# Patient Record
Sex: Female | Born: 1955 | Race: White | Hispanic: No | Smoking: Former smoker
Health system: Southern US, Community
[De-identification: ages and names within clinical notes are randomized; demographics above are authoritative.]

## PROBLEM LIST (undated history)

## (undated) DIAGNOSIS — I313 Pericardial effusion (noninflammatory): Secondary | ICD-10-CM

## (undated) DIAGNOSIS — I3139 Other pericardial effusion (noninflammatory): Secondary | ICD-10-CM

## (undated) DIAGNOSIS — E785 Hyperlipidemia, unspecified: Secondary | ICD-10-CM

## (undated) DIAGNOSIS — I219 Acute myocardial infarction, unspecified: Secondary | ICD-10-CM

## (undated) DIAGNOSIS — I10 Essential (primary) hypertension: Secondary | ICD-10-CM

## (undated) DIAGNOSIS — E039 Hypothyroidism, unspecified: Secondary | ICD-10-CM

## (undated) DIAGNOSIS — I4891 Unspecified atrial fibrillation: Principal | ICD-10-CM

## (undated) DIAGNOSIS — J449 Chronic obstructive pulmonary disease, unspecified: Secondary | ICD-10-CM

## (undated) DIAGNOSIS — S22000A Wedge compression fracture of unspecified thoracic vertebra, initial encounter for closed fracture: Secondary | ICD-10-CM

## (undated) DIAGNOSIS — E119 Type 2 diabetes mellitus without complications: Secondary | ICD-10-CM

## (undated) DIAGNOSIS — I5032 Chronic diastolic (congestive) heart failure: Secondary | ICD-10-CM

## (undated) DIAGNOSIS — K439 Ventral hernia without obstruction or gangrene: Secondary | ICD-10-CM

## (undated) HISTORY — DX: Chronic diastolic (congestive) heart failure: I50.32

## (undated) HISTORY — PX: TRACHEAL SURGERY: SHX1096

## (undated) HISTORY — PX: HERNIA REPAIR: SHX51

## (undated) HISTORY — PX: ABDOMINAL HYSTERECTOMY: SHX81

## (undated) HISTORY — PX: ABDOMINAL SURGERY: SHX537

## (undated) HISTORY — PX: STOMACH SURGERY: SHX791

---

## 2002-01-21 ENCOUNTER — Encounter: Payer: Self-pay | Admitting: Internal Medicine

## 2002-01-22 ENCOUNTER — Inpatient Hospital Stay (HOSPITAL_COMMUNITY): Admission: EM | Admit: 2002-01-22 | Discharge: 2002-01-24 | Payer: Self-pay | Admitting: Internal Medicine

## 2002-01-22 ENCOUNTER — Encounter: Payer: Self-pay | Admitting: Internal Medicine

## 2003-05-03 ENCOUNTER — Encounter: Payer: Self-pay | Admitting: Obstetrics & Gynecology

## 2003-05-03 ENCOUNTER — Ambulatory Visit (HOSPITAL_COMMUNITY): Admission: RE | Admit: 2003-05-03 | Discharge: 2003-05-03 | Payer: Self-pay | Admitting: Obstetrics & Gynecology

## 2003-10-30 ENCOUNTER — Inpatient Hospital Stay (HOSPITAL_COMMUNITY): Admission: RE | Admit: 2003-10-30 | Discharge: 2003-11-01 | Payer: Self-pay | Admitting: Obstetrics & Gynecology

## 2003-11-14 ENCOUNTER — Inpatient Hospital Stay (HOSPITAL_COMMUNITY): Admission: RE | Admit: 2003-11-14 | Discharge: 2003-11-15 | Payer: Self-pay | Admitting: Obstetrics & Gynecology

## 2007-04-06 ENCOUNTER — Observation Stay (HOSPITAL_COMMUNITY): Admission: AD | Admit: 2007-04-06 | Discharge: 2007-04-07 | Payer: Self-pay | Admitting: General Surgery

## 2007-10-03 ENCOUNTER — Ambulatory Visit (HOSPITAL_COMMUNITY): Admission: RE | Admit: 2007-10-03 | Discharge: 2007-10-03 | Payer: Self-pay | Admitting: Internal Medicine

## 2008-08-20 ENCOUNTER — Emergency Department (HOSPITAL_COMMUNITY): Admission: EM | Admit: 2008-08-20 | Discharge: 2008-08-20 | Payer: Self-pay | Admitting: Emergency Medicine

## 2008-08-27 ENCOUNTER — Ambulatory Visit (HOSPITAL_COMMUNITY): Admission: RE | Admit: 2008-08-27 | Discharge: 2008-08-27 | Payer: Self-pay | Admitting: General Surgery

## 2008-10-04 ENCOUNTER — Ambulatory Visit (HOSPITAL_COMMUNITY): Admission: RE | Admit: 2008-10-04 | Discharge: 2008-10-04 | Payer: Self-pay | Admitting: Internal Medicine

## 2009-10-07 ENCOUNTER — Ambulatory Visit (HOSPITAL_COMMUNITY): Admission: RE | Admit: 2009-10-07 | Discharge: 2009-10-07 | Payer: Self-pay | Admitting: Internal Medicine

## 2010-09-02 ENCOUNTER — Ambulatory Visit (HOSPITAL_COMMUNITY): Admission: RE | Admit: 2010-09-02 | Discharge: 2010-09-02 | Payer: Self-pay | Admitting: Internal Medicine

## 2010-09-23 ENCOUNTER — Ambulatory Visit: Payer: Self-pay | Admitting: Internal Medicine

## 2010-09-23 DIAGNOSIS — J449 Chronic obstructive pulmonary disease, unspecified: Secondary | ICD-10-CM | POA: Insufficient documentation

## 2010-09-23 DIAGNOSIS — L03319 Cellulitis of trunk, unspecified: Secondary | ICD-10-CM

## 2010-09-23 DIAGNOSIS — F329 Major depressive disorder, single episode, unspecified: Secondary | ICD-10-CM | POA: Insufficient documentation

## 2010-09-23 DIAGNOSIS — J45909 Unspecified asthma, uncomplicated: Secondary | ICD-10-CM | POA: Insufficient documentation

## 2010-09-23 DIAGNOSIS — E039 Hypothyroidism, unspecified: Secondary | ICD-10-CM | POA: Insufficient documentation

## 2010-09-23 DIAGNOSIS — I10 Essential (primary) hypertension: Secondary | ICD-10-CM | POA: Insufficient documentation

## 2010-09-23 DIAGNOSIS — E119 Type 2 diabetes mellitus without complications: Secondary | ICD-10-CM | POA: Insufficient documentation

## 2010-09-23 DIAGNOSIS — R109 Unspecified abdominal pain: Secondary | ICD-10-CM | POA: Insufficient documentation

## 2010-09-23 DIAGNOSIS — J4489 Other specified chronic obstructive pulmonary disease: Secondary | ICD-10-CM | POA: Insufficient documentation

## 2010-09-23 DIAGNOSIS — M549 Dorsalgia, unspecified: Secondary | ICD-10-CM | POA: Insufficient documentation

## 2010-09-23 DIAGNOSIS — L02219 Cutaneous abscess of trunk, unspecified: Secondary | ICD-10-CM

## 2010-09-24 ENCOUNTER — Encounter: Payer: Self-pay | Admitting: Internal Medicine

## 2010-09-26 ENCOUNTER — Encounter: Payer: Self-pay | Admitting: Gastroenterology

## 2010-10-09 ENCOUNTER — Ambulatory Visit (HOSPITAL_COMMUNITY): Admission: RE | Admit: 2010-10-09 | Discharge: 2010-10-09 | Payer: Self-pay | Admitting: Internal Medicine

## 2010-12-09 ENCOUNTER — Emergency Department (HOSPITAL_COMMUNITY)
Admission: EM | Admit: 2010-12-09 | Discharge: 2010-12-09 | Payer: Self-pay | Source: Home / Self Care | Admitting: Emergency Medicine

## 2010-12-10 LAB — URINALYSIS, ROUTINE W REFLEX MICROSCOPIC
Bilirubin Urine: NEGATIVE
Hgb urine dipstick: NEGATIVE
Ketones, ur: NEGATIVE mg/dL
Nitrite: NEGATIVE
Protein, ur: NEGATIVE mg/dL
Specific Gravity, Urine: 1.01 (ref 1.005–1.030)
Urine Glucose, Fasting: NEGATIVE mg/dL
Urobilinogen, UA: 0.2 mg/dL (ref 0.0–1.0)
pH: 6 (ref 5.0–8.0)

## 2010-12-10 LAB — CBC
HCT: 40.2 % (ref 36.0–46.0)
Hemoglobin: 13.7 g/dL (ref 12.0–15.0)
MCH: 25.8 pg — ABNORMAL LOW (ref 26.0–34.0)
MCHC: 34.1 g/dL (ref 30.0–36.0)
MCV: 75.8 fL — ABNORMAL LOW (ref 78.0–100.0)
Platelets: 210 10*3/uL (ref 150–400)
RBC: 5.3 MIL/uL — ABNORMAL HIGH (ref 3.87–5.11)
RDW: 16.6 % — ABNORMAL HIGH (ref 11.5–15.5)
WBC: 10.1 10*3/uL (ref 4.0–10.5)

## 2010-12-10 LAB — DIFFERENTIAL
Basophils Absolute: 0.1 10*3/uL (ref 0.0–0.1)
Basophils Relative: 1 % (ref 0–1)
Eosinophils Absolute: 0.2 10*3/uL (ref 0.0–0.7)
Eosinophils Relative: 2 % (ref 0–5)
Lymphocytes Relative: 15 % (ref 12–46)
Lymphs Abs: 1.6 10*3/uL (ref 0.7–4.0)
Monocytes Absolute: 0.7 10*3/uL (ref 0.1–1.0)
Monocytes Relative: 6 % (ref 3–12)
Neutro Abs: 7.6 10*3/uL (ref 1.7–7.7)
Neutrophils Relative %: 76 % (ref 43–77)

## 2010-12-10 LAB — BASIC METABOLIC PANEL
BUN: 17 mg/dL (ref 6–23)
CO2: 25 mEq/L (ref 19–32)
Calcium: 9.2 mg/dL (ref 8.4–10.5)
Chloride: 100 mEq/L (ref 96–112)
Creatinine, Ser: 0.79 mg/dL (ref 0.4–1.2)
GFR calc Af Amer: >60 mL/min (ref >60)
GFR calc non Af Amer: >60 mL/min (ref >60)
Glucose, Bld: 124 mg/dL — ABNORMAL HIGH (ref 70–99)
Potassium: 4.7 mEq/L (ref 3.5–5.1)
Sodium: 135 mEq/L (ref 135–145)

## 2010-12-10 LAB — POCT CARDIAC MARKERS
CKMB, poc: 1.1 ng/mL (ref 1.0–8.0)
CKMB, poc: 1 ng/mL (ref 1.0–8.0)
Myoglobin, poc: 50.7 ng/mL (ref 12–200)
Myoglobin, poc: 63.5 ng/mL (ref 12–200)
Troponin i, poc: <0.05 ng/mL (ref 0.00–0.09)
Troponin i, poc: <0.05 ng/mL (ref 0.00–0.09)

## 2010-12-10 LAB — D-DIMER, QUANTITATIVE: D-Dimer, Quant: 2.07 ug/mL-FEU — ABNORMAL HIGH (ref 0.00–0.48)

## 2010-12-23 NOTE — Assessment & Plan Note (Signed)
Summary: DRAINAGE FROM INCISION(HERNIA REPAIR)   Visit Type:  Initial Consult Referring Provider:  Dr Sherwood Gambler Primary Care Provider:  Dr Sherwood Gambler  Chief Complaint:  drainage from incision from 2008 surgery.  History of Present Illness: 55 y/o caucasian female w/ 2 month hx of abd pain at site of previous ventral hernia repair.  c/o increasin abd girth, stretch  marks & serosanguinous & yellowish purulent drainage.  Was started on bactrim two times a day x 10 days in 9/11.  Hx recurrent incisional hernia repair 04/06/2007 Dr Lovell Sheehan, followed by re-do in 08/27/2008.  Never had colonscopy.  c/o intermittant "sticking" sharp & dull pain.  9/10, lasts 20-30 min.  c/o nausea without emesis & needs to urinate w/ episodes.  Episodes q couple hrs throughout every day.   BM 1-2 per day or every other day.  Denies rectal bleeding or melena.  Denies fever or chills.  Occ heartburn, takes prevacid as needed 3/month.    Worse w/ spicy foods.  Denies dysphagia or odynophagia. Blood sugars well-controlled.   See CT below.... CT abd/pelvis w/ oral/IV contrast-> Scattered atherosclerotic calcifications aorta and branch vessels, scattered normal-sized periportal and right cardiophrenic angle lymph nodes, Ventral hernia seen on previous exam appears slightly larger on   current study and contains nonobstructed small bowel loops as well as margin of transverse colon.  Small fluid collection identified external to the hernia sac in the anterior abdominal wall in the midline, appears to extend to skin surface infraumbilical (3.4 x 2.1 cm axial dimensions),   appears contiguous with a thin collection 11 mm thick and 8.6 cm diameter as it courses superiorly. Demonstrates an enhancing margin and is slightly larger versus prior study.    Current Problems (verified): 1)  COPD  (ICD-496) 2)  Back Pain, Chronic  (ICD-724.5) 3)  Depression  (ICD-311) 4)  Dm  (ICD-250.00) 5)  Hypertension  (ICD-401.9) 6)  Hypothyroidism   (ICD-244.9) 7)  Asthma  (ICD-493.90) 8)  Abdominal Pain  (ICD-789.00)  Current Medications (verified): 1)  Altace 10 Mg Caps (Ramipril) .... Once Daily 2)  Synthroid 112 Mcg Tabs (Levothyroxine Sodium) .... Once Daily 3)  Zocor 10 Mg Tabs (Simvastatin) .... Once Daily 4)  Albuterol Sulfate 4 Mg Tabs (Albuterol Sulfate) .... Once Daily 5)  Xanax 1 Mg Tabs (Alprazolam) .... Q 3- 4 Hours As Needed 6)  Theophylline Cr 300 Mg Xr12h-Tab (Theophylline) .... Two Times A Day 7)  Proventil Hfa 108 (90 Base) Mcg/act Aers (Albuterol Sulfate) .... Q 4 Hours As Needed 8)  Allegra-D Allergy & Congestion 60-120 Mg Xr12h-Tab (Fexofenadine-Pseudoephedrine) .... Once Daily 9)  Hydrocodone-Acetaminophen 10-500 Mg Tabs (Hydrocodone-Acetaminophen) .... Q 4 Hours As Needed  Allergies (verified): 1)  ! Pcn  Past History:  Past Medical History: BACK PAIN, CHRONIC (ICD-724.5) DEPRESSION (ICD-311) DM (ICD-250.00) HYPERTENSION (ICD-401.9) HYPOTHYROIDISM (ICD-244.9) ASTHMA/COPD  Past Surgical History: complete hysterectomy 2004 ventral hernia x 2 (see HPI) s/p wound dehiss 2004  Family History: Father: (deceased ) ? unsure Mother: (deceased 11) DM CAD Siblings:  1 brother w/ metastatic ca ?e tiology (deceased 34) 1 brother SLE 1 sister DM. CVA, asthma  Social History: unemployed tobacco 60pkyr hx (1 1/2 pk/day) Alcohol Use - yes, 2-3- 12oz bud light Illicit Drug Use - no Patient gets regular exercise, walksDoes Patient Exercise:  yes Drug Use:  no  Review of Systems General:  Complains of sleep disorder; denies fever, chills, sweats, anorexia, fatigue, weakness, malaise, and weight loss. CV:  Denies chest pains, angina, palpitations, syncope,  dyspnea on exertion, orthopnea, PND, peripheral edema, and claudication. Resp:  Complains of cough, sputum, and wheezing; hx asthma/COPD uses inhalers. GI:  Denies difficulty swallowing, pain on swallowing, nausea, vomiting blood, jaundice, change in  bowel habits, bloody BM's, black BMs, and fecal incontinence. GU:  Denies urinary burning, blood in urine, nocturnal urination, urinary frequency, and urinary incontinence. MS:  Denies joint pain / LOM, joint swelling, joint stiffness, joint deformity, low back pain, muscle weakness, muscle cramps, muscle atrophy, leg pain at night, leg pain with exertion, and shoulder pain / LOM hand / wrist pain (CTS). Derm:  Denies rash, itching, dry skin, hives, moles, warts, and unhealing ulcers. Psych:  Complains of depression; denies anxiety, memory loss, suicidal ideation, hallucinations, paranoia, phobia, and confusion; major depression since daughter died, talks w/ pastor, not on meds. Heme:  Denies bruising, bleeding, and enlarged lymph nodes.  Vital Signs:  Patient profile:   55 year old female Height:      63 inches Weight:      197 pounds BMI:     35.02 Temp:     97.9 degrees F oral Pulse rate:   84 / minute BP sitting:   112 / 74  (left arm) Cuff size:   regular  Vitals Entered By: Hendricks Limes LPN (September 23, 2010 11:31 AM)  Physical Exam  General:  obese.  well nourished, no acute distress. Head:  Normocephalic and atraumatic. Eyes:  sclera clear, no icterus. Ears:  Normal auditory acuity. Nose:  No deformity, discharge,  or lesions. Mouth:  No deformity or lesions, dentition normal. Neck:  Supple; no masses or thyromegaly. Lungs:  insp/exp wheezes bilateral.  NAD Heart:  Regular rate and rhythm; no murmurs, rubs,  or bruits. Abdomen:  Just inferior to umbilicus and right of midline scar is scabbing around erythematous raised base.  No active d/c.  To the left of this lesion is a warm, firm, non-erythematous density that is moderately tender.normal bowel sounds, obese, without guarding, without rebound, and no hepatomegally or splenomegaly.   Msk:  Symmetrical with no gross deformities. Normal posture. Pulses:  Normal pulses noted. Extremities:  No clubbing, cyanosis, edema or  deformities noted. Neurologic:  Alert and  oriented x4;  grossly normal neurologically. Skin:  Intact without significant lesions or rashes. Cervical Nodes:  No significant cervical adenopathy. Psych:  Alert and cooperative. Normal mood and affect.  Impression & Recommendations:  Problem # 1:  ABDOMINAL PAIN (ICD-789.00) 55 y/o caucasian female w/ abdominal pain at the site of previous hernia repairs.  CT shows abscess/fluid collections.  Will have Dr Jena Gauss review CT films.  I suspect Ms. Skoda will need surgical referral.  She has never had a colonoscopy & will need this as well.  Problem # 2:  ABDOMINAL ABSCESS (ICD-682.2) See #1  Other Orders: Consultation Level IV (16109)  Patient Instructions: 1)  Referral to Mental Health regarding depression  *Spent 1 hr with patient during office visit reviewing CT, discussing plan, advising referral regarding depression  Appended Document: DRAINAGE FROM INCISION(HERNIA REPAIR) RMR reviewed CT w/ Dr Onalee Hua Call (radiologist).  Recommends surgical referral Dr Derrell Lolling [facility]) re: recurrent ventral hernia w/ abscess.  Please arrange.  Thanks  Appended Document: DRAINAGE FROM INCISION(HERNIA REPAIR) I spoke with pt about the ability to pay some money up front for surgical visit.She said she is unable to pay any money up front.Marland KitchenMarland KitchenI spoke with Blanca([facility]) about patient's financial situation and she stated she would speak to their billing department  and get back to me.  Appended Document: DRAINAGE FROM INCISION(HERNIA REPAIR) Surgical referral faxed to Dr Dixon Boos office..Pt has an appt. 09/25/10@3 :00.Marland KitchenMarland KitchenPt aware of appt.

## 2010-12-23 NOTE — Letter (Signed)
Summary: RELEASE OF RECORDS TO DR WYATT  RELEASE OF RECORDS TO DR Lindie Spruce   Imported By: Diana Eves 09/26/2010 13:08:30  _____________________________________________________________________  External Attachment:    Type:   Image     Comment:   External Document

## 2010-12-23 NOTE — Letter (Signed)
Summary: OP REPORT EGD/2003  OP REPORT EGD/2003   Imported By: Rexene Alberts 09/24/2010 10:02:55  _____________________________________________________________________  External Attachment:    Type:   Image     Comment:   External Document

## 2011-04-07 NOTE — Op Note (Signed)
NAMEHAZYL, Joanna Perez              ACCOUNT NO.:  [id number]   MEDICAL RECORD NO.:  [id number]          PATIENT TYPE:  AMB   LOCATION:  DAY                           FACILITY:  [facility]   PHYSICIAN:  Dalia Heading, M.D.  DATE OF BIRTH:  Jun 29, 1956   DATE OF PROCEDURE:  08/27/2008  DATE OF DISCHARGE:                               OPERATIVE REPORT   PREOPERATIVE DIAGNOSIS:  Poorly healing abdominal wound, draining sinus.   POSTOPERATIVE DIAGNOSIS:  Poorly healing abdominal wound, draining  sinus.   PROCEDURE:  Abdominal wound exploration with debridement.   SURGEON:  Dalia Heading, MD   ANESTHESIA:  General endotracheal.   INDICATIONS:  The patient is a 55 year old white female status post an  incisional herniorrhaphy with mesh in May 2008, who now presents with  recurrence of a draining sinus in the lower portion of her abdominal  wound.  CT scan of the abdomen and pelvis done preoperatively reveals no  tracking of the sinus into the intra-abdominal cavity.  The patient now  comes to the operating room for a wound exploration.  The risks and  benefits of the procedure were fully explained to the patient, gave  informed consent.   PROCEDURE NOTE:  The patient was placed in the supine position.  After  general anesthesia was administered, the abdomen was prepped and draped  using the usual sterile technique with Betadine.  Surgical site  confirmation was performed.   An elliptical incision was made around the draining sinus along the  midline.  The sinus tract was then followed down to the mesh.  Several  sutures were present in this region and these were removed without  difficulty.  The mesh was noted to be intact.  No purulent fluid was  noted.  An aerobic culture was taken over the mesh and sent to  microbiology.  Any granulomatous tissue was debrided using a curette.  The wound was then copiously irrigated with gentamicin and normal  saline.  The patient had received  vancomycin preoperatively.  The  subcutaneous scar tissue was reapproximated using 2-0 Monocryl  interrupted sutures in order to cover the mesh.  The skin was closed  using 3-0 Prolene vertical mattress sutures.  Sensorcaine 0.5% was  instilled into the surrounding wound.  Betadine ointment and dry sterile  dressings were applied.   All tape and needle counts were correct at the end of the procedure.  The patient was awakened and transferred to PACU in stable condition.   COMPLICATIONS:  None.   SPECIMEN:  None.   BLOOD LOSS:  Minimal.      Dalia Heading, M.D.  Electronically Signed     MAJ/MEDQ  D:  08/27/2008  T:  08/27/2008  Job:  [id number]   cc:   Madelin Rear. Sherwood Gambler, MD  Fax: [phone]

## 2011-04-07 NOTE — H&P (Signed)
Joanna Perez, Joanna Perez              ACCOUNT NO.:  [id number]   MEDICAL RECORD NO.:  [id number]          PATIENT TYPE:  AMB   LOCATION:  DAY                           FACILITY:  [facility]   PHYSICIAN:  Dalia Heading, M.D.  DATE OF BIRTH:  [birth year]   DATE OF ADMISSION:  04/06/2007  DATE OF DISCHARGE:  LH                              HISTORY & PHYSICAL   CHIEF COMPLAINT:  Recurrent incisional hernia.   HISTORY OF PRESENT ILLNESS:  The patient is a 55 year old old white  female who is referred for evaluation treatment of a recurrent  incisional hernia.  She started having pain and swelling to the right of  her lower midline incision last month.  No nausea or vomiting have been  noted.  The hernia is made worse with straining.  She is status post  incisional herniorrhaphy in 2004.   PAST MEDICAL HISTORY:  1. COPD.  2. Hypertension.  3. Non-insulin-dependent diabetes mellitus.   PAST SURGICAL HISTORY:  As noted above, hysterectomy.   CURRENT MEDICATIONS:  1. Altace 10 mg p.o. daily.  2. Actos 30 mg p.o. daily.  3. Theo-Dur 300 mg p.o. daily.  4. Lipitor 10 mg p.o. daily.  5. Vicodin p.r.n. pain.  6. Xanax.  7. Synthroid.   ALLERGIES:  No known drug allergies.   REVIEW OF SYSTEMS:  The patient continues to smoke half pack cigarettes  a day.  She denies any alcohol use.  She denies any other  cardiopulmonary difficulties or bleeding disorders.   PHYSICAL EXAMINATION:  GENERAL:  The patient is a well-developed, well-  nourished white female in no acute distress.  LUNGS:  Clear to auscultation with equal breath sounds bilaterally.  HEART:  Reveals regular rate and rhythm without S3, S4, or murmurs.  ABDOMEN:  Soft, nondistended.  She has some tenderness noted along the  right side of lower midline incision.  A large incisional hernia is  present to the right upper lobe midline incision.   IMPRESSION:  Recurrent incisional hernia.   PLAN:  The patient is scheduled for a  recurrent incisional herniorrhaphy  with mesh on Apr 06, 2007.  The risks and benefits of the procedure  including bleeding, infection, and the possibility of recurrence of the  hernia were fully explained to the patient, gave informed consent.      Dalia Heading, M.D.  Electronically Signed     MAJ/MEDQ  D:  03/31/2007  T:  04/01/2007  Job:  [id number]   cc:   Madelin Rear. Sherwood Gambler, MD  Fax: [phone]

## 2011-04-07 NOTE — Op Note (Signed)
Joanna Perez, Joanna Perez              ACCOUNT NO.:  [id number]   MEDICAL RECORD NO.:  [id number]          PATIENT TYPE:  INP   LOCATION:  A304                          FACILITY:  [facility]   PHYSICIAN:  Dalia Heading, M.D.  DATE OF BIRTH:  10-16-1956   DATE OF PROCEDURE:  04/06/2007  DATE OF DISCHARGE:                               OPERATIVE REPORT   PREOPERATIVE DIAGNOSIS:  Recurrent incisional hernia.   POSTOPERATIVE DIAGNOSIS:  Recurrent incisional hernia.   PROCEDURE:  Recurrent incisional herniorrhaphy with mesh.   SURGEON:  Dalia Heading, MD   ANESTHESIA:  General endotracheal.   INDICATIONS:  The patient is a 55 year old white female who presents  with a recurrent incisional hernia along the lower midline incision.  She last had incisional herniorrhaphy with mesh in 2004.  The risks and  benefits of the procedure including bleeding, infection, and recurrence  of the hernia, were fully explained to the patient, who gave informed  consent.   PROCEDURE NOTE:  The patient was placed in the supine position.  After  induction of general endotracheal anesthesia, the abdomen was prepped  and draped using the usual sterile technique with Betadine.  Surgical  site confirmation was performed.   The previous lower midline surgical scar was excised without difficulty.  The dissection was taken down to the hernia sac.  A hernia sac was noted  to be emanating along the right side of the previous repair.  It  appeared that the previously-placed mesh was intact and had just  separated from the right rectus muscle.  The hernia sac was freed away  from the subcutaneous tissue down to the fascial edge.  It was then  inverted after several small bowel adhesions were lysed without  difficulty.  It was sewn back together using 0 chromic gut running  suture.  A 3 x 6 piece of polypropylene mesh was then secured to the  right rectus muscle and to the left where the previous mesh was placed  and  secured using both a running 0 Novofil suture as well as interrupted  0 Prolene sutures.  A tension-free repair was noted.  The subcutaneous  layer was reapproximated using 2-0 Vicryl interrupted sutures.  The skin  was closed using 3-0 Vicryl interrupted sutures and staples.  Sensorcaine 0.5% was instilled into the surrounding wound.  Betadine  ointment and a dry sterile dressing were applied.   All tape and needle counts were correct and the end of the procedure.  The patient was extubated in the operating room went back to recovery  room awake in stable condition.   COMPLICATIONS:  None.   SPECIMEN:  None.   BLOOD LOSS:  Minimal.      Dalia Heading, M.D.  Electronically Signed     MAJ/MEDQ  D:  04/06/2007  T:  04/06/2007  Job:  [id number]   cc:   Madelin Rear. Sherwood Gambler, MD  Fax: [phone]

## 2011-04-07 NOTE — H&P (Signed)
Joanna Perez, Joanna Perez              ACCOUNT NO.:  [id number]   MEDICAL RECORD NO.:  [id number]          PATIENT TYPE:  AMB   LOCATION:  DAY                           FACILITY:  [facility]   PHYSICIAN:  Dalia Heading, M.D.  DATE OF BIRTH:  1956/10/04   DATE OF ADMISSION:  DATE OF DISCHARGE:  LH                              HISTORY & PHYSICAL   CHIEF COMPLAINT:  Poorly healing wound, fistula.   HISTORY OF PRESENT ILLNESS:  The patient is a 55 year old white female  status post a recurrent incisional herniorrhaphy in 2008 who now  presents with recurrent draining sinus in the lower portion of her  wound.  She was seen in emergency room 2 days ago and was found to have  fluid collection in the subcutaneous tissue.  This did not extend into  the abdomen.  She has had problems with his wound in the past closing.  It reopened several months ago.   PAST MEDICAL HISTORY:  COPD, hypertension, non-insulin-dependent  diabetes mellitus.   PAST SURGICAL HISTORY:  As noted above, incisional herniorrhaphy in  2004, hysterectomy.   CURRENT MEDICATIONS:  Actos, levothyroxine, Lipitor, theophylline,  Allegra, Proventil, alprazolam, furosemide.   ALLERGIES:  No known drug allergies.   REVIEW OF SYSTEMS:  The patient continues to smoke daily.  She denies  any alcohol use.  She denies any other cardiopulmonary difficulties or  bleeding disorders.   PHYSICAL EXAMINATION:  GENERAL:  The patient is a well-developed, well-  nourished white female in no acute distress.  LUNGS:  Clear to auscultation with equal breath sounds bilaterally.  HEART:  Regular rate and rhythm without S3, S4, and murmurs.  ABDOMEN:  Soft, nontender, nondistended.  A draining sinus is noted in the lower portion of her lower midline  incision.  Serosanguineous drainage is noted.   IMPRESSION:  Poorly healing wound, draining sinus.   PLAN:  The patient is scheduled for wound exploration of the abdominal  wall on August 27, 2008.   Risks and benefits of the procedure including  bleeding, infection, and recurrence of the wound problems were fully  explained to the patient, gave informed consent.      Dalia Heading, M.D.  Electronically Signed     MAJ/MEDQ  D:  08/23/2008  T:  08/24/2008  Job:  [id number]   cc:   Madelin Rear. Sherwood Gambler, MD  Fax: [phone]

## 2011-04-10 NOTE — H&P (Signed)
NAME:  Joanna Perez, Joanna Perez                        ACCOUNT NO.:  [id number]   MEDICAL RECORD NO.:  [id number]                   PATIENT TYPE:  OBV   LOCATION:  336                                  FACILITY:  [facility]   PHYSICIAN:  Lazaro Arms, M.D.                DATE OF BIRTH:  [birth year]   DATE OF ADMISSION:  DATE OF DISCHARGE:                                HISTORY & PHYSICAL   HISTORY OF PRESENT ILLNESS:  The patient is a 55 year old white female,  gravida 2, para 2, abortus 0 status post a TAH&BSO on October 30, 2003.  The  patient was discharged on November 01, 2003.  Also, she had an intraoperative  repair of an incisional hernia.   Her health is compromised by fairly severe COPD and asthma. She had very  high flow rates intraoperatively and had borderline saturation rates  postoperatively, even with O2 on. The patient underwent aggressive  respiratory toilet with respiratory therapy treatments.  She went home on  postoperative day #2, doing well. She has been seen in the office as  scheduled postoperatively. On the 13th, we took out half of her staples for  her vertical incision and her JP was putting out about 50-75 cc of serous,  pink drainage a day. As a result, we kept her JP in place.   She was to come back today for the removal of the rest of her staples, and  the patient stated she woke up this morning with back pain and had been  doing allot of coughing.  She coughed very vigorously at approximately 1:30  and said she had a big gush of fluid from her incision. She comes into the  office a couple of hours early and is found to have a wound dehiscence.  The  rest of the staples were removed, the JP-drain was removed.  Sterile ABDs  are moistened and placed over the incision and preparations were made for  return to the operating room for a secondary repair of a wound dehiscence.   PAST MEDICAL HISTORY:  1. COPD/asthma.  2. Hypertension.  3. Hypercholesterolemia.   PAST  SURGICAL HISTORY:  1. Tubal ligation.  2. TAH&BSO with umbilical hernia repair.   PAST OBSTETRICAL HISTORY:  Two vaginal deliveries.   MEDICATIONS:  1. Theo-Dur 300 mg twice a day.  2. Zestril 120 mg a day.  3. Synthroid 112 mcg a day.  4. Albuterol inhaler as needed with a home nebulizer.   REVIEW OF SYSTEMS:  As per the HPI, otherwise  negative.   PATHOLOGY REPORT:  This returned benign, as noted.   PHYSICAL EXAMINATION:  HEENT:  Unremarkable.  NECK:  Normal.  LUNGS:  Clear.  CARDIOVASCULAR:  Regular rate and rhythm, no murmurs, rubs or gallops.  BREASTS:  Deferred.  ABDOMEN:  Wound dehiscence.  It looks healthy tissue. There does not appear  to be any infection, there does  not appear to be any abscess at all.  Looks  just like a straight-forward breakdown of the tissues.  BACK:  There is paraspinous muscle tenderness bilaterally.  No evidence of  CVA tenderness or kidney stone.  PELVIC:  Deferred.  EXTREMITIES:  No edema.   IMPRESSION:  1. Postoperative wound dehiscence.  2. No evidence, at least in the office, of abscess or infection.  3. Chronic chronic obstructive pulmonary disease and chronic cough.   PLAN:  The patient was admitted for a secondary wound closure of the fascia.  I am going to leave the wound open otherwise for secondary closure at a  later date. The patient and her husband understand the indications for  admission and will proceed.     ___________________________________________                                         Lazaro Arms, M.D.   Loraine Maple  D:  11/12/2003  T:  11/12/2003  Job:  [id number]

## 2011-04-10 NOTE — H&P (Signed)
[facility]  Patient:    Joanna Perez, Joanna Perez Visit Number: [id number] MRN: [id number]          Service Type: MED Location: 3A A313 01 Attending Physician:  Cassell Smiles. Dictated by:   Carylon Perches, M.D. Admit Date:  01/21/2002   CC:         Kari Baars, M.D.  Franky Macho, M.D.   History and Physical  CHIEF COMPLAINT:  Upper abdominal pain.  HISTORY OF PRESENT ILLNESS:  This patient is a 55 year old white female, with asthma, who presented to the emergency room with upper abdominal pain.  Her pain was localized more to the left upper quadrant.  This began at approximately 12:30 p.m. on the day prior to admission.  Her pain was worse when she moved.  Eating did not make it worse.  She denied nausea, vomiting, or diarrhea.  She had had normal bowel movements, with no bleeding.  She relates a history of an ulcer, though, in her teens.  She has had a tubal ligation but no other abdominal surgery.  She was evaluated in the emergency room and found to have an elevated WBC at 20.2.  A CT scan of the abdomen revealed no definitive source of her pain.  PAST MEDICAL HISTORY:  1. Tubal ligation.  2. Peptic ulcer disease.  3. Asthma.  4. Hypothyroidism.  5. Hypertension.  6. Hyperlipidemia.  MEDICATIONS:  1. Theophylline 300 mg t.i.d.  2. Synthroid 112 mcg q.d.  3. Lipitor 10 mg q.d.  4. Albuterol two puffs q.4h as needed.  5. Serevent two puffs b.i.d.  6. Flovent, unknown dose, b.i.d.  7. Zestril 20 mg q.d.  ALLERGIES:  1. PENICILLIN.  2. CIPRO.  3. CODEINE.  4. KEFLEX.  5. E-MYCIN.  SOCIAL HISTORY:  She quit smoking five months ago.  She does not use alcohol or drugs.  FAMILY HISTORY:  Her mother has had bypass surgery at 95.  Her father died of an MI.  REVIEW OF SYSTEMS:  She has had no difficulty eating.  No problems with urination or defecation.  She denies having felt short of breath with this. Her asthma has generally been  stable over time.  The ER sheet, though, notes that she had shortness of breath on presentation.  PHYSICAL EXAMINATION:  VITAL SIGNS:  Temperature 98.7 degrees initially, with a high of 100.3 degrees in the ER.  Pulse 112, respirations 24, blood pressure 164/73.  Oxygen saturation 95% on room air.  GENERAL:  Uncomfortable, overweight white female.  HEENT:  No scleral icterus.  Pharynx unremarkable.  NECK:  No JVD or thyromegaly.  LUNGS:  Bilateral wheezes.  HEART:  Tachycardic with no audible murmurs.  ABDOMEN:  Tender in the left upper quadrant.  No palpable mass or hepatosplenomegaly.  EXTREMITIES:  Pulses intact.  No clubbing, cyanosis, or edema.  NEUROLOGIC:  Grossly intact.  LABORATORY DATA:  Lipase 25, amylase 33.  WBC 20.2, hemoglobin 13.5, platelets 280,000; 83% segs, 12% lymphs.  D-dimer less than 0.25.  CPK 89.  Urinalysis reveals trace hemoglobin and trace ketones.  Theophylline level 5.5.  Sodium 134, potassium 4.1, bicarbonate 29, glucose 201, BUN 15, creatinine 0.8, albumin 3.3, SGOT 16, SGPT 17, bilirubin 0.4.  EKG reveals sinus tachycardia without ischemic changes.  Chest x-ray revealed linear atelectasis.  Abdominal CT scan reveals a nonobstructing left mid renal calculus, probable right renal cyst, and no evidence of diverticulitis.  IMPRESSION:  1. Left upper quadrant pain, leukocytosis, low-grade fever, etiology unclear  at this point.  CT scan does not reveal a definite source.  She was     treated with IV Levaquin in the ER along with Demerol and Vistaril.  Will     obtain a surgical consultation.  2. Hyperglycemia.  She does not have a history of diabetes.  Will follow with     hemoglobin and hematocrit and Accu-Cheks and will treat with sliding-scale     insulin if needed.  3. Asthma.  Treat with albuterol nebulizer treatments and continue Flovent.  4. Hypothyroidism.  5. Hyperlipidemia.  6. Hypertension.  7. Status post tubal ligation.  Her  last period ended one week ago. Dictated by:   Carylon Perches, M.D. Attending Physician:  Cassell Smiles DD:  01/22/02 TD:  01/22/02 Job: [id number]

## 2011-04-10 NOTE — Op Note (Signed)
NAME:  Joanna Perez, Joanna Perez                        ACCOUNT NO.:  [id number]   MEDICAL RECORD NO.:  [id number]                   PATIENT TYPE:  OBV   LOCATION:  A336                                 FACILITY:  [facility]   PHYSICIAN:  Lazaro Arms, M.D.                DATE OF BIRTH:  October 21, 1956   DATE OF PROCEDURE:  DATE OF DISCHARGE:                                 OPERATIVE REPORT   PREOPERATIVE DIAGNOSES:  1. Postoperative fascial dehiscence.  2. Status post a total abdominal hysterectomy, bilateral salpingo-     oophorectomy and repair of incisional umbilical hernia on October 30, 2003.   POSTOPERATIVE DIAGNOSES:  1. Postoperative fascial dehiscence.  2. Status post a total abdominal hysterectomy, bilateral salpingo-     oophorectomy and repair of incisional umbilical hernia on October 30, 2003.   PROCEDURE:  1. Wound and intraperitoneal exploration.  2. Repair of incisional herniorrhaphy.   SURGEON:  Dalia Heading, M.D.   ASSISTANT:  Lazaro Arms, M.D.   ANESTHESIA:  General endotracheal.   FINDINGS:  The patient was seen in the office today.  She came in because  she had a coughing spell and had a big gush of fluid and immediate pain.  She came in and had an obvious fascial dehiscence.  I took her to the OR.  She had no evidence of infection.  There was no microabscess or gross  abscess intraperitoneal or in the wound.  I did do some trimming back of the  subcu fat because of induration, but really the wound was very clean.  I  called Dr. Lovell Sheehan in, at that point, to evaluate for primary mesh  placement.   DESCRIPTION OF OPERATION:  The patient was taken to the operating room and  placed in the supine position where she underwent general endotracheal  anesthesia.  She was prepped and draped in the usual sterile fashion.  A  Foley catheter had been placed in her bladder.  Copious irrigation was used.  Some adhesions of the small bowel were taken down to the  anterior abdominal  wall and to the omentum.  These were taken down bluntly and sharply.  The  bowel was not injured. There was no evidence of any infection in the fascia  or the peritoneal cavity.  It was a true fascial dehiscence.  The skin was  even intact at the upper portion of the incision.   As a result, after fully evaluating the incision, I felt, as if I could not  pull the fascia back together without putting a lot of tension on it.  I  called Dr. Lovell Sheehan in and, he felt, that it was appropriate to proceed with  a polypropylene mesh placement.  Dr. Lovell Sheehan dictation will give details of  that.  We then placed the polypropylene mesh; closed the fascia; put a JP  drain  in; and closed the skin with staples and retention sutures.   The patient tolerated the procedure well.  She experienced minimal to no  blood loss.  She was taken to the recovery room in good condition.  All  counts were correct.  She received Levaquin prophylactically.      ___________________________________________                                            Lazaro Arms, M.D.   Loraine Maple  D:  11/12/2003  T:  11/13/2003  Job:  [id number]

## 2011-04-10 NOTE — Op Note (Signed)
NAMEGLADYCE, Joanna Perez                        ACCOUNT NO.:  [id number]   MEDICAL RECORD NO.:  [id number]                   PATIENT TYPE:  INP   LOCATION:  A420                                 FACILITY:  [facility]   PHYSICIAN:  Lazaro Arms, M.D.                DATE OF BIRTH:  06/15/1956   DATE OF PROCEDURE:  DATE OF DISCHARGE:                                 OPERATIVE REPORT   PREOPERATIVE DIAGNOSES:  1. Right adnexal mass.  2. Pelvic pain.  3. Umbilical hernia.   POSTOPERATIVE DIAGNOSES:  1. Right adnexal mass.  2. Pelvic pain.  3. Umbilical hernia.   PROCEDURE:  1. Total abdominal hysterectomy with bilateral salpingo-oophorectomy.  2. Repair of umbilical hernia.   SURGEON:  Lazaro Arms, M.D.   ANESTHESIA:  General endotracheal.   FINDINGS:  The patient was noted to have a simple adnexal mass in the right  ovary.  A CA-125 had been normal.  She continued to have significant pain  from the right lower quadrant and painful period.  After managing her  conservatively for a year, she wanted to go forward with a TAH/BSO.  She  also had an obvious incisional hernia of the umbilicus, status post tubal  ligation.   DESCRIPTION OF OPERATION:  The patient was taken to the operating room and  placed in the supine position where she underwent general endotracheal  anesthesia.  She was prepped and draped in the usual sterile fashion  including the vagina.  A Foley catheter was placed.  A vertical skin  incision was made and carried down sharply through the rectus fascia.  The  abdominal cavity was entered in the usual fashion.  A Balfour self-retaining  retractor was used.  The upper abdomen was packed away.   The uterine cornu were grasped. The right round ligament was suture ligated  and cut.  The infundibulopelvic ligament was doubly suture ligated and cut  bilaterally.  The uterine vessels were skeletonized.  The vesicouterine  serosal flap was created and pushed down off the  lower uterine segment.  The  uterine vessels were clamped, cut, and suture ligated.  Serial pedicles were  taken the cardinal ligament.  Each pedicle was clamped, cut, and suture  ligated.  The vagina was cross clamped and specimen was removed.  Vaginal  angle sutures were placed and the vagina was closed with interrupted figure-  of-eight sutures.  The pelvis was irrigated vigorously.  There was good  hemostasis of all pedicles.  The Bookwalter self-retaining retractor was  removed.   The umbilical hernia was closed with two #0 PDS sutures.  The subcutaneous  tissue, fascia, and peritoneal cavity were closed in a modified Smead-Jones  fashion using a looped #0 PDS without difficulty.  The subcutaneous tissue  was irrigated.  A JP drain was placed.  Sutures were placed in the subcu to  create more pressure and diminish seroma  formation.  The skin was closed  using skin staples.  The patient tolerated the procedure well.  She  experienced 150 cc of blood loss and was taken to the recovery room in good  stable condition.  All counts were correct.      ___________________________________________                                            Lazaro Arms, M.D.   LHE/MEDQ  D:  10/30/2003  T:  10/30/2003  Job:  [id number]

## 2011-04-10 NOTE — Consult Note (Signed)
[facility]  Patient:    Joanna Perez, Joanna Perez Visit Number: [id number] MRN: [id number]          Service Type: MED Location: 3A A313 01 Attending Physician:  Joanna Perez. Dictated by:   Joanna Perez, M.D. Proc. Date: 01/22/02 Admit Date:  01/21/2002   CC:         Joanna Perez, M.D.   Consultation Report  AGE:  55 years old  REASON FOR CONSULTATION:  Left upper quadrant abdominal pain.  REFERRING PHYSICIAN:  Dr. Carylon Perez  HISTORY OF PRESENT ILLNESS:  The patient is a 55 year old white female with a history of peptic ulcer disease in the remote past who presented to the emergency room yesterday with significant left upper quadrant abdominal pain which started yesterday afternoon.  The pain is so severe that it causes her to be somewhat short of breath.  She denies any nausea or vomiting.  She denies any diarrhea or constipation.  She denies any melena.  The left upper quadrant pain seems to be persistent.  She states she had a peptic ulcer diagnosed at the age of 50 and had gas pains approximately five years ago. She denies any significant alcohol use or nonsteroidal anti-inflammatory use.  PAST MEDICAL HISTORY: 1. Chronic obstructive pulmonary disease. 2. Hypothyroidism. 3. High cholesterol levels.  PAST SURGICAL HISTORY:  Unremarkable.  CURRENT MEDICATIONS:  Theophylline, Synthroid, Lipitor, albuterol inhalers, Zestril.  ALLERGIES:  PENICILLIN, CIPROFLOXACIN, CODEINE, KEFLEX, ERYTHROMYCIN.  PHYSICAL EXAMINATION:  GENERAL:  The patient is a 55 year old white female who is in moderate distress when moving on the bed.  VITAL SIGNS:  Temperature 99, vital signs stable.  ABDOMEN:  Soft with exquisite tenderness noted to palpation in the epigastric and left upper quadrant regions.  She does not have rigidity.  There is no referred pain noted.  No hepatosplenomegaly or masses are noted.  RECTAL:  Deferred at this time due to patient  discomfort.  LABORATORY DATA:  White blood cell count 20.2, hematocrit 40, platelet count 280.  Amylase and lipase are within normal limits.  D-dimer is negative. Theophylline level is 5.5.  Urinalysis is for the most part unremarkable. MET7 is within normal limits except for glucose of 201.  CT SCAN OF THE ABDOMEN/PELVIS:  Lingular atelectasis, nonobstructing left mid renal calculus, probable right renal cyst.  The gastrointestinal areas were within normal limits.  There was no evidence of free air or evidence of perforation.  IMPRESSION:  Left upper quadrant pain of unknown etiology, question gastritis, peptic ulcer disease.  There is no evidence of perforation at this time.  RECOMMENDATION: 1. I agree with continuing antibiotic therapy as well as Protonix. 2. An esophagogastroduodenoscopy will be performed in the morning.  This has    been discussed with the patient. 3. Repeat CBC will also be done in the morning. 4. This was discussed with Dr. Ouida Sills.  Thank you for consulting me on [name]. Dictated by:   Joanna Perez, M.D. Attending Physician:  Joanna Perez DD:  01/22/02 TD:  01/22/02 Job: [id number]

## 2011-04-10 NOTE — Discharge Summary (Signed)
[facility]  Patient:    Joanna Perez, Joanna Perez Visit Number: [id number] MRN: [id number]          Service Type: MED Location: 3A A313 01 Attending Physician:  Carylon Perches Dictated by:   Carylon Perches, M.D. Admit Date:  01/21/2002 Discharge Date: 01/24/2002   CC:         Kari Baars, M.D.  Dr. Lovell Sheehan   Discharge Summary  DISCHARGE DIAGNOSES: 1. Possible diverticulitis. 2. History of peptic ulcer disease. 3. Asthma. 4. Hypothyroidism. 5. Hypertension. 6. Hyperlipidemia. 7. Status post tubal ligation.  HOSPITAL COURSE:  This patient is a 55 year old white female who presented with left upper quadrant pain for 1 day.  Her white count was 20.2.  A CT scan of the abdomen revealed no evidence of abscess.  She did have a nonobstructing left mid renal calculus.  She was seen in consultation by Dr. Lovell Sheehan.  She underwent an upper endoscopy which revealed no evidence of ulcer.  Her white count improved to 14.7 and then to 12.8.  She was treated with Levaquin and Flagyl.  She was afebrile.  She was initially hyperglycemic with a glucose of 201.  Accu-Cheks the day prior to discharge though were 141, 113, 106, and 179.  This will be followed up further as an outpatient.  Her LFTs were normal.  Blood cultures were negative.  Her theophylline level was 5.5.  Her left upper quadrant discomfort improved.  She was felt to be stable for discharge on January 24, 2002, with continuation of oral outpatient antibiotic therapy.  She tolerated a soft diet well.  DISCHARGE MEDICATIONS: 1. Levaquin 500 mg q. day. 2. Flagyl 500 mg t.i.d. 3. Theo-Dur 300 mg b.i.d. 4. Synthroid 112 mcg q.d. 5. Lipitor 10 mg q.d. 6. Serevent 2 puffs b.i.d. 7. Albuterol 2 puffs q.4h. while awake. 8. Flovent 2 puffs b.i.d. 9. Zestril 20 mg q.d. Dictated by:   Carylon Perches, M.D. Attending Physician:  Carylon Perches DD:  01/24/02 TD:  01/24/02 Job: [id number]

## 2011-04-10 NOTE — Op Note (Signed)
NAMEMONTA, MAIORANA                        ACCOUNT NO.:  [id number]   MEDICAL RECORD NO.:  [id number]                   PATIENT TYPE:  OBV   LOCATION:  A336                                 FACILITY:  [facility]   PHYSICIAN:  Dalia Heading, M.D.               DATE OF BIRTH:  [birth year]   DATE OF PROCEDURE:  DATE OF DISCHARGE:                                 OPERATIVE REPORT   PREOPERATIVE DIAGNOSS:  Fascial dehiscence.   POSTOPERATIVE DIAGNOSS:  Fascial dehiscence.   PROCEDURE:  Incisional herniorrhaphy with mesh.   SURGEON:  Dalia Heading, M.D., Lazaro Arms, M.D.   ANESTHESIA:  General endotracheal.   INDICATIONS:  The patient is a 55 year old black female status post a  hysterectomy approximately 2 weeks ago who was brought to the operating room  by Dr. Despina Hidden for a fascial dehiscence.  At the time of surgery, the patient  was noted to have a fascial dehiscence.  There was no evidence of purulent  drainage.  This was probably secondary to the patient's body habitus as well  as her COPD.  Dr. Despina Hidden consulted me and asked me for my opinion concerning  closure.  As the fascial edges had retracted significantly, it was felt that  the patient would benefit most from herniorrhaphy with mesh.   PROCEDURE NOTE:  The patient was already intubated with the abdomen open.  The posterior fascial layer as well as peritoneum was closed using a running  #0 chromic gut suture.  A doubled polypropylene mesh was then secured to the  fascia using a running #0 Novofil running suture, as well as interrupted #0  Novofil sutures.  A tension-free repair was performed.  A #10 flat, Al Pimple drain was placed over the mesh and brought out through a separate stab  wound to the left of the lower midline incision. The mesh repair did  encompass the whole fascia from the umbilicus to the suprapubic region.  The  subcutaneous layer was reapproximated using #0 PDS interrupted sutures.  The  wound was  irrigated with normal saline.  The skin was closed using staples  and 2-0 nylon sutures.  Betadine ointment and dry sterile dressing were  applied. An abdominal binder was then applied.   All tape and needle counts were correct at the end of the procedure.  The  patient was extubated in the operating room and went to the recovery room in  stable condition.   COMPLICATIONS:  None.   SPECIMENS:  None.   BLOOD LOSS:  Less than 100 cc      ___________________________________________                                            Dalia Heading, M.D.   MAJ/MEDQ  D:  11/12/2003  T:  11/13/2003  Job:  [id number]   cc:   Lazaro Arms, M.D.  [address], [address]  [place]  [place] [address]  Fax: [phone]

## 2011-08-24 LAB — CBC
HCT: 42.5
Hemoglobin: 14.2
MCHC: 33.4
MCV: 76.2 — ABNORMAL LOW
Platelets: 256
RBC: 5.58 — ABNORMAL HIGH
RDW: 15.8 — ABNORMAL HIGH
WBC: 8

## 2011-08-24 LAB — POCT CARDIAC MARKERS: CKMB, poc: 3.3

## 2011-08-24 LAB — URINALYSIS, ROUTINE W REFLEX MICROSCOPIC
Bilirubin Urine: NEGATIVE
Glucose, UA: NEGATIVE
Hgb urine dipstick: NEGATIVE
Ketones, ur: NEGATIVE
Nitrite: NEGATIVE
Protein, ur: NEGATIVE
Specific Gravity, Urine: 1.01
Urobilinogen, UA: 0.2
pH: 5.5

## 2011-08-24 LAB — WOUND CULTURE
Culture: NO GROWTH
Gram Stain: NONE SEEN

## 2011-08-24 LAB — DIFFERENTIAL
Basophils Absolute: 0.1
Basophils Relative: 2 — ABNORMAL HIGH
Eosinophils Absolute: 0.3
Eosinophils Relative: 4
Lymphocytes Relative: 33
Lymphs Abs: 2.6
Monocytes Absolute: 0.4
Monocytes Relative: 6
Neutro Abs: 4.4
Neutrophils Relative %: 55

## 2011-08-24 LAB — COMPREHENSIVE METABOLIC PANEL
ALT: 15
AST: 17
Albumin: 3.8
Alkaline Phosphatase: 64
BUN: 15
CO2: 27
Calcium: 9.6
Chloride: 109
Creatinine, Ser: 0.71
GFR calc Af Amer: >60
GFR calc non Af Amer: >60
Glucose, Bld: 99
Potassium: 4.3
Sodium: 141
Total Bilirubin: 0.4
Total Protein: 6.9

## 2011-08-24 LAB — LIPASE, BLOOD: Lipase: 22

## 2011-08-24 LAB — GLUCOSE, CAPILLARY

## 2011-11-11 ENCOUNTER — Emergency Department (HOSPITAL_COMMUNITY)
Admission: EM | Admit: 2011-11-11 | Discharge: 2011-11-11 | Disposition: A | Discharge status: Home / Self Care | Payer: Medicaid Other | Attending: Internal Medicine | Admitting: Internal Medicine

## 2011-11-11 ENCOUNTER — Encounter (HOSPITAL_COMMUNITY)
Admission: EM | Disposition: A | Discharge status: Home / Self Care | Payer: Self-pay | Source: Home / Self Care | Attending: Emergency Medicine

## 2011-11-11 ENCOUNTER — Emergency Department (HOSPITAL_COMMUNITY): Payer: Medicaid Other

## 2011-11-11 ENCOUNTER — Encounter: Payer: Self-pay | Admitting: *Deleted

## 2011-11-11 DIAGNOSIS — J4489 Other specified chronic obstructive pulmonary disease: Secondary | ICD-10-CM | POA: Insufficient documentation

## 2011-11-11 DIAGNOSIS — R1310 Dysphagia, unspecified: Secondary | ICD-10-CM | POA: Insufficient documentation

## 2011-11-11 DIAGNOSIS — E785 Hyperlipidemia, unspecified: Secondary | ICD-10-CM | POA: Insufficient documentation

## 2011-11-11 DIAGNOSIS — IMO0002 Reserved for concepts with insufficient information to code with codable children: Secondary | ICD-10-CM | POA: Insufficient documentation

## 2011-11-11 DIAGNOSIS — J449 Chronic obstructive pulmonary disease, unspecified: Secondary | ICD-10-CM | POA: Insufficient documentation

## 2011-11-11 DIAGNOSIS — Y929 Unspecified place or not applicable: Secondary | ICD-10-CM | POA: Insufficient documentation

## 2011-11-11 DIAGNOSIS — R221 Localized swelling, mass and lump, neck: Secondary | ICD-10-CM

## 2011-11-11 DIAGNOSIS — T18108A Unspecified foreign body in esophagus causing other injury, initial encounter: Secondary | ICD-10-CM | POA: Insufficient documentation

## 2011-11-11 DIAGNOSIS — R1110 Vomiting, unspecified: Secondary | ICD-10-CM | POA: Insufficient documentation

## 2011-11-11 DIAGNOSIS — I10 Essential (primary) hypertension: Secondary | ICD-10-CM | POA: Insufficient documentation

## 2011-11-11 DIAGNOSIS — Z79899 Other long term (current) drug therapy: Secondary | ICD-10-CM | POA: Insufficient documentation

## 2011-11-11 HISTORY — PX: ESOPHAGOGASTRODUODENOSCOPY: SHX5428

## 2011-11-11 HISTORY — DX: Chronic obstructive pulmonary disease, unspecified: J44.9

## 2011-11-11 HISTORY — PX: FOREIGN BODY REMOVAL: SHX962

## 2011-11-11 HISTORY — DX: Hyperlipidemia, unspecified: E78.5

## 2011-11-11 LAB — CBC
HCT: 32 % — ABNORMAL LOW (ref 36.0–46.0)
Hemoglobin: 10.1 g/dL — ABNORMAL LOW (ref 12.0–15.0)
MCH: 25.8 pg — ABNORMAL LOW (ref 26.0–34.0)
MCHC: 31.6 g/dL (ref 30.0–36.0)
RDW: 14.7 % (ref 11.5–15.5)

## 2011-11-11 LAB — BASIC METABOLIC PANEL
BUN: 22 mg/dL (ref 6–23)
Creatinine, Ser: 1.03 mg/dL (ref 0.50–1.10)
GFR calc non Af Amer: 60 mL/min — ABNORMAL LOW (ref >90)
Glucose, Bld: 100 mg/dL — ABNORMAL HIGH (ref 70–99)
Potassium: 4.5 mEq/L (ref 3.5–5.1)

## 2011-11-11 SURGERY — EGD (ESOPHAGOGASTRODUODENOSCOPY)
Anesthesia: Moderate Sedation

## 2011-11-11 MED ORDER — SODIUM CHLORIDE 0.45 % IV SOLN
Freq: Once | INTRAVENOUS | Status: AC
  Administered 2011-11-11: 1000 mL via INTRAVENOUS

## 2011-11-11 MED ORDER — LORAZEPAM 2 MG/ML IJ SOLN
1.0000 mg | Freq: Once | INTRAMUSCULAR | Status: AC
  Administered 2011-11-11: 1 mg via INTRAVENOUS
  Filled 2011-11-11: qty 1

## 2011-11-11 MED ORDER — MEPERIDINE HCL 25 MG/ML IJ SOLN
INTRAMUSCULAR | Status: DC | PRN
  Administered 2011-11-11 (×4): 25 mg via INTRAVENOUS

## 2011-11-11 MED ORDER — PANTOPRAZOLE SODIUM 40 MG PO TBEC: 40.0000 mg | DELAYED_RELEASE_TABLET | Freq: Every day | ORAL | Status: DC

## 2011-11-11 MED ORDER — BUTAMBEN-TETRACAINE-BENZOCAINE 2-2-14 % EX AERO
INHALATION_SPRAY | CUTANEOUS | Status: DC | PRN
  Administered 2011-11-11 (×2): 1 via TOPICAL

## 2011-11-11 MED ORDER — MIDAZOLAM HCL 5 MG/5ML IJ SOLN
INTRAMUSCULAR | Status: DC | PRN
  Administered 2011-11-11 (×2): 2 mg via INTRAVENOUS
  Administered 2011-11-11: 3 mg via INTRAVENOUS
  Administered 2011-11-11 (×2): 2 mg via INTRAVENOUS
  Administered 2011-11-11: 3 mg via INTRAVENOUS

## 2011-11-11 MED ORDER — GLUCAGON HCL (RDNA) 1 MG IJ SOLR
1.0000 mg | Freq: Once | INTRAMUSCULAR | Status: AC
  Administered 2011-11-11: 1 mg via INTRAVENOUS
  Filled 2011-11-11: qty 1

## 2011-11-11 NOTE — ED Notes (Signed)
Pt states she was eating pork chops last night when she began having the sensation that something was stuck in her thorat. States she is unable to eat or drink today without vomiting the food back up Pt states, "I keep vomiting hot water"

## 2011-11-11 NOTE — H&P (Signed)
Joanna Perez is an 55 y.o. female.   Chief Complaint: Patient is here for EGD and foreign body removal. HPI: Patient is 55 year old Caucasian female who was falling meet last rate and stuck in her esophagus. She's been able to swallow saliva but unable swallowing liquids or solids. She was evaluated in emergency room and sent over for therapeutic EGD. She is trying to continue dentures. She denies chronic heartburn or dysphagia .  Past Medical History  Diagnosis Date  . Asthma   . COPD (chronic obstructive pulmonary disease)   . Bronchitis   . Thyroid disease   . Hypertension   . Hyperlipidemia   . Diabetes mellitus     Past Surgical History  Procedure Date  . Hernia repair   . Abdominal hysterectomy   . Stomach surgery     wound vac currently in place    History reviewed. No pertinent family history. Social History:  reports that she has been smoking Cigarettes.  She does not have any smokeless tobacco history on file. She reports that she drinks alcohol. She reports that she does not use illicit drugs.  Allergies:  Allergies  Allergen Reactions  . Penicillins Itching and Swelling    Sweating    Medications Prior to Admission  Medication Dose Route Frequency Provider Last Rate Last Dose  . 0.45 % sodium chloride infusion   Intravenous Once Malissa Hippo, MD 20 mL/hr at 11/11/11 1524 1,000 mL at 11/11/11 1524  . glucagon (GLUCAGEN) injection 1 mg  1 mg Intravenous Once Blanca Friend, MD   1 mg at 11/11/11 1321  . LORazepam (ATIVAN) injection 1 mg  1 mg Intravenous Once Blanca Friend, MD   1 mg at 11/11/11 1430   No current outpatient prescriptions on file as of 11/11/2011.    Results for orders placed during the hospital encounter of 11/11/11 (from the past 48 hour(s))  GLUCOSE, CAPILLARY     Status: Normal   Collection Time   11/11/11  1:01 PM      Component Value Range Comment   Glucose-Capillary 85  70 - 99 (mg/dL)   BASIC METABOLIC PANEL     Status:  Abnormal   Collection Time   11/11/11  1:10 PM      Component Value Range Comment   Sodium 133 (*) 135 - 145 (mEq/L)    Potassium 4.5  3.5 - 5.1 (mEq/L)    Chloride 99  96 - 112 (mEq/L)    CO2 26  19 - 32 (mEq/L)    Glucose, Bld 100 (*) 70 - 99 (mg/dL)    BUN 22  6 - 23 (mg/dL)    Creatinine, Ser 4.09  0.50 - 1.10 (mg/dL)    Calcium 81.1 (*) 8.4 - 10.5 (mg/dL)    GFR calc non Af Amer 60 (*) >90 (mL/min)    GFR calc Af Amer 70 (*) >90 (mL/min)   CBC     Status: Abnormal   Collection Time   11/11/11  1:10 PM      Component Value Range Comment   WBC 11.0 (*) 4.0 - 10.5 (K/uL)    RBC 3.91  3.87 - 5.11 (MIL/uL)    Hemoglobin 10.1 (*) 12.0 - 15.0 (g/dL)    HCT 91.4 (*) 78.2 - 46.0 (%)    MCV 81.8  78.0 - 100.0 (fL)    MCH 25.8 (*) 26.0 - 34.0 (pg)    MCHC 31.6  30.0 - 36.0 (g/dL)    RDW 14.7  11.5 - 15.5 (%)    Platelets 496 (*) 150 - 400 (K/uL)    Dg Chest 2 View  11/11/2011  *RADIOLOGY REPORT*  Clinical Data: Dysphagia.  CHEST - 2 VIEW  Comparison:  12/09/2010  Findings: Cardiomediastinal silhouette is stable.  Mild perihilar increased bronchial markings.  There is subtle reticular nodular interstitial prominence bilaterally in  lower lobes. Atypical infection or pneumonitis cannot be excluded. Clinical correlation is necessary.  No segmental consolidation or pulmonary edema.  IMPRESSION: Mild perihilar increased bronchial markings.  There is subtle reticular nodular interstitial prominence bilaterally in  lower lobes. Atypical infection or pneumonitis cannot be excluded. Clinical correlation is necessary.  No segmental consolidation or pulmonary edema.  Original Report Authenticated By: Natasha Mead, M.D.    Review of Systems  Constitutional: Weight loss: 20 pound weight loss since August 2012.  Gastrointestinal: Negative for heartburn, nausea, vomiting, diarrhea, constipation, blood in stool and melena.    Blood pressure 147/76, pulse 98, temperature 98 F (36.7 C), temperature  source Oral, resp. rate 18, height 5\' 2"  (1.575 m), weight 175 lb 12.8 oz (79.742 kg), SpO2 98.00%. Physical Exam  Constitutional: She appears well-developed and well-nourished.  HENT:       Patient is edentulous  Eyes: Conjunctivae are normal. No scleral icterus.  Neck: No thyromegaly present.       Tracheostomy scar  Cardiovascular: Normal rate, regular rhythm and normal heart sounds.   No murmur heard. Respiratory: Effort normal and breath sounds normal.  GI:       Patient has abdominal wound covered with wound VAC. Therefore examination was limited; rest of the abdomen is normal  Musculoskeletal: She exhibits no edema.  Lymphadenopathy:    She has no cervical adenopathy.  Neurological: She is alert.  Skin: Skin is warm and dry.     Assessment/Plan Foreign body esophagus. EGD with foreign body removal and possible esophageal dilation  Arjun Hard U 11/11/2011, 4:55 PM

## 2011-11-11 NOTE — ED Notes (Signed)
No Distress noted at this time.

## 2011-11-11 NOTE — ED Provider Notes (Signed)
History     CSN: [id number] Arrival date & time: 11/11/2011 11:49 AM   First MD Initiated Contact with Patient 11/11/11 1206      Chief Complaint  Patient presents with  . Foreign Body    throat    (Consider location/radiation/quality/duration/timing/severity/associated sxs/prior treatment) HPI  55 y/o with history of COPD, GERD, infected hernia repair mesh who presents with feeling of lump in throat and dysphagia since eating some pork chops yesterday.  Tried drinking cold water, hot coffee, tea, but last night coughed up or vomited all of these as well as her evening meds. This morning coughed up sometimes and kept some stuff down, including morning meds.  Vomited up water she tried to drink on arrival to ED.  She was discharged from [facility] on Nov 28th.  Was there for a long time due to abd infection of past hernia repair mesh then subsequent wound dehiscence.  Had large debridement surgery and wound vac is still in place.  Required a trach during that admission.  Janina Mayo was taken out on Nov 3rd or 4th.       Past Medical History  Diagnosis Date  . Asthma   . COPD (chronic obstructive pulmonary disease)   . Bronchitis   . Thyroid disease   . Hypertension   . Hyperlipidemia     Past Surgical History  Procedure Date  . Hernia repair   . Abdominal hysterectomy   . Stomach surgery     wound vac currently in place    No family history on file.  History  Substance Use Topics  . Smoking status: Current Everyday Smoker    Types: Cigarettes  . Smokeless tobacco: Not on file  . Alcohol Use: Yes     1-2 beers nightly     Review of Systems No other changes from baseline on comprehensive ROS that was performed.   Allergies  Penicillins  Home Medications  No current outpatient prescriptions on file.  BP 139/76  Pulse 108  Temp(Src) 98.6 F (37 C) (Oral)  Resp 16  Ht 5\' 2"  (1.575 m)  Wt 175 lb 12.8 oz (79.742 kg)  BMI 32.15 kg/m2  SpO2  100%  Physical Exam  General: alert, well-developed, and cooperative to examination. Mild distress Head: normocephalic and atraumatic.  Eyes: vision grossly intact, pupils equal, pupils round, pupils reactive to light, no injection and anicteric.  Mouth: pharynx pink and moist, no erythema, and no exudates.  Neck:  no thyromegaly, tracheostomy scar, no JVD, no upper airway turbulence Lungs: normal respiratory effort, no accessory muscle use, normal breath sounds, no crackles, and no wheezes. Heart: normal rate, regular rhythm, no murmur, no gallop, and no rub.   Abdomen: Wound vac in place over large central/lower abdominal wound.   Neurologic: alert & oriented X3, cranial nerves II-XII intact  ED Course  Procedures (including critical care time)  Labs Reviewed - No data to display Dg Chest 2 View  11/11/2011  *RADIOLOGY REPORT*  Clinical Data: Dysphagia.  CHEST - 2 VIEW  Comparison:  12/09/2010  Findings: Cardiomediastinal silhouette is stable.  Mild perihilar increased bronchial markings.  There is subtle reticular nodular interstitial prominence bilaterally in  lower lobes. Atypical infection or pneumonitis cannot be excluded. Clinical correlation is necessary.  No segmental consolidation or pulmonary edema.  IMPRESSION: Mild perihilar increased bronchial markings.  There is subtle reticular nodular interstitial prominence bilaterally in  lower lobes. Atypical infection or pneumonitis cannot be excluded. Clinical correlation is necessary.  No  segmental consolidation or pulmonary edema.  Original Report Authenticated By: Natasha Mead, M.D.   Patient drinks glass of water.  Kept it all down, but with a lot of discomfort and feeling like it would come back up.  Glucagon IV given  Patient drinks another glass of water.  Mild improvement in pain but patient is very tearful over throat pain and anxiety of what will happen.  Ativan given  GI consultant Dr. Karilyn Cota called  Patient to be  discharged to endoscopy suite under care of Dr. Karilyn Cota in Short stay   1. Dysphagia   2. Sensation of lump in throat       MDM  I discussed all aspects of this case with my attending Dr. Adriana Simas.  He agreed with assessment and plan.  Patient has foreign body sensation and difficult swallowing since dinner last night.  Possible food matter stuck in throat.  Being sent to short stay under care of Dr. Karilyn Cota for possible upper Gi endoscopy.         Blanca Friend, MD 11/11/11 [phone]

## 2011-11-11 NOTE — Progress Notes (Signed)
Message left at Dr Patty Sermons office to schedule xray.

## 2011-11-11 NOTE — Op Note (Addendum)
EGD PROCEDURE REPORT  PATIENT:  Joanna Perez  MR#:  [id number] Birthdate:  [birth year], 55 y.o., female Endoscopist:  Dr. Malissa Hippo, MD Referred By:  Dr. Madelin Rear. Sherwood Gambler, MD Procedure Date: 11/11/2011  Procedure:   EGD  Indications:  Patient is 55 year old Caucasian female who was esophageal foreign body. He is undergoing therapeutic EGD.            Informed Consent:  The risks, benefits, alternatives & imponderables which include, but are not limited to, bleeding, infection, perforation, drug reaction and potential missed lesion have been reviewed.  The potential for biopsy, lesion removal, esophageal dilation, etc. have also been discussed.  Questions have been answered.  All parties agreeable.  Please see history & physical in medical record for more information.  Medications:  Demerol 100 mg IV Versed 14 mg IV Cetacaine spray topically for oropharyngeal anesthesia  Description of procedure:  The endoscope was introduced through the mouth and advanced to the second portion of the duodenum without difficulty or limitations. The mucosal surfaces were surveyed very carefully during advancement of the scope and upon withdrawal.  Findings:  Esophagus:  Foreign body was identified just below UES. He was caught with 4 prong biopsy forceps. As foreign body was pulled it broke off and 3 pieces were removed. Patient then was able to a regurgitate the foreign body. There was a superficial circumferential ulcer just below UES felt to be due to impacted foreign body. Mucosa  rest of the esophagus normal. GEJ:  37 cm Stomach:  Stomach was empty and distended very well with insufflation. Folds in the proximal stomach were normal. Examination mucosa at body, antrum, pyloric channel, angularis, fundus and cardia was normal Duodenum:  Normal bulbar and post bulbar mucosa.  Therapeutic/Diagnostic Maneuvers Performed:  See above.  Complications:  None  Impression: Esophageal foreign body  just below UES. 3 fragments were removed with 4 prong biopsy forceps and rest of it was regurgitated by the patient. Superficial circumferential ulcer at the site of impacted foreign body felt to be physical injury. No obvious stricture identified.  Recommendations:  Soft foods for 2 days. Patient  will return after the holidays for barium pill esophagogram. Pantoprazole 40 mg by mouth every morning  Clinton Wahlberg U  11/11/2011  5:34 PM  CC: Dr. Cassell Smiles., MD, MD & Dr. Bonnetta Barry ref. provider found

## 2011-11-11 NOTE — ED Notes (Signed)
Pt states she was eating pork chops last pm and was not able to chew them d/t missing teeth. Pt states she tore pork up and swallowed. Pt states she now feels like she has something caught in her throat.

## 2011-11-12 ENCOUNTER — Other Ambulatory Visit (INDEPENDENT_AMBULATORY_CARE_PROVIDER_SITE_OTHER): Payer: Self-pay | Admitting: Internal Medicine

## 2011-11-18 ENCOUNTER — Ambulatory Visit (HOSPITAL_COMMUNITY)
Admission: RE | Admit: 2011-11-18 | Discharge: 2011-11-18 | Disposition: A | Discharge status: Home / Self Care | Payer: Medicaid Other | Source: Ambulatory Visit | Attending: Internal Medicine | Admitting: Internal Medicine

## 2011-11-18 DIAGNOSIS — R1310 Dysphagia, unspecified: Secondary | ICD-10-CM | POA: Insufficient documentation

## 2011-11-26 ENCOUNTER — Encounter (HOSPITAL_COMMUNITY): Payer: Self-pay | Admitting: Internal Medicine

## 2011-11-27 ENCOUNTER — Other Ambulatory Visit (HOSPITAL_COMMUNITY): Payer: Medicaid Other

## 2012-02-24 ENCOUNTER — Other Ambulatory Visit (HOSPITAL_COMMUNITY): Payer: Self-pay | Admitting: Internal Medicine

## 2012-02-24 DIAGNOSIS — Z139 Encounter for screening, unspecified: Secondary | ICD-10-CM

## 2012-02-29 ENCOUNTER — Ambulatory Visit (HOSPITAL_COMMUNITY)
Admission: RE | Admit: 2012-02-29 | Discharge: 2012-02-29 | Disposition: A | Discharge status: Home / Self Care | Payer: Medicaid Other | Source: Ambulatory Visit | Attending: Internal Medicine | Admitting: Internal Medicine

## 2012-02-29 DIAGNOSIS — Z1231 Encounter for screening mammogram for malignant neoplasm of breast: Secondary | ICD-10-CM | POA: Insufficient documentation

## 2012-02-29 DIAGNOSIS — Z139 Encounter for screening, unspecified: Secondary | ICD-10-CM

## 2012-05-02 ENCOUNTER — Ambulatory Visit: Payer: Medicaid Other | Attending: Neurology | Admitting: Sleep Medicine

## 2012-05-02 DIAGNOSIS — R0989 Other specified symptoms and signs involving the circulatory and respiratory systems: Secondary | ICD-10-CM | POA: Insufficient documentation

## 2012-05-02 DIAGNOSIS — R5381 Other malaise: Secondary | ICD-10-CM | POA: Insufficient documentation

## 2012-05-02 DIAGNOSIS — R5383 Other fatigue: Secondary | ICD-10-CM | POA: Insufficient documentation

## 2012-05-02 DIAGNOSIS — R059 Cough, unspecified: Secondary | ICD-10-CM | POA: Insufficient documentation

## 2012-05-02 DIAGNOSIS — R05 Cough: Secondary | ICD-10-CM | POA: Insufficient documentation

## 2012-05-02 DIAGNOSIS — R0609 Other forms of dyspnea: Secondary | ICD-10-CM | POA: Insufficient documentation

## 2012-05-02 DIAGNOSIS — G4730 Sleep apnea, unspecified: Secondary | ICD-10-CM

## 2012-05-07 NOTE — Procedures (Signed)
[name], Joanna Perez              ACCOUNT NO.:  [id number]  MEDICAL RECORD NO.:  [id number]          PATIENT TYPE:  OUT  LOCATION:  SLEEP LAB                     FACILITY:  [facility]  PHYSICIAN:  Adain Geurin A. Gerilyn Pilgrim, M.D. DATE OF BIRTH:  16-Oct-1956  DATE OF STUDY:  05/02/2012                           NOCTURNAL POLYSOMNOGRAM  REFERRING PHYSICIAN:  Stalin Gruenberg A. Gerilyn Pilgrim, M.D.  INDICATION:  A 56 year old who presents with marked hypersomnia, fatigue, loud snoring, and awakening with coughing at nighttime.   MEDICATIONS:  Altace, levothyroxine, Flonase, Protonix, Phenergan, Ambien, Allegra, albuterol, Maxzide, magnesium oxide, hydrocodone, Zocor, Xanax, theophylline, Lasix, Cipro, and cyclobenzaprine.  EPWORTH SLEEPINESS SCALE:  13.  BMI 31.  ARCHITECTURAL SUMMARY:  The total recording time is 422 minutes.  Sleep efficiency 84%.  Sleep latency 28 minutes.  REM latency 133 minutes. Stage N1 12%, N2 52%, N3 70%, and REM sleep 18%.  RESPIRATORY SUMMARY:  Baseline oxygen saturation was noted to be 89, however, before she fell asleep she had lower saturations in the low 80s and in fact had a low saturation of 79 20 minutes after the lights were out, but before she fell asleep, lowest saturation during REM sleep was 86 and non-REM sleep 87 minutes.  Diagnostic AHI is 1, RDI also 1.  The patient was started on 2 L of oxygen during the study, but still had low O2 saturations in the low 90s and high 80s.  The lowest saturation on oxygen is 88.  LIMB MOVEMENT SUMMARY:  PLM index 0.  ELECTROCARDIOGRAM SUMMARY:  Average heart rate is 88 with intermittent PVCs observed.  IMPRESSION:  Hypoventilation syndrome.  RECOMMENDATION:  3 L nasal oxygen.    Delecia Vastine A. Gerilyn Pilgrim, M.D.    KAD/MEDQ  D:  05/07/2012 22:01:17  T:  05/07/2012 22:55:57  Job:  [id number]

## 2012-12-18 ENCOUNTER — Emergency Department (HOSPITAL_COMMUNITY)
Admission: EM | Admit: 2012-12-18 | Discharge: 2012-12-18 | Disposition: A | Discharge status: Home / Self Care | Payer: Medicaid Other | Attending: Emergency Medicine | Admitting: Emergency Medicine

## 2012-12-18 ENCOUNTER — Emergency Department (HOSPITAL_COMMUNITY): Payer: Medicaid Other

## 2012-12-18 ENCOUNTER — Encounter (HOSPITAL_COMMUNITY): Payer: Self-pay | Admitting: Emergency Medicine

## 2012-12-18 DIAGNOSIS — W010XXA Fall on same level from slipping, tripping and stumbling without subsequent striking against object, initial encounter: Secondary | ICD-10-CM | POA: Insufficient documentation

## 2012-12-18 DIAGNOSIS — W01119A Fall on same level from slipping, tripping and stumbling with subsequent striking against unspecified sharp object, initial encounter: Secondary | ICD-10-CM | POA: Insufficient documentation

## 2012-12-18 DIAGNOSIS — Z79899 Other long term (current) drug therapy: Secondary | ICD-10-CM | POA: Insufficient documentation

## 2012-12-18 DIAGNOSIS — E079 Disorder of thyroid, unspecified: Secondary | ICD-10-CM | POA: Insufficient documentation

## 2012-12-18 DIAGNOSIS — S0003XA Contusion of scalp, initial encounter: Secondary | ICD-10-CM | POA: Insufficient documentation

## 2012-12-18 DIAGNOSIS — Y939 Activity, unspecified: Secondary | ICD-10-CM | POA: Insufficient documentation

## 2012-12-18 DIAGNOSIS — Y9289 Other specified places as the place of occurrence of the external cause: Secondary | ICD-10-CM | POA: Insufficient documentation

## 2012-12-18 DIAGNOSIS — S0093XA Contusion of unspecified part of head, initial encounter: Secondary | ICD-10-CM

## 2012-12-18 DIAGNOSIS — W1809XA Striking against other object with subsequent fall, initial encounter: Secondary | ICD-10-CM | POA: Insufficient documentation

## 2012-12-18 DIAGNOSIS — R112 Nausea with vomiting, unspecified: Secondary | ICD-10-CM | POA: Insufficient documentation

## 2012-12-18 DIAGNOSIS — Z8709 Personal history of other diseases of the respiratory system: Secondary | ICD-10-CM | POA: Insufficient documentation

## 2012-12-18 DIAGNOSIS — E785 Hyperlipidemia, unspecified: Secondary | ICD-10-CM | POA: Insufficient documentation

## 2012-12-18 DIAGNOSIS — J45909 Unspecified asthma, uncomplicated: Secondary | ICD-10-CM | POA: Insufficient documentation

## 2012-12-18 DIAGNOSIS — E119 Type 2 diabetes mellitus without complications: Secondary | ICD-10-CM | POA: Insufficient documentation

## 2012-12-18 DIAGNOSIS — J45901 Unspecified asthma with (acute) exacerbation: Secondary | ICD-10-CM | POA: Insufficient documentation

## 2012-12-18 DIAGNOSIS — J4489 Other specified chronic obstructive pulmonary disease: Secondary | ICD-10-CM | POA: Insufficient documentation

## 2012-12-18 DIAGNOSIS — S5000XA Contusion of unspecified elbow, initial encounter: Secondary | ICD-10-CM

## 2012-12-18 DIAGNOSIS — F411 Generalized anxiety disorder: Secondary | ICD-10-CM | POA: Insufficient documentation

## 2012-12-18 DIAGNOSIS — S4980XA Other specified injuries of shoulder and upper arm, unspecified arm, initial encounter: Secondary | ICD-10-CM | POA: Insufficient documentation

## 2012-12-18 DIAGNOSIS — S0101XA Laceration without foreign body of scalp, initial encounter: Secondary | ICD-10-CM

## 2012-12-18 DIAGNOSIS — S1093XA Contusion of unspecified part of neck, initial encounter: Secondary | ICD-10-CM | POA: Insufficient documentation

## 2012-12-18 DIAGNOSIS — S46909A Unspecified injury of unspecified muscle, fascia and tendon at shoulder and upper arm level, unspecified arm, initial encounter: Secondary | ICD-10-CM | POA: Insufficient documentation

## 2012-12-18 DIAGNOSIS — J449 Chronic obstructive pulmonary disease, unspecified: Secondary | ICD-10-CM | POA: Insufficient documentation

## 2012-12-18 DIAGNOSIS — S0100XA Unspecified open wound of scalp, initial encounter: Secondary | ICD-10-CM | POA: Insufficient documentation

## 2012-12-18 DIAGNOSIS — F17200 Nicotine dependence, unspecified, uncomplicated: Secondary | ICD-10-CM | POA: Insufficient documentation

## 2012-12-18 DIAGNOSIS — IMO0002 Reserved for concepts with insufficient information to code with codable children: Secondary | ICD-10-CM | POA: Insufficient documentation

## 2012-12-18 NOTE — ED Notes (Signed)
Patient c/o elbow pain, headache, and laceration to top of head after falling last night on porch. Patient denies LOC, dizziness or blurred vision. Patient denies taking any kind of blood thinners

## 2012-12-18 NOTE — ED Provider Notes (Signed)
History     CSN: [id number]  Arrival date & time 12/18/12  1106   None     Chief Complaint  Patient presents with  . Fall  . Headache  . Elbow Pain  . Head Laceration    (Consider location/radiation/quality/duration/timing/severity/associated sxs/prior treatment) HPIRamona F Perez is a 57 y.o. female who presents to the ED with headache. The headache started last night. The onset was acute. Patient tripped and hit the porch rail. She has a laceration to the back of her head and right elbow pain. She also has pain between the shoulder blades. She denies LOC. Took hydrocodone 10 mg for pain. Has continued to have a bad headache. Denies visual problems but did have nausea and vomiting this morning.   Past Medical History  Diagnosis Date  . Asthma   . COPD (chronic obstructive pulmonary disease)   . Bronchitis   . Thyroid disease   . Hypertension   . Hyperlipidemia   . Diabetes mellitus     Past Surgical History  Procedure Date  . Hernia repair   . Abdominal hysterectomy   . Stomach surgery     wound vac currently in place  . Esophagogastroduodenoscopy 11/11/2011    Procedure: ESOPHAGOGASTRODUODENOSCOPY (EGD);  Surgeon: Joanna Hippo, MD;  Location: [facility] ENDO SUITE;  Service: Endoscopy;  Laterality: N/A;  . Foreign body removal 11/11/2011    Procedure: FOREIGN BODY REMOVAL;  Surgeon: Joanna Hippo, MD;  Location: [facility] ENDO [facility];  Service: Endoscopy;  Laterality: N/A;  . Tracheal surgery     Family History  Problem Relation Age of Onset  . Diabetes Mother     History  Substance Use Topics  . Smoking status: Current Every Day Smoker -- 1.5 packs/day for 35 years    Types: Cigarettes  . Smokeless tobacco: Never Used  . Alcohol Use: Yes     Comment: 1-2 beers nightly    OB History    Grav Para Term Preterm Abortions TAB SAB Ect Mult Living   2 2 2       1       Review of Systems  Constitutional: Negative for fever, chills, diaphoresis and fatigue.  HENT:  Negative for ear pain, nosebleeds, congestion, sore throat, facial swelling, neck pain, neck stiffness, dental problem and sinus pressure.   Eyes: Negative for photophobia, pain, discharge and visual disturbance.  Respiratory: Positive for wheezing (asthma, COPD). Negative for cough and chest tightness.   Cardiovascular: Negative for chest pain and palpitations.  Gastrointestinal: Positive for nausea and vomiting. Negative for abdominal pain, diarrhea, constipation and abdominal distention.  Genitourinary: Negative for dysuria, frequency, flank pain and difficulty urinating.  Musculoskeletal: Positive for back pain. Negative for myalgias and gait problem.  Skin: Negative for color change and rash.  Neurological: Positive for headaches. Negative for dizziness, speech difficulty, weakness, light-headedness and numbness.  Psychiatric/Behavioral: Negative for confusion and agitation. The patient is nervous/anxious (hx of panci attacks).     Allergies  Penicillins  Home Medications   Current Outpatient Rx  Name  Route  Sig  Dispense  Refill  . ALBUTEROL SULFATE HFA 108 (90 BASE) MCG/ACT IN AERS   Inhalation   Inhale 2 puffs into the lungs every 4 (four) hours as needed. Shortness of breath          . ALBUTEROL SULFATE 2 MG PO TABS   Oral   Take 2 mg by mouth 3 (three) times daily.           Marland Kitchen  ALPRAZOLAM 1 MG PO TABS   Oral   Take 1 mg by mouth 3 (three) times daily.           . BUDESONIDE-FORMOTEROL FUMARATE 80-4.5 MCG/ACT IN AERO   Inhalation   Inhale 2 puffs into the lungs 2 (two) times daily.           Marland Kitchen CALCIUM CARBONATE ANTACID 750 MG PO CHEW   Oral   Chew 2 tablets by mouth 2 (two) times daily.           Marland Kitchen CITALOPRAM HYDROBROMIDE 10 MG PO TABS   Oral   Take 10 mg by mouth daily.           . FUROSEMIDE 40 MG PO TABS   Oral   Take 40 mg by mouth daily.           Marland Kitchen HYDROCODONE-ACETAMINOPHEN 10-500 MG PO TABS   Oral   Take 2 tablets by mouth every 4 (four)  hours as needed. Pain          . IBUPROFEN 200 MG PO TABS   Oral   Take 400 mg by mouth every 6 (six) hours as needed. Pain          . LEVOTHYROXINE SODIUM 112 MCG PO TABS   Oral   Take 112 mcg by mouth daily.           Marland Kitchen MAGNESIUM OXIDE 400 MG PO TABS   Oral   Take 400 mg by mouth 2 (two) times daily.           Marland Kitchen PANTOPRAZOLE SODIUM 40 MG PO TBEC   Oral   Take 1 tablet (40 mg total) by mouth daily.   30 tablet   5   . RAMIPRIL 5 MG PO CAPS   Oral   Take 5 mg by mouth daily.           Marland Kitchen SIMVASTATIN 40 MG PO TABS   Oral   Take 40 mg by mouth at bedtime.           . THEOPHYLLINE 300 MG PO TB12   Oral   Take 600 mg by mouth 2 (two) times daily.            BP 142/66  Pulse 83  Temp 98.1 F (36.7 C) (Oral)  Resp 16  Ht 5\' 4"  (1.626 m)  Wt 186 lb (84.369 kg)  BMI 31.93 kg/m2  SpO2 93%  Physical Exam  Nursing note and vitals reviewed. Constitutional: She is oriented to person, place, and time. She appears well-developed and well-nourished. No distress.  HENT:  Head: Head is with laceration.    Right Ear: External ear normal.  Left Ear: External ear normal.  Nose: No epistaxis.  Mouth/Throat: Uvula is midline, oropharynx is clear and moist and mucous membranes are normal.  Eyes: EOM are normal. Pupils are equal, round, and reactive to light.  Neck: Neck supple.  Cardiovascular: Normal rate and regular rhythm.   Pulmonary/Chest: Effort normal.  Abdominal: Soft. There is no tenderness.  Musculoskeletal:       Right elbow with ecchymosis, abrasion and tenderness. Mildly tender thoracic area with palpation.  Neurological: She is alert and oriented to person, place, and time. She has normal strength and normal reflexes. No cranial nerve deficit or sensory deficit. Coordination normal.  Skin: Skin is warm and dry.  Psychiatric: She has a normal mood and affect. Her behavior is normal. Judgment and thought content normal.  Procedures Scalp laceration  cleaned and staples x 2 to close wound  Dg Elbow Complete Right  12/18/2012  *RADIOLOGY REPORT*  Clinical Data: Fall.  Posterior pain.  RIGHT ELBOW - COMPLETE 3+ VIEW  Comparison: None.  Findings: No elbow effusion is observed.  Faint linear calcification above the medial epicondyle is likely incidental, and given its proximal position is less likely from periostitis related to medial epicondylitis in this golfer.  Minimal spurring of the coronoid process of the ulna noted.  IMPRESSION:  1.  No fracture observed. 2.  Faint linear calcification of the proximal to the medial epicondyle is probably incidental, and less likely to reflect periostitis related to medial epicondylitis.   Original Report Authenticated By: Gaylyn Rong, M.D.    Ct Head Wo Contrast  12/18/2012  *RADIOLOGY REPORT*  Clinical Data: 57 year old female status post fall.  Back of head injury.  Loss of consciousness.  Nausea vomiting.  CT HEAD WITHOUT CONTRAST  Technique:  Contiguous axial images were obtained from the base of the skull through the vertex without contrast.  Comparison: None.  Findings:  No scalp hematoma identified.  Occasional left mastoid air cell opacification.  Other Visualized paranasal sinuses and mastoids are clear.  No acute osseous abnormality identified. Visualized orbit soft tissues are within normal limits.  .  Occasional mild subcortical white matter hypodensity. Cerebral volume is within normal limits for age.  No midline shift, ventriculomegaly, mass effect, evidence of mass lesion, intracranial hemorrhage or evidence of cortically based acute infarction.  Gray-white matter differentiation is within normal limits throughout the brain.  Possible partially empty sella configuration. No suspicious intracranial vascular hyperdensity. Mild Calcified atherosclerosis at the skull base.  IMPRESSION: 1. No acute intracranial abnormality.  No acute traumatic injury identified. 2.  Mild nonspecific white matter  hypodensity.   Original Report Authenticated By: Erskine Speed, M.D.     Assessment: 57 y.o. female with laceration to scalp s/p fall   Headache   Right elbow contusion   Back pain  Plan:  Staple to scalp wound   Patient has pain medication at home   Follow up with PCP or return as needed  I have reviewed this patient's vital signs, nurses notes, appropriate labs and imaging.  I  Have discussed findings with the patient and need for follow up with PCP. Patient voices understanding.   Medication List     As of 12/18/2012  3:24 PM    ASK your doctor about these medications         * albuterol 2 MG tablet   Commonly known as: PROVENTIL      * albuterol 108 (90 BASE) MCG/ACT inhaler   Commonly known as: PROVENTIL HFA;VENTOLIN HFA      ALPRAZolam 1 MG tablet   Commonly known as: XANAX      budesonide-formoterol 80-4.5 MCG/ACT inhaler   Commonly known as: SYMBICORT      citalopram 10 MG tablet   Commonly known as: CELEXA      furosemide 40 MG tablet   Commonly known as: LASIX      HYDROcodone-acetaminophen 10-500 MG per tablet   Commonly known as: LORTAB      ibuprofen 200 MG tablet   Commonly known as: ADVIL,MOTRIN      levothyroxine 112 MCG tablet   Commonly known as: SYNTHROID, LEVOTHROID      magnesium oxide 400 MG tablet   Commonly known as: MAG-OX      pantoprazole 40 MG tablet  Commonly known as: PROTONIX   Take 1 tablet (40 mg total) by mouth daily.      ramipril 5 MG capsule   Commonly known as: ALTACE      simvastatin 40 MG tablet   Commonly known as: ZOCOR      theophylline 300 MG 12 hr tablet   Commonly known as: THEODUR      TUMS CALCIUM FOR LIFE BONE 750 MG chewable tablet   Generic drug: calcium carbonate     * Notice: This list has 2 medication(s) that are the same as other medications prescribed for you. Read the directions carefully, and ask your doctor or other care provider to review them with you.              Janne Napoleon,  [place] 12/18/12 1524

## 2012-12-18 NOTE — ED Provider Notes (Signed)
Medical screening examination/treatment/procedure(s) were performed by non-physician practitioner and as supervising physician I was immediately available for consultation/collaboration.    Kiasha Bellin R Elloise Roark, MD 12/18/12 1603 

## 2013-04-13 ENCOUNTER — Other Ambulatory Visit (HOSPITAL_COMMUNITY): Payer: Self-pay | Admitting: Internal Medicine

## 2013-04-13 DIAGNOSIS — Z139 Encounter for screening, unspecified: Secondary | ICD-10-CM

## 2013-04-18 ENCOUNTER — Ambulatory Visit (HOSPITAL_COMMUNITY)
Admission: RE | Admit: 2013-04-18 | Discharge: 2013-04-18 | Disposition: A | Discharge status: Home / Self Care | Payer: Medicaid Other | Source: Ambulatory Visit | Attending: Internal Medicine | Admitting: Internal Medicine

## 2013-04-18 DIAGNOSIS — Z1231 Encounter for screening mammogram for malignant neoplasm of breast: Secondary | ICD-10-CM | POA: Insufficient documentation

## 2013-04-18 DIAGNOSIS — Z139 Encounter for screening, unspecified: Secondary | ICD-10-CM

## 2013-08-27 ENCOUNTER — Encounter (HOSPITAL_COMMUNITY): Payer: Self-pay | Admitting: *Deleted

## 2013-08-27 ENCOUNTER — Emergency Department (HOSPITAL_COMMUNITY): Payer: Medicaid Other

## 2013-08-27 ENCOUNTER — Emergency Department (HOSPITAL_COMMUNITY)
Admission: EM | Admit: 2013-08-27 | Discharge: 2013-08-27 | Disposition: A | Discharge status: Home / Self Care | Payer: Medicaid Other | Attending: Emergency Medicine | Admitting: Emergency Medicine

## 2013-08-27 DIAGNOSIS — F17200 Nicotine dependence, unspecified, uncomplicated: Secondary | ICD-10-CM | POA: Insufficient documentation

## 2013-08-27 DIAGNOSIS — I10 Essential (primary) hypertension: Secondary | ICD-10-CM | POA: Insufficient documentation

## 2013-08-27 DIAGNOSIS — E785 Hyperlipidemia, unspecified: Secondary | ICD-10-CM | POA: Insufficient documentation

## 2013-08-27 DIAGNOSIS — Z880 Allergy status to penicillin: Secondary | ICD-10-CM | POA: Insufficient documentation

## 2013-08-27 DIAGNOSIS — E079 Disorder of thyroid, unspecified: Secondary | ICD-10-CM | POA: Insufficient documentation

## 2013-08-27 DIAGNOSIS — E119 Type 2 diabetes mellitus without complications: Secondary | ICD-10-CM | POA: Insufficient documentation

## 2013-08-27 DIAGNOSIS — R0600 Dyspnea, unspecified: Secondary | ICD-10-CM

## 2013-08-27 DIAGNOSIS — Z79899 Other long term (current) drug therapy: Secondary | ICD-10-CM | POA: Insufficient documentation

## 2013-08-27 DIAGNOSIS — J441 Chronic obstructive pulmonary disease with (acute) exacerbation: Secondary | ICD-10-CM | POA: Insufficient documentation

## 2013-08-27 DIAGNOSIS — R079 Chest pain, unspecified: Secondary | ICD-10-CM | POA: Insufficient documentation

## 2013-08-27 LAB — TROPONIN I: Troponin I: <0.3 ng/mL (ref <0.30)

## 2013-08-27 LAB — BASIC METABOLIC PANEL
BUN: 15 mg/dL (ref 6–23)
Chloride: 93 mEq/L — ABNORMAL LOW (ref 96–112)
GFR calc Af Amer: >90 mL/min (ref >90)
Potassium: 4.6 mEq/L (ref 3.5–5.1)
Sodium: 131 mEq/L — ABNORMAL LOW (ref 135–145)

## 2013-08-27 LAB — PRO B NATRIURETIC PEPTIDE: Pro B Natriuretic peptide (BNP): 959.3 pg/mL — ABNORMAL HIGH (ref 0–125)

## 2013-08-27 LAB — CBC WITH DIFFERENTIAL/PLATELET
Basophils Relative: 0 % (ref 0–1)
Hemoglobin: 14.7 g/dL (ref 12.0–15.0)
Lymphs Abs: 2.1 10*3/uL (ref 0.7–4.0)
MCHC: 32.3 g/dL (ref 30.0–36.0)
Monocytes Relative: 8 % (ref 3–12)
Neutro Abs: 12.2 10*3/uL — ABNORMAL HIGH (ref 1.7–7.7)
Neutrophils Relative %: 78 % — ABNORMAL HIGH (ref 43–77)
Platelets: 225 10*3/uL (ref 150–400)
RBC: 5.76 MIL/uL — ABNORMAL HIGH (ref 3.87–5.11)

## 2013-08-27 MED ORDER — FUROSEMIDE 20 MG PO TABS: 20.0000 mg | ORAL_TABLET | Freq: Two times a day (BID) | ORAL | Status: DC

## 2013-08-27 MED ORDER — IPRATROPIUM BROMIDE 0.02 % IN SOLN
0.5000 mg | Freq: Once | RESPIRATORY_TRACT | Status: AC
  Administered 2013-08-27: 0.5 mg via RESPIRATORY_TRACT
  Filled 2013-08-27: qty 2.5

## 2013-08-27 MED ORDER — FUROSEMIDE 10 MG/ML IJ SOLN
40.0000 mg | Freq: Once | INTRAMUSCULAR | Status: AC
  Administered 2013-08-27: 40 mg via INTRAVENOUS
  Filled 2013-08-27: qty 4

## 2013-08-27 MED ORDER — IOHEXOL 350 MG/ML SOLN
100.0000 mL | Freq: Once | INTRAVENOUS | Status: AC | PRN
  Administered 2013-08-27: 100 mL via INTRAVENOUS

## 2013-08-27 MED ORDER — HYDROCODONE-ACETAMINOPHEN 5-325 MG PO TABS
2.0000 | ORAL_TABLET | Freq: Once | ORAL | Status: AC
  Administered 2013-08-27: 2 via ORAL
  Filled 2013-08-27: qty 2

## 2013-08-27 MED ORDER — ALBUTEROL SULFATE (5 MG/ML) 0.5% IN NEBU
5.0000 mg | INHALATION_SOLUTION | Freq: Once | RESPIRATORY_TRACT | Status: AC
  Administered 2013-08-27: 5 mg via RESPIRATORY_TRACT
  Filled 2013-08-27: qty 1

## 2013-08-27 MED ORDER — HYDROCODONE-ACETAMINOPHEN 5-325 MG PO TABS: ORAL_TABLET | ORAL | Status: DC

## 2013-08-27 MED ORDER — METHYLPREDNISOLONE SODIUM SUCC 125 MG IJ SOLR
125.0000 mg | Freq: Once | INTRAMUSCULAR | Status: AC
  Administered 2013-08-27: 125 mg via INTRAVENOUS
  Filled 2013-08-27: qty 2

## 2013-08-27 NOTE — ED Notes (Signed)
MD at bedside. 

## 2013-08-27 NOTE — ED Provider Notes (Signed)
CSN: [id number]     Arrival date & time 08/27/13  0920 History   First MD Initiated Contact with Patient 08/27/13 [phone]     Chief Complaint  Patient presents with  . Shortness of Breath   (Consider location/radiation/quality/duration/timing/severity/associated sxs/prior Treatment) Patient is a 57 y.o. female presenting with shortness of breath. The history is provided by the patient.  Shortness of Breath Severity:  Moderate Onset quality:  Gradual Duration:  4 days Timing:  Intermittent Progression:  Worsening Chronicity:  Chronic Context: weather changes   Relieved by:  Nothing Ineffective treatments:  Rest, oxygen and inhaler Associated symptoms: chest pain, cough and sputum production   Associated symptoms: no abdominal pain, no diaphoresis, no fever, no hemoptysis, no neck pain, no syncope, no vomiting and no wheezing     Past Medical History  Diagnosis Date  . Asthma   . COPD (chronic obstructive pulmonary disease)   . Bronchitis   . Thyroid disease   . Hypertension   . Hyperlipidemia   . Diabetes mellitus    Past Surgical History  Procedure Laterality Date  . Hernia repair    . Abdominal hysterectomy    . Stomach surgery      wound vac currently in place  . Esophagogastroduodenoscopy  11/11/2011    Procedure: ESOPHAGOGASTRODUODENOSCOPY (EGD);  Surgeon: Malissa Hippo, MD;  Location: [facility];  Service: Endoscopy;  Laterality: N/A;  . Foreign body removal  11/11/2011    Procedure: FOREIGN BODY REMOVAL;  Surgeon: Malissa Hippo, MD;  Location: [facility];  Service: Endoscopy;  Laterality: N/A;  . Tracheal surgery    . Abdominal surgery     Family History  Problem Relation Age of Onset  . Diabetes Mother    History  Substance Use Topics  . Smoking status: Current Some Day Smoker -- 1.50 packs/day for 35 years    Types: Cigarettes  . Smokeless tobacco: Never Used  . Alcohol Use: Yes     Comment: 1-2 beers nightly   OB History   Grav Para Term  Preterm Abortions TAB SAB Ect Mult Living   2 2 2       1      Review of Systems  Constitutional: Negative for fever, diaphoresis and activity change.       All ROS Neg except as noted in HPI  HENT: Negative for nosebleeds and neck pain.   Eyes: Negative for photophobia and discharge.  Respiratory: Positive for cough, sputum production and shortness of breath. Negative for hemoptysis and wheezing.   Cardiovascular: Positive for chest pain. Negative for palpitations and syncope.  Gastrointestinal: Negative for vomiting, abdominal pain and blood in stool.  Genitourinary: Negative for dysuria, frequency and hematuria.  Musculoskeletal: Negative for back pain and arthralgias.  Skin: Negative.   Neurological: Negative for dizziness, seizures and speech difficulty.  Psychiatric/Behavioral: Negative for hallucinations and confusion.    Allergies  Penicillins  Home Medications   Current Outpatient Rx  Name  Route  Sig  Dispense  Refill  . albuterol (PROVENTIL HFA;VENTOLIN HFA) 108 (90 BASE) MCG/ACT inhaler   Inhalation   Inhale 2 puffs into the lungs every 4 (four) hours as needed. Shortness of breath          . albuterol (PROVENTIL) 2 MG tablet   Oral   Take 2 mg by mouth 3 (three) times daily.           Marland Kitchen ALPRAZolam (XANAX) 1 MG tablet   Oral   Take  1 mg by mouth 3 (three) times daily.           . budesonide-formoterol (SYMBICORT) 80-4.5 MCG/ACT inhaler   Inhalation   Inhale 2 puffs into the lungs 2 (two) times daily.           . calcium carbonate (TUMS CALCIUM FOR LIFE BONE) 750 MG chewable tablet   Oral   Chew 2 tablets by mouth 2 (two) times daily.           . citalopram (CELEXA) 10 MG tablet   Oral   Take 10 mg by mouth daily.           . furosemide (LASIX) 40 MG tablet   Oral   Take 40 mg by mouth daily.           Marland Kitchen HYDROcodone-acetaminophen (LORTAB) 10-500 MG per tablet   Oral   Take 2 tablets by mouth every 4 (four) hours as needed. Pain           . ibuprofen (ADVIL,MOTRIN) 200 MG tablet   Oral   Take 400 mg by mouth every 6 (six) hours as needed. Pain          . levothyroxine (SYNTHROID, LEVOTHROID) 112 MCG tablet   Oral   Take 112 mcg by mouth daily.           . magnesium oxide (MAG-OX) 400 MG tablet   Oral   Take 400 mg by mouth 2 (two) times daily.           Marland Kitchen EXPIRED: pantoprazole (PROTONIX) 40 MG tablet   Oral   Take 1 tablet (40 mg total) by mouth daily.   30 tablet   5   . ramipril (ALTACE) 5 MG capsule   Oral   Take 5 mg by mouth daily.           . simvastatin (ZOCOR) 40 MG tablet   Oral   Take 40 mg by mouth at bedtime.           . theophylline (THEODUR) 300 MG 12 hr tablet   Oral   Take 600 mg by mouth 2 (two) times daily.           BP 146/68  Pulse 119  Temp(Src) 98.4 F (36.9 C) (Oral)  Resp 24  Ht 5\' 4"  (1.626 m)  Wt 198 lb (89.812 kg)  BMI 33.97 kg/m2  SpO2 86% Physical Exam  Nursing note and vitals reviewed. Constitutional: She is oriented to person, place, and time. She appears well-developed and well-nourished.  Non-toxic appearance. She appears ill.  HENT:  Head: Normocephalic.  Right Ear: Tympanic membrane and external ear normal.  Left Ear: Tympanic membrane and external ear normal.  Eyes: EOM and lids are normal. Pupils are equal, round, and reactive to light.  Neck: Normal range of motion. Neck supple. Carotid bruit is not present.  Cardiovascular: Normal rate, regular rhythm, normal heart sounds, intact distal pulses and normal pulses.   Pulmonary/Chest: Tachypnea noted. No respiratory distress. She has wheezes. She has rhonchi. She exhibits tenderness.  Abdominal: Soft. Bowel sounds are normal. There is no tenderness. There is no guarding.  Musculoskeletal: Normal range of motion.  No lower extremity edema. Neg Homan's sign.  Lymphadenopathy:       Head (right side): No submandibular adenopathy present.       Head (left side): No submandibular adenopathy present.     She has no cervical adenopathy.  Neurological: She is alert and oriented to  person, place, and time. She has normal strength. No cranial nerve deficit or sensory deficit.  Skin: Skin is warm and dry.  Psychiatric: She has a normal mood and affect. Her speech is normal.    ED Course  Procedures (including critical care time) Labs Review Labs Reviewed  TROPONIN I  CBC WITH DIFFERENTIAL  BASIC METABOLIC PANEL  D-DIMER, QUANTITATIVE   Imaging Review No results found.  Date: 08/27/2013  Rate: **116*  Rhythm: sinus tachycardia  QRS Axis: normal  Intervals: normal  ST/T Wave abnormalities: normal  Conduction Disutrbances:none  Narrative Interpretation: Poor R wave progression anteriorly. No STEMI.  Old EKG Reviewed: none available  MDM  No diagnosis found. *I have reviewed nursing notes, vital signs, and all appropriate lab and imaging results for this patient.** Patient states she felt minimal improvement in her breathing after nebulizer treatment and oxygen at 2 L per minute. The patient had a natruretic peptide done, which was elevated at 959. She had a troponin that was normal at less than 0.30. The complete blood count showed an elevation in white blood cells of 15,700, hemoglobin and hematocrit were both within normal limits. Platelets were normal at 225,000. The basic metabolic panel shows sodium to be slightly low at 131, the chloride to be low at 93, glucose was elevated at 159. The remainder of the basic metabolic panel is well within normal limits. A d-dimer test was elevated at 2.34. Patient seen with me by Dr. Patria Mane.  Patient had a CT Angiochest which reveals no evidence of acute pulmonary embolism, it does reveal small to moderate pericardial effusion, a subtle diffuse opacity with bilateral pleural effusions suggesting mild pulmonary edema.  Patient was treated with IV Lasix. She had only minimal diuresis. Patient stated however that she felt some better. Pulse  oximetry is remaining low from 86-94%. Review of the records however reveals that the patient usually has pulse oximetry in this range.  Patient is discharged home with prescription for Lasix 20 mg 2 times daily, Norco every 4 hours if needed for pain. Patient is to be rechecked in 48 hours.   Kathie Dike, PA-C 08/28/13 1826

## 2013-08-27 NOTE — ED Notes (Signed)
Pt states that she is breathing better after the breathing tx.

## 2013-08-27 NOTE — ED Notes (Signed)
pt c/o worsening of sob, states that she has copd and that she noticed that her breathing was becoming worse this past week requiring her to use her home oxygen more,  also has mid center chest pain described as sharp in nature that is worse with breathing, admits to cough that is productive with clear sputum production, sob is worse with movement, pt has been using breathing tx at home with no improvement in symptoms, pulse ox fluctuates between 86-92% on RA< pt placed on oxygen at 2lpm.

## 2013-08-29 NOTE — ED Provider Notes (Addendum)
Medical screening examination/treatment/procedure(s) were conducted as a shared visit with non-physician practitioner(s) and myself.  I personally evaluated the patient during the encounter  On my evaluation the patient is without any hypoxia.  She ambulated in the emergency department and  transiently dropped her O2 sats.  She has oxygen at home.  She would prefer to go home.  I don't think is unreasonable is in the patient with short course of Lasix and close PCP followup.  This with the patient would prefer.  She understands to return to the ER for new or worsening symptoms.  She understands she'll need additional workup as an outpatient  Lyanne Co, MD 08/29/13 4098  Lyanne Co, MD 08/31/13 1191  Lyanne Co, MD 09/08/13 [phone]

## 2013-09-08 ENCOUNTER — Emergency Department (HOSPITAL_COMMUNITY): Payer: Medicaid Other

## 2013-09-08 ENCOUNTER — Inpatient Hospital Stay (HOSPITAL_COMMUNITY)
Admission: EM | Admit: 2013-09-08 | Discharge: 2013-09-12 | DRG: 308 | Disposition: A | Discharge status: Home Health | Payer: Medicaid Other | Attending: Family Medicine | Admitting: Family Medicine

## 2013-09-08 ENCOUNTER — Other Ambulatory Visit: Payer: Self-pay

## 2013-09-08 ENCOUNTER — Encounter (HOSPITAL_COMMUNITY): Payer: Self-pay | Admitting: Emergency Medicine

## 2013-09-08 DIAGNOSIS — I4891 Unspecified atrial fibrillation: Principal | ICD-10-CM

## 2013-09-08 DIAGNOSIS — Z79899 Other long term (current) drug therapy: Secondary | ICD-10-CM

## 2013-09-08 DIAGNOSIS — I5031 Acute diastolic (congestive) heart failure: Secondary | ICD-10-CM | POA: Diagnosis present

## 2013-09-08 DIAGNOSIS — J4489 Other specified chronic obstructive pulmonary disease: Secondary | ICD-10-CM | POA: Diagnosis present

## 2013-09-08 DIAGNOSIS — E785 Hyperlipidemia, unspecified: Secondary | ICD-10-CM | POA: Diagnosis present

## 2013-09-08 DIAGNOSIS — J441 Chronic obstructive pulmonary disease with (acute) exacerbation: Secondary | ICD-10-CM | POA: Diagnosis present

## 2013-09-08 DIAGNOSIS — Z833 Family history of diabetes mellitus: Secondary | ICD-10-CM

## 2013-09-08 DIAGNOSIS — E039 Hypothyroidism, unspecified: Secondary | ICD-10-CM | POA: Diagnosis present

## 2013-09-08 DIAGNOSIS — E119 Type 2 diabetes mellitus without complications: Secondary | ICD-10-CM | POA: Diagnosis present

## 2013-09-08 DIAGNOSIS — I319 Disease of pericardium, unspecified: Secondary | ICD-10-CM

## 2013-09-08 DIAGNOSIS — I10 Essential (primary) hypertension: Secondary | ICD-10-CM | POA: Diagnosis present

## 2013-09-08 DIAGNOSIS — Z9981 Dependence on supplemental oxygen: Secondary | ICD-10-CM

## 2013-09-08 DIAGNOSIS — I252 Old myocardial infarction: Secondary | ICD-10-CM

## 2013-09-08 DIAGNOSIS — J449 Chronic obstructive pulmonary disease, unspecified: Secondary | ICD-10-CM

## 2013-09-08 DIAGNOSIS — F17200 Nicotine dependence, unspecified, uncomplicated: Secondary | ICD-10-CM | POA: Diagnosis present

## 2013-09-08 DIAGNOSIS — J9610 Chronic respiratory failure, unspecified whether with hypoxia or hypercapnia: Secondary | ICD-10-CM | POA: Diagnosis present

## 2013-09-08 DIAGNOSIS — F17210 Nicotine dependence, cigarettes, uncomplicated: Secondary | ICD-10-CM

## 2013-09-08 DIAGNOSIS — I509 Heart failure, unspecified: Secondary | ICD-10-CM

## 2013-09-08 DIAGNOSIS — D72829 Elevated white blood cell count, unspecified: Secondary | ICD-10-CM | POA: Diagnosis present

## 2013-09-08 DIAGNOSIS — I313 Pericardial effusion (noninflammatory): Secondary | ICD-10-CM

## 2013-09-08 DIAGNOSIS — E871 Hypo-osmolality and hyponatremia: Secondary | ICD-10-CM | POA: Diagnosis present

## 2013-09-08 HISTORY — DX: Essential (primary) hypertension: I10

## 2013-09-08 HISTORY — DX: Hypothyroidism, unspecified: E03.9

## 2013-09-08 HISTORY — DX: Type 2 diabetes mellitus without complications: E11.9

## 2013-09-08 HISTORY — DX: Other pericardial effusion (noninflammatory): I31.39

## 2013-09-08 HISTORY — DX: Pericardial effusion (noninflammatory): I31.3

## 2013-09-08 HISTORY — DX: Acute myocardial infarction, unspecified: I21.9

## 2013-09-08 HISTORY — DX: Unspecified atrial fibrillation: I48.91

## 2013-09-08 LAB — COMPREHENSIVE METABOLIC PANEL
Albumin: 3 g/dL — ABNORMAL LOW (ref 3.5–5.2)
BUN: 20 mg/dL (ref 6–23)
Chloride: 93 mEq/L — ABNORMAL LOW (ref 96–112)
Creatinine, Ser: 0.76 mg/dL (ref 0.50–1.10)
GFR calc Af Amer: >90 mL/min (ref >90)
Glucose, Bld: 102 mg/dL — ABNORMAL HIGH (ref 70–99)
Total Bilirubin: 0.3 mg/dL (ref 0.3–1.2)
Total Protein: 7.5 g/dL (ref 6.0–8.3)

## 2013-09-08 LAB — TROPONIN I
Troponin I: <0.3 ng/mL (ref <0.30)
Troponin I: <0.3 ng/mL (ref <0.30)

## 2013-09-08 LAB — PROTIME-INR
INR: 1.37 (ref 0.00–1.49)
Prothrombin Time: 16.5 seconds — ABNORMAL HIGH (ref 11.6–15.2)

## 2013-09-08 LAB — URINALYSIS, ROUTINE W REFLEX MICROSCOPIC
Bilirubin Urine: NEGATIVE
Glucose, UA: NEGATIVE mg/dL
Hgb urine dipstick: NEGATIVE
Ketones, ur: NEGATIVE mg/dL
Protein, ur: NEGATIVE mg/dL
Specific Gravity, Urine: 1.01 (ref 1.005–1.030)
Urobilinogen, UA: 0.2 mg/dL (ref 0.0–1.0)
pH: 6 (ref 5.0–8.0)

## 2013-09-08 LAB — CBC WITH DIFFERENTIAL/PLATELET
Basophils Absolute: 0 10*3/uL (ref 0.0–0.1)
Basophils Relative: 0 % (ref 0–1)
Eosinophils Absolute: 0.1 10*3/uL (ref 0.0–0.7)
HCT: 43 % (ref 36.0–46.0)
Hemoglobin: 14.2 g/dL (ref 12.0–15.0)
Lymphs Abs: 1.4 10*3/uL (ref 0.7–4.0)
MCH: 25.6 pg — ABNORMAL LOW (ref 26.0–34.0)
MCHC: 33 g/dL (ref 30.0–36.0)
Monocytes Absolute: 0.9 10*3/uL (ref 0.1–1.0)
Monocytes Relative: 7 % (ref 3–12)
Neutro Abs: 10 10*3/uL — ABNORMAL HIGH (ref 1.7–7.7)

## 2013-09-08 LAB — GLUCOSE, CAPILLARY
Glucose-Capillary: 116 mg/dL — ABNORMAL HIGH (ref 70–99)
Glucose-Capillary: 86 mg/dL (ref 70–99)
Glucose-Capillary: 124 mg/dL — ABNORMAL HIGH (ref 70–99)

## 2013-09-08 MED ORDER — FUROSEMIDE 10 MG/ML IJ SOLN
20.0000 mg | Freq: Two times a day (BID) | INTRAMUSCULAR | Status: AC
  Administered 2013-09-08 – 2013-09-09 (×3): 20 mg via INTRAVENOUS
  Filled 2013-09-08 (×3): qty 2

## 2013-09-08 MED ORDER — DILTIAZEM HCL 25 MG/5ML IV SOLN
10.0000 mg | Freq: Once | INTRAVENOUS | Status: AC
  Administered 2013-09-08: 10 mg via INTRAVENOUS

## 2013-09-08 MED ORDER — LEVOTHYROXINE SODIUM 112 MCG PO TABS
112.0000 ug | ORAL_TABLET | Freq: Every day | ORAL | Status: DC
  Administered 2013-09-09 – 2013-09-12 (×4): 112 ug via ORAL
  Filled 2013-09-08 (×6): qty 1

## 2013-09-08 MED ORDER — INSULIN ASPART 100 UNIT/ML ~~LOC~~ SOLN
0.0000 [IU] | Freq: Three times a day (TID) | SUBCUTANEOUS | Status: DC
  Administered 2013-09-08: 1 [IU] via SUBCUTANEOUS
  Administered 2013-09-09: 2 [IU] via SUBCUTANEOUS
  Administered 2013-09-09 – 2013-09-10 (×5): 1 [IU] via SUBCUTANEOUS
  Administered 2013-09-11: 2 [IU] via SUBCUTANEOUS
  Administered 2013-09-11: 1 [IU] via SUBCUTANEOUS
  Administered 2013-09-11: 2 [IU] via SUBCUTANEOUS
  Administered 2013-09-12: 1 [IU] via SUBCUTANEOUS
  Administered 2013-09-12: 5 [IU] via SUBCUTANEOUS

## 2013-09-08 MED ORDER — DILTIAZEM HCL 100 MG IV SOLR
5.0000 mg/h | INTRAVENOUS | Status: DC
  Administered 2013-09-08: 5 mg/h via INTRAVENOUS
  Filled 2013-09-08: qty 100

## 2013-09-08 MED ORDER — SODIUM CHLORIDE 0.9 % IJ SOLN
3.0000 mL | Freq: Two times a day (BID) | INTRAMUSCULAR | Status: DC
  Administered 2013-09-09 – 2013-09-11 (×5): 3 mL via INTRAVENOUS

## 2013-09-08 MED ORDER — SODIUM CHLORIDE 0.9 % IJ SOLN: 3.0000 mL | INTRAMUSCULAR | Status: DC | PRN

## 2013-09-08 MED ORDER — HEPARIN (PORCINE) IN NACL 100-0.45 UNIT/ML-% IJ SOLN
1800.0000 [IU]/h | INTRAMUSCULAR | Status: DC
  Administered 2013-09-08: 1550 [IU]/h via INTRAVENOUS
  Administered 2013-09-09: 1800 [IU]/h via INTRAVENOUS
  Filled 2013-09-08 (×2): qty 250

## 2013-09-08 MED ORDER — CITALOPRAM HYDROBROMIDE 20 MG PO TABS
10.0000 mg | ORAL_TABLET | Freq: Every day | ORAL | Status: DC
  Administered 2013-09-09 – 2013-09-12 (×4): 10 mg via ORAL
  Filled 2013-09-08 (×4): qty 1

## 2013-09-08 MED ORDER — ONDANSETRON HCL 4 MG/2ML IJ SOLN: 4.0000 mg | Freq: Four times a day (QID) | INTRAMUSCULAR | Status: DC | PRN

## 2013-09-08 MED ORDER — ALBUTEROL SULFATE (5 MG/ML) 0.5% IN NEBU
2.5000 mg | INHALATION_SOLUTION | RESPIRATORY_TRACT | Status: DC | PRN
  Administered 2013-09-08 – 2013-09-09 (×3): 2.5 mg via RESPIRATORY_TRACT
  Filled 2013-09-08 (×3): qty 0.5

## 2013-09-08 MED ORDER — BIOTENE DRY MOUTH MT LIQD
15.0000 mL | Freq: Two times a day (BID) | OROMUCOSAL | Status: DC
  Administered 2013-09-09 – 2013-09-11 (×5): 15 mL via OROMUCOSAL

## 2013-09-08 MED ORDER — WARFARIN SODIUM 7.5 MG PO TABS
7.5000 mg | ORAL_TABLET | Freq: Once | ORAL | Status: AC
  Administered 2013-09-08: 7.5 mg via ORAL
  Filled 2013-09-08: qty 1

## 2013-09-08 MED ORDER — LEVOFLOXACIN 500 MG PO TABS
500.0000 mg | ORAL_TABLET | Freq: Every day | ORAL | Status: DC
  Administered 2013-09-08 – 2013-09-12 (×5): 500 mg via ORAL
  Filled 2013-09-08 (×5): qty 1

## 2013-09-08 MED ORDER — ACETAMINOPHEN 325 MG PO TABS: 650.0000 mg | ORAL_TABLET | ORAL | Status: DC | PRN

## 2013-09-08 MED ORDER — SODIUM CHLORIDE 0.9 % IV SOLN
250.0000 mL | INTRAVENOUS | Status: DC | PRN
  Administered 2013-09-09: 17:00:00 via INTRAVENOUS

## 2013-09-08 MED ORDER — WARFARIN - PHARMACIST DOSING INPATIENT: Status: DC

## 2013-09-08 MED ORDER — BUDESONIDE-FORMOTEROL FUMARATE 80-4.5 MCG/ACT IN AERO
2.0000 | INHALATION_SPRAY | Freq: Two times a day (BID) | RESPIRATORY_TRACT | Status: DC
  Administered 2013-09-08 – 2013-09-12 (×8): 2 via RESPIRATORY_TRACT
  Filled 2013-09-08: qty 6.9

## 2013-09-08 MED ORDER — NICOTINE 21 MG/24HR TD PT24
21.0000 mg | MEDICATED_PATCH | Freq: Every day | TRANSDERMAL | Status: DC
  Administered 2013-09-08 – 2013-09-11 (×4): 21 mg via TRANSDERMAL
  Filled 2013-09-08 (×4): qty 1

## 2013-09-08 MED ORDER — PATIENT'S GUIDE TO USING COUMADIN BOOK
Freq: Once | Status: AC
  Administered 2013-09-11: 09:00:00
  Filled 2013-09-08: qty 1

## 2013-09-08 MED ORDER — RAMIPRIL 2.5 MG PO CAPS
5.0000 mg | ORAL_CAPSULE | Freq: Every day | ORAL | Status: DC
  Administered 2013-09-09 – 2013-09-12 (×4): 5 mg via ORAL
  Filled 2013-09-08 (×5): qty 2

## 2013-09-08 MED ORDER — ALPRAZOLAM 1 MG PO TABS
1.0000 mg | ORAL_TABLET | Freq: Three times a day (TID) | ORAL | Status: DC | PRN
  Administered 2013-09-08 – 2013-09-12 (×6): 1 mg via ORAL
  Filled 2013-09-08 (×5): qty 1
  Filled 2013-09-08: qty 2

## 2013-09-08 MED ORDER — METHYLPREDNISOLONE SODIUM SUCC 40 MG IJ SOLR
40.0000 mg | Freq: Two times a day (BID) | INTRAMUSCULAR | Status: DC
  Administered 2013-09-08: 40 mg via INTRAVENOUS
  Filled 2013-09-08: qty 1

## 2013-09-08 MED ORDER — BUDESONIDE-FORMOTEROL FUMARATE 80-4.5 MCG/ACT IN AERO
INHALATION_SPRAY | RESPIRATORY_TRACT | Status: AC
  Filled 2013-09-08: qty 6.9

## 2013-09-08 MED ORDER — INSULIN ASPART 100 UNIT/ML ~~LOC~~ SOLN
0.0000 [IU] | Freq: Every day | SUBCUTANEOUS | Status: DC
  Administered 2013-09-10 – 2013-09-11 (×2): 2 [IU] via SUBCUTANEOUS

## 2013-09-08 MED ORDER — PANTOPRAZOLE SODIUM 40 MG PO TBEC
40.0000 mg | DELAYED_RELEASE_TABLET | Freq: Every morning | ORAL | Status: DC
  Administered 2013-09-09 – 2013-09-12 (×4): 40 mg via ORAL
  Filled 2013-09-08 (×4): qty 1

## 2013-09-08 MED ORDER — WARFARIN VIDEO
Freq: Once | Status: AC
  Administered 2013-09-08: 18:00:00

## 2013-09-08 MED ORDER — HYDROCODONE-ACETAMINOPHEN 5-325 MG PO TABS
1.0000 | ORAL_TABLET | ORAL | Status: DC | PRN
  Administered 2013-09-08: 1 via ORAL
  Administered 2013-09-08 – 2013-09-09 (×4): 2 via ORAL
  Administered 2013-09-10: 1 via ORAL
  Administered 2013-09-10 – 2013-09-11 (×2): 2 via ORAL
  Administered 2013-09-11 (×2): 1 via ORAL
  Administered 2013-09-12: 2 via ORAL
  Filled 2013-09-08: qty 2
  Filled 2013-09-08 (×2): qty 1
  Filled 2013-09-08: qty 2
  Filled 2013-09-08: qty 1
  Filled 2013-09-08: qty 2
  Filled 2013-09-08: qty 1
  Filled 2013-09-08 (×2): qty 2
  Filled 2013-09-08 (×2): qty 1
  Filled 2013-09-08: qty 2

## 2013-09-08 MED ORDER — ASPIRIN EC 81 MG PO TBEC
81.0000 mg | DELAYED_RELEASE_TABLET | Freq: Every day | ORAL | Status: DC
  Administered 2013-09-08 – 2013-09-11 (×4): 81 mg via ORAL
  Filled 2013-09-08 (×4): qty 1

## 2013-09-08 MED ORDER — ALBUTEROL SULFATE (5 MG/ML) 0.5% IN NEBU
5.0000 mg | INHALATION_SOLUTION | Freq: Once | RESPIRATORY_TRACT | Status: AC
  Administered 2013-09-08: 5 mg via RESPIRATORY_TRACT
  Filled 2013-09-08: qty 1

## 2013-09-08 MED ORDER — LIVING BETTER WITH HEART FAILURE BOOK
Freq: Once | Status: AC
  Administered 2013-09-09: 05:00:00
  Filled 2013-09-08: qty 1

## 2013-09-08 MED ORDER — HEPARIN BOLUS VIA INFUSION
4000.0000 [IU] | Freq: Once | INTRAVENOUS | Status: AC
  Administered 2013-09-08: 4000 [IU] via INTRAVENOUS
  Filled 2013-09-08: qty 4000

## 2013-09-08 MED ORDER — IPRATROPIUM BROMIDE 0.02 % IN SOLN
0.5000 mg | Freq: Once | RESPIRATORY_TRACT | Status: AC
  Administered 2013-09-08: 0.5 mg via RESPIRATORY_TRACT
  Filled 2013-09-08: qty 2.5

## 2013-09-08 MED ORDER — DILTIAZEM HCL 100 MG IV SOLR
5.0000 mg/h | INTRAVENOUS | Status: DC
  Administered 2013-09-08: 15 mg/h via INTRAVENOUS
  Filled 2013-09-08: qty 100

## 2013-09-08 MED ORDER — HEPARIN BOLUS VIA INFUSION
2000.0000 [IU] | Freq: Once | INTRAVENOUS | Status: AC
  Administered 2013-09-08: 2000 [IU] via INTRAVENOUS
  Filled 2013-09-08: qty 2000

## 2013-09-08 MED ORDER — GABAPENTIN 100 MG PO CAPS
100.0000 mg | ORAL_CAPSULE | Freq: Three times a day (TID) | ORAL | Status: DC
  Administered 2013-09-08 – 2013-09-12 (×12): 100 mg via ORAL
  Filled 2013-09-08 (×13): qty 1

## 2013-09-08 MED ORDER — SIMVASTATIN 20 MG PO TABS
40.0000 mg | ORAL_TABLET | Freq: Every day | ORAL | Status: DC
  Administered 2013-09-08: 40 mg via ORAL
  Filled 2013-09-08: qty 2

## 2013-09-08 MED ORDER — FUROSEMIDE 10 MG/ML IJ SOLN
40.0000 mg | Freq: Once | INTRAMUSCULAR | Status: AC
  Administered 2013-09-08: 40 mg via INTRAVENOUS
  Filled 2013-09-08: qty 4

## 2013-09-08 NOTE — Progress Notes (Signed)
ANTICOAGULATION CONSULT NOTE   Pharmacy Consult for Heparin and Coumadin Indication: atrial fibrillation  Allergies  Allergen Reactions  . Bactrim [Sulfamethoxazole-Tmp Ds] Hives  . Erythromycin Hives  . Keflex [Cephalexin] Hives  . Penicillins Itching and Swelling    Sweating   Patient Measurements: Height: 5\' 4"  (162.6 cm) Weight: 192 lb 0.3 oz (87.1 kg) IBW/kg (Calculated) : 54.7  Vital Signs: Temp: 99.1 F (37.3 C) (10/17 1959) Temp src: Oral (10/17 1959) BP: 82/56 mmHg (10/17 1900) Pulse Rate: 96 (10/17 1900)  Labs:  Recent Labs  09/08/13 1011 09/08/13 1526 09/08/13 1527 09/08/13 2046  HGB 14.2  --   --   --   HCT 43.0  --   --   --   PLT 361  --   --   --   LABPROT  --   --  16.5*  --   INR  --   --  1.37  --   HEPARINUNFRC  --   --   --  <0.10*  CREATININE 0.76  --   --   --   TROPONINI <0.30 <0.30  --  <0.30   Estimated Creatinine Clearance: 82.9 ml/min (by C-G formula based on Cr of 0.76).  Medical History: Past Medical History  Diagnosis Date  . Asthma   . COPD (chronic obstructive pulmonary disease)   . Hypothyroidism   . Essential hypertension, benign   . Hyperlipidemia   . Type 2 diabetes mellitus   . Atrial fibrillation   . MI (myocardial infarction)     2012   Medications:  Scheduled:  . antiseptic oral rinse  15 mL Mouth Rinse BID  . aspirin EC  81 mg Oral Daily  . budesonide-formoterol  2 puff Inhalation BID  . citalopram  10 mg Oral Daily  . furosemide  20 mg Intravenous Q12H  . gabapentin  100 mg Oral TID  . heparin  2,000 Units Intravenous Once  . insulin aspart  0-5 Units Subcutaneous QHS  . insulin aspart  0-9 Units Subcutaneous TID WC  . levofloxacin  500 mg Oral Daily  . levothyroxine  112 mcg Oral QAC breakfast  . Living Better with Heart Failure Book   Does not apply Once  . methylPREDNISolone (SOLU-MEDROL) injection  40 mg Intravenous Q12H  . nicotine  21 mg Transdermal Daily  . pantoprazole  40 mg Oral q morning -  10a  . patient's guide to using coumadin book   Does not apply Once  . ramipril  5 mg Oral Daily  . simvastatin  40 mg Oral QHS  . sodium chloride  3 mL Intravenous Q12H  . Warfarin - Pharmacist Dosing Inpatient   Does not apply Q24H   Assessment: 57yo female with new onset afib.  Asked to initiate Heparin and Coumadin anticoagulation.  Heparin level is below goal.  Goal of Therapy:  INR 2-3 Monitor platelets by anticoagulation protocol: Yes Heparin level 0.3-0.7   Plan:  Heparin 2000 unit re-bolus then 1800 units/hr Check heparin level in 6-8 hrs then daily CBC daily Overlap Heparin/Coumadin until INR > 2  Joanna Perez, Joanna Perez 09/08/2013,11:30 PM

## 2013-09-08 NOTE — Progress Notes (Signed)
*  PRELIMINARY RESULTS* Echocardiogram 2D Echocardiogram has been performed.  Joanna Perez 09/08/2013, 4:51 PM

## 2013-09-08 NOTE — Progress Notes (Signed)
Cardiology called and made aware of consult.

## 2013-09-08 NOTE — Progress Notes (Signed)
ANTICOAGULATION CONSULT NOTE - Initial Consult  Pharmacy Consult for Heparin and Coumadin Indication: atrial fibrillation  Allergies  Allergen Reactions  . Bactrim [Sulfamethoxazole-Tmp Ds] Hives  . Erythromycin Hives  . Keflex [Cephalexin] Hives  . Penicillins Itching and Swelling    Sweating   Patient Measurements: Height: 5\' 4"  (162.6 cm) Weight: 192 lb 0.3 oz (87.1 kg) IBW/kg (Calculated) : 54.7  Vital Signs: Temp: 97.8 F (36.6 C) (10/17 1315) Temp src: Axillary (10/17 1315) BP: 102/51 mmHg (10/17 1615) Pulse Rate: 103 (10/17 1615)  Labs:  Recent Labs  09/08/13 1011 09/08/13 1526 09/08/13 1527  HGB 14.2  --   --   HCT 43.0  --   --   PLT 361  --   --   LABPROT  --   --  16.5*  INR  --   --  1.37  CREATININE 0.76  --   --   TROPONINI <0.30 <0.30  --    Estimated Creatinine Clearance: 82.9 ml/min (by C-G formula based on Cr of 0.76).  Medical History: Past Medical History  Diagnosis Date  . Asthma   . COPD (chronic obstructive pulmonary disease)   . Hypothyroidism   . Essential hypertension, benign   . Hyperlipidemia   . Type 2 diabetes mellitus   . Atrial fibrillation   . MI (myocardial infarction)     2012   Medications:  Scheduled:  . antiseptic oral rinse  15 mL Mouth Rinse BID  . aspirin EC  81 mg Oral Daily  . citalopram  10 mg Oral Daily  . furosemide  20 mg Intravenous Q12H  . gabapentin  100 mg Oral TID  . insulin aspart  0-5 Units Subcutaneous QHS  . insulin aspart  0-9 Units Subcutaneous TID WC  . levofloxacin  500 mg Oral Daily  . levothyroxine  112 mcg Oral QAC breakfast  . Living Better with Heart Failure Book   Does not apply Once  . methylPREDNISolone (SOLU-MEDROL) injection  40 mg Intravenous Q12H  . nicotine  21 mg Transdermal Daily  . pantoprazole  40 mg Oral q morning - 10a  . patient's guide to using coumadin book   Does not apply Once  . ramipril  5 mg Oral Daily  . simvastatin  40 mg Oral QHS  . sodium chloride  3 mL  Intravenous Q12H  . warfarin  7.5 mg Oral Once  . warfarin   Does not apply Once  . Warfarin - Pharmacist Dosing Inpatient   Does not apply Q24H    Assessment: 57yo female with new onset afib.  Asked to initiate Heparin and Coumadin anticoagulation.  Goal of Therapy:  INR 2-3 Monitor platelets by anticoagulation protocol: Yes Heparin level 0.3-0.7   Plan:  Heparin 4000 unit bolus then 1550 units/hr Check heparin level in 6-8 hrs then daily Coumadin 7.5mg  po today x 1 INR and CBC daily Overlap Heparin/Coumadin until INR > 2  Joanna Perez, Joanna Perez 09/08/2013,4:32 PM

## 2013-09-08 NOTE — Progress Notes (Signed)
UR chart review completed.  

## 2013-09-08 NOTE — H&P (Signed)
History and Physical  Joanna Perez:[id number] DOB: 1956-02-09 DOA: 09/08/2013  Referring physician: Dr. Florestine Avers in ED PCP: Cassell Smiles., MD   Chief Complaint: SOB  HPI:  57 year old woman presented to the emergency department with increasing shortness of breath and heart palpitations. Initial evaluation in the emergency department was notable for atrial fibrillation with rapid ventricular response and suggested pulmonary edema, possible heart failure, COPD exacerbation.  History obtained from chart review, patient as well as son and husband at bedside.  She has a history of chronic respiratory failure maintained on oxygen at home, COPD with ongoing cigarette smoking and possible history of MI during a prolonged hospitalization 2012 for abdominal infection. No history of arrhythmia. She was seen in the emergency department 10/5 for shortness of breath and hypoxia. She improved with Lasix, was discharged home on oral Lasix. CT angiogram at that time was negative for pulmonary embolism but did reveal a small to moderate pericardial effusion and possible mild pulmonary edema.  She saw her primary care physician in the office 10/70, was given a steroid injection and ciprofloxacin.  Despite these treatment intervention she has failed to improve and is had increasing shortness of breath over the last 2 weeks without specific aggravating or alleviating factors. No chest pain. No lower extremity edema. She does feel like she has gained weight but cannot quantify. This morning she was much more short of breath and developed chest palpitations and so came to the hospital.  In ED afebrile, heart rate up to 140s, normotensive, minimal hypoxia. Minimal hyponatremia, otherwise complete metabolic panel unremarkable. BNP 1725. Troponin negative. CBC mild leukocytosis. Chest x-ray suggests edema.  Review of Systems:  Negative for fever, visual changes, sore throat, rash, new muscle aches, chest  pain, dysuria, bleeding, n/v/abdominal pain.  Past Medical History  Diagnosis Date  . Asthma   . COPD (chronic obstructive pulmonary disease)   . Bronchitis   . Thyroid disease   . Hypertension   . Hyperlipidemia   . Diabetes mellitus     Past Surgical History  Procedure Laterality Date  . Hernia repair    . Abdominal hysterectomy    . Stomach surgery      wound vac currently in place  . Esophagogastroduodenoscopy  11/11/2011    Procedure: ESOPHAGOGASTRODUODENOSCOPY (EGD);  Surgeon: Malissa Hippo, MD;  Location: [facility];  Service: Endoscopy;  Laterality: N/A;  . Foreign body removal  11/11/2011    Procedure: FOREIGN BODY REMOVAL;  Surgeon: Malissa Hippo, MD;  Location: [facility];  Service: Endoscopy;  Laterality: N/A;  . Tracheal surgery    . Abdominal surgery      Social History:  reports that she has been smoking Cigarettes.  She has a 52.5 pack-year smoking history. She has never used smokeless tobacco. She reports that she drinks alcohol. She reports that she does not use illicit drugs.  Allergies  Allergen Reactions  . Bactrim [Sulfamethoxazole-Tmp Ds]   . Erythromycin   . Keflex [Cephalexin]   . Penicillins Itching and Swelling    Sweating    Family History  Problem Relation Age of Onset  . Diabetes Mother      Prior to Admission medications   Medication Sig Start Date End Date Taking? Authorizing Provider  albuterol (PROVENTIL HFA;VENTOLIN HFA) 108 (90 BASE) MCG/ACT inhaler Inhale 2 puffs into the lungs every 4 (four) hours as needed. Shortness of breath     Historical Provider, MD  albuterol (PROVENTIL) 2 MG tablet Take  2 mg by mouth 3 (three) times daily.      Historical Provider, MD  albuterol-ipratropium (COMBIVENT) 18-103 MCG/ACT inhaler Inhale 2 puffs into the lungs every 6 (six) hours as needed for wheezing or shortness of breath.    Historical Provider, MD  ALPRAZolam Prudy Feeler) 1 MG tablet Take 1 mg by mouth 3 (three) times daily.       Historical Provider, MD  budesonide-formoterol (SYMBICORT) 80-4.5 MCG/ACT inhaler Inhale 2 puffs into the lungs 2 (two) times daily.      Historical Provider, MD  citalopram (CELEXA) 10 MG tablet Take 10 mg by mouth daily.      Historical Provider, MD  furosemide (LASIX) 20 MG tablet Take 1 tablet (20 mg total) by mouth 2 (two) times daily. 08/27/13   Kathie Dike, PA-C  furosemide (LASIX) 40 MG tablet Take 40 mg by mouth daily as needed for fluid or edema.     Historical Provider, MD  gabapentin (NEURONTIN) 100 MG capsule Take 100 mg by mouth 3 (three) times daily.    Historical Provider, MD  HYDROcodone-acetaminophen (NORCO/VICODIN) 5-325 MG per tablet 1 OR 2 PO Q4H PRN PAIN 08/27/13   Kathie Dike, PA-C  levothyroxine (SYNTHROID, LEVOTHROID) 112 MCG tablet Take 112 mcg by mouth daily.      Historical Provider, MD  pantoprazole (PROTONIX) 40 MG tablet Take 40 mg by mouth every morning.    Historical Provider, MD  ramipril (ALTACE) 5 MG capsule Take 5 mg by mouth daily.      Historical Provider, MD  simvastatin (ZOCOR) 40 MG tablet Take 40 mg by mouth at bedtime.      Historical Provider, MD  theophylline (THEODUR) 300 MG 12 hr tablet Take 600 mg by mouth 2 (two) times daily.     Historical Provider, MD  vitamin C (ASCORBIC ACID) 500 MG tablet Take 500 mg by mouth every morning.    Historical Provider, MD   Physical Exam: Filed Vitals:   09/08/13 1007 09/08/13 1057 09/08/13 1118  BP: 108/56  101/61  Pulse: 109  115  Temp: 97.8 F (36.6 C)    TempSrc: Oral    Resp: 23  22  SpO2: 91% 94% 91%    General: Examined in the emergency department.  Appears calm and comfortable. Nontoxic. Appears older than stated age. Eyes: PERRL, normal lids, irises  ENT: grossly normal hearing, lips & tongue Neck: no LAD, masses or thyromegaly , well-healed tracheostomy. Cardiovascular: Tachycardic, regular rhythm, no murmur, rub, gallop. No lower extremity edema. Respiratory: Diffuse wheezes  bilaterally. Some posterior rales. No rhonchi. Mild increased respiratory effort. Abdomen: soft, ntnd, obese Skin: no rash or induration seen  Musculoskeletal: grossly normal tone BUE/BLE Psychiatric: grossly normal mood and affect, speech fluent and appropriate Neurologic: grossly non-focal.  Wt Readings from Last 3 Encounters:  08/27/13 89.812 kg (198 lb)  12/18/12 84.369 kg (186 lb)  11/11/11 79.742 kg (175 lb 12.8 oz)    Labs on Admission:  Basic Metabolic Panel:  Recent Labs Lab 09/08/13 1011  NA 133*  K 3.9  CL 93*  CO2 29  GLUCOSE 102*  BUN 20  CREATININE 0.76  CALCIUM 10.0    Liver Function Tests:  Recent Labs Lab 09/08/13 1011  AST 22  ALT 17  ALKPHOS 80  BILITOT 0.3  PROT 7.5  ALBUMIN 3.0*   CBC:  Recent Labs Lab 09/08/13 1011  WBC 12.4*  NEUTROABS 10.0*  HGB 14.2  HCT 43.0  MCV 77.5*  PLT 361  Cardiac Enzymes:  Recent Labs Lab 09/08/13 1011  TROPONINI <0.30    Recent Labs  08/27/13 1022 09/08/13 1011  PROBNP 959.3* 1725.0*   CBG:  Recent Labs Lab 09/08/13 1005  GLUCAP 116*   Radiological Exams on Admission: Dg Chest Portable 1 View  09/08/2013   CLINICAL DATA:  Shortness of breath. Asthma.  EXAM: PORTABLE CHEST - 1 VIEW  COMPARISON:  Chest radiograph 08/27/2013  FINDINGS: Stable cardiomegaly with calcification of the transverse thoracic aorta. Pulmonary vascular redistribution with bilateral interstitial pulmonary opacities most compatible with mild pulmonary edema. No definite large pleural effusion or pneumothorax.  IMPRESSION: Cardiomegaly, pulmonary venous hypertension and mild interstitial pulmonary edema.   Electronically Signed   By: Annia Belt M.D.   On: 09/08/2013 10:32    EKG: Independently reviewed. Atrial fibrillation with rapid ventricular response. Nonspecific ST changes. No acute changes seen.   Principal Problem:   Atrial fibrillation with rapid ventricular response Active Problems:   HYPOTHYROIDISM    DM   HYPERTENSION   COPD   Atrial fibrillation   Acute heart failure   Chronic respiratory failure   Cigarette smoker   Assessment/Plan 1. Atrial fibrillation with rapid ventricular response: New diagnosis, reportedly sinus tachycardia initially in the emergency department. CHADS-Vasc 4-5 depending on whether has had MI in the past. Plan as below. 2. Suspected acute heart failure, type unknown: May be rate related, further workup as below. She reportedly has a history of MI during a prolonged illness 2012 at [facility] for abdominal infection. 3. COPD, chronic respiratory failure: Possible acute exacerbation. Steroids, nebs, oxygen, antibiotics. 4. Small to moderate pericardial effusion by CT chest 12/5 5. Mild hyponatremia: Monitor clinically. Asymptomatic. Possibly secondary to Celexa. 6. Ongoing cigarette smoking: Recommend cessation. Discussed connection between COPD and ongoing smoking. 7. Hypothyroidism: Continue replacement therapy. 8. Diabetes mellitus: Diet controlled at home. Sliding scale insulin. 9. Hypertension, hyperlipidemia   Admit to step down.   Diltiazem infusion, check TSH, 2-D echocardiogram, heparin infusion, cardiology consultation for further recommendations. Check theophylline level  IV Lasix  Steroids, nebulizers, oxygen, antibiotics for COPD   Code Status: full code DVT prophylaxis: heparin Family Communication: Discussed with husband and son at bedside Disposition Plan/Anticipated LOS: admit, 2-3 days  Time spent: 73 minutes  Brendia Sacks, MD  [facility] Pager [phone] 09/08/2013, 12:04 PM

## 2013-09-08 NOTE — Consult Note (Signed)
Consulting cardiologist: Dr. Jonelle Sidle  Clinical Summary Joanna Perez is a 57 y.o.female with medical history outlined below admitted to the hospital with approximately two to three-week history of chest congestion, wheezing, and intermittent coughing. She has also noted intermittent sense of palpitations including forceful heartbeats and irregularity. This actually seems to be fairly long-standing, although now formally diagnosed with atrial fibrillation. She was noted to be in rapid atrial fibrillation at presentation, placed on intravenous Cardizem and heparin by the primary team, just recently spontaneously converted to sinus rhythm prior to my assessment in the ICU.  She did undergo a chest CT scan of the chest on October 6 demonstrating no pulmonary embolus with a small to moderate-sized pericardial effusion, subtle diffuse groundglass opacities and bilateral pleural effusions, also mild mediastinal lymphadenopathy.  She states that she follows with a pulmonologist and a Careers adviser at [facility].  She has a remote history of incisional herniorrhaphy with mesh repair years ago, subsequently has had significant problems with poorly healing wounds.  Details of reported myocardial infarction during time of acute illness back in 2012 are not certain.   Allergies  Allergen Reactions  . Bactrim [Sulfamethoxazole-Tmp Ds] Hives  . Erythromycin Hives  . Keflex [Cephalexin] Hives  . Penicillins Itching and Swelling    Sweating    Medications Scheduled Medications: . antiseptic oral rinse  15 mL Mouth Rinse BID  . aspirin EC  81 mg Oral Daily  . citalopram  10 mg Oral Daily  . furosemide  20 mg Intravenous Q12H  . gabapentin  100 mg Oral TID  . insulin aspart  0-5 Units Subcutaneous QHS  . insulin aspart  0-9 Units Subcutaneous TID WC  . levofloxacin  500 mg Oral Daily  . levothyroxine  112 mcg Oral QAC breakfast  . Living Better with Heart Failure Book    Does not apply Once  . methylPREDNISolone (SOLU-MEDROL) injection  40 mg Intravenous Q12H  . nicotine  21 mg Transdermal Daily  . pantoprazole  40 mg Oral q morning - 10a  . patient's guide to using coumadin book   Does not apply Once  . ramipril  5 mg Oral Daily  . simvastatin  40 mg Oral QHS  . sodium chloride  3 mL Intravenous Q12H  . warfarin  7.5 mg Oral Once  . warfarin   Does not apply Once  . Warfarin - Pharmacist Dosing Inpatient   Does not apply Q24H    Infusions: . diltiazem (CARDIZEM) infusion 15 mg/hr (09/08/13 1400)  . heparin 1,550 Units/hr (09/08/13 1501)    PRN Medications: sodium chloride, acetaminophen, ALPRAZolam, HYDROcodone-acetaminophen, ondansetron (ZOFRAN) IV, sodium chloride   Past Medical History  Diagnosis Date  . Asthma   . COPD (chronic obstructive pulmonary disease)   . Hypothyroidism   . Essential hypertension, benign   . Hyperlipidemia   . Type 2 diabetes mellitus   . Atrial fibrillation   . MI (myocardial infarction)     2012    Past Surgical History  Procedure Laterality Date  . Hernia repair    . Abdominal hysterectomy    . Stomach surgery      Wound vac currently in place  . Esophagogastroduodenoscopy  11/11/2011    Procedure: ESOPHAGOGASTRODUODENOSCOPY (EGD);  Surgeon: Malissa Hippo, MD;  Location: [facility];  Service: Endoscopy;  Laterality: N/A;  . Foreign body removal  11/11/2011    Procedure: FOREIGN BODY REMOVAL;  Surgeon: Malissa Hippo, MD;  Location: [facility];  Service: Endoscopy;  Laterality: N/A;  . Tracheal surgery    . Abdominal surgery      Family History  Problem Relation Age of Onset  . Diabetes Mother     Social History Joanna Perez reports that she has been smoking Cigarettes.  She has a 52.5 pack-year smoking history. She has never used smokeless tobacco. Joanna Perez reports that she drinks alcohol.  Review of Systems  Physical Examination Blood pressure 107/61, pulse 150, temperature 97.8  F (36.6 C), temperature source Axillary, resp. rate 22, height 5\' 4"  (1.626 m), weight 192 lb 0.3 oz (87.1 kg), SpO2 89.00%. No intake or output data in the 24 hours ending 09/08/13 1531  No acute distress, appears older than stated age. HEENT: Conjunctiva and lids normal, oropharynx clear with poor dentition. Neck: Supple, no elevated JVP or carotid bruits, no thyromegaly. Lungs: Diminished breath sounds without active wheezing, nonlabored breathing at rest. Cardiac: Regular rate and rhythm with ectopy, somewhat distant, no S3 or significant systolic murmur, no pericardial rub. Abdomen: Soft, nontender, bowel sounds present. Extremities: No pitting edema, distal pulses 1-2+. Skin: Warm and dry. Musculoskeletal: No kyphosis. Neuropsychiatric: Alert and oriented x3, affect grossly appropriate.   Lab Results  Basic Metabolic Panel:  Recent Labs Lab 09/08/13 1011  NA 133*  K 3.9  CL 93*  CO2 29  GLUCOSE 102*  BUN 20  CREATININE 0.76  CALCIUM 10.0    Liver Function Tests:  Recent Labs Lab 09/08/13 1011  AST 22  ALT 17  ALKPHOS 80  BILITOT 0.3  PROT 7.5  ALBUMIN 3.0*    CBC:  Recent Labs Lab 09/08/13 1011  WBC 12.4*  NEUTROABS 10.0*  HGB 14.2  HCT 43.0  MCV 77.5*  PLT 361    Cardiac Enzymes:  Recent Labs Lab 09/08/13 1011  TROPONINI <0.30    ECG Most recent tracing shows atrial fibrillation with rapid ventricular response, poor anterior R wave progression rule out old infarct pattern, nonspecific ST-T changes.   Impression  1. Newly documented atrial fibrillation, however could have been paroxysmal for some time now with patient reporting history of intermittent palpitations. She has spontaneously converted to sinus rhythm on intravenous Cardizem. CHADS 2 score is 2, and CHADSVASC score is 4. She is now on heparin and has been initiated on Coumadin by the primary team.  2. Mild to moderate sized pericardial effusion by recent CT scan of the  chest. Followup echocardiogram is pending to assess cardiac structure and function.  3. COPD, possible exacerbation also complicating above.  4. History of hypertension, Altace.  5. Type 2 diabetes mellitus.  6. Reported history of previous MI in the setting of acute illness 2012, details not clear.   Recommendations  For now would continue intravenous Cardizem and ultimately convert to a corresponding oral regimen. May need to stop Altace depending on her blood pressure response to the addition of new medications. As far as long-term stroke risk reduction with anticoagulation, may want to consider an NOAC instead of Heparin and initiation of Coumadin. Her renal function and hemoglobin are normal. Possibility of Xarelto or Eliquis would be options, and she could stop aspirin if either of these were chosen. Will followup on echocardiogram to assess pericardial effusion and also cardiac structure and function. Followup ECG in sinus rhythm.  Jonelle Sidle, M.D., F.A.C.C.

## 2013-09-08 NOTE — ED Notes (Signed)
Sob x 2 weeks with white phlegm. C/o upper chest pain that is sharp and sore from coughing. Alert/oreinted. Nad. Slight sob noted with accessory muscle use. Has been taking breathing tx's. Hx of copd. Seen pcp and was given steroids and antibiotic with no relief.

## 2013-09-08 NOTE — ED Provider Notes (Signed)
CSN: [id number]     Arrival date & time 09/08/13  1000 History  This chart was scribed for Joya Gaskins, MD by Karle Plumber, ED Scribe. This patient was seen in room APA10/APA10 and the patient's care was started at 10:26 AM.  Chief Complaint  Patient presents with  . Shortness of Breath   Patient is a 57 y.o. female presenting with shortness of breath. The history is provided by the patient. No language interpreter was used.  Shortness of Breath Associated symptoms: chest pain (secondary to cough) and cough    HPI Comments:  CILICIA BORDEN is a 57 y.o. female with h/o COPD and asthma who presents to the Emergency Department complaining of constant shortness of breath. She reports an intermittent productive cough of white mucus and sharp, intermittent chest pain secondary to cough. She reports presenting here approximately 2 weeks ago and was prescribed Lasix 60mg . She states she was given an inhaler that gives her no relief. She was then seen at her PCP and was prescribed steroids and Cipro with no relief. She denies any fever or leg swelling. Pt reports smoking intermittently. She states she is on home oxygen at 2 L/min daily.  Past Medical History  Diagnosis Date  . Asthma   . COPD (chronic obstructive pulmonary disease)   . Bronchitis   . Thyroid disease   . Hypertension   . Hyperlipidemia   . Diabetes mellitus   . Atrial fibrillation 09/08/2013  . MI (myocardial infarction)     2012   Past Surgical History  Procedure Laterality Date  . Hernia repair    . Abdominal hysterectomy    . Stomach surgery      wound vac currently in place  . Esophagogastroduodenoscopy  11/11/2011    Procedure: ESOPHAGOGASTRODUODENOSCOPY (EGD);  Surgeon: Malissa Hippo, MD;  Location: [facility];  Service: Endoscopy;  Laterality: N/A;  . Foreign body removal  11/11/2011    Procedure: FOREIGN BODY REMOVAL;  Surgeon: Malissa Hippo, MD;  Location: [facility];  Service: Endoscopy;   Laterality: N/A;  . Tracheal surgery    . Abdominal surgery     Family History  Problem Relation Age of Onset  . Diabetes Mother    History  Substance Use Topics  . Smoking status: Current Some Day Smoker -- 1.50 packs/day for 35 years    Types: Cigarettes  . Smokeless tobacco: Never Used  . Alcohol Use: Yes     Comment: 1-2 beers nightly   OB History   Grav Para Term Preterm Abortions TAB SAB Ect Mult Living   2 2 2       1      Review of Systems  HENT: Positive for congestion.   Respiratory: Positive for cough and shortness of breath.   Cardiovascular: Positive for chest pain (secondary to cough).  All other systems reviewed and are negative.    Allergies  Bactrim; Erythromycin; Keflex; and Penicillins  Home Medications   Current Outpatient Rx  Name  Route  Sig  Dispense  Refill  . albuterol (PROVENTIL HFA;VENTOLIN HFA) 108 (90 BASE) MCG/ACT inhaler   Inhalation   Inhale 2 puffs into the lungs every 4 (four) hours as needed. Shortness of breath          . albuterol (PROVENTIL) 2 MG tablet   Oral   Take 2 mg by mouth 3 (three) times daily.           Marland Kitchen albuterol-ipratropium (COMBIVENT) 18-103 MCG/ACT  inhaler   Inhalation   Inhale 2 puffs into the lungs every 6 (six) hours as needed for wheezing or shortness of breath.         . ALPRAZolam (XANAX) 1 MG tablet   Oral   Take 1 mg by mouth 3 (three) times daily.           . budesonide-formoterol (SYMBICORT) 80-4.5 MCG/ACT inhaler   Inhalation   Inhale 2 puffs into the lungs 2 (two) times daily.           . citalopram (CELEXA) 10 MG tablet   Oral   Take 10 mg by mouth daily.           . furosemide (LASIX) 20 MG tablet   Oral   Take 1 tablet (20 mg total) by mouth 2 (two) times daily.   14 tablet   0   . furosemide (LASIX) 40 MG tablet   Oral   Take 40 mg by mouth daily as needed for fluid or edema.          . gabapentin (NEURONTIN) 100 MG capsule   Oral   Take 100 mg by mouth 3 (three)  times daily.         Marland Kitchen HYDROcodone-acetaminophen (NORCO/VICODIN) 5-325 MG per tablet      1 OR 2 PO Q4H PRN PAIN   20 tablet   0   . levothyroxine (SYNTHROID, LEVOTHROID) 112 MCG tablet   Oral   Take 112 mcg by mouth daily.           . pantoprazole (PROTONIX) 40 MG tablet   Oral   Take 40 mg by mouth every morning.         . ramipril (ALTACE) 5 MG capsule   Oral   Take 5 mg by mouth daily.           . simvastatin (ZOCOR) 40 MG tablet   Oral   Take 40 mg by mouth at bedtime.           . theophylline (THEODUR) 300 MG 12 hr tablet   Oral   Take 600 mg by mouth 2 (two) times daily.          . vitamin C (ASCORBIC ACID) 500 MG tablet   Oral   Take 500 mg by mouth every morning.          Triage Vitals: BP 108/56  Pulse 109  Temp(Src) 97.8 F (36.6 C) (Oral)  Resp 23  SpO2 91% Physical Exam CONSTITUTIONAL: Well developed/well nourished HEAD: Normocephalic/atraumatic EYES: EOMI/PERRL ENMT: Mucous membranes moist NECK: supple no meningeal signs SPINE:entire spine nontender CV: S1/S2 noted, no murmurs/rubs/gallops noted LUNGS: wheezing bilaterally ABDOMEN: soft, nontender, no rebound or guarding GU:no cva tenderness NEURO: Pt is awake/alert, moves all extremitiesx4 EXTREMITIES: pulses normal, full ROM SKIN: warm, color normal PSYCH: no abnormalities of mood noted  ED Course  Procedures  CRITICAL CARE Performed by: Joya Gaskins Total critical care time: 40 Critical care time was exclusive of separately billable procedures and treating other patients. Critical care was necessary to treat or prevent imminent or life-threatening deterioration. Critical care was time spent personally by me on the following activities: development of treatment plan with patient and/or surrogate as well as nursing, discussions with consultants, evaluation of patient's response to treatment, examination of patient, obtaining history from patient or surrogate, ordering and  performing treatments and interventions, ordering and review of laboratory studies, ordering and review of radiographic studies, pulse oximetry and re-evaluation  of patient's condition.  DIAGNOSTIC STUDIES: Oxygen Saturation is 91% on [place] 2 L/min, low by my interpretation.   COORDINATION OF CARE: 10:31 AM- Will give breathing treatments and get CXR. Pt verbalizes understanding and agrees to plan.   12:24 PM Pt now is in atrial fibrillation with RVR, heart rate greater than 150 She denies known h/o Afib.  However she reports recent episodes of fluttering and palpitations Due to low BP, low dose of cardizem ordered as a drip Repeat SBP 120s Due to multiple medical issues and complexity of her case including new onset afib, chf and COPD, will need admission D/w dr Irene Limbo, will admit to stepdown unit Foley placed for strict I/O monitoring   Medications  diltiazem (CARDIZEM) 100 mg in dextrose 5 % 100 mL infusion (5 mg/hr Intravenous New Bag/Given 09/08/13 1148)  albuterol (PROVENTIL) (5 MG/ML) 0.5% nebulizer solution 5 mg (5 mg Nebulization Given 09/08/13 1056)  ipratropium (ATROVENT) nebulizer solution 0.5 mg (0.5 mg Nebulization Given 09/08/13 1057)  furosemide (LASIX) injection 40 mg (40 mg Intravenous Given 09/08/13 1106)    Labs Review Labs Reviewed  CBC WITH DIFFERENTIAL - Abnormal; Notable for the following:    WBC 12.4 (*)    RBC 5.55 (*)    MCV 77.5 (*)    MCH 25.6 (*)    Neutrophils Relative % 80 (*)    Neutro Abs 10.0 (*)    Lymphocytes Relative 11 (*)    All other components within normal limits  COMPREHENSIVE METABOLIC PANEL - Abnormal; Notable for the following:    Sodium 133 (*)    Chloride 93 (*)    Glucose, Bld 102 (*)    Albumin 3.0 (*)    All other components within normal limits  GLUCOSE, CAPILLARY - Abnormal; Notable for the following:    Glucose-Capillary 116 (*)    All other components within normal limits  PRO B NATRIURETIC PEPTIDE - Abnormal;  Notable for the following:    Pro B Natriuretic peptide (BNP) 1725.0 (*)    All other components within normal limits  URINE CULTURE  TROPONIN I  URINALYSIS, ROUTINE W REFLEX MICROSCOPIC   Imaging Review Dg Chest Portable 1 View  09/08/2013   CLINICAL DATA:  Shortness of breath. Asthma.  EXAM: PORTABLE CHEST - 1 VIEW  COMPARISON:  Chest radiograph 08/27/2013  FINDINGS: Stable cardiomegaly with calcification of the transverse thoracic aorta. Pulmonary vascular redistribution with bilateral interstitial pulmonary opacities most compatible with mild pulmonary edema. No definite large pleural effusion or pneumothorax.  IMPRESSION: Cardiomegaly, pulmonary venous hypertension and mild interstitial pulmonary edema.   Electronically Signed   By: Annia Belt M.D.   On: 09/08/2013 10:32    EKG Interpretation     Ventricular Rate:  157 PR Interval:    QRS Duration: 74 QT Interval:  266 QTC Calculation: 430 R Axis:   42 Text Interpretation:  Atrial fibrillation with rapid ventricular response with premature ventricular or aberrantly conducted complexes Cannot rule out Anterior infarct , age undetermined Abnormal ECG            MDM   1. Atrial fibrillation with rapid ventricular response   2. COPD (chronic obstructive pulmonary disease)   3. CHF (congestive heart failure)    Nursing notes including past medical history and social history reviewed and considered in documentation xrays reviewed and considered Labs/vital reviewed and considered     Date: 09/08/2013  Rate: 105  Rhythm: sinus tachycardia  QRS Axis: normal  Intervals: normal  ST/T  Wave abnormalities: nonspecific ST changes  Conduction Disutrbances:none     I personally performed the services described in this documentation, which was scribed in my presence. The recorded information has been reviewed and is accurate.      Joya Gaskins, MD 09/08/13 [phone]

## 2013-09-09 ENCOUNTER — Encounter (HOSPITAL_COMMUNITY): Payer: Self-pay | Admitting: Family Medicine

## 2013-09-09 DIAGNOSIS — I5031 Acute diastolic (congestive) heart failure: Secondary | ICD-10-CM

## 2013-09-09 DIAGNOSIS — I313 Pericardial effusion (noninflammatory): Secondary | ICD-10-CM

## 2013-09-09 DIAGNOSIS — I3139 Other pericardial effusion (noninflammatory): Secondary | ICD-10-CM

## 2013-09-09 LAB — PROTIME-INR
INR: 1.4 (ref 0.00–1.49)
Prothrombin Time: 16.8 seconds — ABNORMAL HIGH (ref 11.6–15.2)

## 2013-09-09 LAB — TSH: TSH: 2.356 u[IU]/mL (ref 0.350–4.500)

## 2013-09-09 LAB — URINE CULTURE: Colony Count: NO GROWTH

## 2013-09-09 LAB — BASIC METABOLIC PANEL
BUN: 24 mg/dL — ABNORMAL HIGH (ref 6–23)
CO2: 30 mEq/L (ref 19–32)
Chloride: 92 mEq/L — ABNORMAL LOW (ref 96–112)
Creatinine, Ser: 0.75 mg/dL (ref 0.50–1.10)
GFR calc Af Amer: >90 mL/min (ref >90)
Glucose, Bld: 189 mg/dL — ABNORMAL HIGH (ref 70–99)
Potassium: 3.8 mEq/L (ref 3.5–5.1)

## 2013-09-09 LAB — THEOPHYLLINE LEVEL: Theophylline Lvl: 17 ug/mL (ref 10.0–20.0)

## 2013-09-09 LAB — CBC
HCT: 42.6 % (ref 36.0–46.0)
Hemoglobin: 14 g/dL (ref 12.0–15.0)
MCH: 25.1 pg — ABNORMAL LOW (ref 26.0–34.0)
MCV: 76.5 fL — ABNORMAL LOW (ref 78.0–100.0)
RBC: 5.57 MIL/uL — ABNORMAL HIGH (ref 3.87–5.11)
WBC: 16.1 10*3/uL — ABNORMAL HIGH (ref 4.0–10.5)

## 2013-09-09 LAB — TROPONIN I: Troponin I: <0.3 ng/mL (ref <0.30)

## 2013-09-09 LAB — GLUCOSE, CAPILLARY
Glucose-Capillary: 176 mg/dL — ABNORMAL HIGH (ref 70–99)
Glucose-Capillary: 140 mg/dL — ABNORMAL HIGH (ref 70–99)
Glucose-Capillary: 137 mg/dL — ABNORMAL HIGH (ref 70–99)

## 2013-09-09 LAB — HEPARIN LEVEL (UNFRACTIONATED): Heparin Unfractionated: 0.27 IU/mL — ABNORMAL LOW (ref 0.30–0.70)

## 2013-09-09 LAB — HEMOGLOBIN A1C: Hgb A1c MFr Bld: 6.2 % — ABNORMAL HIGH (ref <5.7)

## 2013-09-09 MED ORDER — ALBUTEROL SULFATE (5 MG/ML) 0.5% IN NEBU
2.5000 mg | INHALATION_SOLUTION | Freq: Four times a day (QID) | RESPIRATORY_TRACT | Status: DC
  Administered 2013-09-09 – 2013-09-12 (×12): 2.5 mg via RESPIRATORY_TRACT
  Filled 2013-09-09 (×12): qty 0.5

## 2013-09-09 MED ORDER — ATORVASTATIN CALCIUM 20 MG PO TABS
20.0000 mg | ORAL_TABLET | Freq: Every day | ORAL | Status: DC
  Administered 2013-09-09 – 2013-09-11 (×3): 20 mg via ORAL
  Filled 2013-09-09 (×3): qty 1

## 2013-09-09 MED ORDER — FUROSEMIDE 20 MG PO TABS
20.0000 mg | ORAL_TABLET | Freq: Every day | ORAL | Status: DC
  Administered 2013-09-10 – 2013-09-12 (×3): 20 mg via ORAL
  Filled 2013-09-09 (×5): qty 1

## 2013-09-09 MED ORDER — METHYLPREDNISOLONE SODIUM SUCC 40 MG IJ SOLR
40.0000 mg | Freq: Four times a day (QID) | INTRAMUSCULAR | Status: DC
  Administered 2013-09-09 – 2013-09-10 (×4): 40 mg via INTRAVENOUS
  Filled 2013-09-09 (×5): qty 1

## 2013-09-09 MED ORDER — DILTIAZEM HCL 60 MG PO TABS
60.0000 mg | ORAL_TABLET | Freq: Four times a day (QID) | ORAL | Status: DC
  Administered 2013-09-09 – 2013-09-10 (×4): 60 mg via ORAL
  Filled 2013-09-09 (×4): qty 1

## 2013-09-09 NOTE — Progress Notes (Signed)
Pt to be transferred to room 328 per MD order. Report called to RN. Pt and family members aware of transfer. Pt transferred via wheelchair with personal belongings.

## 2013-09-09 NOTE — Progress Notes (Signed)
[facility] PROGRESS NOTE  Joanna Perez AVW:[id number] DOB: [birth year] DOA: 09/08/2013 PCP: Cassell Smiles., MD  Assessment/Plan: 1. Atrial fibrillation with rapid ventricular response: Converted to sinus rhythm yesterday afternoon. Change to oral diltiazem today. Hold off on anticoagulation for now as below. TSH normal. 2. Moderate sized circumferential pericardial effusion with some evidence of hemodynamic significance: Hold off on anticoagulation until course is completely stabilized as per cardiology. Plan on limited followup echo 10/21. Continue to monitor hemodynamics in the meantime. 3. Acute diastolic congestive heart failure: Appears clinically compensated at this point. Continue diltiazem. Change to oral Lasix. 4. COPD exacerbation superimposed on chronic respiratory failure: Continue treatment. 5. Mild hyponatremia: Stable, suspect secondary to Celexa. 6. Cigarette smoker: Recommend cessation. 7.  Hypothyroidism: TSH normal. Continue replacement therapy. 8. Diabetes mellitus: Diet controlled at home. Stable on sliding scale insulin.   Discontinue diltiazem infusion, start oral diltiazem  Monitor hemodynamics and signs or symptoms of worsening pericardial effusion  Repeat limited echo 10/21  SCDs  Change to oral Lasix  Continue management of COPD  Transfer to medical floor later today if stable  Pending studies:   Urine culture  Code Status: Full code DVT prophylaxis: SCDs Family Communication: None present Disposition Plan: Home when pericardial effusion stable  Brendia Sacks, MD  [facility]  Pager [phone] If 7PM-7AM, please contact night-coverage at [email or web address], password [facility] 09/09/2013, 8:40 AM  LOS: 1 day   Summary: 57 year old woman presented to the emergency department with increasing shortness of breath and heart palpitations. Initial evaluation in the emergency department was notable for atrial fibrillation with rapid  ventricular response and suggested pulmonary edema, possible heart failure, COPD exacerbation.  Consultants:  Cardiology   Procedures:  10/17 2-D echocardiogram: Left ventricular ejection fraction 50-55%. Grade 2 diastolic dysfunction. Moderate circumferential pericardial effusion with some features to suggest hemodynamic significance.  Antibiotics:  Levaquin 10/17 >>   HPI/Subjective: Converted to sinus rhythm yesterday afternoon. No new issues overnight. Still short of breath. No pain.  Objective: Filed Vitals:   09/09/13 0730 09/09/13 0745 09/09/13 0800 09/09/13 0815  BP: 105/54 121/77 121/61 122/57  Pulse: 71 82 162 80  Temp:      TempSrc:      Resp: 20 22 20 19   Height:      Weight:      SpO2: 90% 87% 84% 86%    Intake/Output Summary (Last 24 hours) at 09/09/13 0840 Last data filed at 09/09/13 0600  Gross per 24 hour  Intake 271.74 ml  Output   2300 ml  Net -2028.26 ml     Filed Weights   09/08/13 1304 09/09/13 0600  Weight: 87.1 kg (192 lb 0.3 oz) 86.637 kg (191 lb)    Exam:   Afebrile, vital signs stable. Hypoxia borderline.  General: Appears calm and comfortable.  Psychiatric: Grossly normal mood and affect. Speech fluent and appropriate.  Cardiovascular: Regular rate and rhythm. Heart sounds distant. No murmur, rub, gallop. No lower extremity edema.  Respiratory: Coarse breath sounds, fair air movement. No frank rhonchi or rales. Moderate increased respiratory effort.  Abdomen: Soft, nontender, nondistended.  Neck: Neck is full, I do not appreciate any JVD.  Skin: Appears grossly normal.  Data Reviewed:  2-D echocardiogram noted  Capillary blood sugars stable  Basic metabolic panel unremarkable.   Troponins negative  CBC notable for leukocytosis, on steroids  Scheduled Meds: . antiseptic oral rinse  15 mL Mouth Rinse BID  . aspirin EC  81 mg Oral Daily  . budesonide-formoterol  2 puff Inhalation BID  . citalopram  10 mg Oral  Daily  . furosemide  20 mg Intravenous Q12H  . gabapentin  100 mg Oral TID  . insulin aspart  0-5 Units Subcutaneous QHS  . insulin aspart  0-9 Units Subcutaneous TID WC  . levofloxacin  500 mg Oral Daily  . levothyroxine  112 mcg Oral QAC breakfast  . methylPREDNISolone (SOLU-MEDROL) injection  40 mg Intravenous Q12H  . nicotine  21 mg Transdermal Daily  . pantoprazole  40 mg Oral q morning - 10a  . patient's guide to using coumadin book   Does not apply Once  . ramipril  5 mg Oral Daily  . simvastatin  40 mg Oral QHS  . sodium chloride  3 mL Intravenous Q12H   Continuous Infusions: . diltiazem (CARDIZEM) infusion 10 mg/hr (09/09/13 0600)    Principal Problem:   Atrial fibrillation with rapid ventricular response Active Problems:   HYPOTHYROIDISM   DM   HYPERTENSION   COPD   Atrial fibrillation   Chronic respiratory failure   Cigarette smoker   Acute diastolic congestive heart failure   Pericardial effusion   Time spent 20  minutes

## 2013-09-09 NOTE — Progress Notes (Signed)
    Brief followup from yesterday's consultation. Please refer to the interval echocardiogram report. Since the patient has a moderate sized circumferential pericardial effusion with some evidence of hemodynamic significance, would suggest that you hold off on anticoagulation at this particular time until her clinical course has completely stabilized, and we are sure that the effusion is not going to continue to grow in size. Would therefore stop heparin and Coumadin.  Jonelle Sidle, M.D., F.A.C.C.

## 2013-09-10 LAB — GLUCOSE, CAPILLARY
Glucose-Capillary: 139 mg/dL — ABNORMAL HIGH (ref 70–99)
Glucose-Capillary: 132 mg/dL — ABNORMAL HIGH (ref 70–99)

## 2013-09-10 LAB — BASIC METABOLIC PANEL
Chloride: 95 mEq/L — ABNORMAL LOW (ref 96–112)
Creatinine, Ser: 0.66 mg/dL (ref 0.50–1.10)
GFR calc Af Amer: >90 mL/min (ref >90)
Glucose, Bld: 182 mg/dL — ABNORMAL HIGH (ref 70–99)

## 2013-09-10 MED ORDER — METHYLPREDNISOLONE SODIUM SUCC 40 MG IJ SOLR
40.0000 mg | Freq: Two times a day (BID) | INTRAMUSCULAR | Status: DC
  Administered 2013-09-10 – 2013-09-11 (×3): 40 mg via INTRAVENOUS
  Filled 2013-09-10 (×4): qty 1

## 2013-09-10 MED ORDER — DILTIAZEM HCL ER COATED BEADS 240 MG PO CP24
240.0000 mg | ORAL_CAPSULE | Freq: Every day | ORAL | Status: DC
  Administered 2013-09-10 – 2013-09-12 (×3): 240 mg via ORAL
  Filled 2013-09-10 (×3): qty 1

## 2013-09-10 MED ORDER — POLYETHYLENE GLYCOL 3350 17 G PO PACK
17.0000 g | PACK | Freq: Every day | ORAL | Status: DC
  Administered 2013-09-10 – 2013-09-12 (×3): 17 g via ORAL
  Filled 2013-09-10 (×3): qty 1

## 2013-09-10 MED ORDER — DOCUSATE SODIUM 100 MG PO CAPS
100.0000 mg | ORAL_CAPSULE | Freq: Two times a day (BID) | ORAL | Status: DC
  Administered 2013-09-10 – 2013-09-12 (×5): 100 mg via ORAL
  Filled 2013-09-10 (×5): qty 1

## 2013-09-10 NOTE — Progress Notes (Signed)
[facility] PROGRESS NOTE  Joanna Perez ZOX:[id number] DOB: 1956/06/10 DOA: 09/08/2013 PCP: Cassell Smiles., MD  Assessment/Plan: 1. Atrial fibrillation with rapid ventricular response: Remains in sinus rhythm. Continue diltiazem. Hold off on anticoagulation for now as below. TSH normal. 2. Moderate sized circumferential pericardial effusion with some evidence of hemodynamic significance: Hold off on anticoagulation until course is completely stabilized as per cardiology. Plan on limited followup echo 10/20. Continue to monitor hemodynamics in the meantime. 3. Acute diastolic congestive heart failure: Will compensated. Continue Lasix, diltiazem. 4. COPD exacerbation superimposed on chronic respiratory failure: Slowly improving. 5. Mild hyponatremia: Resolved. 6. Cigarette smoker: Recommend cessation. 7. Hypothyroidism: TSH normal. Continue replacement therapy. 8. Diabetes mellitus: Diet controlled at home. Stable on sliding scale insulin.   Change to long-acting diltiazem  Monitor hemodynamics and monitor for signs or symptoms of worsening pericardial effusion  Continue management of COPD, decrease steroids  Limited 2-D echocardiogram 10/20 to followup effusion  Pending studies:   None   Code Status: Full code DVT prophylaxis: SCDs Family Communication: None present Disposition Plan: Home when pericardial effusion stable  Brendia Sacks, MD  [facility]  Pager [phone] If 7PM-7AM, please contact night-coverage at [email or web address], password [facility] 09/10/2013, 9:09 AM  LOS: 2 days   Summary: 57 year old woman presented to the emergency department with increasing shortness of breath and heart palpitations. Initial evaluation in the emergency department was notable for atrial fibrillation with rapid ventricular response and suggested pulmonary edema, possible heart failure, COPD exacerbation.  Consultants:  Cardiology   Procedures:  10/17 2-D echocardiogram:  Left ventricular ejection fraction 50-55%. Grade 2 diastolic dysfunction. Moderate circumferential pericardial effusion with some features to suggest hemodynamic significance.  Antibiotics:  Levaquin 10/17 >>   HPI/Subjective: No issues overnight. Remains in sinus rhythm. Overall feeling somewhat better, perhaps some mild improvement in breathing.  Objective: Filed Vitals:   09/10/13 0316 09/10/13 0500 09/10/13 0632 09/10/13 0748  BP:   131/84   Pulse: 80  73   Temp:   96.2 F (35.7 C)   TempSrc:   Oral   Resp: 16  18   Height:      Weight:  91.6 kg (201 lb 15.1 oz) 91.5 kg (201 lb 11.5 oz)   SpO2: 88%  90% 89%    Intake/Output Summary (Last 24 hours) at 09/10/13 0909 Last data filed at 09/09/13 1755  Gross per 24 hour  Intake    253 ml  Output    550 ml  Net   -297 ml     Filed Weights   09/09/13 0600 09/10/13 0500 09/10/13 0632  Weight: 86.637 kg (191 lb) 91.6 kg (201 lb 15.1 oz) 91.5 kg (201 lb 11.5 oz)    Exam:   Afebrile, vital signs stable. Hypoxia appears to be stable.  General: Appears calm and comfortable. Overall appears better today.  Psychiatric: Grossly normal mood and affect. Speech fluent and appropriate.  Cardiovascular: Regular rate and rhythm. Heart sounds distant. No murmur, rub, gallop. No lower extremity edema.  Respiratory: Improved air movement. Few wheezes. No rhonchi or rales. Normal respiratory effort.  Neck: Neck is full, I do not appreciate any JVD.  Data Reviewed:  Capillary blood sugars stable  Basic metabolic panel unremarkable.   Scheduled Meds: . albuterol  2.5 mg Nebulization Q6H  . antiseptic oral rinse  15 mL Mouth Rinse BID  . aspirin EC  81 mg Oral Daily  . atorvastatin  20 mg Oral q1800  . budesonide-formoterol  2 puff Inhalation  BID  . citalopram  10 mg Oral Daily  . diltiazem  60 mg Oral Q6H  . furosemide  20 mg Oral Daily  . gabapentin  100 mg Oral TID  . insulin aspart  0-5 Units Subcutaneous QHS  .  insulin aspart  0-9 Units Subcutaneous TID WC  . levofloxacin  500 mg Oral Daily  . levothyroxine  112 mcg Oral QAC breakfast  . methylPREDNISolone (SOLU-MEDROL) injection  40 mg Intravenous Q6H  . nicotine  21 mg Transdermal Daily  . pantoprazole  40 mg Oral q morning - 10a  . patient's guide to using coumadin book   Does not apply Once  . ramipril  5 mg Oral Daily  . sodium chloride  3 mL Intravenous Q12H   Continuous Infusions: . diltiazem (CARDIZEM) infusion Stopped (09/09/13 1100)    Principal Problem:   Atrial fibrillation with rapid ventricular response Active Problems:   HYPOTHYROIDISM   DM   HYPERTENSION   COPD   Atrial fibrillation   Chronic respiratory failure   Cigarette smoker   Acute diastolic congestive heart failure   Pericardial effusion   Time spent 15 minutes

## 2013-09-10 NOTE — Progress Notes (Signed)
Removed foley, pt has already voided on her own. Will continue to monitor.

## 2013-09-10 NOTE — Progress Notes (Signed)
RT alerted pt O2 sat 87-90 on 4lpm.. Reported earlier today sats dropped to 70s while ambulating.  Changed telemetry box to include pulse ox and called to update central telemetry to call if sats drop below 83%. Will continue to monitor.

## 2013-09-11 DIAGNOSIS — I059 Rheumatic mitral valve disease, unspecified: Secondary | ICD-10-CM

## 2013-09-11 DIAGNOSIS — I319 Disease of pericardium, unspecified: Secondary | ICD-10-CM

## 2013-09-11 DIAGNOSIS — I5031 Acute diastolic (congestive) heart failure: Secondary | ICD-10-CM

## 2013-09-11 DIAGNOSIS — I509 Heart failure, unspecified: Secondary | ICD-10-CM

## 2013-09-11 LAB — GLUCOSE, CAPILLARY
Glucose-Capillary: 167 mg/dL — ABNORMAL HIGH (ref 70–99)
Glucose-Capillary: 167 mg/dL — ABNORMAL HIGH (ref 70–99)

## 2013-09-11 MED ORDER — PREDNISONE 20 MG PO TABS
60.0000 mg | ORAL_TABLET | Freq: Every day | ORAL | Status: DC
  Administered 2013-09-12: 60 mg via ORAL
  Filled 2013-09-11: qty 3

## 2013-09-11 MED ORDER — APIXABAN 5 MG PO TABS
5.0000 mg | ORAL_TABLET | Freq: Two times a day (BID) | ORAL | Status: DC
  Administered 2013-09-11 – 2013-09-12 (×2): 5 mg via ORAL
  Filled 2013-09-11 (×4): qty 1

## 2013-09-11 NOTE — Progress Notes (Signed)
ANTICOAGULATION CONSULT NOTE - Initial Consult  Pharmacy Consult for Apixaban Indication: atrial fibrillation, non-valvular  Allergies  Allergen Reactions  . Bactrim [Sulfamethoxazole-Tmp Ds] Hives  . Erythromycin Hives  . Keflex [Cephalexin] Hives  . Penicillins Itching and Swelling    Sweating    Patient Measurements: Height: 5\' 4"  (162.6 cm) Weight: 201 lb 11.5 oz (91.5 kg) IBW/kg (Calculated) : 54.7 Heparin Dosing Weight:   Vital Signs: Temp: 97.6 F (36.4 C) (10/20 1459) Temp src: Oral (10/20 0500) BP: 139/65 mmHg (10/20 1459) Pulse Rate: 74 (10/20 1459)  Labs:  Recent Labs  09/08/13 2046 09/09/13 0513 09/10/13 0442  HGB  --  14.0  --   HCT  --  42.6  --   PLT  --  381  --   LABPROT  --  16.8*  --   INR  --  1.40  --   HEPARINUNFRC <0.10* 0.27*  --   CREATININE  --  0.75 0.66  TROPONINI <0.30 <0.30  --     Estimated Creatinine Clearance: 85 ml/min (by C-G formula based on Cr of 0.66).   Medical History: Past Medical History  Diagnosis Date  . Asthma   . COPD (chronic obstructive pulmonary disease)   . Hypothyroidism   . Essential hypertension, benign   . Hyperlipidemia   . Type 2 diabetes mellitus   . Atrial fibrillation   . MI (myocardial infarction)     2012  . Pericardial effusion 09/09/2013    Medications:  Scheduled:  . albuterol  2.5 mg Nebulization Q6H  . antiseptic oral rinse  15 mL Mouth Rinse BID  . apixaban  5 mg Oral BID  . atorvastatin  20 mg Oral q1800  . budesonide-formoterol  2 puff Inhalation BID  . citalopram  10 mg Oral Daily  . diltiazem  240 mg Oral Daily  . docusate sodium  100 mg Oral BID  . furosemide  20 mg Oral Daily  . gabapentin  100 mg Oral TID  . insulin aspart  0-5 Units Subcutaneous QHS  . insulin aspart  0-9 Units Subcutaneous TID WC  . levofloxacin  500 mg Oral Daily  . levothyroxine  112 mcg Oral QAC breakfast  . methylPREDNISolone (SOLU-MEDROL) injection  40 mg Intravenous Q12H  . nicotine  21 mg  Transdermal Daily  . pantoprazole  40 mg Oral q morning - 10a  . polyethylene glycol  17 g Oral Daily  . ramipril  5 mg Oral Daily  . sodium chloride  3 mL Intravenous Q12H    Assessment: New onset non-valvular atrial fibrillation Patient age 57 Patient weight 91.5 kg SCr 0.7  Goal of Therapy:  thromboembolism/ stroke prophylaxis Monitor platelets by anticoagulation protocol: Yes   Plan:  Apixaban 5 mg po twice daily Monitor CBC, platelets  Laroy Mustard Bennett 09/11/2013,4:26 PM

## 2013-09-11 NOTE — Progress Notes (Signed)
SUBJECTIVE: Pt denies chest pain and says breathing has improved. She is eager to go home.     Intake/Output Summary (Last 24 hours) at 09/11/13 1611 Last data filed at 09/11/13 1227  Gross per 24 hour  Intake    440 ml  Output   1550 ml  Net  -1110 ml    Current Facility-Administered Medications  Medication Dose Route Frequency Provider Last Rate Last Dose  . 0.9 %  sodium chloride infusion  250 mL Intravenous PRN Standley Brooking, MD 10 mL/hr at 09/09/13 1708    . acetaminophen (TYLENOL) tablet 650 mg  650 mg Oral Q4H PRN Standley Brooking, MD      . albuterol (PROVENTIL) (5 MG/ML) 0.5% nebulizer solution 2.5 mg  2.5 mg Nebulization Q2H PRN Standley Brooking, MD   2.5 mg at 09/09/13 0854  . albuterol (PROVENTIL) (5 MG/ML) 0.5% nebulizer solution 2.5 mg  2.5 mg Nebulization Q6H Standley Brooking, MD   2.5 mg at 09/11/13 1435  . ALPRAZolam Prudy Feeler) tablet 1 mg  1 mg Oral TID PRN Standley Brooking, MD   1 mg at 09/11/13 0840  . antiseptic oral rinse (BIOTENE) solution 15 mL  15 mL Mouth Rinse BID Standley Brooking, MD   15 mL at 09/11/13 0800  . aspirin EC tablet 81 mg  81 mg Oral Daily Standley Brooking, MD   81 mg at 09/11/13 1050  . atorvastatin (LIPITOR) tablet 20 mg  20 mg Oral q1800 Standley Brooking, MD   20 mg at 09/10/13 1647  . budesonide-formoterol (SYMBICORT) 80-4.5 MCG/ACT inhaler 2 puff  2 puff Inhalation BID Jinger Neighbors, NP   2 puff at 09/11/13 0724  . citalopram (CELEXA) tablet 10 mg  10 mg Oral Daily Standley Brooking, MD   10 mg at 09/11/13 1050  . diltiazem (CARDIZEM CD) 24 hr capsule 240 mg  240 mg Oral Daily Standley Brooking, MD   240 mg at 09/11/13 1050  . docusate sodium (COLACE) capsule 100 mg  100 mg Oral BID Standley Brooking, MD   100 mg at 09/11/13 1051  . furosemide (LASIX) tablet 20 mg  20 mg Oral Daily Standley Brooking, MD   20 mg at 09/11/13 1051  . gabapentin (NEURONTIN) capsule 100 mg  100 mg Oral TID Standley Brooking, MD   100 mg at  09/11/13 1524  . HYDROcodone-acetaminophen (NORCO/VICODIN) 5-325 MG per tablet 1-2 tablet  1-2 tablet Oral Q4H PRN Standley Brooking, MD   1 tablet at 09/11/13 [phone]  . insulin aspart (novoLOG) injection 0-5 Units  0-5 Units Subcutaneous QHS Standley Brooking, MD   2 Units at 09/10/13 2238  . insulin aspart (novoLOG) injection 0-9 Units  0-9 Units Subcutaneous TID WC Standley Brooking, MD   2 Units at 09/11/13 1221  . levofloxacin (LEVAQUIN) tablet 500 mg  500 mg Oral Daily Standley Brooking, MD   500 mg at 09/11/13 1051  . levothyroxine (SYNTHROID, LEVOTHROID) tablet 112 mcg  112 mcg Oral QAC breakfast Standley Brooking, MD   112 mcg at 09/11/13 [phone]  . methylPREDNISolone sodium succinate (SOLU-MEDROL) 40 mg/mL injection 40 mg  40 mg Intravenous Q12H Standley Brooking, MD   40 mg at 09/11/13 0646  . nicotine (NICODERM CQ - dosed in mg/24 hours) patch 21 mg  21 mg Transdermal Daily Standley Brooking, MD   21 mg at 09/11/13 1051  .  ondansetron (ZOFRAN) injection 4 mg  4 mg Intravenous Q6H PRN Standley Brooking, MD      . pantoprazole (PROTONIX) EC tablet 40 mg  40 mg Oral q morning - 10a Standley Brooking, MD   40 mg at 09/11/13 1050  . polyethylene glycol (MIRALAX / GLYCOLAX) packet 17 g  17 g Oral Daily Standley Brooking, MD   17 g at 09/11/13 1051  . ramipril (ALTACE) capsule 5 mg  5 mg Oral Daily Standley Brooking, MD   5 mg at 09/11/13 1050  . sodium chloride 0.9 % injection 3 mL  3 mL Intravenous Q12H Standley Brooking, MD   3 mL at 09/11/13 1051  . sodium chloride 0.9 % injection 3 mL  3 mL Intravenous PRN Standley Brooking, MD        Filed Vitals:   09/11/13 0309 09/11/13 0500 09/11/13 0724 09/11/13 1435  BP:  118/49    Pulse:  60    Temp:  97.6 F (36.4 C)    TempSrc:  Oral    Resp:  16    Height:      Weight:      SpO2: 92% 94% 93% 94%    PHYSICAL EXAM General: NAD Neck: No JVD, no thyromegaly or thyroid nodule.  Lungs: Faint expiratory wheezes bilaterally with dimished  respiratory effort. CV: Nondisplaced PMI.  Heart regular S1/S2, no S3/S4, no murmur.  No peripheral edema.  No carotid bruit.  Normal pedal pulses.  Abdomen: Soft, nontender, no hepatosplenomegaly, no distention.  Neurologic: Alert and oriented x 3.  Psych: Normal affect. Extremities: No clubbing or cyanosis.   TELEMETRY: Reviewed telemetry pt in normal sinus rhythm.  LABS: Basic Metabolic Panel:  Recent Labs  16/10/96 0513 09/10/13 0442  NA 132* 135  K 3.8 4.1  CL 92* 95*  CO2 30 31  GLUCOSE 189* 182*  BUN 24* 30*  CREATININE 0.75 0.66  CALCIUM 9.6 10.2   Liver Function Tests: No results found for this basename: AST, ALT, ALKPHOS, BILITOT, PROT, ALBUMIN,  in the last 72 hours No results found for this basename: LIPASE, AMYLASE,  in the last 72 hours CBC:  Recent Labs  09/09/13 0513  WBC 16.1*  HGB 14.0  HCT 42.6  MCV 76.5*  PLT 381   Cardiac Enzymes:  Recent Labs  09/08/13 2046 09/09/13 0513  TROPONINI <0.30 <0.30   BNP: No components found with this basename: POCBNP,  D-Dimer: No results found for this basename: DDIMER,  in the last 72 hours Hemoglobin A1C: No results found for this basename: HGBA1C,  in the last 72 hours Fasting Lipid Panel: No results found for this basename: CHOL, HDL, LDLCALC, TRIG, CHOLHDL, LDLDIRECT,  in the last 72 hours Thyroid Function Tests: No results found for this basename: TSH, T4TOTAL, FREET3, T3FREE, THYROIDAB,  in the last 72 hours Anemia Panel: No results found for this basename: VITAMINB12, FOLATE, FERRITIN, TIBC, IRON, RETICCTPCT,  in the last 72 hours  RADIOLOGY: Ct Angio Chest Pe W/cm &/or Wo Cm  08/27/2013   CLINICAL DATA:  Short of breath with elevated D-dimer.  EXAM: CT ANGIOGRAPHY CHEST WITH CONTRAST  TECHNIQUE: Multidetector CT imaging of the chest was performed using the standard protocol during bolus administration of intravenous contrast. Multiplanar CT image reconstructions including MIPs were obtained to  evaluate the vascular anatomy.  CONTRAST:  OMNIPAQUE IOHEXOL 350 MG/ML SOLN  COMPARISON:  CT CHEST 12/09/2010  FINDINGS: There are no filling defects within the  pulmonary arteries to suggest acute pulmonary embolism. No acute findings of the aorta or great vessels. Small to moderate pericardial effusion measuring 16 mm in depth. There are minimally enlarged paratracheal lymph nodes measuring up to 12 mm short axis. No axillary or supraclavicular or lymphadenopathy. No hilar lymphadenopathy.  Review of the lung parenchyma demonstrates subtle diffuse and ground-glass opacities in the lungs particularly the upper lobes. There are small bilateral pleural effusions. Airways are normal.  Limited view of the upper abdomen is unremarkable. Limited view of the skeleton demonstrates degenerative spurring.  Review of the MIP images confirms the above findings.  IMPRESSION: 1. No evidence of acute pulmonary embolism. 2. Small to moderate pericardial effusion. 3. Subtle diffuse ground-glass opacities and bilateral pleural effusions suggest mild pulmonary edema. 4. Mild mediastinal lymphadenopathy is likely reactive.   Electronically Signed   By: Genevive Bi M.D.   On: 08/27/2013 11:27   Dg Chest Portable 1 View  09/08/2013   CLINICAL DATA:  Shortness of breath. Asthma.  EXAM: PORTABLE CHEST - 1 VIEW  COMPARISON:  Chest radiograph 08/27/2013  FINDINGS: Stable cardiomegaly with calcification of the transverse thoracic aorta. Pulmonary vascular redistribution with bilateral interstitial pulmonary opacities most compatible with mild pulmonary edema. No definite large pleural effusion or pneumothorax.  IMPRESSION: Cardiomegaly, pulmonary venous hypertension and mild interstitial pulmonary edema.   Electronically Signed   By: Annia Belt M.D.   On: 09/08/2013 10:32   Dg Chest Port 1 View  08/27/2013   CLINICAL DATA:  Chest pain and shortness of breath.  EXAM: PORTABLE CHEST - 1 VIEW  COMPARISON:  11/11/2011   FINDINGS: The heart is mildly enlarged. Pulmonary venous hypertensive changes present without overt pulmonary edema. No pleural fluid is identified. There likely is a component of chronic lung disease. No focal airspace consolidation is seen.  IMPRESSION: Cardiomegaly, pulmonary venous hypertension and chronic lung disease.   Electronically Signed   By: Irish Lack M.D.   On: 08/27/2013 10:23      ASSESSMENT AND PLAN: 1. Paroxysmal vs new-onset atrial fibrillation: remains in sinus rhythm. Pericardial effusion is now trivial, with no hemodynamic sequelae. Recommend initiating anticoagulation with apixaban given normal renal function. Can d/c ASA. Continue long-acting Cardizem. 2. HTN: controlled on current therapy, which includes ramipril. 3. COPD: treatment deferred to hospitalist service.   Prentice Docker, M.D., F.A.C.C.

## 2013-09-11 NOTE — Progress Notes (Signed)
[facility] PROGRESS NOTE  Joanna Perez BMW:[id number] DOB: 05/09/1956 DOA: 09/08/2013 PCP: Cassell Smiles., MD  Assessment/Plan: #1 atrial fibrillation with RVR Currently rate controlled and in normal sinus rhythm. Continue diltiazem for rate control. Repeat 2-D echo with trivial pericardial effusion with no hemodynamic sequelae. Will start anticoagulation with apixaban per cardiology recommendations. Follow.  #2 diastolic CHF Compensated. Continue Lasix and diltiazem.  #3 acute COPD exacerbation superimposed on chronic respiratory failure Clinical improvement. Continue oxygen, oral Levaquin. Will transitioned from IV steroids to oral steroid taper. Follow.  #4 hypothyroidism TSH stable. Continue Synthroid.  #5 tobacco abuse Tobacco cessation.  #6 diabetes mellitus Sliding scale insulin.  Code Status: Full Family Communication: Updated patient and family at bedside. Disposition Plan: Home hopefully tomorrow   Consultants:  Cardiology  Procedures:  2-D echo 09/08/2013, 09/11/2013  Chest x-ray 09/08/2013  Antibiotics:  Levaquin 09/08/2013  HPI/Subjective: Patient states shortness of breath has improved. Patient denies any wheezing. Patient denies any chest pain.  Objective: Filed Vitals:   09/11/13 1459  BP: 139/65  Pulse: 74  Temp: 97.6 F (36.4 C)  Resp: 16    Intake/Output Summary (Last 24 hours) at 09/11/13 1623 Last data filed at 09/11/13 1227  Gross per 24 hour  Intake    440 ml  Output   1550 ml  Net  -1110 ml   Filed Weights   09/09/13 0600 09/10/13 0500 09/10/13 2725  Weight: 86.637 kg (191 lb) 91.6 kg (201 lb 15.1 oz) 91.5 kg (201 lb 11.5 oz)    Exam:   General:  NAD  Cardiovascular: RRR  Respiratory: Minimal expiratory wheezing. No crackles. Fair air movement.  Abdomen: Soft, nontender, nondistended, positive bowel sounds.  Musculoskeletal: No clubbing cyanosis or edema   Data Reviewed: Basic Metabolic  Panel:  Recent Labs Lab 09/08/13 1011 09/09/13 0513 09/10/13 0442  NA 133* 132* 135  K 3.9 3.8 4.1  CL 93* 92* 95*  CO2 29 30 31   GLUCOSE 102* 189* 182*  BUN 20 24* 30*  CREATININE 0.76 0.75 0.66  CALCIUM 10.0 9.6 10.2   Liver Function Tests:  Recent Labs Lab 09/08/13 1011  AST 22  ALT 17  ALKPHOS 80  BILITOT 0.3  PROT 7.5  ALBUMIN 3.0*   No results found for this basename: LIPASE, AMYLASE,  in the last 168 hours No results found for this basename: AMMONIA,  in the last 168 hours CBC:  Recent Labs Lab 09/08/13 1011 09/09/13 0513  WBC 12.4* 16.1*  NEUTROABS 10.0*  --   HGB 14.2 14.0  HCT 43.0 42.6  MCV 77.5* 76.5*  PLT 361 381   Cardiac Enzymes:  Recent Labs Lab 09/08/13 1011 09/08/13 1526 09/08/13 2046 09/09/13 0513  TROPONINI <0.30 <0.30 <0.30 <0.30   BNP (last 3 results)  Recent Labs  08/27/13 1022 09/08/13 1011  PROBNP 959.3* 1725.0*   CBG:  Recent Labs Lab 09/10/13 1146 09/10/13 1639 09/10/13 2027 09/11/13 0734 09/11/13 1146  GLUCAP 132* 132* 236* 167* 167*    Recent Results (from the past 240 hour(s))  URINE CULTURE     Status: None   Collection Time    09/08/13 12:05 PM      Result Value Range Status   Specimen Description URINE, CATHETERIZED   Final   Special Requests NONE   Final   Culture  Setup Time     Final   Value: 09/08/2013 13:50     Performed at [facility] Count  Final   Value: NO GROWTH     Performed at [facility]   Culture     Final   Value: NO GROWTH     Performed at [facility]   Report Status 09/09/2013 FINAL   Final  MRSA PCR SCREENING     Status: None   Collection Time    09/08/13  1:08 PM      Result Value Range Status   MRSA by PCR NEGATIVE  NEGATIVE Final   Comment:            The GeneXpert MRSA Assay (FDA     approved for NASAL specimens     only), is one component of a     comprehensive MRSA colonization     surveillance program. It is not      intended to diagnose MRSA     infection nor to guide or     monitor treatment for     MRSA infections.     Studies: No results found.  Scheduled Meds: . albuterol  2.5 mg Nebulization Q6H  . antiseptic oral rinse  15 mL Mouth Rinse BID  . apixaban  5 mg Oral BID  . atorvastatin  20 mg Oral q1800  . budesonide-formoterol  2 puff Inhalation BID  . citalopram  10 mg Oral Daily  . diltiazem  240 mg Oral Daily  . docusate sodium  100 mg Oral BID  . furosemide  20 mg Oral Daily  . gabapentin  100 mg Oral TID  . insulin aspart  0-5 Units Subcutaneous QHS  . insulin aspart  0-9 Units Subcutaneous TID WC  . levofloxacin  500 mg Oral Daily  . levothyroxine  112 mcg Oral QAC breakfast  . methylPREDNISolone (SOLU-MEDROL) injection  40 mg Intravenous Q12H  . nicotine  21 mg Transdermal Daily  . pantoprazole  40 mg Oral q morning - 10a  . polyethylene glycol  17 g Oral Daily  . ramipril  5 mg Oral Daily  . sodium chloride  3 mL Intravenous Q12H   Continuous Infusions:   Principal Problem:   Atrial fibrillation with rapid ventricular response Active Problems:   HYPOTHYROIDISM   DM   HYPERTENSION   COPD   Atrial fibrillation   Chronic respiratory failure   Cigarette smoker   Acute diastolic congestive heart failure   Pericardial effusion    Time spent: > 30 mins    [facility]  [facility] Pager [phone]. If 7PM-7AM, please contact night-coverage at [email or web address], password [facility] 09/11/2013, 4:23 PM  LOS: 3 days

## 2013-09-11 NOTE — Care Management Note (Signed)
    Page 1 of 1   09/12/2013     4:24:25 PM   CARE MANAGEMENT NOTE 09/12/2013  Patient:  Joanna Perez, Joanna Perez   Account Number:  [id number]  Date Initiated:  09/11/2013  Documentation initiated by:  Rosemary Holms  Subjective/Objective Assessment:   Pt admitted from home where she lives alone. Husband seperated. Son and D'in law assist her with errand. Has chronic O2 at home, walker, cane, BSC. Pt would benefit from [facility] RN and she is agreeable. Chooses [facility]     Action/Plan:   Anticipated DC Date:  09/13/2013   Anticipated DC Plan:  HOME W HOME HEALTH SERVICES      DC Planning Services  CM consult      Choice offered to / List presented to:          [facility] arranged  HH-1 RN  HH-10 DISEASE MANAGEMENT      HH agency  [facility]   Status of service:  Completed, signed off Medicare Important Message given?   (If response is "NO", the following Medicare IM given date fields will be blank) Date Medicare IM given:   Date Additional Medicare IM given:    Discharge Disposition:  HOME W HOME HEALTH SERVICES  Per UR Regulation:    If discussed at Long Length of Stay Meetings, dates discussed:    Comments:  09/11/13 Rosemary Holms RN BSN CM [facility] has RN who will touch base with pt on Monday. Would also benefit from [facility]. [facility] notified of pending referral.

## 2013-09-11 NOTE — Progress Notes (Signed)
Pt SAT on 4lpm/[place] is 92 while sleeping, Concern still is present if Pt is discharged at 4lpm/.

## 2013-09-11 NOTE — Progress Notes (Signed)
*  PRELIMINARY RESULTS* Echocardiogram 2D Echocardiogram limited has been performed.  Joanna Perez 09/11/2013, 12:37 PM

## 2013-09-11 NOTE — Progress Notes (Signed)
Pt ambulated in hallway with standby assistance. Pt ambulated approximately 200 feet.  Tolerated well. No c/o increased SOB or pain.  Pt O2 saturation ranged between 89-94% on 3 LPM via [place].

## 2013-09-12 LAB — BASIC METABOLIC PANEL
BUN: 27 mg/dL — ABNORMAL HIGH (ref 6–23)
CO2: 36 mEq/L — ABNORMAL HIGH (ref 19–32)
Calcium: 10.1 mg/dL (ref 8.4–10.5)
Chloride: 97 mEq/L (ref 96–112)
Creatinine, Ser: 0.58 mg/dL (ref 0.50–1.10)
GFR calc non Af Amer: >90 mL/min (ref >90)
Glucose, Bld: 190 mg/dL — ABNORMAL HIGH (ref 70–99)
Sodium: 139 mEq/L (ref 135–145)

## 2013-09-12 LAB — CBC
HCT: 41.8 % (ref 36.0–46.0)
MCH: 25 pg — ABNORMAL LOW (ref 26.0–34.0)
MCHC: 32.8 g/dL (ref 30.0–36.0)
MCV: 76.3 fL — ABNORMAL LOW (ref 78.0–100.0)
Platelets: 468 10*3/uL — ABNORMAL HIGH (ref 150–400)
RBC: 5.48 MIL/uL — ABNORMAL HIGH (ref 3.87–5.11)

## 2013-09-12 LAB — GLUCOSE, CAPILLARY: Glucose-Capillary: 262 mg/dL — ABNORMAL HIGH (ref 70–99)

## 2013-09-12 MED ORDER — DILTIAZEM HCL ER COATED BEADS 240 MG PO CP24: 240.0000 mg | ORAL_CAPSULE | Freq: Every day | ORAL | Status: DC

## 2013-09-12 MED ORDER — PREDNISONE 10 MG PO TABS: ORAL_TABLET | ORAL | Status: DC

## 2013-09-12 MED ORDER — APIXABAN 5 MG PO TABS: 5.0000 mg | ORAL_TABLET | Freq: Two times a day (BID) | ORAL | Status: DC

## 2013-09-12 MED ORDER — NICOTINE 21 MG/24HR TD PT24: 1.0000 | MEDICATED_PATCH | Freq: Every day | TRANSDERMAL | Status: DC

## 2013-09-12 NOTE — Progress Notes (Signed)
[facility] PROGRESS NOTE  Joanna Perez WUJ:[id number] DOB: 24-Mar-1956 DOA: 09/08/2013 PCP: Cassell Smiles., MD  Assessment/Plan: 1. Atrial fibrillation with rapid ventricular response: Remains in sinus rhythm. Continue diltiazem. Apixaban and discontinue aspirin per cardiology. TSH normal. 2. Pericardial effusion: Repeat echo revealed trivial pericardial effusion, no hemodynamic significance. No further evaluation suggested 3. Acute diastolic congestive heart failure: Well compensated. Continue Lasix, diltiazem. 4. COPD exacerbation superimposed on chronic respiratory failure (3 L per minute nasal cannula 24/7): Rapidly improving. 5. Mild hyponatremia: Resolved. 6. Cigarette smoker: I recommend cessation. Patient is interested at this point wants to continue to smoke 2 cigarettes per day 7. Hypothyroidism: TSH normal. Continue replacement therapy. 8. Diabetes mellitus: Diet controlled at home. Stable on sliding scale insulin.   Continue long-acting diltiazem  Patient will pick up month supply sample of apixaban at [facility] office today.  Complete antibiotics today. Steroid taper as an outpatient.  Discharge home  Pending studies:   None   Code Status: Full code DVT prophylaxis: SCDs Family Communication:  Disposition Plan: Home with home health RN  Brendia Sacks, MD  [facility]  Pager [phone] If 7PM-7AM, please contact night-coverage at [email or web address], password [facility] 09/12/2013, 10:04 AM  LOS: 4 days   Summary: 57 year old woman presented to the emergency department with increasing shortness of breath and heart palpitations. Initial evaluation in the emergency department was notable for atrial fibrillation with rapid ventricular response and suggested pulmonary edema, possible heart failure, COPD exacerbation.  Consultants:  Cardiology   Procedures:  10/17: 2-D echocardiogram: Left ventricular ejection fraction 50-55%. Grade 2 diastolic  dysfunction. Moderate circumferential pericardial effusion with some features to suggest hemodynamic significance.  10/21:    Antibiotics:  Levaquin 10/17 >>   HPI/Subjective: No issues overnight. Remains in sinus rhythm. Overall feeling somewhat better, perhaps some mild improvement in breathing.  Objective: Filed Vitals:   09/11/13 2105 09/12/13 0159 09/12/13 0549 09/12/13 0743  BP: 141/63  156/77   Pulse: 79  82   Temp: 97.8 F (36.6 C)  98.1 F (36.7 C)   TempSrc: Oral  Oral   Resp: 16  16   Height:      Weight:   90 kg (198 lb 6.6 oz)   SpO2: 92% 89% 93% 92%    Intake/Output Summary (Last 24 hours) at 09/12/13 1004 Last data filed at 09/12/13 0900  Gross per 24 hour  Intake   1020 ml  Output   2150 ml  Net  -1130 ml     Filed Weights   09/10/13 0500 09/10/13 0632 09/12/13 0549  Weight: 91.6 kg (201 lb 15.1 oz) 91.5 kg (201 lb 11.5 oz) 90 kg (198 lb 6.6 oz)    Exam:   Afebrile, vital signs stable. Hypoxia appears to be stable.  General: Appears calm and comfortable. Overall appears better today.  Psychiatric: Grossly normal mood and affect. Speech fluent and appropriate.  Cardiovascular: Regular rate and rhythm. Heart sounds distant. No murmur, rub, gallop. No lower extremity edema.  Respiratory: Improved air movement. Few wheezes. No rhonchi or rales. Normal respiratory effort.  Neck: Neck is full, I do not appreciate any JVD.  Data Reviewed:  Capillary blood sugars stable  Basic metabolic panel unremarkable.   Scheduled Meds: . albuterol  2.5 mg Nebulization Q6H  . antiseptic oral rinse  15 mL Mouth Rinse BID  . apixaban  5 mg Oral BID  . atorvastatin  20 mg Oral q1800  . budesonide-formoterol  2 puff Inhalation BID  .  citalopram  10 mg Oral Daily  . diltiazem  240 mg Oral Daily  . docusate sodium  100 mg Oral BID  . furosemide  20 mg Oral Daily  . gabapentin  100 mg Oral TID  . insulin aspart  0-5 Units Subcutaneous QHS  . insulin aspart   0-9 Units Subcutaneous TID WC  . levofloxacin  500 mg Oral Daily  . levothyroxine  112 mcg Oral QAC breakfast  . nicotine  21 mg Transdermal Daily  . pantoprazole  40 mg Oral q morning - 10a  . polyethylene glycol  17 g Oral Daily  . predniSONE  60 mg Oral QAC breakfast  . ramipril  5 mg Oral Daily  . sodium chloride  3 mL Intravenous Q12H   Continuous Infusions:    Principal Problem:   Atrial fibrillation with rapid ventricular response Active Problems:   HYPOTHYROIDISM   DM   HYPERTENSION   COPD   Atrial fibrillation   Chronic respiratory failure   Cigarette smoker   Acute diastolic congestive heart failure   Pericardial effusion   Time spent 15 minutes

## 2013-09-12 NOTE — Progress Notes (Signed)
Patient is alert and oriented X4; IV's removed.  Discharge instructions given verbally and written.  Patient verbalized understanding with no questions at this time.  Patient left floor in stable condition via wheelchair assisted by NT.  Patient was to stop by the cardiology office on the way out for medication samples.

## 2013-09-12 NOTE — Discharge Summary (Signed)
Physician Discharge Summary  Joanna Perez:[id number] DOB: 02/09/1956 DOA: 09/08/2013  PCP: Cassell Smiles., MD  Admit date: 09/08/2013 Discharge date: 09/12/2013  Recommendations for Outpatient Follow-up:  1. New diagnosis of atrial fibrillation, newly started on apixaban.  2. Resolved pericardial effusion, see discussion below 3. Acute diastolic congestive heart failure 4. Chronic respiratory failure, COPD exacerbation, continue to encourage smoking cessation.   Follow-up Information   Follow up with Cassell Smiles., MD In 1 week.   Specialty:  Internal Medicine   Contact information:   [address] [place] [address] [phone]       Follow up with Prentice Docker A, MD. Schedule an appointment as soon as possible for a visit in 2 weeks.   Specialty:  Cardiology   Contact information:   [address] [place] [address] [phone]      Discharge Diagnoses:  1. Atrial fibrillation with rapid ventricular response 2. Pericardial effusion 3. Acute diastolic congestive heart failure 4. COPD exacerbation 5. Chronic respiratory failure oxygen dependent 6. Ongoing cigarette smoking  Discharge Condition: Improved Disposition: Home with home health RN for disease management  Diet recommendation: Heart healthy diabetic diet  Filed Weights   09/10/13 0500 09/10/13 4401 09/12/13 0549  Weight: 91.6 kg (201 lb 15.1 oz) 91.5 kg (201 lb 11.5 oz) 90 kg (198 lb 6.6 oz)    History of present illness:  57 year old woman presented to the emergency department with increasing shortness of breath and heart palpitations. Initial evaluation in the emergency department was notable for atrial fibrillation with rapid ventricular response and suggested pulmonary edema, possible heart failure, COPD exacerbation.  Hospital Course:  Ms. Joanna Perez was admitted for further evaluation of atrial fibrillation with rapid ventricular response. She  converted to sinus rhythm on diltiazem infusion and has maintained sinus rhythm on oral diltiazem. Echocardiogram revealed moderate pericardial effusion with suggestion of hemodynamic significance. She was observed and echo was repeated which revealed resolution of the pericardial effusion. She was treated for COPD exacerbation complicated by high ongoing cigarette use and acute diastolic heart failure. She has chronic respiratory failure, is at baseline oxygen requirement, feels well and is stable for discharge.  Plan:  1. Atrial fibrillation with rapid ventricular response: Remains in sinus rhythm. Continue diltiazem. Apixaban and discontinue aspirin per cardiology. TSH normal. 2. Pericardial effusion: Repeat echo revealed trivial pericardial effusion, no hemodynamic significance. No further evaluation suggested 3. Acute diastolic congestive heart failure: Well compensated. Continue Lasix, diltiazem. 4. COPD exacerbation superimposed on chronic respiratory failure (3 L per minute nasal cannula 24/7): Rapidly improving. 5. Mild hyponatremia: Resolved. 6. Cigarette smoker: I recommend cessation. Patient is interested at this point wants to continue to smoke 2 cigarettes per day 7. Hypothyroidism: TSH normal. Continue replacement therapy. 8. Diabetes mellitus: Diet controlled at home. Stable on sliding scale insulin.   Consultants:  Cardiology  Procedures:  10/17: 2-D echocardiogram: Left ventricular ejection fraction 50-55%. Grade 2 diastolic dysfunction. Moderate circumferential pericardial effusion with some features to suggest hemodynamic significance.  10/21:  Antibiotics:  Levaquin 10/17 >> 10/21  Discharge Instructions  Discharge Orders   Future Orders Complete By Expires   Diet - low sodium heart healthy  As directed    Diet Carb Modified  As directed    Discharge instructions  As directed    Comments:     Call your physician or seek immediate medical attention for rapid heart  rate, chest pain, shortness of breath or worsening of condition. Stopped by  the cardiology office on the first floor at [facility] to pick up sample supply of apixaban. Please stop smoking--smoking will worsen her lung function and can lead to cancer.   Increase activity slowly  As directed        Medication List    STOP taking these medications       ciprofloxacin 500 MG tablet  Commonly known as:  CIPRO      TAKE these medications       albuterol 2 MG tablet  Commonly known as:  PROVENTIL  Take 2 mg by mouth 3 (three) times daily.     albuterol 108 (90 BASE) MCG/ACT inhaler  Commonly known as:  PROVENTIL HFA;VENTOLIN HFA  Inhale 2 puffs into the lungs every 4 (four) hours as needed. Shortness of breath     albuterol-ipratropium 18-103 MCG/ACT inhaler  Commonly known as:  COMBIVENT  Inhale 2 puffs into the lungs every 6 (six) hours as needed for wheezing or shortness of breath.     ALPRAZolam 1 MG tablet  Commonly known as:  XANAX  Take 1 mg by mouth 3 (three) times daily.     apixaban 5 MG Tabs tablet  Commonly known as:  ELIQUIS  Take 1 tablet (5 mg total) by mouth 2 (two) times daily.     budesonide-formoterol 80-4.5 MCG/ACT inhaler  Commonly known as:  SYMBICORT  Inhale 2 puffs into the lungs 2 (two) times daily.     citalopram 10 MG tablet  Commonly known as:  CELEXA  Take 10 mg by mouth daily.     diltiazem 240 MG 24 hr capsule  Commonly known as:  CARDIZEM CD  Take 1 capsule (240 mg total) by mouth daily.     furosemide 20 MG tablet  Commonly known as:  LASIX  Take 1 tablet (20 mg total) by mouth 2 (two) times daily.     gabapentin 100 MG capsule  Commonly known as:  NEURONTIN  Take 100 mg by mouth 3 (three) times daily.     HYDROcodone-acetaminophen 5-325 MG per tablet  Commonly known as:  NORCO/VICODIN  1 OR 2 PO Q4H PRN PAIN     levothyroxine 112 MCG tablet  Commonly known as:  SYNTHROID, LEVOTHROID  Take 112 mcg by mouth daily.     nicotine  21 mg/24hr patch  Commonly known as:  NICODERM CQ - dosed in mg/24 hours  Place 1 patch onto the skin daily.     pantoprazole 40 MG tablet  Commonly known as:  PROTONIX  Take 40 mg by mouth every morning.     predniSONE 10 MG tablet  Commonly known as:  DELTASONE  - 10/22-10/24: Take 40 mg by mouth with food daily.  - 10/25-10/27: Take 20 mg by mouth daily with food.  - 10/28-10/30: Take 10 mg by mouth daily with food then stop.     ramipril 5 MG capsule  Commonly known as:  ALTACE  Take 5 mg by mouth daily.     simvastatin 40 MG tablet  Commonly known as:  ZOCOR  Take 40 mg by mouth at bedtime.     theophylline 200 MG 12 hr tablet  Commonly known as:  THEODUR  Take 400 mg by mouth 2 (two) times daily.     vitamin C 1000 MG tablet  Take 1,000 mg by mouth daily.       Allergies  Allergen Reactions  . Bactrim [Sulfamethoxazole-Tmp Ds] Hives  . Erythromycin Hives  . Keflex [  Cephalexin] Hives  . Penicillins Itching and Swelling    Sweating    The results of significant diagnostics from this hospitalization (including imaging, microbiology, ancillary and laboratory) are listed below for reference.    Significant Diagnostic Studies: Ct Angio Chest Pe W/cm &/or Wo Cm  08/27/2013   CLINICAL DATA:  Short of breath with elevated D-dimer.  EXAM: CT ANGIOGRAPHY CHEST WITH CONTRAST  TECHNIQUE: Multidetector CT imaging of the chest was performed using the standard protocol during bolus administration of intravenous contrast. Multiplanar CT image reconstructions including MIPs were obtained to evaluate the vascular anatomy.  CONTRAST:  OMNIPAQUE IOHEXOL 350 MG/ML SOLN  COMPARISON:  CT CHEST 12/09/2010  FINDINGS: There are no filling defects within the pulmonary arteries to suggest acute pulmonary embolism. No acute findings of the aorta or great vessels. Small to moderate pericardial effusion measuring 16 mm in depth. There are minimally enlarged paratracheal lymph nodes  measuring up to 12 mm short axis. No axillary or supraclavicular or lymphadenopathy. No hilar lymphadenopathy.  Review of the lung parenchyma demonstrates subtle diffuse and ground-glass opacities in the lungs particularly the upper lobes. There are small bilateral pleural effusions. Airways are normal.  Limited view of the upper abdomen is unremarkable. Limited view of the skeleton demonstrates degenerative spurring.  Review of the MIP images confirms the above findings.  IMPRESSION: 1. No evidence of acute pulmonary embolism. 2. Small to moderate pericardial effusion. 3. Subtle diffuse ground-glass opacities and bilateral pleural effusions suggest mild pulmonary edema. 4. Mild mediastinal lymphadenopathy is likely reactive.   Electronically Signed   By: Genevive Bi M.D.   On: 08/27/2013 11:27   Dg Chest Portable 1 View  09/08/2013   CLINICAL DATA:  Shortness of breath. Asthma.  EXAM: PORTABLE CHEST - 1 VIEW  COMPARISON:  Chest radiograph 08/27/2013  FINDINGS: Stable cardiomegaly with calcification of the transverse thoracic aorta. Pulmonary vascular redistribution with bilateral interstitial pulmonary opacities most compatible with mild pulmonary edema. No definite large pleural effusion or pneumothorax.  IMPRESSION: Cardiomegaly, pulmonary venous hypertension and mild interstitial pulmonary edema.   Electronically Signed   By: Annia Belt M.D.   On: 09/08/2013 10:32   Microbiology: Recent Results (from the past 240 hour(s))  URINE CULTURE     Status: None   Collection Time    09/08/13 12:05 PM      Result Value Range Status   Specimen Description URINE, CATHETERIZED   Final   Special Requests NONE   Final   Culture  Setup Time     Final   Value: 09/08/2013 13:50     Performed at [facility] Count     Final   Value: NO GROWTH     Performed at [facility]   Culture     Final   Value: NO GROWTH     Performed at [facility]   Report Status 09/09/2013  FINAL   Final  MRSA PCR SCREENING     Status: None   Collection Time    09/08/13  1:08 PM      Result Value Range Status   MRSA by PCR NEGATIVE  NEGATIVE Final   Comment:            The GeneXpert MRSA Assay (FDA     approved for NASAL specimens     only), is one component of a     comprehensive MRSA colonization     surveillance program. It is not  intended to diagnose MRSA     infection nor to guide or     monitor treatment for     MRSA infections.     Labs: Basic Metabolic Panel:  Recent Labs Lab 09/08/13 1011 09/09/13 0513 09/10/13 0442 09/12/13 0535  NA 133* 132* 135 139  K 3.9 3.8 4.1 4.4  CL 93* 92* 95* 97  CO2 29 30 31  36*  GLUCOSE 102* 189* 182* 190*  BUN 20 24* 30* 27*  CREATININE 0.76 0.75 0.66 0.58  CALCIUM 10.0 9.6 10.2 10.1   Liver Function Tests:  Recent Labs Lab 09/08/13 1011  AST 22  ALT 17  ALKPHOS 80  BILITOT 0.3  PROT 7.5  ALBUMIN 3.0*   CBC:  Recent Labs Lab 09/08/13 1011 09/09/13 0513 09/12/13 0535  WBC 12.4* 16.1* 9.9  NEUTROABS 10.0*  --   --   HGB 14.2 14.0 13.7  HCT 43.0 42.6 41.8  MCV 77.5* 76.5* 76.3*  PLT 361 381 468*   Cardiac Enzymes:  Recent Labs Lab 09/08/13 1011 09/08/13 1526 09/08/13 2046 09/09/13 0513  TROPONINI <0.30 <0.30 <0.30 <0.30    Recent Labs  08/27/13 1022 09/08/13 1011  PROBNP 959.3* 1725.0*   CBG:  Recent Labs Lab 09/11/13 0734 09/11/13 1146 09/11/13 1647 09/11/13 2104 09/12/13 0734  GLUCAP 167* 167* 121* 229* 132*    Principal Problem:   Atrial fibrillation with rapid ventricular response Active Problems:   HYPOTHYROIDISM   DM   HYPERTENSION   COPD   Atrial fibrillation   Chronic respiratory failure   Cigarette smoker   Acute diastolic congestive heart failure   Pericardial effusion   Time coordinating discharge: 25 minutes  Signed:  Brendia Sacks, MD [facility] 09/12/2013, 10:25 AM

## 2013-09-27 ENCOUNTER — Encounter: Payer: Medicaid Other | Admitting: Physician Assistant

## 2013-09-29 ENCOUNTER — Encounter: Payer: Self-pay | Admitting: Adult Health

## 2013-09-29 ENCOUNTER — Ambulatory Visit (INDEPENDENT_AMBULATORY_CARE_PROVIDER_SITE_OTHER): Payer: Medicaid Other | Admitting: Adult Health

## 2013-09-29 VITALS — BP 138/64 | HR 99 | Ht 64.0 in | Wt 191.0 lb

## 2013-09-29 DIAGNOSIS — I10 Essential (primary) hypertension: Secondary | ICD-10-CM

## 2013-09-29 DIAGNOSIS — F17210 Nicotine dependence, cigarettes, uncomplicated: Secondary | ICD-10-CM

## 2013-09-29 DIAGNOSIS — F17200 Nicotine dependence, unspecified, uncomplicated: Secondary | ICD-10-CM

## 2013-09-29 DIAGNOSIS — J449 Chronic obstructive pulmonary disease, unspecified: Secondary | ICD-10-CM

## 2013-09-29 DIAGNOSIS — I509 Heart failure, unspecified: Secondary | ICD-10-CM

## 2013-09-29 DIAGNOSIS — J4489 Other specified chronic obstructive pulmonary disease: Secondary | ICD-10-CM

## 2013-09-29 DIAGNOSIS — I4891 Unspecified atrial fibrillation: Secondary | ICD-10-CM

## 2013-09-29 LAB — BASIC METABOLIC PANEL
BUN: 16 mg/dL (ref 6–23)
CO2: 29 mEq/L (ref 19–32)
Chloride: 103 mEq/L (ref 96–112)
Creat: 0.64 mg/dL (ref 0.50–1.10)
Glucose, Bld: 118 mg/dL — ABNORMAL HIGH (ref 70–99)

## 2013-09-29 MED ORDER — DILTIAZEM HCL ER COATED BEADS 240 MG PO CP24: 240.0000 mg | ORAL_CAPSULE | Freq: Every day | ORAL | Status: DC

## 2013-09-29 MED ORDER — APIXABAN 5 MG PO TABS: 5.0000 mg | ORAL_TABLET | Freq: Two times a day (BID) | ORAL | Status: DC

## 2013-09-29 NOTE — Assessment & Plan Note (Signed)
A rate is essentially controlled. Review of records at home has her heart rate averaging in the 70s to 80s. She denies any rapid heart rhythms or skipping. She is tolerating the Eliquis without overt bleeding, but does have a good amount of bruising on her skin. I am going to order a CBC to evaluate for anemia. She is also given samples of Eliquis, with a coupon for 30 days supply with prescription, and also financial assistance. We will see her back in one month to continue to follow her closely post hospitalization.

## 2013-09-29 NOTE — Assessment & Plan Note (Signed)
No clinical evidence of CHF. She does have some wheezing and rales in her chest associated with COPD, her weight is essentially the same up maybe 3 pounds. Without evidence of edema. She will continue on current medication regimen.

## 2013-09-29 NOTE — Progress Notes (Signed)
HPI: [name] is a 57 year old patient we are following for ongoing assessment and management post hospitalization for new onset atrial fibrillation, acute on chronic diastolic CHF, with history of chronic respiratory failure, COPD, and ongoing tobacco abuse. During hospitalization October 2014, the patient had a small pericardial effusion. The patient comes today with multiple somatic complaints of shortness of breath, coughing, nausea vomiting and diarrhea over the last 3 days, and fatigue. She is being followed by home health nurse who takes her blood pressures, and she brings with her report of them.    The patient's blood pressures are ranging from 112-146 with an average in the 1 30 range systolically. Heart rate is been ranging in the 70s to 80s. The patient is medically compliant and is complaining of bruising on her arms from use of apixaban. She has been having some complaints of some cramping in her hands. She wears her oxygen at nighttime if her O2 sat drops flow 90%.    He continues to be followed as an outpatient by pulmonologist, home health nurse, and primary care physician. She denies chest pain, rapid heart rhythm, bleeding, or worsening shortness of breath.  Allergies  Allergen Reactions  . Bactrim [Sulfamethoxazole-Tmp Ds] Hives  . Ciprofloxacin Hcl   . Erythromycin Hives  . Keflex [Cephalexin] Hives  . Penicillins Itching and Swelling    Sweating    Current Outpatient Prescriptions  Medication Sig Dispense Refill  . albuterol (PROVENTIL HFA;VENTOLIN HFA) 108 (90 BASE) MCG/ACT inhaler Inhale 2 puffs into the lungs every 4 (four) hours as needed. Shortness of breath       . albuterol (PROVENTIL) 2 MG tablet Take 2 mg by mouth 3 (three) times daily.        Marland Kitchen albuterol-ipratropium (COMBIVENT) 18-103 MCG/ACT inhaler Inhale 2 puffs into the lungs every 6 (six) hours as needed for wheezing or shortness of breath.      . ALPRAZolam (XANAX) 1 MG tablet Take 1 mg by mouth 3  (three) times daily.        Marland Kitchen apixaban (ELIQUIS) 5 MG TABS tablet Take 1 tablet (5 mg total) by mouth 2 (two) times daily.  60 tablet  6  . Ascorbic Acid (VITAMIN C) 1000 MG tablet Take 1,000 mg by mouth daily.      . budesonide-formoterol (SYMBICORT) 80-4.5 MCG/ACT inhaler Inhale 2 puffs into the lungs 2 (two) times daily.        . citalopram (CELEXA) 10 MG tablet Take 10 mg by mouth daily.        Marland Kitchen diltiazem (CARDIZEM CD) 240 MG 24 hr capsule Take 1 capsule (240 mg total) by mouth daily.  30 capsule  0  . furosemide (LASIX) 20 MG tablet Take 1 tablet (20 mg total) by mouth 2 (two) times daily.  14 tablet  0  . gabapentin (NEURONTIN) 100 MG capsule Take 100 mg by mouth 3 (three) times daily.      Marland Kitchen HYDROcodone-acetaminophen (NORCO/VICODIN) 5-325 MG per tablet 1 OR 2 PO Q4H PRN PAIN  20 tablet  0  . levothyroxine (SYNTHROID, LEVOTHROID) 112 MCG tablet Take 112 mcg by mouth daily.        . pantoprazole (PROTONIX) 40 MG tablet Take 40 mg by mouth every morning.      . predniSONE (DELTASONE) 10 MG tablet 10/22-10/24: Take 40 mg by mouth with food daily. 10/25-10/27: Take 20 mg by mouth daily with food. 10/28-10/30: Take 10 mg by mouth daily with food then stop.  21 tablet  0  . ramipril (ALTACE) 5 MG capsule Take 5 mg by mouth daily.        . simvastatin (ZOCOR) 40 MG tablet Take 40 mg by mouth at bedtime.        . theophylline (THEODUR) 200 MG 12 hr tablet Take 400 mg by mouth 2 (two) times daily.       No current facility-administered medications for this visit.    Past Medical History  Diagnosis Date  . Asthma   . COPD (chronic obstructive pulmonary disease)   . Hypothyroidism   . Essential hypertension, benign   . Hyperlipidemia   . Type 2 diabetes mellitus   . Atrial fibrillation   . MI (myocardial infarction)     2012  . Pericardial effusion 09/09/2013    Past Surgical History  Procedure Laterality Date  . Hernia repair    . Abdominal hysterectomy    . Stomach surgery       Wound vac currently in place  . Esophagogastroduodenoscopy  11/11/2011    Procedure: ESOPHAGOGASTRODUODENOSCOPY (EGD);  Surgeon: Malissa Hippo, MD;  Location: [facility];  Service: Endoscopy;  Laterality: N/A;  . Foreign body removal  11/11/2011    Procedure: FOREIGN BODY REMOVAL;  Surgeon: Malissa Hippo, MD;  Location: [facility];  Service: Endoscopy;  Laterality: N/A;  . Tracheal surgery    . Abdominal surgery      ZOX:WRUEAV of systems complete and found to be negative unless listed above  PHYSICAL EXAM BP 138/64  Pulse 99  Ht 5\' 4"  (1.626 m)  Wt 191 lb (86.637 kg)  BMI 32.77 kg/m2  SpO2 94%  General: Well developed, well nourished, in no acute distress, pale. Head: Eyes PERRLA, No xanthomas.   Normal cephalic and atramatic  Lungs: Inspiratory and expiratory wheezes, occasional coughing, with Rales noted.Marland Kitchen Heart: HRIR S1 S2, without MRG.  Pulses are 2+ & equal.            No carotid bruit. No JVD.  No abdominal bruits. No femoral bruits. Abdomen: Bowel sounds are positive, abdomen soft and non-tender without masses or                  Hernia's noted. Msk:  Back normal, normal gait. Normal strength and tone for age. Extremities: No clubbing, cyanosis or edema.  DP +1 Neuro: Alert and oriented X 3. Psych:  Good affect, responds appropriately.  ASSESSMENT AND PLAN

## 2013-09-29 NOTE — Assessment & Plan Note (Signed)
I discussed with her smoking cessation. And staying away from those people who are smoking in her home so as not to worsen or exacerbate COPD symptoms.

## 2013-09-29 NOTE — Assessment & Plan Note (Signed)
She remains on O2 at home. She is parameters to wear if her saturations drop below 90%. I have advised her on smoking cessation as she continues to smoke despite her lung disease. She verbalizes understanding. She is to followup with pulmonology as previously scheduled.

## 2013-09-29 NOTE — Assessment & Plan Note (Signed)
Blood pressure is controlled. I have reviewed her home records written down by home health nurse, and she appears to be stable there. She did have some mild hypotension in the setting of nausea vomiting and diarrhea with the GI virus. I am concerned that she may be losing some potassium along with use of diuretics. Will check a BMET to evaluate potassium status. She may need to be placed on some low dose potassium replacement. She has been having some cramping in her hands. We will see her again in one month.

## 2013-09-29 NOTE — Patient Instructions (Signed)
Your physician recommends that you schedule a follow-up appointment in: ONE MONTH  Your physician has recommended you make the following change in your medication:   1) CONTINUE TO TAKE ELIQUIS 5MG  TWICE DAILY   Your physician recommends that you return for lab work in: TODAY (SLIPS GIVEN FOR BMET,CBC)  WE WILL CALL YOU WITH YOUR TEST RESULTS/INSTRUCTIONS/NEXT STEPS ONCE RECEIVED BY THE PROVIDER

## 2013-09-30 LAB — CBC
MCH: 25.1 pg — ABNORMAL LOW (ref 26.0–34.0)
MCHC: 33.4 g/dL (ref 30.0–36.0)
MCV: 75.1 fL — ABNORMAL LOW (ref 78.0–100.0)
Platelets: 158 10*3/uL (ref 150–400)
RBC: 5.42 MIL/uL — ABNORMAL HIGH (ref 3.87–5.11)
RDW: 16.9 % — ABNORMAL HIGH (ref 11.5–15.5)

## 2013-10-02 ENCOUNTER — Encounter: Payer: Self-pay | Admitting: *Deleted

## 2013-10-03 ENCOUNTER — Ambulatory Visit (HOSPITAL_COMMUNITY)
Admission: RE | Admit: 2013-10-03 | Discharge: 2013-10-03 | Disposition: A | Discharge status: Home / Self Care | Payer: Medicaid Other | Source: Ambulatory Visit | Attending: Internal Medicine | Admitting: Internal Medicine

## 2013-10-03 ENCOUNTER — Other Ambulatory Visit (HOSPITAL_COMMUNITY): Payer: Self-pay | Admitting: Internal Medicine

## 2013-10-03 DIAGNOSIS — R109 Unspecified abdominal pain: Secondary | ICD-10-CM | POA: Insufficient documentation

## 2013-10-03 DIAGNOSIS — E785 Hyperlipidemia, unspecified: Secondary | ICD-10-CM | POA: Insufficient documentation

## 2013-10-03 DIAGNOSIS — E119 Type 2 diabetes mellitus without complications: Secondary | ICD-10-CM | POA: Insufficient documentation

## 2013-10-27 ENCOUNTER — Ambulatory Visit (INDEPENDENT_AMBULATORY_CARE_PROVIDER_SITE_OTHER): Payer: Medicaid Other | Admitting: Cardiology

## 2013-10-27 ENCOUNTER — Encounter: Payer: Medicaid Other | Admitting: Adult Health

## 2013-10-27 ENCOUNTER — Encounter: Payer: Self-pay | Admitting: Cardiology

## 2013-10-27 VITALS — BP 114/51 | HR 96 | Ht 64.0 in | Wt 210.0 lb

## 2013-10-27 DIAGNOSIS — I5032 Chronic diastolic (congestive) heart failure: Secondary | ICD-10-CM

## 2013-10-27 DIAGNOSIS — J9610 Chronic respiratory failure, unspecified whether with hypoxia or hypercapnia: Secondary | ICD-10-CM

## 2013-10-27 DIAGNOSIS — I4891 Unspecified atrial fibrillation: Secondary | ICD-10-CM

## 2013-10-27 DIAGNOSIS — I10 Essential (primary) hypertension: Secondary | ICD-10-CM

## 2013-10-27 DIAGNOSIS — I319 Disease of pericardium, unspecified: Secondary | ICD-10-CM

## 2013-10-27 DIAGNOSIS — I313 Pericardial effusion (noninflammatory): Secondary | ICD-10-CM

## 2013-10-27 MED ORDER — FUROSEMIDE 40 MG PO TABS: 40.0000 mg | ORAL_TABLET | Freq: Two times a day (BID) | ORAL | Status: DC

## 2013-10-27 NOTE — Assessment & Plan Note (Signed)
Blood pressure is normal today. 

## 2013-10-27 NOTE — Assessment & Plan Note (Signed)
Paroxysmal by history. Plan is to continue Eliquis and Cardizem CD for now.

## 2013-10-27 NOTE — Assessment & Plan Note (Signed)
Trivial by followup echocardiogram in October. May need limited echocardiogram if no clinical improvement next visit on intensified diuretic.

## 2013-10-27 NOTE — Progress Notes (Signed)
HPI: Mrs. schools be is a 57 year old patient of Dr. Diona Browner whereupon for ongoing assessment and management of atrial fibrillation, acute on chronic diastolic CHF, with chronic respiratory failure to include COPD ongoing tobacco abuse. The patient was last in the office on 09/29/2013 status post hospitalization. The patient on last visit was given sample of Eliquis, with a coupon for 30 day supply with prescription and also financial assistance. Blood pressure was well controlled. Followup BMET was ordered. She was found to be well compensated from CHF. She was counseled on smoking cessation. BMET completed, was within normal limits.  Allergies  Allergen Reactions  . Bactrim [Sulfamethoxazole-Tmp Ds] Hives  . Ciprofloxacin Hcl   . Erythromycin Hives  . Keflex [Cephalexin] Hives  . Penicillins Itching and Swelling    Sweating    Current Outpatient Prescriptions  Medication Sig Dispense Refill  . albuterol (PROVENTIL HFA;VENTOLIN HFA) 108 (90 BASE) MCG/ACT inhaler Inhale 2 puffs into the lungs every 4 (four) hours as needed. Shortness of breath       . albuterol (PROVENTIL) 2 MG tablet Take 2 mg by mouth 3 (three) times daily.        Marland Kitchen albuterol-ipratropium (COMBIVENT) 18-103 MCG/ACT inhaler Inhale 2 puffs into the lungs every 6 (six) hours as needed for wheezing or shortness of breath.      . ALPRAZolam (XANAX) 1 MG tablet Take 1 mg by mouth 3 (three) times daily.        Marland Kitchen apixaban (ELIQUIS) 5 MG TABS tablet Take 1 tablet (5 mg total) by mouth 2 (two) times daily.  60 tablet  6  . Ascorbic Acid (VITAMIN C) 1000 MG tablet Take 1,000 mg by mouth daily.      . budesonide-formoterol (SYMBICORT) 80-4.5 MCG/ACT inhaler Inhale 2 puffs into the lungs 2 (two) times daily.        . citalopram (CELEXA) 10 MG tablet Take 10 mg by mouth daily.        Marland Kitchen diltiazem (CARDIZEM CD) 240 MG 24 hr capsule Take 1 capsule (240 mg total) by mouth daily.  30 capsule  6  . furosemide (LASIX) 20 MG tablet Take 1  tablet (20 mg total) by mouth 2 (two) times daily.  14 tablet  0  . gabapentin (NEURONTIN) 100 MG capsule Take 100 mg by mouth 3 (three) times daily.      Marland Kitchen HYDROcodone-acetaminophen (NORCO/VICODIN) 5-325 MG per tablet 1 OR 2 PO Q4H PRN PAIN  20 tablet  0  . levothyroxine (SYNTHROID, LEVOTHROID) 112 MCG tablet Take 112 mcg by mouth daily.        . pantoprazole (PROTONIX) 40 MG tablet Take 40 mg by mouth every morning.      . predniSONE (DELTASONE) 10 MG tablet 10/22-10/24: Take 40 mg by mouth with food daily. 10/25-10/27: Take 20 mg by mouth daily with food. 10/28-10/30: Take 10 mg by mouth daily with food then stop.  21 tablet  0  . ramipril (ALTACE) 5 MG capsule Take 5 mg by mouth daily.        . simvastatin (ZOCOR) 40 MG tablet Take 40 mg by mouth at bedtime.        . theophylline (THEODUR) 200 MG 12 hr tablet Take 400 mg by mouth 2 (two) times daily.       No current facility-administered medications for this visit.    Past Medical History  Diagnosis Date  . Asthma   . COPD (chronic obstructive pulmonary disease)   . Hypothyroidism   .  Essential hypertension, benign   . Hyperlipidemia   . Type 2 diabetes mellitus   . Atrial fibrillation   . MI (myocardial infarction)     2012  . Pericardial effusion 09/09/2013    Past Surgical History  Procedure Laterality Date  . Hernia repair    . Abdominal hysterectomy    . Stomach surgery      Wound vac currently in place  . Esophagogastroduodenoscopy  11/11/2011    Procedure: ESOPHAGOGASTRODUODENOSCOPY (EGD);  Surgeon: Malissa Hippo, MD;  Location: [facility];  Service: Endoscopy;  Laterality: N/A;  . Foreign body removal  11/11/2011    Procedure: FOREIGN BODY REMOVAL;  Surgeon: Malissa Hippo, MD;  Location: [facility];  Service: Endoscopy;  Laterality: N/A;  . Tracheal surgery    . Abdominal surgery      ROS: PHYSICAL EXAM There were no vitals taken for this visit.  EKG:  ASSESSMENT AND PLAN

## 2013-10-27 NOTE — Assessment & Plan Note (Signed)
LVEF 50-55% with grade 2 diastolic dysfunction by echocardiogram in October. We will intensify Lasix to 80 mg daily, followup BMET and clinical visit with Ms. Lawrence NP within the next 2 weeks.

## 2013-10-27 NOTE — Assessment & Plan Note (Signed)
History of significant COPD. Currently pulmonologist is in [place]. I offered to try and assist with a referral to a closer pulmonary specialists, she will consider this and speak with her daughter-in-law.

## 2013-10-27 NOTE — Progress Notes (Signed)
Clinical Summary Joanna Perez is a medically complex 57 y.o.female last seen by Joanna Perez in mid-November. I reviewed her history including consultation at [facility] back in October. She reports chronic shortness of breath, also weight gain nearing 10 pounds over the last few weeks. She is on chronic steroids, states that she is watching her diet and fluid intake.  Followup echocardiogram in October demonstrated LVEF 50-55%, grade 2 diastolic dysfunction, trivial pericardial effusion which had reduced significantly in size in comparison to the prior study.  Lab work from November showed potassium 3.9, BUN 16, creatinine 0.6, hemoglobin 13.6, platelets 158.  We discussed her medications. She has actually been taking total of 60 mg Lasix daily. Reports reasonable urine output.  Her pulmonologist is in [place], she is not scheduled to see them until March. She sees her primary care physician in February.   Allergies  Allergen Reactions  . Bactrim [Sulfamethoxazole-Tmp Ds] Hives  . Ciprofloxacin Hcl   . Erythromycin Hives  . Keflex [Cephalexin] Hives  . Penicillins Itching and Swelling    Sweating    Current Outpatient Prescriptions  Medication Sig Dispense Refill  . albuterol (PROVENTIL HFA;VENTOLIN HFA) 108 (90 BASE) MCG/ACT inhaler Inhale 2 puffs into the lungs every 4 (four) hours as needed. Shortness of breath       . albuterol (PROVENTIL) 2 MG tablet Take 2 mg by mouth 3 (three) times daily.        Marland Kitchen albuterol-ipratropium (COMBIVENT) 18-103 MCG/ACT inhaler Inhale 2 puffs into the lungs every 6 (six) hours as needed for wheezing or shortness of breath.      . ALPRAZolam (XANAX) 1 MG tablet Take 1 mg by mouth 3 (three) times daily.        Marland Kitchen apixaban (ELIQUIS) 5 MG TABS tablet Take 1 tablet (5 mg total) by mouth 2 (two) times daily.  60 tablet  6  . Ascorbic Acid (VITAMIN C) 1000 MG tablet Take 1,000 mg by mouth daily.      . budesonide-formoterol (SYMBICORT)  80-4.5 MCG/ACT inhaler Inhale 2 puffs into the lungs 2 (two) times daily.        . citalopram (CELEXA) 10 MG tablet Take 10 mg by mouth daily.        Marland Kitchen diltiazem (CARDIZEM CD) 240 MG 24 hr capsule Take 1 capsule (240 mg total) by mouth daily.  30 capsule  6  . gabapentin (NEURONTIN) 100 MG capsule Take 100 mg by mouth 3 (three) times daily.      Marland Kitchen HYDROcodone-acetaminophen (NORCO/VICODIN) 5-325 MG per tablet 1 OR 2 PO Q4H PRN PAIN  20 tablet  0  . levothyroxine (SYNTHROID, LEVOTHROID) 112 MCG tablet Take 112 mcg by mouth daily.        . pantoprazole (PROTONIX) 40 MG tablet Take 40 mg by mouth every morning.      . predniSONE (DELTASONE) 10 MG tablet 10/22-10/24: Take 40 mg by mouth with food daily. 10/25-10/27: Take 20 mg by mouth daily with food. 10/28-10/30: Take 10 mg by mouth daily with food then stop.  21 tablet  0  . ramipril (ALTACE) 5 MG capsule Take 5 mg by mouth daily.        . simvastatin (ZOCOR) 40 MG tablet Take 40 mg by mouth at bedtime.        . theophylline (THEODUR) 200 MG 12 hr tablet Take 400 mg by mouth 2 (two) times daily.      . furosemide (LASIX) 40 MG tablet  Take 1 tablet (40 mg total) by mouth 2 (two) times daily.  60 tablet  6   No current facility-administered medications for this visit.    Past Medical History  Diagnosis Date  . Asthma   . COPD (chronic obstructive pulmonary disease)   . Hypothyroidism   . Essential hypertension, benign   . Hyperlipidemia   . Type 2 diabetes mellitus   . Atrial fibrillation   . MI (myocardial infarction)     2012  . Pericardial effusion 09/09/2013    Social History Joanna Perez reports that she has been smoking Cigarettes.  She has a 52.5 pack-year smoking history. She has never used smokeless tobacco. Joanna Perez reports that she drinks alcohol.  Review of Systems No palpitations. Chronic cough. No hemoptysis. No syncope.  Physical Examination Filed Vitals:   10/27/13 1502  BP: 114/51  Pulse: 96   Filed Weights    10/27/13 1502  Weight: 210 lb (95.255 kg)   Chronically ill-appearing overweight woman, no distress. HEENT: Conjunctiva and lids normal, oropharynx clear with poor dentition. Neck: Supple, no elevated JVP or carotid bruits, no thyromegaly. Lungs: Decreased breath sounds with prolonged expiratory phase, nonlabored breathing at rest. Cardiac: Regular rate and rhythm, distant, no S3, no pericardial rub. Abdomen: Soft, nontender, protuberant, bowel sounds present, no guarding or rebound. Extremities: 1+ edema, distal pulses 1-2+. Skin: Warm and dry. Scattered ecchymoses. Musculoskeletal: No kyphosis. Neuropsychiatric: Alert and oriented x3, affect grossly appropriate.   Problem List and Plan   Atrial fibrillation Paroxysmal by history. Plan is to continue Eliquis and Cardizem CD for now.  Chronic diastolic heart failure LVEF 50-55% with grade 2 diastolic dysfunction by echocardiogram in October. We will intensify Lasix to 80 mg daily, followup BMET and clinical visit with Joanna Perez within the next 2 weeks.  Pericardial effusion Trivial by followup echocardiogram in October. May need limited echocardiogram if no clinical improvement next visit on intensified diuretic.  Essential hypertension, benign Blood pressure is normal today.  Cigarette smoker Smoking cessation has been recommended, she has not been able to quit over time.  Chronic respiratory failure History of significant COPD. Currently pulmonologist is in [place]. I offered to try and assist with a referral to a closer pulmonary specialists, she will consider this and speak with her daughter-in-law.    Jonelle Sidle, M.D., F.A.C.C.

## 2013-10-27 NOTE — Patient Instructions (Addendum)
Your physician recommends that you schedule a follow-up appointment in: WEEK TO A WEEK AND A HALF WITH Lorin Picket NP DEPENDING ON AVAILABILITY   Your physician has recommended you make the following change in your medication:  1) INCREASE YOUR LASIX TO 40MG  TWICE DAILY  Your physician recommends that you return for lab work in: PRIOR TO YOUR FOLLOW UP VISIT TO BE DISCUSSED AT YOUR OFFICE VISIT

## 2013-10-27 NOTE — Assessment & Plan Note (Signed)
Smoking cessation has been recommended, she has not been able to quit over time.

## 2013-11-01 LAB — BASIC METABOLIC PANEL
BUN: 21 mg/dL (ref 6–23)
Calcium: 9.5 mg/dL (ref 8.4–10.5)
Sodium: 137 mEq/L (ref 135–145)

## 2013-11-02 ENCOUNTER — Encounter: Payer: Self-pay | Admitting: *Deleted

## 2013-11-03 ENCOUNTER — Encounter: Payer: Self-pay | Admitting: Adult Health

## 2013-11-03 ENCOUNTER — Ambulatory Visit (HOSPITAL_COMMUNITY)
Admission: RE | Admit: 2013-11-03 | Discharge: 2013-11-03 | Disposition: A | Discharge status: Home / Self Care | Payer: Medicaid Other | Source: Ambulatory Visit | Attending: Adult Health | Admitting: Adult Health

## 2013-11-03 ENCOUNTER — Ambulatory Visit (INDEPENDENT_AMBULATORY_CARE_PROVIDER_SITE_OTHER): Payer: Medicaid Other | Admitting: Adult Health

## 2013-11-03 VITALS — BP 120/61 | HR 88 | Ht 64.0 in | Wt 210.0 lb

## 2013-11-03 DIAGNOSIS — R0602 Shortness of breath: Secondary | ICD-10-CM

## 2013-11-03 DIAGNOSIS — R0989 Other specified symptoms and signs involving the circulatory and respiratory systems: Secondary | ICD-10-CM | POA: Insufficient documentation

## 2013-11-03 DIAGNOSIS — I10 Essential (primary) hypertension: Secondary | ICD-10-CM

## 2013-11-03 DIAGNOSIS — R059 Cough, unspecified: Secondary | ICD-10-CM | POA: Insufficient documentation

## 2013-11-03 DIAGNOSIS — R05 Cough: Secondary | ICD-10-CM | POA: Insufficient documentation

## 2013-11-03 DIAGNOSIS — I5032 Chronic diastolic (congestive) heart failure: Secondary | ICD-10-CM

## 2013-11-03 DIAGNOSIS — J449 Chronic obstructive pulmonary disease, unspecified: Secondary | ICD-10-CM

## 2013-11-03 NOTE — Patient Instructions (Signed)
Your physician recommends that you schedule a follow-up appointment in: 3 MONTHS WITH Dr. Diona Browner   A chest x-ray takes a picture of the organs and structures inside the chest, including the heart, lungs, and blood vessels. This test can show several things, including, whether the heart is enlarges; whether fluid is building up in the lungs; and whether pacemaker / defibrillator leads are still in place.YOU DO NOT NEED TO STAY FOR THE RESULTS

## 2013-11-03 NOTE — Progress Notes (Signed)
HPI: Joanna Perez is a 57 year old medically complex patient of Dr. Diona Browner, we are following for ongoing assessment and management of atrial fibrillation, chronic diastolic heart failure, and hypertension. She was last seen by Dr. Diona Browner on 10/27/2013 at which time, her Lasix was increased to 80 mg daily with a followup BMET. She was continued on Eliquis and Cardizem. She was counseled on smoking cessation.  Followup BMET demonstrated sodium of 137 potassium 4.6 chloride 100 CO2 30 BUN 21 creatinine 0.74.    She comes today with increased wt with the prednisone. Her breathing status is worsened subjectively, with increased coughing and wheezing. She denies chest pain or increased edema. She is followed by Dr. Gillian Scarce, pulmonologist at [facility] and is due to see him this month.  Allergies  Allergen Reactions  . Bactrim [Sulfamethoxazole-Tmp Ds] Hives  . Ciprofloxacin Hcl   . Erythromycin Hives  . Keflex [Cephalexin] Hives  . Penicillins Itching and Swelling    Sweating    Current Outpatient Prescriptions  Medication Sig Dispense Refill  . albuterol (PROVENTIL HFA) 108 (90 BASE) MCG/ACT inhaler 1 puff every 4 (four) hours as needed.       . cyclobenzaprine (FLEXERIL) 5 MG tablet 5 mg 3 (three) times daily as needed.       . gabapentin (NEURONTIN) 100 MG capsule 100 mg.      . oxyCODONE (ROXICODONE) 5 MG immediate release tablet 5 mg.      . promethazine (PHENERGAN) 12.5 MG tablet 12.5 mg.      . albuterol-ipratropium (COMBIVENT) 18-103 MCG/ACT inhaler Inhale 2 puffs into the lungs every 6 (six) hours as needed for wheezing or shortness of breath.      . ALPRAZolam (XANAX) 1 MG tablet 1 mg 3 (three) times daily.       Marland Kitchen apixaban (ELIQUIS) 5 MG TABS tablet Take 1 tablet (5 mg total) by mouth 2 (two) times daily.  60 tablet  6  . Ascorbic Acid (VITAMIN C) 1000 MG tablet 1,000 mg daily.       . bisacodyl (STIMULANT LAXATIVE) 5 MG EC tablet 5 mg daily as needed.       .  budesonide-formoterol (SYMBICORT) 160-4.5 MCG/ACT inhaler 2 puffs.      . budesonide-formoterol (SYMBICORT) 80-4.5 MCG/ACT inhaler Inhale 2 puffs into the lungs 2 (two) times daily.        . citalopram (CELEXA) 10 MG tablet Take 10 mg by mouth daily.        Marland Kitchen diltiazem (CARDIZEM CD) 240 MG 24 hr capsule Take 1 capsule (240 mg total) by mouth daily.  30 capsule  6  . fluticasone (FLONASE) 50 MCG/ACT nasal spray 1 spray.      . furosemide (LASIX) 20 MG tablet 20 mg.      . guaiFENesin (MUCINEX) 600 MG 12 hr tablet 1,200 mg.      . HYDROcodone-acetaminophen (NORCO) 10-325 MG per tablet 1 tablet.      Marland Kitchen levothyroxine (SYNTHROID, LEVOTHROID) 112 MCG tablet 112 mcg.      . pantoprazole (PROTONIX) 40 MG tablet 40 mg.      . predniSONE (DELTASONE) 10 MG tablet 10/22-10/24: Take 40 mg by mouth with food daily. 10/25-10/27: Take 20 mg by mouth daily with food. 10/28-10/30: Take 10 mg by mouth daily with food then stop.  21 tablet  0  . ramipril (ALTACE) 10 MG capsule 10 mg.      . ramipril (ALTACE) 5 MG capsule Take 5 mg by mouth daily.        Marland Kitchen  simvastatin (ZOCOR) 40 MG tablet Take 40 mg by mouth daily at 6 PM.       . theophylline (THEO-24) 200 MG 24 hr capsule 200 mg.      . zolpidem (AMBIEN) 5 MG tablet 5 mg.       No current facility-administered medications for this visit.    Past Medical History  Diagnosis Date  . Asthma   . COPD (chronic obstructive pulmonary disease)   . Hypothyroidism   . Essential hypertension, benign   . Hyperlipidemia   . Type 2 diabetes mellitus   . Atrial fibrillation   . MI (myocardial infarction)     2012  . Pericardial effusion 09/09/2013    Past Surgical History  Procedure Laterality Date  . Hernia repair    . Abdominal hysterectomy    . Stomach surgery      Wound vac currently in place  . Esophagogastroduodenoscopy  11/11/2011    Procedure: ESOPHAGOGASTRODUODENOSCOPY (EGD);  Surgeon: Malissa Hippo, MD;  Location: [facility];  Service:  Endoscopy;  Laterality: N/A;  . Foreign body removal  11/11/2011    Procedure: FOREIGN BODY REMOVAL;  Surgeon: Malissa Hippo, MD;  Location: [facility];  Service: Endoscopy;  Laterality: N/A;  . Tracheal surgery    . Abdominal surgery      ROS: PHYSICAL EXAM BP 120/61  Pulse 88  Ht 5\' 4"  (1.626 m)  Wt 210 lb (95.255 kg)  BMI 36.03 kg/m2  General: Well developed, well nourished, obese, in no acute distress, moon-faced. Head: Eyes PERRLA, No xanthomas.   Normal cephalic and atramatic  Lungs: Inspiratory and expiratory wheezes, and rhonchi with frequent coughing during inspiration. Heart: HRRR S1 S2,  Distant HS, difficult to auscultate due to breath sounds, without MRG.  Pulses are 2+ & equal.            No carotid bruit. No JVD.  No abdominal bruits. No femoral bruits. Abdomen: Bowel sounds are positive, abdomen soft and non-tender without masses or                  Hernia's noted.Obese. Msk:  Back kyphosis is noted, normal gait. Normal strength and tone for age. Extremities: Positive for clubbing, cyanosis no edema.  DP +1 Neuro: Alert and oriented X 3. Psych:  Flat affect, responds appropriately    ASSESSMENT AND PLAN

## 2013-11-03 NOTE — Progress Notes (Deleted)
Name: Joanna Perez    DOB: Sep 19, 1956  Age: 57 y.o.  MR#: [id number]       PCP:  Cassell Smiles., MD      Insurance: Payor: MEDICAID [place] / Plan: MEDICAID [place] ACCESS / Product Type: *No Product type* /   CC:    Chief Complaint  Patient presents with  . Atrial Fibrillation    VS Filed Vitals:   11/03/13 1420  BP: 120/61  Pulse: 88  Height: 5\' 4"  (1.626 m)  Weight: 210 lb (95.255 kg)    Weights Current Weight  11/03/13 210 lb (95.255 kg)  10/27/13 210 lb (95.255 kg)  09/29/13 191 lb (86.637 kg)    Blood Pressure  BP Readings from Last 3 Encounters:  11/03/13 120/61  10/27/13 114/51  09/29/13 138/64     Admit date:  (Not on file) Last encounter with RMR:  09/29/2013   Allergy Bactrim; Ciprofloxacin hcl; Erythromycin; Keflex; and Penicillins  Current Outpatient Prescriptions  Medication Sig Dispense Refill  . albuterol (PROVENTIL HFA) 108 (90 BASE) MCG/ACT inhaler 1 puff every 4 (four) hours as needed.       . cyclobenzaprine (FLEXERIL) 5 MG tablet 5 mg 3 (three) times daily as needed.       . gabapentin (NEURONTIN) 100 MG capsule 100 mg.      . oxyCODONE (ROXICODONE) 5 MG immediate release tablet 5 mg.      . promethazine (PHENERGAN) 12.5 MG tablet 12.5 mg.      . albuterol-ipratropium (COMBIVENT) 18-103 MCG/ACT inhaler Inhale 2 puffs into the lungs every 6 (six) hours as needed for wheezing or shortness of breath.      . ALPRAZolam (XANAX) 1 MG tablet 1 mg 3 (three) times daily.       Marland Kitchen apixaban (ELIQUIS) 5 MG TABS tablet Take 1 tablet (5 mg total) by mouth 2 (two) times daily.  60 tablet  6  . Ascorbic Acid (VITAMIN C) 1000 MG tablet 1,000 mg daily.       . bisacodyl (STIMULANT LAXATIVE) 5 MG EC tablet 5 mg daily as needed.       . budesonide-formoterol (SYMBICORT) 160-4.5 MCG/ACT inhaler 2 puffs.      . budesonide-formoterol (SYMBICORT) 80-4.5 MCG/ACT inhaler Inhale 2 puffs into the lungs 2 (two) times daily.        . citalopram (CELEXA) 10 MG tablet Take 10  mg by mouth daily.        Marland Kitchen diltiazem (CARDIZEM CD) 240 MG 24 hr capsule Take 1 capsule (240 mg total) by mouth daily.  30 capsule  6  . fluticasone (FLONASE) 50 MCG/ACT nasal spray 1 spray.      . furosemide (LASIX) 20 MG tablet 20 mg.      . guaiFENesin (MUCINEX) 600 MG 12 hr tablet 1,200 mg.      . HYDROcodone-acetaminophen (NORCO) 10-325 MG per tablet 1 tablet.      Marland Kitchen levothyroxine (SYNTHROID, LEVOTHROID) 112 MCG tablet 112 mcg.      . pantoprazole (PROTONIX) 40 MG tablet 40 mg.      . predniSONE (DELTASONE) 10 MG tablet 10/22-10/24: Take 40 mg by mouth with food daily. 10/25-10/27: Take 20 mg by mouth daily with food. 10/28-10/30: Take 10 mg by mouth daily with food then stop.  21 tablet  0  . ramipril (ALTACE) 10 MG capsule 10 mg.      . ramipril (ALTACE) 5 MG capsule Take 5 mg by mouth daily.        Marland Kitchen  simvastatin (ZOCOR) 40 MG tablet Take 40 mg by mouth daily at 6 PM.       . theophylline (THEO-24) 200 MG 24 hr capsule 200 mg.      . zolpidem (AMBIEN) 5 MG tablet 5 mg.       No current facility-administered medications for this visit.    Discontinued Meds:    Medications Discontinued During This Encounter  Medication Reason  . theophylline (THEODUR) 200 MG 12 hr tablet Dose change  . simvastatin (ZOCOR) 40 MG tablet Dose change  . pantoprazole (PROTONIX) 40 MG tablet Dose change  . levothyroxine (SYNTHROID, LEVOTHROID) 112 MCG tablet Dose change  . HYDROcodone-acetaminophen (NORCO/VICODIN) 5-325 MG per tablet Dose change  . gabapentin (NEURONTIN) 100 MG capsule Dose change  . furosemide (LASIX) 40 MG tablet Dose change  . Ascorbic Acid (VITAMIN C) 1000 MG tablet Dose change  . ALPRAZolam (XANAX) 1 MG tablet Dose change  . albuterol (PROVENTIL) 2 MG tablet Dose change  . albuterol (PROVENTIL HFA;VENTOLIN HFA) 108 (90 BASE) MCG/ACT inhaler Dose change  . Elastic Bandages & Supports (O-2 SUSPENSORY) MISC Error  . fexofenadine-pseudoephedrine (ALLEGRA-D) 60-120 MG per tablet  Error    Patient Active Problem List   Diagnosis Date Noted  . Chronic diastolic heart failure 10/27/2013  . Pericardial effusion 09/09/2013  . Atrial fibrillation 09/08/2013  . Chronic respiratory failure 09/08/2013  . Cigarette smoker 09/08/2013  . HYPOTHYROIDISM 09/23/2010  . Type 2 diabetes mellitus 09/23/2010  . DEPRESSION 09/23/2010  . Essential hypertension, benign 09/23/2010  . ASTHMA 09/23/2010  . COPD 09/23/2010  . BACK PAIN, CHRONIC 09/23/2010    LABS    Component Value Date/Time   NA 137 10/31/2013 1055   NA 140 09/29/2013 1600   NA 139 09/12/2013 0535   K 4.6 10/31/2013 1055   K 3.9 09/29/2013 1600   K 4.4 09/12/2013 0535   CL 100 10/31/2013 1055   CL 103 09/29/2013 1600   CL 97 09/12/2013 0535   CO2 30 10/31/2013 1055   CO2 29 09/29/2013 1600   CO2 36* 09/12/2013 0535   GLUCOSE 115* 10/31/2013 1055   GLUCOSE 118* 09/29/2013 1600   GLUCOSE 190* 09/12/2013 0535   BUN 21 10/31/2013 1055   BUN 16 09/29/2013 1600   BUN 27* 09/12/2013 0535   CREATININE 0.74 10/31/2013 1055   CREATININE 0.64 09/29/2013 1600   CREATININE 0.58 09/12/2013 0535   CREATININE 0.66 09/10/2013 0442   CREATININE 0.75 09/09/2013 0513   CALCIUM 9.5 10/31/2013 1055   CALCIUM 8.8 09/29/2013 1600   CALCIUM 10.1 09/12/2013 0535   GFRNONAA >90 09/12/2013 0535   GFRNONAA >90 09/10/2013 0442   GFRNONAA >90 09/09/2013 0513   GFRAA >90 09/12/2013 0535   GFRAA >90 09/10/2013 0442   GFRAA >90 09/09/2013 0513   CMP     Component Value Date/Time   NA 137 10/31/2013 1055   K 4.6 10/31/2013 1055   CL 100 10/31/2013 1055   CO2 30 10/31/2013 1055   GLUCOSE 115* 10/31/2013 1055   BUN 21 10/31/2013 1055   CREATININE 0.74 10/31/2013 1055   CREATININE 0.58 09/12/2013 0535   CALCIUM 9.5 10/31/2013 1055   PROT 7.5 09/08/2013 1011   ALBUMIN 3.0* 09/08/2013 1011   AST 22 09/08/2013 1011   ALT 17 09/08/2013 1011   ALKPHOS 80 09/08/2013 1011   BILITOT 0.3 09/08/2013 1011   GFRNONAA >90 09/12/2013 0535   GFRAA >90  09/12/2013 0535       Component Value Date/Time  WBC 6.5 09/29/2013 1600   WBC 9.9 09/12/2013 0535   WBC 16.1* 09/09/2013 0513   HGB 13.6 09/29/2013 1600   HGB 13.7 09/12/2013 0535   HGB 14.0 09/09/2013 0513   HCT 40.7 09/29/2013 1600   HCT 41.8 09/12/2013 0535   HCT 42.6 09/09/2013 0513   MCV 75.1* 09/29/2013 1600   MCV 76.3* 09/12/2013 0535   MCV 76.5* 09/09/2013 0513    Lipid Panel  No results found for this basename: chol, trig, hdl, cholhdl, vldl, ldlcalc    ABG No results found for this basename: phart, pco2, pco2art, po2, po2art, hco3, tco2, acidbasedef, o2sat     Lab Results  Component Value Date   TSH 2.356 09/08/2013   BNP (last 3 results)  Recent Labs  08/27/13 1022 09/08/13 1011  PROBNP 959.3* 1725.0*   Cardiac Panel (last 3 results) No results found for this basename: CKTOTAL, CKMB, TROPONINI, RELINDX,  in the last 72 hours  Iron/TIBC/Ferritin No results found for this basename: iron, tibc, ferritin     EKG Orders placed during the hospital encounter of 09/08/13  . EKG 12-LEAD  . EKG 12-LEAD  . EKG 12-LEAD  . EKG 12-LEAD  . EKG     Prior Assessment and Plan Problem List as of 11/03/2013   HYPOTHYROIDISM   Type 2 diabetes mellitus   DEPRESSION   Essential hypertension, benign   Last Assessment & Plan   10/27/2013 Office Visit Written 10/27/2013  3:47 PM by Jonelle Sidle, MD     Blood pressure is normal today.    ASTHMA   COPD   Last Assessment & Plan   09/29/2013 Office Visit Written 09/29/2013  4:10 PM by Jodelle Gross, NP     She remains on O2 at home. She is parameters to wear if her saturations drop below 90%. I have advised her on smoking cessation as she continues to smoke despite her lung disease. She verbalizes understanding. She is to followup with pulmonology as previously scheduled.    BACK PAIN, CHRONIC   Atrial fibrillation   Last Assessment & Plan   10/27/2013 Office Visit Written 10/27/2013  3:44 PM by Jonelle Sidle, MD     Paroxysmal by history. Plan is to continue Eliquis and Cardizem CD for now.    Chronic respiratory failure   Last Assessment & Plan   10/27/2013 Office Visit Written 10/27/2013  3:48 PM by Jonelle Sidle, MD     History of significant COPD. Currently pulmonologist is in [place]. I offered to try and assist with a referral to a closer pulmonary specialists, she will consider this and speak with her daughter-in-law.    Cigarette smoker   Last Assessment & Plan   10/27/2013 Office Visit Written 10/27/2013  3:47 PM by Jonelle Sidle, MD     Smoking cessation has been recommended, she has not been able to quit over time.    Pericardial effusion   Last Assessment & Plan   10/27/2013 Office Visit Written 10/27/2013  3:47 PM by Jonelle Sidle, MD     Trivial by followup echocardiogram in October. May need limited echocardiogram if no clinical improvement next visit on intensified diuretic.    Chronic diastolic heart failure   Last Assessment & Plan   10/27/2013 Office Visit Written 10/27/2013  3:45 PM by Jonelle Sidle, MD     LVEF 50-55% with grade 2 diastolic dysfunction by echocardiogram in October. We will intensify Lasix to 80 mg daily, followup  BMET and clinical visit with Ms. Lawrence NP within the next 2 weeks.        Imaging: No results found.

## 2013-11-03 NOTE — Assessment & Plan Note (Signed)
Well controlled currently. Will continue current medication regimen. It has been reviewed and updated concerning meds and dosages, as there were duplicates from [place] and our office.

## 2013-11-03 NOTE — Assessment & Plan Note (Signed)
She comes today having gained more weight but has no evidence of significant LEE. She is taking lasix 40 mg BID now. I suspect the steroids are influencing weight gain. CXR is being completed today.

## 2013-11-03 NOTE — Progress Notes (Deleted)
Name: Joanna Perez    DOB: 1956-03-14  Age: 57 y.o.  MR#: [id number]       PCP:  Cassell Smiles., MD      Insurance: Payor: MEDICAID [place] / Plan: MEDICAID [place] ACCESS / Product Type: *No Product type* /   CC:    Chief Complaint  Patient presents with  . Atrial Fibrillation    VS Filed Vitals:   11/03/13 1420  BP: 120/61  Pulse: 88  Height: 5\' 4"  (1.626 m)  Weight: 210 lb (95.255 kg)    Weights Current Weight  11/03/13 210 lb (95.255 kg)  10/27/13 210 lb (95.255 kg)  09/29/13 191 lb (86.637 kg)    Blood Pressure  BP Readings from Last 3 Encounters:  11/03/13 120/61  10/27/13 114/51  09/29/13 138/64     Admit date:  (Not on file) Last encounter with RMR:  09/29/2013   Allergy Bactrim; Ciprofloxacin hcl; Erythromycin; Keflex; and Penicillins  Current Outpatient Prescriptions  Medication Sig Dispense Refill  . albuterol (PROVENTIL HFA) 108 (90 BASE) MCG/ACT inhaler 1 puff every 4 (four) hours as needed.       . cyclobenzaprine (FLEXERIL) 5 MG tablet Take 5 mg by mouth 3 (three) times daily as needed.       . gabapentin (NEURONTIN) 100 MG capsule Take 100 mg by mouth 3 (three) times daily.       Marland Kitchen oxyCODONE (ROXICODONE) 5 MG immediate release tablet 5 mg.      . promethazine (PHENERGAN) 12.5 MG tablet 12.5 mg.      . albuterol-ipratropium (COMBIVENT) 18-103 MCG/ACT inhaler Inhale 2 puffs into the lungs every 6 (six) hours as needed for wheezing or shortness of breath.      . ALPRAZolam (XANAX) 1 MG tablet Take 1 mg by mouth 3 (three) times daily.       Marland Kitchen apixaban (ELIQUIS) 5 MG TABS tablet Take 1 tablet (5 mg total) by mouth 2 (two) times daily.  60 tablet  6  . Ascorbic Acid (VITAMIN C) 1000 MG tablet Take 1,000 mg by mouth daily.       . bisacodyl (STIMULANT LAXATIVE) 5 MG EC tablet Take 5 mg by mouth daily as needed.       . budesonide-formoterol (SYMBICORT) 160-4.5 MCG/ACT inhaler Inhale 2 puffs into the lungs.       . budesonide-formoterol (SYMBICORT) 80-4.5  MCG/ACT inhaler Inhale 2 puffs into the lungs 2 (two) times daily.        . citalopram (CELEXA) 10 MG tablet Take 10 mg by mouth daily.        Marland Kitchen diltiazem (CARDIZEM CD) 240 MG 24 hr capsule Take 1 capsule (240 mg total) by mouth daily.  30 capsule  6  . fluticasone (FLONASE) 50 MCG/ACT nasal spray Place 1 spray into both nostrils.       . furosemide (LASIX) 20 MG tablet Take 40 mg by mouth 2 (two) times daily.       Marland Kitchen guaiFENesin (MUCINEX) 600 MG 12 hr tablet Take 1,200 mg by mouth.       Marland Kitchen HYDROcodone-acetaminophen (NORCO) 10-325 MG per tablet Take 1 tablet by mouth every 6 (six) hours as needed.       Marland Kitchen levothyroxine (SYNTHROID, LEVOTHROID) 112 MCG tablet Take 112 mcg by mouth daily before breakfast.       . pantoprazole (PROTONIX) 40 MG tablet Take 40 mg by mouth daily.       . predniSONE (DELTASONE) 10 MG tablet 10/22-10/24: Take  40 mg by mouth with food daily. 10/25-10/27: Take 20 mg by mouth daily with food. 10/28-10/30: Take 10 mg by mouth daily with food then stop.  21 tablet  0  . ramipril (ALTACE) 10 MG capsule 10 mg.      . ramipril (ALTACE) 5 MG capsule Take 5 mg by mouth daily.       . simvastatin (ZOCOR) 40 MG tablet Take 40 mg by mouth daily at 6 PM.       . theophylline (THEO-24) 200 MG 24 hr capsule 400 mg 2 (two) times daily.       Marland Kitchen zolpidem (AMBIEN) 5 MG tablet 5 mg at bedtime.        No current facility-administered medications for this visit.    Discontinued Meds:    Medications Discontinued During This Encounter  Medication Reason  . theophylline (THEODUR) 200 MG 12 hr tablet Dose change  . simvastatin (ZOCOR) 40 MG tablet Dose change  . pantoprazole (PROTONIX) 40 MG tablet Dose change  . levothyroxine (SYNTHROID, LEVOTHROID) 112 MCG tablet Dose change  . HYDROcodone-acetaminophen (NORCO/VICODIN) 5-325 MG per tablet Dose change  . gabapentin (NEURONTIN) 100 MG capsule Dose change  . furosemide (LASIX) 40 MG tablet Dose change  . Ascorbic Acid (VITAMIN C) 1000 MG  tablet Dose change  . ALPRAZolam (XANAX) 1 MG tablet Dose change  . albuterol (PROVENTIL) 2 MG tablet Dose change  . albuterol (PROVENTIL HFA;VENTOLIN HFA) 108 (90 BASE) MCG/ACT inhaler Dose change  . Elastic Bandages & Supports (O-2 SUSPENSORY) MISC Error  . fexofenadine-pseudoephedrine (ALLEGRA-D) 60-120 MG per tablet Error    Patient Active Problem List   Diagnosis Date Noted  . Chronic diastolic heart failure 10/27/2013  . Pericardial effusion 09/09/2013  . Atrial fibrillation 09/08/2013  . Chronic respiratory failure 09/08/2013  . Cigarette smoker 09/08/2013  . HYPOTHYROIDISM 09/23/2010  . Type 2 diabetes mellitus 09/23/2010  . DEPRESSION 09/23/2010  . Essential hypertension, benign 09/23/2010  . ASTHMA 09/23/2010  . COPD 09/23/2010  . BACK PAIN, CHRONIC 09/23/2010    LABS    Component Value Date/Time   NA 137 10/31/2013 1055   NA 140 09/29/2013 1600   NA 139 09/12/2013 0535   K 4.6 10/31/2013 1055   K 3.9 09/29/2013 1600   K 4.4 09/12/2013 0535   CL 100 10/31/2013 1055   CL 103 09/29/2013 1600   CL 97 09/12/2013 0535   CO2 30 10/31/2013 1055   CO2 29 09/29/2013 1600   CO2 36* 09/12/2013 0535   GLUCOSE 115* 10/31/2013 1055   GLUCOSE 118* 09/29/2013 1600   GLUCOSE 190* 09/12/2013 0535   BUN 21 10/31/2013 1055   BUN 16 09/29/2013 1600   BUN 27* 09/12/2013 0535   CREATININE 0.74 10/31/2013 1055   CREATININE 0.64 09/29/2013 1600   CREATININE 0.58 09/12/2013 0535   CREATININE 0.66 09/10/2013 0442   CREATININE 0.75 09/09/2013 0513   CALCIUM 9.5 10/31/2013 1055   CALCIUM 8.8 09/29/2013 1600   CALCIUM 10.1 09/12/2013 0535   GFRNONAA >90 09/12/2013 0535   GFRNONAA >90 09/10/2013 0442   GFRNONAA >90 09/09/2013 0513   GFRAA >90 09/12/2013 0535   GFRAA >90 09/10/2013 0442   GFRAA >90 09/09/2013 0513   CMP     Component Value Date/Time   NA 137 10/31/2013 1055   K 4.6 10/31/2013 1055   CL 100 10/31/2013 1055   CO2 30 10/31/2013 1055   GLUCOSE 115* 10/31/2013 1055   BUN 21  10/31/2013 1055  CREATININE 0.74 10/31/2013 1055   CREATININE 0.58 09/12/2013 0535   CALCIUM 9.5 10/31/2013 1055   PROT 7.5 09/08/2013 1011   ALBUMIN 3.0* 09/08/2013 1011   AST 22 09/08/2013 1011   ALT 17 09/08/2013 1011   ALKPHOS 80 09/08/2013 1011   BILITOT 0.3 09/08/2013 1011   GFRNONAA >90 09/12/2013 0535   GFRAA >90 09/12/2013 0535       Component Value Date/Time   WBC 6.5 09/29/2013 1600   WBC 9.9 09/12/2013 0535   WBC 16.1* 09/09/2013 0513   HGB 13.6 09/29/2013 1600   HGB 13.7 09/12/2013 0535   HGB 14.0 09/09/2013 0513   HCT 40.7 09/29/2013 1600   HCT 41.8 09/12/2013 0535   HCT 42.6 09/09/2013 0513   MCV 75.1* 09/29/2013 1600   MCV 76.3* 09/12/2013 0535   MCV 76.5* 09/09/2013 0513    Lipid Panel  No results found for this basename: chol, trig, hdl, cholhdl, vldl, ldlcalc    ABG No results found for this basename: phart, pco2, pco2art, po2, po2art, hco3, tco2, acidbasedef, o2sat     Lab Results  Component Value Date   TSH 2.356 09/08/2013   BNP (last 3 results)  Recent Labs  08/27/13 1022 09/08/13 1011  PROBNP 959.3* 1725.0*   Cardiac Panel (last 3 results) No results found for this basename: CKTOTAL, CKMB, TROPONINI, RELINDX,  in the last 72 hours  Iron/TIBC/Ferritin No results found for this basename: iron, tibc, ferritin     EKG Orders placed during the hospital encounter of 09/08/13  . EKG 12-LEAD  . EKG 12-LEAD  . EKG 12-LEAD  . EKG 12-LEAD  . EKG     Prior Assessment and Plan Problem List as of 11/03/2013   HYPOTHYROIDISM   Type 2 diabetes mellitus   DEPRESSION   Essential hypertension, benign   Last Assessment & Plan   10/27/2013 Office Visit Written 10/27/2013  3:47 PM by Jonelle Sidle, MD     Blood pressure is normal today.    ASTHMA   COPD   Last Assessment & Plan   09/29/2013 Office Visit Written 09/29/2013  4:10 PM by Jodelle Gross, NP     She remains on O2 at home. She is parameters to wear if her saturations drop below  90%. I have advised her on smoking cessation as she continues to smoke despite her lung disease. She verbalizes understanding. She is to followup with pulmonology as previously scheduled.    BACK PAIN, CHRONIC   Atrial fibrillation   Last Assessment & Plan   10/27/2013 Office Visit Written 10/27/2013  3:44 PM by Jonelle Sidle, MD     Paroxysmal by history. Plan is to continue Eliquis and Cardizem CD for now.    Chronic respiratory failure   Last Assessment & Plan   10/27/2013 Office Visit Written 10/27/2013  3:48 PM by Jonelle Sidle, MD     History of significant COPD. Currently pulmonologist is in [place]. I offered to try and assist with a referral to a closer pulmonary specialists, she will consider this and speak with her daughter-in-law.    Cigarette smoker   Last Assessment & Plan   10/27/2013 Office Visit Written 10/27/2013  3:47 PM by Jonelle Sidle, MD     Smoking cessation has been recommended, she has not been able to quit over time.    Pericardial effusion   Last Assessment & Plan   10/27/2013 Office Visit Written 10/27/2013  3:47 PM by Jonelle Sidle, MD  Trivial by followup echocardiogram in October. May need limited echocardiogram if no clinical improvement next visit on intensified diuretic.    Chronic diastolic heart failure   Last Assessment & Plan   10/27/2013 Office Visit Written 10/27/2013  3:45 PM by Jonelle Sidle, MD     LVEF 50-55% with grade 2 diastolic dysfunction by echocardiogram in October. We will intensify Lasix to 80 mg daily, followup BMET and clinical visit with Ms. Lawrence NP within the next 2 weeks.        Imaging: No results found.

## 2013-11-03 NOTE — Assessment & Plan Note (Signed)
She is continuing to wheeze and cough with rales and rhonchi. I will check CXR and have copy sent to Dr. Sherwood Gambler and Dr. Marya Amsler at [facility]. She is advised to see Dr. Sherwood Gambler if symptoms persist, as she thinks she is getting a cold. She unfortunately continues to smoke. She has appt with pulmonology in [place].

## 2013-11-06 NOTE — Progress Notes (Signed)
Please advise per did not see any information concerning pt to have ABT called in and pt sent an email to ask if you called the ABT in for her

## 2013-11-27 ENCOUNTER — Inpatient Hospital Stay (HOSPITAL_COMMUNITY): Payer: Medicaid Other

## 2013-11-27 ENCOUNTER — Other Ambulatory Visit: Payer: Self-pay

## 2013-11-27 ENCOUNTER — Inpatient Hospital Stay (HOSPITAL_COMMUNITY)
Admission: EM | Admit: 2013-11-27 | Discharge: 2013-11-30 | DRG: 190 | Disposition: A | Discharge status: Home Health | Payer: Medicaid Other | Attending: Internal Medicine | Admitting: Internal Medicine

## 2013-11-27 ENCOUNTER — Emergency Department (HOSPITAL_COMMUNITY): Payer: Medicaid Other

## 2013-11-27 ENCOUNTER — Encounter (HOSPITAL_COMMUNITY): Payer: Self-pay | Admitting: Emergency Medicine

## 2013-11-27 DIAGNOSIS — J9600 Acute respiratory failure, unspecified whether with hypoxia or hypercapnia: Secondary | ICD-10-CM

## 2013-11-27 DIAGNOSIS — J441 Chronic obstructive pulmonary disease with (acute) exacerbation: Principal | ICD-10-CM

## 2013-11-27 DIAGNOSIS — M549 Dorsalgia, unspecified: Secondary | ICD-10-CM

## 2013-11-27 DIAGNOSIS — I313 Pericardial effusion (noninflammatory): Secondary | ICD-10-CM

## 2013-11-27 DIAGNOSIS — Z79899 Other long term (current) drug therapy: Secondary | ICD-10-CM

## 2013-11-27 DIAGNOSIS — I509 Heart failure, unspecified: Secondary | ICD-10-CM | POA: Diagnosis present

## 2013-11-27 DIAGNOSIS — J45901 Unspecified asthma with (acute) exacerbation: Principal | ICD-10-CM

## 2013-11-27 DIAGNOSIS — E785 Hyperlipidemia, unspecified: Secondary | ICD-10-CM | POA: Diagnosis present

## 2013-11-27 DIAGNOSIS — F3289 Other specified depressive episodes: Secondary | ICD-10-CM

## 2013-11-27 DIAGNOSIS — F17200 Nicotine dependence, unspecified, uncomplicated: Secondary | ICD-10-CM | POA: Diagnosis present

## 2013-11-27 DIAGNOSIS — E039 Hypothyroidism, unspecified: Secondary | ICD-10-CM

## 2013-11-27 DIAGNOSIS — J45909 Unspecified asthma, uncomplicated: Secondary | ICD-10-CM

## 2013-11-27 DIAGNOSIS — Z9981 Dependence on supplemental oxygen: Secondary | ICD-10-CM

## 2013-11-27 DIAGNOSIS — I5032 Chronic diastolic (congestive) heart failure: Secondary | ICD-10-CM

## 2013-11-27 DIAGNOSIS — F17210 Nicotine dependence, cigarettes, uncomplicated: Secondary | ICD-10-CM

## 2013-11-27 DIAGNOSIS — J449 Chronic obstructive pulmonary disease, unspecified: Secondary | ICD-10-CM

## 2013-11-27 DIAGNOSIS — IMO0002 Reserved for concepts with insufficient information to code with codable children: Secondary | ICD-10-CM

## 2013-11-27 DIAGNOSIS — I3139 Other pericardial effusion (noninflammatory): Secondary | ICD-10-CM

## 2013-11-27 DIAGNOSIS — J9620 Acute and chronic respiratory failure, unspecified whether with hypoxia or hypercapnia: Secondary | ICD-10-CM | POA: Diagnosis present

## 2013-11-27 DIAGNOSIS — I10 Essential (primary) hypertension: Secondary | ICD-10-CM

## 2013-11-27 DIAGNOSIS — Z833 Family history of diabetes mellitus: Secondary | ICD-10-CM

## 2013-11-27 DIAGNOSIS — G8929 Other chronic pain: Secondary | ICD-10-CM | POA: Diagnosis present

## 2013-11-27 DIAGNOSIS — J9801 Acute bronchospasm: Secondary | ICD-10-CM

## 2013-11-27 DIAGNOSIS — I252 Old myocardial infarction: Secondary | ICD-10-CM

## 2013-11-27 DIAGNOSIS — E119 Type 2 diabetes mellitus without complications: Secondary | ICD-10-CM

## 2013-11-27 DIAGNOSIS — I4891 Unspecified atrial fibrillation: Secondary | ICD-10-CM

## 2013-11-27 DIAGNOSIS — J9610 Chronic respiratory failure, unspecified whether with hypoxia or hypercapnia: Secondary | ICD-10-CM

## 2013-11-27 DIAGNOSIS — J4489 Other specified chronic obstructive pulmonary disease: Secondary | ICD-10-CM

## 2013-11-27 DIAGNOSIS — F329 Major depressive disorder, single episode, unspecified: Secondary | ICD-10-CM

## 2013-11-27 LAB — BLOOD GAS, ARTERIAL
ACID-BASE EXCESS: 4.6 mmol/L — AB (ref 0.0–2.0)
Acid-Base Excess: 4.6 mmol/L — ABNORMAL HIGH (ref 0.0–2.0)
Bicarbonate: 30.7 mEq/L — ABNORMAL HIGH (ref 20.0–24.0)
Bicarbonate: 30.4 mEq/L — ABNORMAL HIGH (ref 20.0–24.0)
Delivery systems: POSITIVE
Drawn by: 27407
Drawn by: 27407
Expiratory PAP: 8
FIO2: 0.4 %
Inspiratory PAP: 12
O2 CONTENT: 3.5 L/min
O2 Saturation: 86.4 %
O2 Saturation: 89.2 %
PCO2 ART: 62.4 mmHg — AB (ref 35.0–45.0)
PH ART: 7.309 — AB (ref 7.350–7.450)
PO2 ART: 59 mmHg — AB (ref 80.0–100.0)
PO2 ART: 66.7 mmHg — AB (ref 80.0–100.0)
Patient temperature: 37
Patient temperature: 37
TCO2: 28.1 mmol/L (ref 0–100)
TCO2: 27.6 mmol/L (ref 0–100)
pCO2 arterial: 64.8 mmHg (ref 35.0–45.0)
pH, Arterial: 7.297 — ABNORMAL LOW (ref 7.350–7.450)

## 2013-11-27 LAB — COMPREHENSIVE METABOLIC PANEL
ALT: 8 U/L (ref 0–35)
AST: 9 U/L (ref 0–37)
Albumin: 3.1 g/dL — ABNORMAL LOW (ref 3.5–5.2)
Alkaline Phosphatase: 64 U/L (ref 39–117)
BUN: 12 mg/dL (ref 6–23)
CALCIUM: 9.5 mg/dL (ref 8.4–10.5)
CO2: 30 meq/L (ref 19–32)
Chloride: 99 mEq/L (ref 96–112)
Creatinine, Ser: 0.63 mg/dL (ref 0.50–1.10)
GLUCOSE: 132 mg/dL — AB (ref 70–99)
Potassium: 4 mEq/L (ref 3.7–5.3)
Sodium: 140 mEq/L (ref 137–147)
Total Bilirubin: 0.6 mg/dL (ref 0.3–1.2)
Total Protein: 6.6 g/dL (ref 6.0–8.3)

## 2013-11-27 LAB — GLUCOSE, CAPILLARY: Glucose-Capillary: 151 mg/dL — ABNORMAL HIGH (ref 70–99)

## 2013-11-27 LAB — INFLUENZA PANEL BY PCR (TYPE A & B)
H1N1FLUPCR: NOT DETECTED
INFLAPCR: NEGATIVE
Influenza B By PCR: NEGATIVE

## 2013-11-27 LAB — CBC WITH DIFFERENTIAL/PLATELET
Basophils Absolute: 0 10*3/uL (ref 0.0–0.1)
Basophils Relative: 0 % (ref 0–1)
EOS PCT: 1 % (ref 0–5)
Eosinophils Absolute: 0.1 10*3/uL (ref 0.0–0.7)
HEMATOCRIT: 44 % (ref 36.0–46.0)
HEMOGLOBIN: 13.5 g/dL (ref 12.0–15.0)
LYMPHS ABS: 2.1 10*3/uL (ref 0.7–4.0)
LYMPHS PCT: 17 % (ref 12–46)
MCH: 25.6 pg — AB (ref 26.0–34.0)
MCHC: 30.7 g/dL (ref 30.0–36.0)
MCV: 83.3 fL (ref 78.0–100.0)
MONOS PCT: 9 % (ref 3–12)
Monocytes Absolute: 1.1 10*3/uL — ABNORMAL HIGH (ref 0.1–1.0)
Neutro Abs: 8.8 10*3/uL — ABNORMAL HIGH (ref 1.7–7.7)
Neutrophils Relative %: 73 % (ref 43–77)
PLATELETS: 224 10*3/uL (ref 150–400)
RBC: 5.28 MIL/uL — AB (ref 3.87–5.11)
RDW: 19.1 % — ABNORMAL HIGH (ref 11.5–15.5)
WBC: 12.2 10*3/uL — AB (ref 4.0–10.5)

## 2013-11-27 LAB — TSH: TSH: 2.803 u[IU]/mL (ref 0.350–4.500)

## 2013-11-27 LAB — PRO B NATRIURETIC PEPTIDE: Pro B Natriuretic peptide (BNP): 916.9 pg/mL — ABNORMAL HIGH (ref 0–125)

## 2013-11-27 LAB — MRSA PCR SCREENING: MRSA by PCR: NEGATIVE

## 2013-11-27 LAB — TROPONIN I

## 2013-11-27 MED ORDER — ATORVASTATIN CALCIUM 20 MG PO TABS
20.0000 mg | ORAL_TABLET | Freq: Every day | ORAL | Status: DC
  Administered 2013-11-27 – 2013-11-29 (×3): 20 mg via ORAL
  Filled 2013-11-27 (×3): qty 1

## 2013-11-27 MED ORDER — ACETAMINOPHEN 325 MG PO TABS: 650.0000 mg | ORAL_TABLET | Freq: Four times a day (QID) | ORAL | Status: DC | PRN

## 2013-11-27 MED ORDER — IPRATROPIUM-ALBUTEROL 0.5-2.5 (3) MG/3ML IN SOLN
3.0000 mL | RESPIRATORY_TRACT | Status: DC
  Administered 2013-11-27 – 2013-11-30 (×18): 3 mL via RESPIRATORY_TRACT
  Filled 2013-11-27 (×19): qty 3

## 2013-11-27 MED ORDER — LEVOFLOXACIN IN D5W 750 MG/150ML IV SOLN: 750.0000 mg | INTRAVENOUS | Status: DC

## 2013-11-27 MED ORDER — SODIUM CHLORIDE 0.9 % IV BOLUS (SEPSIS): 1000.0000 mL | Freq: Once | INTRAVENOUS | Status: DC

## 2013-11-27 MED ORDER — DILTIAZEM HCL ER COATED BEADS 240 MG PO CP24
240.0000 mg | ORAL_CAPSULE | Freq: Every day | ORAL | Status: DC
  Administered 2013-11-28 – 2013-11-30 (×3): 240 mg via ORAL
  Filled 2013-11-27 (×3): qty 1

## 2013-11-27 MED ORDER — APIXABAN 5 MG PO TABS
5.0000 mg | ORAL_TABLET | Freq: Two times a day (BID) | ORAL | Status: DC
  Administered 2013-11-27 – 2013-11-30 (×7): 5 mg via ORAL
  Filled 2013-11-27 (×9): qty 1

## 2013-11-27 MED ORDER — APIXABAN 5 MG PO TABS
5.0000 mg | ORAL_TABLET | Freq: Two times a day (BID) | ORAL | Status: DC
  Filled 2013-11-27 (×4): qty 1

## 2013-11-27 MED ORDER — HYDROCODONE-ACETAMINOPHEN 5-325 MG PO TABS
1.0000 | ORAL_TABLET | Freq: Two times a day (BID) | ORAL | Status: DC | PRN
  Administered 2013-11-28 – 2013-11-29 (×2): 1 via ORAL
  Filled 2013-11-27 (×2): qty 1

## 2013-11-27 MED ORDER — METHYLPREDNISOLONE SODIUM SUCC 125 MG IJ SOLR
125.0000 mg | Freq: Once | INTRAMUSCULAR | Status: AC
  Administered 2013-11-27: 125 mg via INTRAVENOUS
  Filled 2013-11-27: qty 2

## 2013-11-27 MED ORDER — SODIUM CHLORIDE 0.9 % IV BOLUS (SEPSIS)
500.0000 mL | Freq: Once | INTRAVENOUS | Status: AC
  Administered 2013-11-27: 08:00:00 via INTRAVENOUS

## 2013-11-27 MED ORDER — SIMVASTATIN 20 MG PO TABS: 40.0000 mg | ORAL_TABLET | Freq: Every day | ORAL | Status: DC

## 2013-11-27 MED ORDER — VITAMIN C 500 MG PO TABS
1000.0000 mg | ORAL_TABLET | Freq: Every day | ORAL | Status: DC
  Administered 2013-11-28 – 2013-11-30 (×3): 1000 mg via ORAL
  Filled 2013-11-27 (×3): qty 2

## 2013-11-27 MED ORDER — GABAPENTIN 100 MG PO CAPS
100.0000 mg | ORAL_CAPSULE | Freq: Three times a day (TID) | ORAL | Status: DC
  Administered 2013-11-27 – 2013-11-30 (×8): 100 mg via ORAL
  Filled 2013-11-27 (×8): qty 1

## 2013-11-27 MED ORDER — SODIUM CHLORIDE 0.9 % IJ SOLN
3.0000 mL | Freq: Two times a day (BID) | INTRAMUSCULAR | Status: DC
  Administered 2013-11-27 – 2013-11-29 (×4): 3 mL via INTRAVENOUS

## 2013-11-27 MED ORDER — ALBUTEROL SULFATE (2.5 MG/3ML) 0.083% IN NEBU
5.0000 mg | INHALATION_SOLUTION | Freq: Once | RESPIRATORY_TRACT | Status: AC
  Administered 2013-11-27: 5 mg via RESPIRATORY_TRACT
  Filled 2013-11-27: qty 6

## 2013-11-27 MED ORDER — IPRATROPIUM BROMIDE 0.02 % IN SOLN
0.5000 mg | Freq: Once | RESPIRATORY_TRACT | Status: AC
  Administered 2013-11-27: 0.5 mg via RESPIRATORY_TRACT
  Filled 2013-11-27: qty 2.5

## 2013-11-27 MED ORDER — BUDESONIDE-FORMOTEROL FUMARATE 160-4.5 MCG/ACT IN AERO
INHALATION_SPRAY | RESPIRATORY_TRACT | Status: AC
  Filled 2013-11-27: qty 6

## 2013-11-27 MED ORDER — SODIUM CHLORIDE 0.9 % IV SOLN: INTRAVENOUS | Status: AC

## 2013-11-27 MED ORDER — ACETAMINOPHEN 650 MG RE SUPP: 650.0000 mg | Freq: Four times a day (QID) | RECTAL | Status: DC | PRN

## 2013-11-27 MED ORDER — LEVOTHYROXINE SODIUM 112 MCG PO TABS
112.0000 ug | ORAL_TABLET | Freq: Every day | ORAL | Status: DC
  Administered 2013-11-28 – 2013-11-30 (×3): 112 ug via ORAL
  Filled 2013-11-27 (×4): qty 1

## 2013-11-27 MED ORDER — PANTOPRAZOLE SODIUM 40 MG PO TBEC
40.0000 mg | DELAYED_RELEASE_TABLET | Freq: Every day | ORAL | Status: DC
  Administered 2013-11-28 – 2013-11-30 (×3): 40 mg via ORAL
  Filled 2013-11-27 (×3): qty 1

## 2013-11-27 MED ORDER — LEVOFLOXACIN IN D5W 750 MG/150ML IV SOLN
750.0000 mg | Freq: Once | INTRAVENOUS | Status: AC
  Administered 2013-11-27: 750 mg via INTRAVENOUS
  Filled 2013-11-27: qty 150

## 2013-11-27 MED ORDER — LEVOFLOXACIN IN D5W 750 MG/150ML IV SOLN
750.0000 mg | INTRAVENOUS | Status: DC
  Administered 2013-11-28 – 2013-11-30 (×3): 750 mg via INTRAVENOUS
  Filled 2013-11-27 (×3): qty 150

## 2013-11-27 MED ORDER — IPRATROPIUM BROMIDE 0.02 % IN SOLN: 0.5000 mg | Freq: Four times a day (QID) | RESPIRATORY_TRACT | Status: DC

## 2013-11-27 MED ORDER — ONDANSETRON HCL 4 MG PO TABS: 4.0000 mg | ORAL_TABLET | Freq: Four times a day (QID) | ORAL | Status: DC | PRN

## 2013-11-27 MED ORDER — BISACODYL 5 MG PO TBEC
5.0000 mg | DELAYED_RELEASE_TABLET | Freq: Every day | ORAL | Status: DC
  Administered 2013-11-27 – 2013-11-30 (×3): 5 mg via ORAL
  Filled 2013-11-27 (×2): qty 1

## 2013-11-27 MED ORDER — POLYETHYLENE GLYCOL 3350 17 G PO PACK: 17.0000 g | PACK | Freq: Every day | ORAL | Status: DC | PRN

## 2013-11-27 MED ORDER — BUDESONIDE-FORMOTEROL FUMARATE 160-4.5 MCG/ACT IN AERO
2.0000 | INHALATION_SPRAY | Freq: Two times a day (BID) | RESPIRATORY_TRACT | Status: DC
  Administered 2013-11-27 – 2013-11-30 (×6): 2 via RESPIRATORY_TRACT
  Filled 2013-11-27: qty 6

## 2013-11-27 MED ORDER — FUROSEMIDE 10 MG/ML IJ SOLN
40.0000 mg | Freq: Every day | INTRAMUSCULAR | Status: DC
  Administered 2013-11-27 – 2013-11-30 (×4): 40 mg via INTRAVENOUS
  Filled 2013-11-27 (×4): qty 4

## 2013-11-27 MED ORDER — METHYLPREDNISOLONE SODIUM SUCC 125 MG IJ SOLR
60.0000 mg | Freq: Two times a day (BID) | INTRAMUSCULAR | Status: DC
  Administered 2013-11-27: 60 mg via INTRAVENOUS
  Filled 2013-11-27: qty 2

## 2013-11-27 MED ORDER — ONDANSETRON HCL 4 MG/2ML IJ SOLN
4.0000 mg | Freq: Four times a day (QID) | INTRAMUSCULAR | Status: DC | PRN
Start: 2013-11-27 — End: 2013-11-30

## 2013-11-27 MED ORDER — LEVOFLOXACIN IN D5W 750 MG/150ML IV SOLN
INTRAVENOUS | Status: AC
  Filled 2013-11-27: qty 150

## 2013-11-27 MED ORDER — ALPRAZOLAM 0.5 MG PO TABS
0.5000 mg | ORAL_TABLET | Freq: Every day | ORAL | Status: DC
  Administered 2013-11-27 – 2013-11-29 (×3): 0.5 mg via ORAL
  Filled 2013-11-27 (×3): qty 1

## 2013-11-27 MED ORDER — INSULIN ASPART 100 UNIT/ML ~~LOC~~ SOLN
0.0000 [IU] | Freq: Three times a day (TID) | SUBCUTANEOUS | Status: DC
  Administered 2013-11-28 (×3): 2 [IU] via SUBCUTANEOUS
  Administered 2013-11-29: 1 [IU] via SUBCUTANEOUS
  Administered 2013-11-29 (×2): 2 [IU] via SUBCUTANEOUS
  Administered 2013-11-30: 3 [IU] via SUBCUTANEOUS

## 2013-11-27 MED ORDER — APIXABAN 5 MG PO TABS
ORAL_TABLET | ORAL | Status: AC
  Filled 2013-11-27: qty 1

## 2013-11-27 MED ORDER — GUAIFENESIN-DM 100-10 MG/5ML PO SYRP: 5.0000 mL | ORAL_SOLUTION | ORAL | Status: DC | PRN

## 2013-11-27 NOTE — ED Notes (Signed)
CRITICAL VALUE ALERT  Critical value received:  Blood gas PH 7.30, PCo2 62.4, PO2 66.7,Sat 89.2  Date of notification:  11/27/2013  Time of notification:  1501  Critical value read back:yes  Nurse who received alert:  Domenica Reamer RN   MD notified (1st page): Dr Candiss Norse  Time of first page:  1502  MD notified (2nd page):  Time of second page:  Responding MD:  Dr Candiss Norse  Time MD responded:  [phone]

## 2013-11-27 NOTE — ED Notes (Signed)
RT at bedside.

## 2013-11-27 NOTE — ED Notes (Signed)
Patient states that she feels better after receiving the breathing treatment. Will continue to monitor patient.

## 2013-11-27 NOTE — ED Notes (Signed)
Hospitalist at bedside 

## 2013-11-27 NOTE — ED Notes (Signed)
Patient c/o mid-sternal chest pain that started x1 week ago and radiates into back. Per patient has asthma and has been wheezing. Patient reports a productive cough with thick white-yellow sputum. Patient denies any fevers.

## 2013-11-27 NOTE — ED Notes (Signed)
CRITICAL VALUE ALERT  Critical value received:  PCO2  Date of notification:  11/27/13  Time of notification:  0925  Critical value read back:yes  Nurse who received alert:  Jeanice Lim, RN  MD notified (1st page):  Dr Candiss Norse  Time of first page: [phone]

## 2013-11-27 NOTE — ED Provider Notes (Signed)
CSN: [id number]     Arrival date & time 11/27/13  0650 History  This chart was scribed for Maudry Diego, MD by Roxan Diesel, ED scribe.  This patient was seen in room APA06/APA06 and the patient's care was started at 7:18 AM.   Chief Complaint  Patient presents with  . Chest Pain  . Asthma    Patient is a 58 y.o. female presenting with chest pain. The history is provided by the patient. No language interpreter was used.  Chest Pain Chest pain location: mid-sternal. Pain quality: sharp   Pain radiates to the back: yes   Pain severity:  Moderate Duration:  1 week Chronicity:  New Associated symptoms: cough and shortness of breath   Associated symptoms: no back pain and no fatigue   Risk factors: diabetes mellitus, high cholesterol, hypertension and smoking     HPI Comments: Joanna Perez is a 58 y.o. female with h/o COPD, asthma, MI, A-fib, DM, HTN and hyperlipidemia who presents to the Emergency Department complaining of one week of sharp mid-sternal chest pain radiating through into back.  Pt also reports a cough productive of greenish and white sputum.  She has h/o asthma and has had increased SOB and wheezing recently.  She denies fever.  Pt is on 3.5-L oxygen at home and uses nebulizer treatments.  PCP is Glo Herring., MD   Past Medical History  Diagnosis Date  . Asthma   . COPD (chronic obstructive pulmonary disease)   . Hypothyroidism   . Essential hypertension, benign   . Hyperlipidemia   . Type 2 diabetes mellitus   . Atrial fibrillation   . MI (myocardial infarction)     2012  . Pericardial effusion 09/09/2013    Past Surgical History  Procedure Laterality Date  . Hernia repair    . Abdominal hysterectomy    . Stomach surgery      Wound vac currently in place  . Esophagogastroduodenoscopy  11/11/2011    Procedure: ESOPHAGOGASTRODUODENOSCOPY (EGD);  Surgeon: Rogene Houston, MD;  Location: [facility];  Service: Endoscopy;  Laterality: N/A;  .  Foreign body removal  11/11/2011    Procedure: FOREIGN BODY REMOVAL;  Surgeon: Rogene Houston, MD;  Location: [facility];  Service: Endoscopy;  Laterality: N/A;  . Tracheal surgery    . Abdominal surgery      Family History  Problem Relation Age of Onset  . Diabetes Mother     History  Substance Use Topics  . Smoking status: Current Some Day Smoker -- 1.50 packs/day for 35 years    Types: Cigarettes  . Smokeless tobacco: Never Used  . Alcohol Use: Yes     Comment: 1-2 beers nightly    OB History   Grav Para Term Preterm Abortions TAB SAB Ect Mult Living   2 2 2       1       Review of Systems  Constitutional: Negative for appetite change and fatigue.  HENT: Negative for congestion, ear discharge and sinus pressure.   Eyes: Negative for discharge.  Respiratory: Positive for cough, shortness of breath and wheezing.   Cardiovascular: Positive for chest pain.  Gastrointestinal: Negative for diarrhea.  Genitourinary: Negative for frequency and hematuria.  Musculoskeletal: Negative for back pain.  Skin: Negative for rash.  Neurological: Negative for seizures.  Psychiatric/Behavioral: Negative for hallucinations.     Allergies  Amoxicillin; Bactrim; Erythromycin; Keflex; and Penicillins  Home Medications   Current Outpatient Rx  Name  Route  Sig  Dispense  Refill  . albuterol (PROVENTIL HFA) 108 (90 BASE) MCG/ACT inhaler      1 puff every 4 (four) hours as needed.          Marland Kitchen albuterol-ipratropium (COMBIVENT) 18-103 MCG/ACT inhaler   Inhalation   Inhale 2 puffs into the lungs every 6 (six) hours as needed for wheezing or shortness of breath.         . ALPRAZolam (XANAX) 1 MG tablet   Oral   Take 1 mg by mouth 3 (three) times daily.          Marland Kitchen apixaban (ELIQUIS) 5 MG TABS tablet   Oral   Take 1 tablet (5 mg total) by mouth 2 (two) times daily.   60 tablet   6   . Ascorbic Acid (VITAMIN C) 1000 MG tablet   Oral   Take 1,000 mg by mouth daily.           . bisacodyl (STIMULANT LAXATIVE) 5 MG EC tablet   Oral   Take 5 mg by mouth daily as needed.          . budesonide-formoterol (SYMBICORT) 160-4.5 MCG/ACT inhaler   Inhalation   Inhale 2 puffs into the lungs.          . budesonide-formoterol (SYMBICORT) 80-4.5 MCG/ACT inhaler   Inhalation   Inhale 2 puffs into the lungs 2 (two) times daily.           . citalopram (CELEXA) 10 MG tablet   Oral   Take 10 mg by mouth daily.           . cyclobenzaprine (FLEXERIL) 5 MG tablet   Oral   Take 5 mg by mouth 3 (three) times daily as needed.          . diltiazem (CARDIZEM CD) 240 MG 24 hr capsule   Oral   Take 1 capsule (240 mg total) by mouth daily.   30 capsule   6   . fluticasone (FLONASE) 50 MCG/ACT nasal spray   Each Nare   Place 1 spray into both nostrils.          . furosemide (LASIX) 20 MG tablet   Oral   Take 40 mg by mouth 2 (two) times daily.          Marland Kitchen gabapentin (NEURONTIN) 100 MG capsule   Oral   Take 100 mg by mouth 3 (three) times daily.          Marland Kitchen guaiFENesin (MUCINEX) 600 MG 12 hr tablet   Oral   Take 1,200 mg by mouth.          Marland Kitchen HYDROcodone-acetaminophen (NORCO) 10-325 MG per tablet   Oral   Take 1 tablet by mouth every 6 (six) hours as needed.          Marland Kitchen levothyroxine (SYNTHROID, LEVOTHROID) 112 MCG tablet   Oral   Take 112 mcg by mouth daily before breakfast.          . oxyCODONE (ROXICODONE) 5 MG immediate release tablet      5 mg.         . pantoprazole (PROTONIX) 40 MG tablet   Oral   Take 40 mg by mouth daily.          . predniSONE (DELTASONE) 10 MG tablet      10/22-10/24: Take 40 mg by mouth with food daily. 10/25-10/27: Take 20 mg by mouth daily with food. 10/28-10/30: Take 10 mg  by mouth daily with food then stop.   21 tablet   0   . promethazine (PHENERGAN) 12.5 MG tablet      12.5 mg.         . ramipril (ALTACE) 10 MG capsule      10 mg.         . ramipril (ALTACE) 5 MG capsule   Oral    Take 5 mg by mouth daily.          . simvastatin (ZOCOR) 40 MG tablet   Oral   Take 40 mg by mouth daily at 6 PM.          . theophylline (THEO-24) 200 MG 24 hr capsule      400 mg 2 (two) times daily.          Marland Kitchen zolpidem (AMBIEN) 5 MG tablet      5 mg at bedtime.           BP 95/40  Pulse 100  Temp(Src) 98.1 F (36.7 C) (Oral)  Resp 23  Ht 5\' 4"  (1.626 m)  Wt 204 lb (92.534 kg)  BMI 35.00 kg/m2  SpO2 81%  Physical Exam  Constitutional: She is oriented to person, place, and time. She appears well-developed.  HENT:  Head: Normocephalic.  Eyes: Conjunctivae and EOM are normal. No scleral icterus.  Neck: Neck supple. No thyromegaly present.  Cardiovascular: Normal rate and regular rhythm.  Exam reveals no gallop and no friction rub.   No murmur heard. Pulmonary/Chest: No stridor. She has wheezes (moderate, bilaterally). She has no rales. She exhibits no tenderness.  Abdominal: She exhibits no distension. There is no tenderness. There is no rebound.  Musculoskeletal: Normal range of motion. She exhibits no edema.  Lymphadenopathy:    She has no cervical adenopathy.  Neurological: She is oriented to person, place, and time. She exhibits normal muscle tone. Coordination normal.  Skin: No rash noted. No erythema.  Psychiatric: She has a normal mood and affect. Her behavior is normal.    ED Course  Procedures (including critical care time)  DIAGNOSTIC STUDIES: Oxygen Saturation is 81% on [place], low by my interpretation.    COORDINATION OF CARE: 7:20 AM-Discussed treatment plan which includes breathing treatment, EKG, CXR and labs with pt at bedside and pt agreed to plan.     Labs Review Labs Reviewed  CBC WITH DIFFERENTIAL - Abnormal; Notable for the following:    WBC 12.2 (*)    RBC 5.28 (*)    MCH 25.6 (*)    RDW 19.1 (*)    Neutro Abs 8.8 (*)    Monocytes Absolute 1.1 (*)    All other components within normal limits  COMPREHENSIVE METABOLIC PANEL -  Abnormal; Notable for the following:    Glucose, Bld 132 (*)    Albumin 3.1 (*)    All other components within normal limits  PRO B NATRIURETIC PEPTIDE - Abnormal; Notable for the following:    Pro B Natriuretic peptide (BNP) 916.9 (*)    All other components within normal limits  TROPONIN I    Imaging Review Dg Chest Portable 1 View  11/27/2013   CLINICAL DATA:  Midsternal chest pain.  EXAM: PORTABLE CHEST - 1 VIEW  COMPARISON:  Chest x-ray 11/03/2013.  FINDINGS: Lung volumes are slightly low. Film is underpenetrated, reducing the diagnostic sensitivity and specificity of the examination. With these limitations in mind, there are some bibasilar opacities (left greater than right), favored to reflect subsegmental atelectasis.  Underlying airspace consolidation in the left lower lobe is difficult to entirely exclude on this limited examination. In addition, there may be a small left pleural effusion, however, this is favored to be technique related. Cephalization of the pulmonary vasculature, without frank pulmonary edema. Heart size appears borderline enlarged, likely accentuated by patient's rotation to the left which also distorts upper mediastinal contours. Atherosclerosis in the thoracic aorta.  IMPRESSION: 1. Borderline cardiomegaly with pulmonary venous congestion, but no frank pulmonary edema. 2. Probable bibasilar subsegmental atelectasis. The possibility of airspace consolidation in the left lower lobe and/or a small left pleural effusion is not excluded. 3. Atherosclerosis.   Electronically Signed   By: Vinnie Langton M.D.   On: 11/27/2013 07:36    EKG Interpretation   None       MDM  Copd          The chart was scribed for me under my direct supervision.  I personally performed the history, physical, and medical decision making and all procedures in the evaluation of this patient.Maudry Diego, MD 11/27/13 [phone]

## 2013-11-27 NOTE — H&P (Signed)
[facility]                                                                                    Patient Demographics  Joanna Perez, is a 58 y.o. female  MRN: [id number]   DOB - 1956-09-10  Admit Date - 11/27/2013  Outpatient Primary MD for the patient is Glo Herring., MD   With History of -  Past Medical History  Diagnosis Date  . Asthma   . COPD (chronic obstructive pulmonary disease)   . Hypothyroidism   . Essential hypertension, benign   . Hyperlipidemia   . Type 2 diabetes mellitus   . Atrial fibrillation   . MI (myocardial infarction)     2012  . Pericardial effusion 09/09/2013      Past Surgical History  Procedure Laterality Date  . Hernia repair    . Abdominal hysterectomy    . Stomach surgery      Wound vac currently in place  . Esophagogastroduodenoscopy  11/11/2011    Procedure: ESOPHAGOGASTRODUODENOSCOPY (EGD);  Surgeon: Rogene Houston, MD;  Location: [facility];  Service: Endoscopy;  Laterality: N/A;  . Foreign body removal  11/11/2011    Procedure: FOREIGN BODY REMOVAL;  Surgeon: Rogene Houston, MD;  Location: [facility];  Service: Endoscopy;  Laterality: N/A;  . Tracheal surgery    . Abdominal surgery      in for   Chief Complaint  Patient presents with  . Chest Pain  . Asthma     HPI  Joanna Perez  is a 58 y.o. female, with history of COPD on 2-3 L nasal 10 oxygen at home, ongoing cigarette smoker, chronic diastolic CHF, atrial fibrillation, diabetes mellitus type 2, chronic pain and anxiety on narcotics and multiple sedative medications, has been brought in by her husband because of a productive cough, some increased shortness of breath and increased sleepiness for the last few days. No fever chills, no chest pain or palpitations, no body aches which are new, she does have chronic back and abdominal pain, brought in to the ER where workup was consistent with COPD exacerbation along with the possibility of community-acquired  pneumonia, I was called to admit the patient for the same.    Review of Systems    In addition to the HPI above,   No Fever-chills, No Headache, No changes with Vision or hearing, No problems swallowing food or Liquids, No Chest pain, positive productive Cough and some Shortness of Breath, No Abdominal pain, No Nausea or Vommitting, Bowel movements are regular, No Blood in stool or Urine, No dysuria, No new skin rashes or bruises, No new joints pains-aches,  No new weakness, tingling, numbness in any extremity, chronic back pain and abdominal pain No recent weight gain or loss, No polyuria, polydypsia or polyphagia, No significant Mental Stressors.  A full 10 point Review of Systems was done, except as stated above, all other Review of Systems were negative.   Social History History  Substance Use Topics  . Smoking status: Current Some Day Smoker -- 1.50 packs/day for 35 years    Types: Cigarettes  . Smokeless tobacco: Never Used  . Alcohol  Use: Yes     Comment: 1-2 beers nightly      Family History Family History  Problem Relation Age of Onset  . Diabetes Mother       Prior to Admission medications   Medication Sig Start Date End Date Taking? Authorizing Provider  albuterol (PROVENTIL HFA) 108 (90 BASE) MCG/ACT inhaler 1 puff every 4 (four) hours as needed.  11/11/12   Historical Provider, MD  albuterol-ipratropium (COMBIVENT) 18-103 MCG/ACT inhaler Inhale 2 puffs into the lungs every 6 (six) hours as needed for wheezing or shortness of breath.    Historical Provider, MD  ALPRAZolam Duanne Moron) 1 MG tablet Take 1 mg by mouth 3 (three) times daily.     Historical Provider, MD  apixaban (ELIQUIS) 5 MG TABS tablet Take 1 tablet (5 mg total) by mouth 2 (two) times daily. 09/29/13   Lendon Colonel, NP  Ascorbic Acid (VITAMIN C) 1000 MG tablet Take 1,000 mg by mouth daily.     Historical Provider, MD  bisacodyl (STIMULANT LAXATIVE) 5 MG EC tablet Take 5 mg by mouth daily as  needed.     Historical Provider, MD  budesonide-formoterol (SYMBICORT) 160-4.5 MCG/ACT inhaler Inhale 2 puffs into the lungs.     Historical Provider, MD  budesonide-formoterol (SYMBICORT) 80-4.5 MCG/ACT inhaler Inhale 2 puffs into the lungs 2 (two) times daily.      Historical Provider, MD  citalopram (CELEXA) 10 MG tablet Take 10 mg by mouth daily.      Historical Provider, MD  cyclobenzaprine (FLEXERIL) 5 MG tablet Take 5 mg by mouth 3 (three) times daily as needed.  02/18/12   Historical Provider, MD  diltiazem (CARDIZEM CD) 240 MG 24 hr capsule Take 1 capsule (240 mg total) by mouth daily. 09/29/13   Lendon Colonel, NP  fluticasone (FLONASE) 50 MCG/ACT nasal spray Place 1 spray into both nostrils.     Historical Provider, MD  furosemide (LASIX) 20 MG tablet Take 40 mg by mouth 2 (two) times daily.     Historical Provider, MD  gabapentin (NEURONTIN) 100 MG capsule Take 100 mg by mouth 3 (three) times daily.  08/15/13 08/15/14  Historical Provider, MD  guaiFENesin (MUCINEX) 600 MG 12 hr tablet Take 1,200 mg by mouth.     Historical Provider, MD  HYDROcodone-acetaminophen (NORCO) 10-325 MG per tablet Take 1 tablet by mouth every 6 (six) hours as needed.     Historical Provider, MD  levothyroxine (SYNTHROID, LEVOTHROID) 112 MCG tablet Take 112 mcg by mouth daily before breakfast.     Historical Provider, MD  oxyCODONE (ROXICODONE) 5 MG immediate release tablet 5 mg. 06/09/13   Historical Provider, MD  pantoprazole (PROTONIX) 40 MG tablet Take 40 mg by mouth daily.     Historical Provider, MD  predniSONE (DELTASONE) 10 MG tablet 10/22-10/24: Take 40 mg by mouth with food daily. 10/25-10/27: Take 20 mg by mouth daily with food. 10/28-10/30: Take 10 mg by mouth daily with food then stop. 09/12/13   Samuella Cota, MD  promethazine (PHENERGAN) 12.5 MG tablet 12.5 mg. 04/04/12   Historical Provider, MD  ramipril (ALTACE) 10 MG capsule 10 mg.    Historical Provider, MD  ramipril (ALTACE) 5 MG capsule  Take 5 mg by mouth daily.     Historical Provider, MD  simvastatin (ZOCOR) 40 MG tablet Take 40 mg by mouth daily at 6 PM.     Historical Provider, MD  theophylline (THEO-24) 200 MG 24 hr capsule 400 mg 2 (two) times  daily.     Historical Provider, MD  zolpidem (AMBIEN) 5 MG tablet 5 mg at bedtime.     Historical Provider, MD    Allergies  Allergen Reactions  . Amoxicillin Hives  . Bactrim [Sulfamethoxazole-Tmp Ds] Hives  . Erythromycin Hives  . Keflex [Cephalexin] Hives  . Penicillins Itching and Swelling    Sweating    Physical Exam  Vitals  Blood pressure 110/55, pulse 92, temperature 98.1 F (36.7 C), temperature source Oral, resp. rate 22, height 5\' 4"  (1.626 m), weight 92.534 kg (204 lb), SpO2 92.00%.   1. General middle aged white obese female mildly somnolent but answers questions and follows commands lying in bed in NAD,    2. Normal affect and insight, Not Suicidal or Homicidal, Awake Alert, Oriented X 3.  3. No F.N deficits, ALL C.Nerves Intact, Strength 5/5 all 4 extremities, Sensation intact all 4 extremities, Plantars down going.  4. Ears and Eyes appear Normal, Conjunctivae clear, PERRLA. Moist Oral Mucosa.  5. Supple Neck, No JVD, No cervical lymphadenopathy appriciated, No Carotid Bruits.  6. Symmetrical Chest wall movement, Good air movement bilaterally, few crackles in some wheezing  7. RRR, No Gallops, Rubs or Murmurs, No Parasternal Heave.  8. Positive Bowel Sounds, Abdomen Soft, Non tender, No organomegaly appriciated,No rebound -guarding or rigidity.  9.  No Cyanosis, Normal Skin Turgor, No Skin Rash or Bruise.  10. Good muscle tone,  joints appear normal , no effusions, Normal ROM.  11. No Palpable Lymph Nodes in Neck or Axillae     Data Review  CBC  Recent Labs Lab 11/27/13 0717  WBC 12.2*  HGB 13.5  HCT 44.0  PLT 224  MCV 83.3  MCH 25.6*  MCHC 30.7  RDW 19.1*  LYMPHSABS 2.1  MONOABS 1.1*  EOSABS 0.1  BASOSABS 0.0    ------------------------------------------------------------------------------------------------------------------  Chemistries   Recent Labs Lab 11/27/13 0717  NA 140  K 4.0  CL 99  CO2 30  GLUCOSE 132*  BUN 12  CREATININE 0.63  CALCIUM 9.5  AST 9  ALT 8  ALKPHOS 64  BILITOT 0.6   ------------------------------------------------------------------------------------------------------------------ estimated creatinine clearance is 85.5 ml/min (by C-G formula based on Cr of 0.63). ------------------------------------------------------------------------------------------------------------------ No results found for this basename: TSH, T4TOTAL, FREET3, T3FREE, THYROIDAB,  in the last 72 hours   Coagulation profile No results found for this basename: INR, PROTIME,  in the last 168 hours ------------------------------------------------------------------------------------------------------------------- No results found for this basename: DDIMER,  in the last 72 hours -------------------------------------------------------------------------------------------------------------------  Cardiac Enzymes  Recent Labs Lab 11/27/13 0717  TROPONINI <0.30   ------------------------------------------------------------------------------------------------------------------ No components found with this basename: POCBNP,    ---------------------------------------------------------------------------------------------------------------  Urinalysis    Component Value Date/Time   COLORURINE STRAW* 09/08/2013 [place] 09/08/2013 1205   LABSPEC 1.010 09/08/2013 1205   PHURINE 6.0 09/08/2013 [place] 09/08/2013 [place] 09/08/2013 [place] 09/08/2013 [place] 09/08/2013 [place] 09/08/2013 1205   UROBILINOGEN 0.2 09/08/2013 1205   NITRITE NEGATIVE 09/08/2013 [place]  09/08/2013 1205    ----------------------------------------------------------------------------------------------------------------  Imaging results:   Dg Chest 2 View  11/03/2013   CLINICAL DATA:  Cough and congestion.  EXAM: CHEST  2 VIEW  COMPARISON:  09/08/2013  FINDINGS: Two views of the chest demonstrate clear lungs. Heart and mediastinum are stable. The trachea is midline and there is kyphosis in the thoracic spine. Prominent lung markings appear  to be chronic.  IMPRESSION: No acute chest abnormalities.   Electronically Signed   By: Markus Daft M.D.   On: 11/03/2013 15:55   Dg Chest Portable 1 View  11/27/2013   CLINICAL DATA:  Midsternal chest pain.  EXAM: PORTABLE CHEST - 1 VIEW  COMPARISON:  Chest x-ray 11/03/2013.  FINDINGS: Lung volumes are slightly low. Film is underpenetrated, reducing the diagnostic sensitivity and specificity of the examination. With these limitations in mind, there are some bibasilar opacities (left greater than right), favored to reflect subsegmental atelectasis. Underlying airspace consolidation in the left lower lobe is difficult to entirely exclude on this limited examination. In addition, there may be a small left pleural effusion, however, this is favored to be technique related. Cephalization of the pulmonary vasculature, without frank pulmonary edema. Heart size appears borderline enlarged, likely accentuated by patient's rotation to the left which also distorts upper mediastinal contours. Atherosclerosis in the thoracic aorta.  IMPRESSION: 1. Borderline cardiomegaly with pulmonary venous congestion, but no frank pulmonary edema. 2. Probable bibasilar subsegmental atelectasis. The possibility of airspace consolidation in the left lower lobe and/or a small left pleural effusion is not excluded. 3. Atherosclerosis.   Electronically Signed   By: Vinnie Langton M.D.   On: 11/27/2013 07:36    My personal review of EKG: Rhythm S.Tach 104 , no Acute ST  changes  Echo 08-2013  Study Conclusions  - Study data: M-mode, limited 2D, limited spectral Doppler, and color Doppler. - Left ventricle: Wall thickness was increased in a pattern of mild LVH. Systolic function was normal. The estimated ejection fraction was in the range of 50% to 55%. Wall motion was normal; there were no regional wall motion abnormalities. Features are consistent with a pseudonormal left ventricular filling pattern, with concomitant abnormal relaxation and increased filling pressure (grade 2 diastolic dysfunction). Doppler parameters are consistent with high ventricular filling pressure. - Ventricular septum: Septal motion showed abnormal function and dyssynergy. - Aortic valve: Calcified annulus. - Mitral valve: Markedly diminished respiratory variation with transmitral Doppler flow signal when compared to echocardiogram performed on 09/08/2013 (no hemodynamic compromise with present study). Calcified annulus. Mild regurgitation. - Left atrium: The atrium was moderately dilated. - Inferior vena cava: The vessel was dilated. - Pericardium, extracardiac: A trivial pericardial effusion was identified. When compared with the previous study from 09/08/2013, there is a marked reduction in size of the effusion. There is no hemodynamic sequelae identified    Assessment & Plan   1. Acute on chronic respiratory failure secondary to a combination of COPD exacerbation, possible mild CHF compensation along with possible community-acquired pneumonia. She will be admitted to a telemetry bed, since she is somewhat somnolent we'll check ABG and if needed I will place her on BiPAP, oxygen supplementation and nebulizer treatments, place her on Levaquin along with IV Solu-Medrol for now and monitor. Will check sputum Gram stain culture along with Legionella, strep pneumo antigen and rule out influenza.     2. Possible mild acute on chronic diastolic CHF EF preserved around 60% on recent  echo gram. Pro BNP is mildly elevated however this can be elevated even with chronic right-sided strain due to COPD, will place her on IV Lasix for now and monitor clinically.     3. Increased somnolence for the last few days. In the light of her underlying COPD exacerbation, narcotic use, benzo use, Flexeril use, Phenergan - check ABG, if there is evidence of CO2 retention I will place her on BiPAP and monitor.  4. Hypothyroidism. Continue home dose Synthroid check baseline TSH.     5. Diabetes mellitus type 2. For now we'll place her on sliding scale insulin and monitor.   Lab Results  Component Value Date   HGBA1C 6.2* 09/08/2013      6. Atrial fibrillation. Goal will be rate controlled, we'll continue her Cardizem along with Apixaban.     7. Chronic abdominal pain-back pain. We'll cut back on narcotics benzos and Flexeril to minimal possible. Family and patient educated.       DVT Prophylaxis Apixaban  AM Labs Ordered, also please review Full Orders  Family Communication: Admission, patients condition and plan of care including tests being ordered have been discussed with the patient and family who indicate understanding and agree with the plan and Code Status.  Code Status full  Likely DC to  home  Condition GUARDED   Time spent in minutes : 35    SINGH,PRASHANT K M.D on 11/27/2013 at 9:20 AM  Between 7am to 7pm - Pager - [phone]  After 7pm go to [email or web address] - password [email or web address]  And look for the night coverage person covering me after hours  [facility] Office  [phone]

## 2013-11-27 NOTE — Progress Notes (Signed)
ANTICOAGULATION CONSULT NOTE - Initial Consult  Pharmacy Consult for Apixaban Indication: atrial fibrillation  Allergies  Allergen Reactions  . Amoxicillin Hives  . Bactrim [Sulfamethoxazole-Tmp Ds] Hives  . Erythromycin Hives  . Keflex [Cephalexin] Hives  . Penicillins Itching and Swelling    Sweating    Patient Measurements: Height: 5\' 4"  (162.6 cm) Weight: 204 lb (92.534 kg) IBW/kg (Calculated) : 54.7  Vital Signs: Temp: 98.1 F (36.7 C) (01/05 0703) Temp src: Oral (01/05 0703) BP: 99/46 mmHg (01/05 0900) Pulse Rate: 87 (01/05 0900)  Labs:  Recent Labs  11/27/13 0717  HGB 13.5  HCT 44.0  PLT 224  CREATININE 0.63  TROPONINI <0.30   Estimated Creatinine Clearance: 85.5 ml/min (by C-G formula based on Cr of 0.63).  Medical History: Past Medical History  Diagnosis Date  . Asthma   . COPD (chronic obstructive pulmonary disease)   . Hypothyroidism   . Essential hypertension, benign   . Hyperlipidemia   . Type 2 diabetes mellitus   . Atrial fibrillation   . MI (myocardial infarction)     2012  . Pericardial effusion 09/09/2013   Medications:  Scheduled:  . apixaban  5 mg Oral BID  . ipratropium  0.5 mg Nebulization QID    Assessment: 58yo female with nonvalvular atrial fibrillation.  Good renal fxn.  Estimated Creatinine Clearance: 85.5 ml/min (by C-G formula based on Cr of 0.63).  Monitor platelets by anticoagulation protocol: Yes   Plan:  Apixaban 5mg  PO BID  Hart Robinsons A 11/27/2013,10:02 AM

## 2013-11-28 DIAGNOSIS — J9801 Acute bronchospasm: Secondary | ICD-10-CM

## 2013-11-28 LAB — BASIC METABOLIC PANEL
BUN: 15 mg/dL (ref 6–23)
CHLORIDE: 95 meq/L — AB (ref 96–112)
CO2: 31 mEq/L (ref 19–32)
Calcium: 9.9 mg/dL (ref 8.4–10.5)
Creatinine, Ser: 0.53 mg/dL (ref 0.50–1.10)
GFR calc non Af Amer: >90 mL/min (ref >90)
Glucose, Bld: 165 mg/dL — ABNORMAL HIGH (ref 70–99)
POTASSIUM: 4 meq/L (ref 3.7–5.3)
Sodium: 138 mEq/L (ref 137–147)

## 2013-11-28 LAB — BLOOD GAS, ARTERIAL
Acid-Base Excess: 8.8 mmol/L — ABNORMAL HIGH (ref 0.0–2.0)
Bicarbonate: 34.4 mEq/L — ABNORMAL HIGH (ref 20.0–24.0)
DRAWN BY: 38235
Delivery systems: POSITIVE
EXPIRATORY PAP: 8
FIO2: 0.45 %
INSPIRATORY PAP: 12
O2 Saturation: 89.5 %
PATIENT TEMPERATURE: 37
PH ART: 7.359 (ref 7.350–7.450)
PO2 ART: 65.7 mmHg — AB (ref 80.0–100.0)
TCO2: 30.8 mmol/L (ref 0–100)
pCO2 arterial: 62.5 mmHg (ref 35.0–45.0)

## 2013-11-28 LAB — HEMOGLOBIN A1C
Hgb A1c MFr Bld: 6.4 % — ABNORMAL HIGH (ref <5.7)
MEAN PLASMA GLUCOSE: 137 mg/dL — AB (ref <117)

## 2013-11-28 LAB — CBC
HEMATOCRIT: 43.6 % (ref 36.0–46.0)
HEMOGLOBIN: 13.4 g/dL (ref 12.0–15.0)
MCH: 25.3 pg — ABNORMAL LOW (ref 26.0–34.0)
MCHC: 30.7 g/dL (ref 30.0–36.0)
MCV: 82.4 fL (ref 78.0–100.0)
Platelets: 226 10*3/uL (ref 150–400)
RBC: 5.29 MIL/uL — ABNORMAL HIGH (ref 3.87–5.11)
RDW: 18.8 % — ABNORMAL HIGH (ref 11.5–15.5)
WBC: 7.3 10*3/uL (ref 4.0–10.5)

## 2013-11-28 LAB — GLUCOSE, CAPILLARY
Glucose-Capillary: 152 mg/dL — ABNORMAL HIGH (ref 70–99)
Glucose-Capillary: 161 mg/dL — ABNORMAL HIGH (ref 70–99)
Glucose-Capillary: 168 mg/dL — ABNORMAL HIGH (ref 70–99)
Glucose-Capillary: 172 mg/dL — ABNORMAL HIGH (ref 70–99)

## 2013-11-28 LAB — STREP PNEUMONIAE URINARY ANTIGEN: Strep Pneumo Urinary Antigen: NEGATIVE

## 2013-11-28 MED ORDER — APIXABAN 5 MG PO TABS
ORAL_TABLET | ORAL | Status: AC
  Filled 2013-11-28: qty 1

## 2013-11-28 MED ORDER — METHYLPREDNISOLONE SODIUM SUCC 125 MG IJ SOLR
60.0000 mg | Freq: Three times a day (TID) | INTRAMUSCULAR | Status: DC
  Administered 2013-11-28 – 2013-11-30 (×6): 60 mg via INTRAVENOUS
  Filled 2013-11-28 (×6): qty 2

## 2013-11-28 NOTE — Care Management Note (Signed)
    Page 1 of 1   11/28/2013     3:10:18 PM   CARE MANAGEMENT NOTE 11/28/2013  Patient:  Joanna Perez, Joanna Perez   Account Number:  [id number]  Date Initiated:  11/28/2013  Documentation initiated by:  Theophilus Kinds  Subjective/Objective Assessment:   Pt admitted from home with COPD. Pt lives with her husband and will return home at discharge. Pt has home O2, walker, BSC, neb machine, and shower chair. Pt has a daugher in law who sits with her while family is alone.     Action/Plan:   No CM needs noted. Pt refuses any HH at this time.   Anticipated DC Date:  11/30/2013   Anticipated DC Plan:  [place]  CM consult      Choice offered to / List presented to:             Status of service:  Completed, signed off Medicare Important Message given?   (If response is "NO", the following Medicare IM given date fields will be blank) Date Medicare IM given:   Date Additional Medicare IM given:    Discharge Disposition:  HOME/SELF CARE  Per UR Regulation:    If discussed at Long Length of Stay Meetings, dates discussed:    Comments:  11/28/13 [place], RN BSN CM

## 2013-11-28 NOTE — Progress Notes (Signed)
Critical Co2 called 62.5 MD made aware via text page. Result is same as previous value Pt on BiPAP at this time.

## 2013-11-28 NOTE — Progress Notes (Signed)
11/28/12 2240 Patient transferred up from ICU this evening. afib with HR stable in 70s-80s per report. Telemetry orders, no tele boxes available. Notified mid-level provider. Telemetry d/c'd. Donavan Foil, RN

## 2013-11-28 NOTE — Progress Notes (Signed)
Report called to [facility]. Patient transferred to 341 in stable condition via wheelchair by NT.

## 2013-11-28 NOTE — Progress Notes (Signed)
UR chart review completed.  

## 2013-11-28 NOTE — Evaluation (Signed)
Physical Therapy Evaluation Patient Details Name: Joanna Perez MRN: [id number] DOB: Nov 22, 1956 Today's Date: 11/28/2013 Time: 8185-6314 PT Time Calculation (min): 38 min  PT Assessment / Plan / Recommendation History of Present Illness  Pt with multiple medical problems including CHF, Afib, DM and chronic pain/anxiety is admitted with exacerbation of COPD.  She is O2 dependent at home and still smokes per chart .  She lives alone with close family support.  Clinical Impression   Pt was seen for evaluation and found to be close to prior functional baseline.  She was instructed in energy conservation strategies and we emphasized increasing activity intermittently through the day.  She has adequate DME and family support and should be able to transition to home with no other intervention.    PT Assessment  Patent does not need any further PT services    Follow Up Recommendations  No PT follow up    Does the patient have the potential to tolerate intense rehabilitation      Barriers to Discharge        Equipment Recommendations  None recommended by PT    Recommendations for Other Services     Frequency      Precautions / Restrictions Precautions Precautions: None Restrictions Weight Bearing Restrictions: No   Pertinent Vitals/Pain       Mobility  Bed Mobility Bed Mobility: Not assessed Transfers Transfers: Sit to Stand;Stand to Sit Sit to Stand: 6: Modified independent (Device/Increase time);From chair/3-in-1;With upper extremity assist Stand to Sit: 6: Modified independent (Device/Increase time);To chair/3-in-1;With upper extremity assist Ambulation/Gait Ambulation/Gait Assistance: 6: Modified independent (Device/Increase time) Ambulation Distance (Feet): 50 Feet Assistive device: Rolling walker Gait Pattern: Trunk flexed Gait velocity: WNL General Gait Details: O2 sat decreased to 85% with gait but rebounded to 90% within 2 minutes Stairs: No Wheelchair  Mobility Wheelchair Mobility: No    Exercises     PT Diagnosis:    PT Problem List:   PT Treatment Interventions:       PT Goals(Current goals can be found in the care plan section) Acute Rehab PT Goals PT Goal Formulation: No goals set, d/c therapy  Visit Information  Last PT Received On: 11/28/13 History of Present Illness: Pt with multiple medical problems including CHF, Afib, DM and chronic pain/anxiety is admitted with exacerbation of COPD.  She is O2 dependent at home and still smokes per chart .  She lives alone with close family support.       Prior [place] expects to be discharged to:: Private residence Living Arrangements: Alone Available Help at Discharge: Family;Available PRN/intermittently Type of Home: House Home Access: Ramped entrance Home Layout: One level Home Equipment: Walker - 2 wheels;Cane - quad;Bedside commode;Shower seat Prior Function Level of Independence: Independent with assistive device(s) Comments: usually uses a quad cane and "furniture walks" Communication Communication: No difficulties    Cognition  Cognition Arousal/Alertness: Awake/alert Behavior During Therapy: WFL for tasks assessed/performed Overall Cognitive Status: Within Functional Limits for tasks assessed    Extremity/Trunk Assessment Lower Extremity Assessment Lower Extremity Assessment: Overall WFL for tasks assessed Cervical / Trunk Assessment Cervical / Trunk Assessment: Kyphotic   Balance Balance Balance Assessed: No (WNL by functional observation)  End of Session PT - End of Session Equipment Utilized During Treatment: Gait belt Activity Tolerance: Patient tolerated treatment well Patient left: in chair;with call bell/phone within reach Nurse Communication: Mobility status  GP     Sable Feil 11/28/2013, 12:31 PM

## 2013-11-28 NOTE — Progress Notes (Signed)
[facility]                                                                                Patient Demographics  Joanna Perez, is a 58 y.o. female, DOB - 1956/11/01, HWE:[id number]  Admit date - 11/27/2013   Admitting Physician Thurnell Lose, MD  Outpatient Primary MD for the patient is Glo Herring., MD  LOS - 1   Chief Complaint  Patient presents with  . Chest Pain  . Asthma        Assessment & Plan    1. Acute on chronic respiratory failure secondary to a combination of COPD exacerbation - sedation from Meds + high Pco2 along with  possible mild CHF decompensation .  She is much improved with IV steroids, empiric antibiotic, scheduled and when necessary nebulizer treatments along with oxygen support, she required BiPAP support yesterday and now is much alert awake with stable ABGs. We'll transition her to Ventimask and then to 4-5 L nasal cannula oxygen which is her baseline to keep pulse ox greater than 88%. No she uses 4-5 L nasal cannula oxygen at home but pulse ox between 80-90%.     2. Possible mild acute on chronic diastolic CHF EF preserved around 60% on recent echo gram. Continue on daily Lasix for now close to being compensated clinically.      3. Increased somnolence on admission and per family for few days prior to admission also- I think large part of somnolence was caused by polypharmacy, subsequently due to decreased respiratory effort and COPD exacerbation she regained some more carbon dioxide making somnolence worse, this has completely resolved after her narcotics, benzos, Flexeril, Phenergan have been held or reduced, continue present dose, this problem has resolved.      4. Hypothyroidism. Continue home dose Synthroid.  Lab Results  Component Value Date   TSH 2.803 11/27/2013       5. Diabetes mellitus type 2. For now we'll place her on sliding scale insulin and monitor. Repeat A1c.  Lab Results  Component Value Date   HGBA1C 6.2*  09/08/2013    CBG (last 3)   Recent Labs  11/27/13 1821 11/28/13 0827  GLUCAP 151* 152*      6. Atrial fibrillation. Goal will be rate controlled, we'll continue her Cardizem along with Apixaban.      7. Chronic abdominal pain-back pain. We'll cut back on narcotics benzos and Flexeril to minimal possible. Family and patient educated.      8. Dyslipidemia. On home dose statin.      Code Status: full  Family Communication: husband and son  Disposition Plan: home   Procedures  CXR   Consults  none   Medications  Scheduled Meds: . ALPRAZolam  0.5 mg Oral QHS  . apixaban  5 mg Oral BID  . atorvastatin  20 mg Oral q1800  . bisacodyl  5 mg Oral Daily  . budesonide-formoterol  2 puff Inhalation BID  . diltiazem  240 mg Oral Daily  . furosemide  40 mg Intravenous Daily  . gabapentin  100 mg Oral TID  . insulin aspart  0-9 Units Subcutaneous TID WC  . ipratropium-albuterol  3  mL Nebulization Q4H  . levofloxacin (LEVAQUIN) IV  750 mg Intravenous Q24H  . levothyroxine  112 mcg Oral QAC breakfast  . methylPREDNISolone (SOLU-MEDROL) injection  60 mg Intravenous Q8H  . pantoprazole  40 mg Oral Daily  . sodium chloride  3 mL Intravenous Q12H  . vitamin C  1,000 mg Oral Daily   Continuous Infusions:  PRN Meds:.acetaminophen, guaiFENesin-dextromethorphan, HYDROcodone-acetaminophen, ondansetron (ZOFRAN) IV, polyethylene glycol  DVT Prophylaxis  Apixaban  Lab Results  Component Value Date   PLT 226 11/28/2013    Antibiotics     Anti-infectives   Start     Dose/Rate Route Frequency Ordered Stop   11/28/13 0800  levofloxacin (LEVAQUIN) IVPB 750 mg    Comments:  Pharm for CAP   750 mg 100 mL/hr over 90 Minutes Intravenous Every 24 hours 11/27/13 1850     11/27/13 1900  levofloxacin (LEVAQUIN) IVPB 750 mg  Status:  Discontinued     750 mg 100 mL/hr over 90 Minutes Intravenous Every 24 hours 11/27/13 1850 11/27/13 1910   11/27/13 0830  levofloxacin (LEVAQUIN)  IVPB 750 mg     750 mg 100 mL/hr over 90 Minutes Intravenous  Once 11/27/13 0815 11/27/13 0958          Subjective:   Joanna Perez today has, No headache, No chest pain, No abdominal pain - No Nausea, No new weakness tingling or numbness, No Cough - improved SOB.   Objective:   Filed Vitals:   11/28/13 0700 11/28/13 0710 11/28/13 0800 11/28/13 0900  BP: 110/46  116/80 123/54  Pulse: 69  87 91  Temp:      TempSrc:      Resp: 19  20 23   Height:      Weight:      SpO2: 91% 91% 92% 88%    Wt Readings from Last 3 Encounters:  11/28/13 94.1 kg (207 lb 7.3 oz)  11/03/13 95.255 kg (210 lb)  10/27/13 95.255 kg (210 lb)     Intake/Output Summary (Last 24 hours) at 11/28/13 1033 Last data filed at 11/28/13 0500  Gross per 24 hour  Intake      0 ml  Output   2900 ml  Net  -2900 ml    Exam Awake Alert, Oriented X 3, No new F.N deficits, Normal affect [place].AT,PERRAL Supple Neck,No JVD, No cervical lymphadenopathy appriciated.  Symmetrical Chest wall movement, Good air movement bilaterally, mild wheezing RRR,No Gallops,Rubs or new Murmurs, No Parasternal Heave +ve B.Sounds, Abd Soft, Non tender, No organomegaly appriciated, No rebound - guarding or rigidity. No Cyanosis, Clubbing or edema, No new Rash or bruise     Data Review   Micro Results Recent Results (from the past 240 hour(s))  MRSA PCR SCREENING     Status: None   Collection Time    11/27/13  6:51 PM      Result Value Range Status   MRSA by PCR NEGATIVE  NEGATIVE Final   Comment:            The GeneXpert MRSA Assay (FDA     approved for NASAL specimens     only), is one component of a     comprehensive MRSA colonization     surveillance program. It is not     intended to diagnose MRSA     infection nor to guide or     monitor treatment for     MRSA infections.    Radiology Reports Dg Chest 2 View  11/27/2013  CLINICAL DATA:  Shortness of breath, chest pain, wheezing and cough.  EXAM: CHEST - 2 VIEW   COMPARISON:  Film earlier today at 0716 hr  FINDINGS: The heart is enlarged. There remains evidence of interstitial prominence and pulmonary venous prominence likely consistent with mild interstitial edema. Small bilateral pleural effusions are present in the lateral projection. No focal airspace consolidation is identified.  IMPRESSION: Cardiomegaly and probable mild interstitial edema. Small bilateral pleural effusions are present.   Electronically Signed   By: Aletta Edouard M.D.   On: 11/27/2013 09:53   Dg Chest 2 View  11/03/2013   CLINICAL DATA:  Cough and congestion.  EXAM: CHEST  2 VIEW  COMPARISON:  09/08/2013  FINDINGS: Two views of the chest demonstrate clear lungs. Heart and mediastinum are stable. The trachea is midline and there is kyphosis in the thoracic spine. Prominent lung markings appear to be chronic.  IMPRESSION: No acute chest abnormalities.   Electronically Signed   By: Markus Daft M.D.   On: 11/03/2013 15:55   Dg Chest Portable 1 View  11/27/2013   CLINICAL DATA:  Midsternal chest pain.  EXAM: PORTABLE CHEST - 1 VIEW  COMPARISON:  Chest x-ray 11/03/2013.  FINDINGS: Lung volumes are slightly low. Film is underpenetrated, reducing the diagnostic sensitivity and specificity of the examination. With these limitations in mind, there are some bibasilar opacities (left greater than right), favored to reflect subsegmental atelectasis. Underlying airspace consolidation in the left lower lobe is difficult to entirely exclude on this limited examination. In addition, there may be a small left pleural effusion, however, this is favored to be technique related. Cephalization of the pulmonary vasculature, without frank pulmonary edema. Heart size appears borderline enlarged, likely accentuated by patient's rotation to the left which also distorts upper mediastinal contours. Atherosclerosis in the thoracic aorta.  IMPRESSION: 1. Borderline cardiomegaly with pulmonary venous congestion, but no frank  pulmonary edema. 2. Probable bibasilar subsegmental atelectasis. The possibility of airspace consolidation in the left lower lobe and/or a small left pleural effusion is not excluded. 3. Atherosclerosis.   Electronically Signed   By: Vinnie Langton M.D.   On: 11/27/2013 07:36    CBC  Recent Labs Lab 11/27/13 0717 11/28/13 0506  WBC 12.2* 7.3  HGB 13.5 13.4  HCT 44.0 43.6  PLT 224 226  MCV 83.3 82.4  MCH 25.6* 25.3*  MCHC 30.7 30.7  RDW 19.1* 18.8*  LYMPHSABS 2.1  --   MONOABS 1.1*  --   EOSABS 0.1  --   BASOSABS 0.0  --     Chemistries   Recent Labs Lab 11/27/13 0717 11/28/13 0506  NA 140 138  K 4.0 4.0  CL 99 95*  CO2 30 31  GLUCOSE 132* 165*  BUN 12 15  CREATININE 0.63 0.53  CALCIUM 9.5 9.9  AST 9  --   ALT 8  --   ALKPHOS 64  --   BILITOT 0.6  --    ------------------------------------------------------------------------------------------------------------------ estimated creatinine clearance is 86.4 ml/min (by C-G formula based on Cr of 0.53). ------------------------------------------------------------------------------------------------------------------ No results found for this basename: HGBA1C,  in the last 72 hours ------------------------------------------------------------------------------------------------------------------ No results found for this basename: CHOL, HDL, LDLCALC, TRIG, CHOLHDL, LDLDIRECT,  in the last 72 hours ------------------------------------------------------------------------------------------------------------------  Recent Labs  11/27/13 0717  TSH 2.803   ------------------------------------------------------------------------------------------------------------------ No results found for this basename: VITAMINB12, FOLATE, FERRITIN, TIBC, IRON, RETICCTPCT,  in the last 72 hours  Coagulation profile No results found for this basename: INR, PROTIME,  in the last 168 hours  No results found for this basename: DDIMER,   in the last 72 hours  Cardiac Enzymes  Recent Labs Lab 11/27/13 0717  TROPONINI <0.30   ------------------------------------------------------------------------------------------------------------------ No components found with this basename: POCBNP,      Time Spent in minutes  35   Mako Pelfrey K M.D on 11/28/2013 at 10:33 AM  Between 7am to 7pm - Pager - [phone]  After 7pm go to [email or web address] - password [email or web address]  And look for the night coverage person covering for me after hours  [facility] Office  [phone]

## 2013-11-29 LAB — LEGIONELLA ANTIGEN, URINE: Legionella Antigen, Urine: NEGATIVE

## 2013-11-29 LAB — GLUCOSE, CAPILLARY
GLUCOSE-CAPILLARY: 163 mg/dL — AB (ref 70–99)
GLUCOSE-CAPILLARY: 146 mg/dL — AB (ref 70–99)
GLUCOSE-CAPILLARY: 294 mg/dL — AB (ref 70–99)
Glucose-Capillary: 193 mg/dL — ABNORMAL HIGH (ref 70–99)

## 2013-11-29 NOTE — Progress Notes (Signed)
[facility]                                                                                Patient Demographics  Joanna Perez, is a 58 y.o. female, DOB - 12/30/1955, WEX:[id number]  Admit date - 11/27/2013   Admitting Physician Thurnell Lose, MD  Outpatient Primary MD for the patient is Glo Herring., MD  LOS - 2   Chief Complaint  Patient presents with  . Chest Pain  . Asthma        Assessment & Plan    1. Acute on chronic respiratory failure secondary to a combination of COPD exacerbation - sedation from Meds + high Pco2 along with  possible mild CHF decompensation .  She is much improved with IV steroids, empiric antibiotic, scheduled and when necessary nebulizer treatments along with oxygen support, she required BiPAP initially now stable ABGs. I wanted her to wear BiPAP last night but she is refusing it, now she is close to baseline on 4-5 L nasal cannula oxygen which appears to be her home oxygen dose also. I encouraged her to use BiPAP if she gets more short of breath, we'll increase activity and monitor her oxygen saturations today. If stable will discharge tomorrow.       2. Possible mild acute on chronic diastolic CHF EF preserved around 60% on recent echo gram. Continue on daily Lasix for now close to being compensated clinically.      3. Increased somnolence on admission and per family for few days prior to admission also- I think large part of somnolence was caused by polypharmacy, subsequently due to decreased respiratory effort and COPD exacerbation she regained some more carbon dioxide making somnolence worse, this has completely resolved after her narcotics, benzos, Flexeril, Phenergan have been held or reduced, continue present dose, this problem has resolved.      4. Hypothyroidism. Continue home dose Synthroid.  Lab Results  Component Value Date   TSH 2.803 11/27/2013       5. Diabetes mellitus type 2. For now we'll place her on sliding  scale insulin and monitor. Repeat A1c.  Lab Results  Component Value Date   HGBA1C 6.4* 11/27/2013    CBG (last 3)   Recent Labs  11/28/13 1617 11/28/13 2120 11/29/13 0715  GLUCAP 168* 172* 163*      6. Atrial fibrillation. Goal will be rate controlled, we'll continue her Cardizem along with Apixaban.      7. Chronic abdominal pain-back pain. We'll cut back on narcotics benzos and Flexeril to minimal possible. Family and patient educated.      8. Dyslipidemia. On home dose statin.      Code Status: full  Family Communication: husband and son  Disposition Plan: home   Procedures  CXR   Consults  none   Medications  Scheduled Meds: . ALPRAZolam  0.5 mg Oral QHS  . apixaban  5 mg Oral BID  . atorvastatin  20 mg Oral q1800  . bisacodyl  5 mg Oral Daily  . budesonide-formoterol  2 puff Inhalation BID  . diltiazem  240 mg Oral Daily  . furosemide  40 mg Intravenous Daily  . gabapentin  100 mg Oral TID  . insulin aspart  0-9 Units Subcutaneous TID WC  . ipratropium-albuterol  3 mL Nebulization Q4H  . levofloxacin (LEVAQUIN) IV  750 mg Intravenous Q24H  . levothyroxine  112 mcg Oral QAC breakfast  . methylPREDNISolone (SOLU-MEDROL) injection  60 mg Intravenous Q8H  . pantoprazole  40 mg Oral Daily  . sodium chloride  3 mL Intravenous Q12H  . vitamin C  1,000 mg Oral Daily   Continuous Infusions:  PRN Meds:.acetaminophen, guaiFENesin-dextromethorphan, HYDROcodone-acetaminophen, ondansetron (ZOFRAN) IV, polyethylene glycol  DVT Prophylaxis  Apixaban  Lab Results  Component Value Date   PLT 226 11/28/2013    Antibiotics     Anti-infectives   Start     Dose/Rate Route Frequency Ordered Stop   11/28/13 0800  levofloxacin (LEVAQUIN) IVPB 750 mg    Comments:  Pharm for CAP   750 mg 100 mL/hr over 90 Minutes Intravenous Every 24 hours 11/27/13 1850     11/27/13 1900  levofloxacin (LEVAQUIN) IVPB 750 mg  Status:  Discontinued     750 mg 100 mL/hr  over 90 Minutes Intravenous Every 24 hours 11/27/13 1850 11/27/13 1910   11/27/13 0830  levofloxacin (LEVAQUIN) IVPB 750 mg     750 mg 100 mL/hr over 90 Minutes Intravenous  Once 11/27/13 0815 11/27/13 0958          Subjective:   Joanna Perez today has, No headache, No chest pain, No abdominal pain - No Nausea, No new weakness tingling or numbness, No Cough - improved SOB.   Objective:   Filed Vitals:   11/29/13 0406 11/29/13 0453 11/29/13 0744 11/29/13 0810  BP:  143/74    Pulse:  85    Temp:  98 F (36.7 C)    TempSrc:  Oral    Resp:  24    Height:      Weight:  92.443 kg (203 lb 12.8 oz)    SpO2: 89% 89% 84% 90%    Wt Readings from Last 3 Encounters:  11/29/13 92.443 kg (203 lb 12.8 oz)  11/03/13 95.255 kg (210 lb)  10/27/13 95.255 kg (210 lb)     Intake/Output Summary (Last 24 hours) at 11/29/13 0952 Last data filed at 11/28/13 2204  Gross per 24 hour  Intake      3 ml  Output    500 ml  Net   -497 ml    Exam Awake Alert, Oriented X 3, No new F.N deficits, Normal affect .AT,PERRAL Supple Neck,No JVD, No cervical lymphadenopathy appriciated.  Symmetrical Chest wall movement, Good air movement bilaterally, mild wheezing RRR,No Gallops,Rubs or new Murmurs, No Parasternal Heave +ve B.Sounds, Abd Soft, Non tender, No organomegaly appriciated, No rebound - guarding or rigidity. No Cyanosis, Clubbing or edema, No new Rash or bruise     Data Review   Micro Results Recent Results (from the past 240 hour(s))  MRSA PCR SCREENING     Status: None   Collection Time    11/27/13  6:51 PM      Result Value Range Status   MRSA by PCR NEGATIVE  NEGATIVE Final   Comment:            The GeneXpert MRSA Assay (FDA     approved for NASAL specimens     only), is one component of a     comprehensive MRSA colonization     surveillance program. It is not     intended to diagnose MRSA     infection  nor to guide or     monitor treatment for     MRSA infections.     Radiology Reports Dg Chest 2 View  11/27/2013   CLINICAL DATA:  Shortness of breath, chest pain, wheezing and cough.  EXAM: CHEST - 2 VIEW  COMPARISON:  Film earlier today at 0716 hr  FINDINGS: The heart is enlarged. There remains evidence of interstitial prominence and pulmonary venous prominence likely consistent with mild interstitial edema. Small bilateral pleural effusions are present in the lateral projection. No focal airspace consolidation is identified.  IMPRESSION: Cardiomegaly and probable mild interstitial edema. Small bilateral pleural effusions are present.   Electronically Signed   By: Aletta Edouard M.D.   On: 11/27/2013 09:53   Dg Chest 2 View  11/03/2013   CLINICAL DATA:  Cough and congestion.  EXAM: CHEST  2 VIEW  COMPARISON:  09/08/2013  FINDINGS: Two views of the chest demonstrate clear lungs. Heart and mediastinum are stable. The trachea is midline and there is kyphosis in the thoracic spine. Prominent lung markings appear to be chronic.  IMPRESSION: No acute chest abnormalities.   Electronically Signed   By: Markus Daft M.D.   On: 11/03/2013 15:55   Dg Chest Portable 1 View  11/27/2013   CLINICAL DATA:  Midsternal chest pain.  EXAM: PORTABLE CHEST - 1 VIEW  COMPARISON:  Chest x-ray 11/03/2013.  FINDINGS: Lung volumes are slightly low. Film is underpenetrated, reducing the diagnostic sensitivity and specificity of the examination. With these limitations in mind, there are some bibasilar opacities (left greater than right), favored to reflect subsegmental atelectasis. Underlying airspace consolidation in the left lower lobe is difficult to entirely exclude on this limited examination. In addition, there may be a small left pleural effusion, however, this is favored to be technique related. Cephalization of the pulmonary vasculature, without frank pulmonary edema. Heart size appears borderline enlarged, likely accentuated by patient's rotation to the left which also distorts upper  mediastinal contours. Atherosclerosis in the thoracic aorta.  IMPRESSION: 1. Borderline cardiomegaly with pulmonary venous congestion, but no frank pulmonary edema. 2. Probable bibasilar subsegmental atelectasis. The possibility of airspace consolidation in the left lower lobe and/or a small left pleural effusion is not excluded. 3. Atherosclerosis.   Electronically Signed   By: Vinnie Langton M.D.   On: 11/27/2013 07:36    CBC  Recent Labs Lab 11/27/13 0717 11/28/13 0506  WBC 12.2* 7.3  HGB 13.5 13.4  HCT 44.0 43.6  PLT 224 226  MCV 83.3 82.4  MCH 25.6* 25.3*  MCHC 30.7 30.7  RDW 19.1* 18.8*  LYMPHSABS 2.1  --   MONOABS 1.1*  --   EOSABS 0.1  --   BASOSABS 0.0  --     Chemistries   Recent Labs Lab 11/27/13 0717 11/28/13 0506  NA 140 138  K 4.0 4.0  CL 99 95*  CO2 30 31  GLUCOSE 132* 165*  BUN 12 15  CREATININE 0.63 0.53  CALCIUM 9.5 9.9  AST 9  --   ALT 8  --   ALKPHOS 64  --   BILITOT 0.6  --    ------------------------------------------------------------------------------------------------------------------ estimated creatinine clearance is 85.5 ml/min (by C-G formula based on Cr of 0.53). ------------------------------------------------------------------------------------------------------------------  Recent Labs  11/27/13 0717  HGBA1C 6.4*   ------------------------------------------------------------------------------------------------------------------ No results found for this basename: CHOL, HDL, LDLCALC, TRIG, CHOLHDL, LDLDIRECT,  in the last 72 hours ------------------------------------------------------------------------------------------------------------------  Recent Labs  11/27/13 0717  TSH 2.803   ------------------------------------------------------------------------------------------------------------------ No results  found for this basename: VITAMINB12, FOLATE, FERRITIN, TIBC, IRON, RETICCTPCT,  in the last 72 hours  Coagulation  profile No results found for this basename: INR, PROTIME,  in the last 168 hours  No results found for this basename: DDIMER,  in the last 72 hours  Cardiac Enzymes  Recent Labs Lab 11/27/13 0717  TROPONINI <0.30   ------------------------------------------------------------------------------------------------------------------ No components found with this basename: POCBNP,      Time Spent in minutes  35   SINGH,PRASHANT K M.D on 11/29/2013 at 9:52 AM  Between 7am to 7pm - Pager - [phone]  After 7pm go to [email or web address] - password [email or web address]  And look for the night coverage person covering for me after hours  [facility] Office  [phone]

## 2013-11-29 NOTE — Progress Notes (Signed)
Patient declined to use BIPAP tonight. No unit in room at this time.

## 2013-11-30 LAB — GLUCOSE, CAPILLARY
Glucose-Capillary: 203 mg/dL — ABNORMAL HIGH (ref 70–99)
Glucose-Capillary: 200 mg/dL — ABNORMAL HIGH (ref 70–99)

## 2013-11-30 MED ORDER — LEVOFLOXACIN 750 MG PO TABS: 750.0000 mg | ORAL_TABLET | Freq: Every day | ORAL | Status: DC

## 2013-11-30 MED ORDER — PREDNISONE 5 MG PO TABS: ORAL_TABLET | ORAL | Status: DC

## 2013-11-30 MED ORDER — HYDROCODONE-ACETAMINOPHEN 10-325 MG PO TABS: 1.0000 | ORAL_TABLET | Freq: Two times a day (BID) | ORAL | Status: DC | PRN

## 2013-11-30 MED ORDER — ALPRAZOLAM 1 MG PO TABS: 0.5000 mg | ORAL_TABLET | Freq: Every day | ORAL | Status: DC

## 2013-11-30 NOTE — Progress Notes (Addendum)
IV removed, site WNL.  Pt given d/c instructions and new prescriptions.  Discussed home care with patient and discussed home medications in depth, when and how to take, patient and family verbalizes understanding, teachback completed. F/U appointment in place, pt states they will keep appointment. Pt is stable at this time agreeable to d/c plan.  Pt does not wish to stay to eat lunch and does not want insulin.  [facility] has delivered portable tank for pt to be d/ced on.

## 2013-11-30 NOTE — Discharge Instructions (Signed)
Follow with Primary MD Glo Herring., MD in 7 days   Get CBC, CMP, checked 7 days by Primary MD and again as instructed by your Primary MD. Get a 2 view Chest X ray done next visit.   Activity: As tolerated with Full fall precautions use walker/cane & assistance as needed   Disposition Home     Diet:  Heart Healthy.  Check your Weight same time everyday, if you gain over 2 pounds, or you develop in leg swelling, experience more shortness of breath or chest pain, call your Primary MD immediately. Follow Cardiac Low Salt Diet and 1.8 lit/day fluid restriction.   On your next visit with her primary care physician please Get Medicines reviewed and adjusted.  Please request your Prim.MD to go over all Hospital Tests and Procedure/Radiological results at the follow up, please get all Hospital records sent to your Prim MD by signing hospital release before you go home.   If you experience worsening of your admission symptoms, develop shortness of breath, life threatening emergency, suicidal or homicidal thoughts you must seek medical attention immediately by calling 911 or calling your MD immediately  if symptoms less severe.  You Must read complete instructions/literature along with all the possible adverse reactions/side effects for all the Medicines you take and that have been prescribed to you. Take any new Medicines after you have completely understood and accpet all the possible adverse reactions/side effects.   Do not drive and provide baby sitting services if your were admitted for syncope or siezures until you have seen by Primary MD or a Neurologist and advised to do so again.  Do not drive when taking Pain medications.    Do not take more than prescribed Pain, Sleep and Anxiety Medications  Special Instructions: If you have smoked or chewed Tobacco  in the last 2 yrs please stop smoking, stop any regular Alcohol  and or any Recreational drug use.  Wear Seat belts while  driving.   Please note  You were cared for by a hospitalist during your hospital stay. If you have any questions about your discharge medications or the care you received while you were in the hospital after you are discharged, you can call the unit and asked to speak with the hospitalist on call if the hospitalist that took care of you is not available. Once you are discharged, your primary care physician will handle any further medical issues. Please note that NO REFILLS for any discharge medications will be authorized once you are discharged, as it is imperative that you return to your primary care physician (or establish a relationship with a primary care physician if you do not have one) for your aftercare needs so that they can reassess your need for medications and monitor your lab values.

## 2013-11-30 NOTE — Discharge Summary (Signed)
[facility]                                                                                   Joanna Perez, is a 58 y.o. female  DOB [birth year]  MRN [id number].  Admission date:  11/27/2013  Admitting Physician  Thurnell Lose, MD  Discharge Date:  11/30/2013   Primary MD  Glo Herring., MD  Recommendations for primary care physician for things to follow:   Monitor COPD clinically   Admission Diagnosis  Essential hypertension, benign [401.1] Atrial fibrillation [427.31] Backache, unspecified [694.8] Chronic diastolic heart failure [546.27] Bronchospasm [519.11] COPD exacerbation [491.21] Type 2 diabetes mellitus [250.00]  Discharge Diagnosis   COPD exacerbation  Principal Problem:   COPD exacerbation Active Problems:   HYPOTHYROIDISM   Type 2 diabetes mellitus   BACK PAIN, CHRONIC   Atrial fibrillation   Cigarette smoker   Chronic diastolic heart failure   Acute respiratory failure      Past Medical History  Diagnosis Date  . Asthma   . COPD (chronic obstructive pulmonary disease)   . Hypothyroidism   . Essential hypertension, benign   . Hyperlipidemia   . Type 2 diabetes mellitus   . Atrial fibrillation   . MI (myocardial infarction)     2012  . Pericardial effusion 09/09/2013    Past Surgical History  Procedure Laterality Date  . Hernia repair    . Abdominal hysterectomy    . Stomach surgery      Wound vac currently in place  . Esophagogastroduodenoscopy  11/11/2011    Procedure: ESOPHAGOGASTRODUODENOSCOPY (EGD);  Surgeon: Rogene Houston, MD;  Location: [facility];  Service: Endoscopy;  Laterality: N/A;  . Foreign body removal  11/11/2011    Procedure: FOREIGN BODY REMOVAL;  Surgeon: Rogene Houston, MD;  Location: [facility];  Service: Endoscopy;  Laterality: N/A;  . Tracheal surgery    . Abdominal surgery       Discharge Condition: Stable   Follow-up Information   Follow up with Glo Herring., MD. Schedule an appointment  as soon as possible for a visit in 1 week.   Specialty:  Internal Medicine   Contact information:   [address] [place] [address] [phone]       Follow up with HAWKINS,EDWARD L, MD. Schedule an appointment as soon as possible for a visit in 1 week.   Specialty:  Pulmonary Disease   Contact information:   [place] [address] [phone]         Consults obtained - none   Discharge Medications      Medication List    STOP taking these medications       AMBIEN 5 MG tablet  Generic drug:  zolpidem     cyclobenzaprine 5 MG tablet  Commonly known as:  FLEXERIL     oxyCODONE 5 MG immediate release tablet  Commonly known as:  Oxy IR/ROXICODONE     promethazine 12.5 MG tablet  Commonly known as:  PHENERGAN      TAKE these medications       albuterol 108 (90 BASE) MCG/ACT  inhaler  Commonly known as:  PROVENTIL HFA;VENTOLIN HFA  Inhale 1 puff into the lungs every 4 (four) hours as needed for wheezing or shortness of breath.     albuterol-ipratropium 18-103 MCG/ACT inhaler  Commonly known as:  COMBIVENT  Inhale 2 puffs into the lungs every 6 (six) hours as needed for wheezing or shortness of breath.     ALPRAZolam 1 MG tablet  Commonly known as:  XANAX  Take 0.5 tablets (0.5 mg total) by mouth at bedtime.     apixaban 5 MG Tabs tablet  Commonly known as:  ELIQUIS  Take 1 tablet (5 mg total) by mouth 2 (two) times daily.     bisacodyl 5 MG EC tablet  Commonly known as:  DULCOLAX  Take 5 mg by mouth daily as needed for moderate constipation.     budesonide-formoterol 80-4.5 MCG/ACT inhaler  Commonly known as:  SYMBICORT  Inhale 2 puffs into the lungs 2 (two) times daily.     budesonide-formoterol 160-4.5 MCG/ACT inhaler  Commonly known as:  SYMBICORT  Inhale 2 puffs into the lungs.     citalopram 10 MG tablet  Commonly known as:  CELEXA  Take 10 mg by mouth daily.     diltiazem 240 MG 24 hr  capsule  Commonly known as:  CARDIZEM CD  Take 1 capsule (240 mg total) by mouth daily.     fluticasone 50 MCG/ACT nasal spray  Commonly known as:  FLONASE  Place 1 spray into both nostrils.     furosemide 20 MG tablet  Commonly known as:  LASIX  Take 40 mg by mouth 2 (two) times daily.     guaiFENesin 600 MG 12 hr tablet  Commonly known as:  MUCINEX  Take 600 mg by mouth 2 (two) times daily.     HYDROcodone-acetaminophen 10-325 MG per tablet  Commonly known as:  NORCO  Take 1 tablet by mouth every 12 (twelve) hours as needed for severe pain.     levofloxacin 750 MG tablet  Commonly known as:  LEVAQUIN  Take 1 tablet (750 mg total) by mouth daily.     levothyroxine 112 MCG tablet  Commonly known as:  SYNTHROID, LEVOTHROID  Take 112 mcg by mouth daily before breakfast.     NEURONTIN 100 MG capsule  Generic drug:  gabapentin  Take 100 mg by mouth 3 (three) times daily.     pantoprazole 40 MG tablet  Commonly known as:  PROTONIX  Take 40 mg by mouth daily.     predniSONE 5 MG tablet  Commonly known as:  DELTASONE  Label  & dispense according to the schedule below. 10 Pills PO for 3 days then, 8 Pills PO for 3 days, 6 Pills PO for 3 days, 4 Pills PO for 3 days, 2 Pills PO for 3 days, 1 Pills PO for 3 days, 1/2 Pill  PO for 3 days then STOP. Total 95 pills.     ramipril 10 MG capsule  Commonly known as:  ALTACE  Take 10 mg by mouth daily.     simvastatin 40 MG tablet  Commonly known as:  ZOCOR  Take 40 mg by mouth daily at 6 PM.     theophylline 200 MG 24 hr capsule  Commonly known as:  THEO-24  400 mg 2 (two) times daily.     vitamin C 1000 MG tablet  Take 1,000 mg by mouth daily.         Diet and Activity recommendation: See  Discharge Instructions below   Discharge Instructions     Follow with Primary MD Glo Herring., MD in 7 days   Get CBC, CMP, checked 7 days by Primary MD and again as instructed by your Primary MD. Get a 2 view Chest X ray done  next visit.   Activity: As tolerated with Full fall precautions use walker/cane & assistance as needed   Disposition Home     Diet:  Heart Healthy.  Check your Weight same time everyday, if you gain over 2 pounds, or you develop in leg swelling, experience more shortness of breath or chest pain, call your Primary MD immediately. Follow Cardiac Low Salt Diet and 1.8 lit/day fluid restriction.   On your next visit with her primary care physician please Get Medicines reviewed and adjusted.  Please request your Prim.MD to go over all Hospital Tests and Procedure/Radiological results at the follow up, please get all Hospital records sent to your Prim MD by signing hospital release before you go home.   If you experience worsening of your admission symptoms, develop shortness of breath, life threatening emergency, suicidal or homicidal thoughts you must seek medical attention immediately by calling 911 or calling your MD immediately  if symptoms less severe.  You Must read complete instructions/literature along with all the possible adverse reactions/side effects for all the Medicines you take and that have been prescribed to you. Take any new Medicines after you have completely understood and accpet all the possible adverse reactions/side effects.   Do not drive and provide baby sitting services if your were admitted for syncope or siezures until you have seen by Primary MD or a Neurologist and advised to do so again.  Do not drive when taking Pain medications.    Do not take more than prescribed Pain, Sleep and Anxiety Medications  Special Instructions: If you have smoked or chewed Tobacco  in the last 2 yrs please stop smoking, stop any regular Alcohol  and or any Recreational drug use.  Wear Seat belts while driving.   Please note  You were cared for by a hospitalist during your hospital stay. If you have any questions about your discharge medications or the care you received while  you were in the hospital after you are discharged, you can call the unit and asked to speak with the hospitalist on call if the hospitalist that took care of you is not available. Once you are discharged, your primary care physician will handle any further medical issues. Please note that NO REFILLS for any discharge medications will be authorized once you are discharged, as it is imperative that you return to your primary care physician (or establish a relationship with a primary care physician if you do not have one) for your aftercare needs so that they can reassess your need for medications and monitor your lab values.     Major procedures and Radiology Reports - PLEASE review detailed and final reports for all details, in brief -   Echo  Study data: M-mode, limited 2D, limited spectral Doppler, and color Doppler. - Left ventricle: Wall thickness was increased in a pattern of mild LVH. Systolic function was normal. The estimated ejection fraction was in the range of 50% to 55%. Wall motion was normal; there were no regional wall motion abnormalities. Features are consistent with a pseudonormal left ventricular filling pattern, with concomitant abnormal relaxation and increased filling pressure (grade 2 diastolic dysfunction). Doppler parameters are consistent with high ventricular filling pressure. - Ventricular  septum: Septal motion showed abnormal function and dyssynergy. - Aortic valve: Calcified annulus. - Mitral valve: Markedly diminished respiratory variation with transmitral Doppler flow signal when compared to echocardiogram performed on 09/08/2013 (no hemodynamic compromise with present study). Calcified annulus. Mild regurgitation. - Left atrium: The atrium was moderately dilated. - Inferior vena cava: The vessel was dilated. - Pericardium, extracardiac: A trivial pericardial effusion was identified. When compared with the previous study from 09/08/2013, there is a  marked reduction in size of the effusion. There is no hemodynamic sequelae identified.     Dg Chest 2 View  11/27/2013   CLINICAL DATA:  Shortness of breath, chest pain, wheezing and cough.  EXAM: CHEST - 2 VIEW  COMPARISON:  Film earlier today at 0716 hr  FINDINGS: The heart is enlarged. There remains evidence of interstitial prominence and pulmonary venous prominence likely consistent with mild interstitial edema. Small bilateral pleural effusions are present in the lateral projection. No focal airspace consolidation is identified.  IMPRESSION: Cardiomegaly and probable mild interstitial edema. Small bilateral pleural effusions are present.   Electronically Signed   By: Aletta Edouard M.D.   On: 11/27/2013 09:53   Dg Chest 2 View  11/03/2013   CLINICAL DATA:  Cough and congestion.  EXAM: CHEST  2 VIEW  COMPARISON:  09/08/2013  FINDINGS: Two views of the chest demonstrate clear lungs. Heart and mediastinum are stable. The trachea is midline and there is kyphosis in the thoracic spine. Prominent lung markings appear to be chronic.  IMPRESSION: No acute chest abnormalities.   Electronically Signed   By: Markus Daft M.D.   On: 11/03/2013 15:55   Dg Chest Portable 1 View  11/27/2013   CLINICAL DATA:  Midsternal chest pain.  EXAM: PORTABLE CHEST - 1 VIEW  COMPARISON:  Chest x-ray 11/03/2013.  FINDINGS: Lung volumes are slightly low. Film is underpenetrated, reducing the diagnostic sensitivity and specificity of the examination. With these limitations in mind, there are some bibasilar opacities (left greater than right), favored to reflect subsegmental atelectasis. Underlying airspace consolidation in the left lower lobe is difficult to entirely exclude on this limited examination. In addition, there may be a small left pleural effusion, however, this is favored to be technique related. Cephalization of the pulmonary vasculature, without frank pulmonary edema. Heart size appears borderline enlarged, likely  accentuated by patient's rotation to the left which also distorts upper mediastinal contours. Atherosclerosis in the thoracic aorta.  IMPRESSION: 1. Borderline cardiomegaly with pulmonary venous congestion, but no frank pulmonary edema. 2. Probable bibasilar subsegmental atelectasis. The possibility of airspace consolidation in the left lower lobe and/or a small left pleural effusion is not excluded. 3. Atherosclerosis.   Electronically Signed   By: Vinnie Langton M.D.   On: 11/27/2013 07:36    Micro Results      Recent Results (from the past 240 hour(s))  MRSA PCR SCREENING     Status: None   Collection Time    11/27/13  6:51 PM      Result Value Range Status   MRSA by PCR NEGATIVE  NEGATIVE Final   Comment:            The GeneXpert MRSA Assay (FDA     approved for NASAL specimens     only), is one component of a     comprehensive MRSA colonization     surveillance program. It is not     intended to diagnose MRSA     infection nor to guide or  monitor treatment for     MRSA infections.     History of present illness and  Hospital Course:     Kindly see H&P for history of present illness and admission details, please review complete Labs, Consult reports and Test reports for all details in brief Joanna Perez, is a 58 y.o. female, patient with history of  COPD on 2-3 L nasal 10 oxygen at home, ongoing cigarette smoker, chronic diastolic CHF, atrial fibrillation, diabetes mellitus type 2, chronic pain and anxiety on narcotics and multiple sedative medications, has been brought in by her husband because of a productive cough, some increased shortness of breath and increased sleepiness for the last few days. No fever chills, no chest pain or palpitations, no body aches which are new, she does have chronic back and abdominal pain, brought in to the ER where workup was consistent with COPD exacerbation along with polypharmacy.     1. Acute on chronic respiratory failure secondary  to a combination of COPD exacerbation - sedation from Meds + high Pco2 along with possible mild CHF decompensation . She is much improved her treatment with IV steroids, empiric antibiotic, scheduled and when necessary nebulizer treatments along with oxygen support, she required BiPAP initially now stable ABGs and she is tolerating between 3-4 L nasal cannula oxygen.    Will be discharged home on a few more days of Levaquin and steroid taper, I will send her out on 4 L nasal cannula oxygen which can be gradually titrated down, of note at home she was using initially 2-3 L nasal cannula oxygen but at times was increasing it to 4 L. Currently bedside on 4 L nasal 10 oxygen she is 94%. Admitted in the hallway yesterday without any shortness of breath.    2. Possible mild acute on chronic diastolic CHF EF preserved around 60% on recent echo gram. Considered after a few doses of IV Lasix, will continue on home dose Lasix post discharge, educated on salt and fluid restriction.      3. Increased somnolence on admission and per family for few days prior to admission also- I think large part of somnolence was caused by polypharmacy, subsequently due to decreased respiratory effort and COPD exacerbation she retained some more carbon dioxide making somnolence worse, this has completely resolved after her narcotics, benzos, Flexeril, Phenergan have been held or reduced, continue present dose, this problem has resolved. Avoid multiple narcotics and sedative medications in the future.     4. Hypothyroidism. Continue home dose Synthroid.   Lab Results   Component  Value  Date    TSH  2.803  11/27/2013       5. Diabetes mellitus type 2. likely prediabetic.   Lab Results   Component  Value  Date    HGBA1C  6.4*  11/27/2013        6. Atrial fibrillation. Goal will be rate controll, we'll continue her Cardizem along with Apixaban.      7. Chronic abdominal pain-back pain. We'll cut back on  narcotics, benzos , then off of Flexeril and tolerating it well. Family and patient educated.      8. Dyslipidemia. On home dose statin.               Today   Subjective:   Joanna Perez today has no headache,no chest abdominal pain,no new weakness tingling or numbness, feels much better wants to go home today.   Objective:   Blood pressure 157/76, pulse 93, temperature  97.7 F (36.5 C), temperature source Oral, resp. rate 20, height 5\' 4"  (1.626 m), weight 92.443 kg (203 lb 12.8 oz), SpO2 91.00%.   Intake/Output Summary (Last 24 hours) at 11/30/13 0833 Last data filed at 11/29/13 1700  Gross per 24 hour  Intake    480 ml  Output      0 ml  Net    480 ml    Exam Awake Alert, Oriented *3, No new F.N deficits, Normal affect [place].AT,PERRAL Supple Neck,No JVD, No cervical lymphadenopathy appriciated.  Symmetrical Chest wall movement, Good air movement bilaterally, CTAB RRR,No Gallops,Rubs or new Murmurs, No Parasternal Heave +ve B.Sounds, Abd Soft, Non tender, No organomegaly appriciated, No rebound -guarding or rigidity. No Cyanosis, Clubbing or edema, No new Rash or bruise  Data Review   CBC w Diff: Lab Results  Component Value Date   WBC 7.3 11/28/2013   HGB 13.4 11/28/2013   HCT 43.6 11/28/2013   PLT 226 11/28/2013   LYMPHOPCT 17 11/27/2013   MONOPCT 9 11/27/2013   EOSPCT 1 11/27/2013   BASOPCT 0 11/27/2013    CMP: Lab Results  Component Value Date   NA 138 11/28/2013   K 4.0 11/28/2013   CL 95* 11/28/2013   CO2 31 11/28/2013   BUN 15 11/28/2013   CREATININE 0.53 11/28/2013   CREATININE 0.74 10/31/2013   PROT 6.6 11/27/2013   ALBUMIN 3.1* 11/27/2013   BILITOT 0.6 11/27/2013   ALKPHOS 64 11/27/2013   AST 9 11/27/2013   ALT 8 11/27/2013  .   Total Time in preparing paper work, data evaluation and todays exam - 35 minutes  Thurnell Lose M.D on 11/30/2013 at 8:33 AM  [facility] Office  [phone]

## 2013-12-18 ENCOUNTER — Emergency Department (HOSPITAL_COMMUNITY): Payer: Medicaid Other

## 2013-12-18 ENCOUNTER — Encounter (HOSPITAL_COMMUNITY): Payer: Self-pay | Admitting: Emergency Medicine

## 2013-12-18 ENCOUNTER — Inpatient Hospital Stay (HOSPITAL_COMMUNITY)
Admission: EM | Admit: 2013-12-18 | Discharge: 2013-12-22 | DRG: 190 | Disposition: A | Discharge status: Home Health | Payer: Medicaid Other | Attending: Internal Medicine | Admitting: Internal Medicine

## 2013-12-18 DIAGNOSIS — E1165 Type 2 diabetes mellitus with hyperglycemia: Secondary | ICD-10-CM

## 2013-12-18 DIAGNOSIS — B379 Candidiasis, unspecified: Secondary | ICD-10-CM | POA: Diagnosis present

## 2013-12-18 DIAGNOSIS — F411 Generalized anxiety disorder: Secondary | ICD-10-CM | POA: Diagnosis present

## 2013-12-18 DIAGNOSIS — N760 Acute vaginitis: Secondary | ICD-10-CM | POA: Diagnosis present

## 2013-12-18 DIAGNOSIS — E039 Hypothyroidism, unspecified: Secondary | ICD-10-CM | POA: Diagnosis present

## 2013-12-18 DIAGNOSIS — Z833 Family history of diabetes mellitus: Secondary | ICD-10-CM

## 2013-12-18 DIAGNOSIS — E669 Obesity, unspecified: Secondary | ICD-10-CM | POA: Diagnosis present

## 2013-12-18 DIAGNOSIS — G894 Chronic pain syndrome: Secondary | ICD-10-CM | POA: Diagnosis present

## 2013-12-18 DIAGNOSIS — E873 Alkalosis: Secondary | ICD-10-CM | POA: Diagnosis present

## 2013-12-18 DIAGNOSIS — I5032 Chronic diastolic (congestive) heart failure: Secondary | ICD-10-CM

## 2013-12-18 DIAGNOSIS — I313 Pericardial effusion (noninflammatory): Secondary | ICD-10-CM

## 2013-12-18 DIAGNOSIS — F329 Major depressive disorder, single episode, unspecified: Secondary | ICD-10-CM

## 2013-12-18 DIAGNOSIS — E785 Hyperlipidemia, unspecified: Secondary | ICD-10-CM | POA: Diagnosis present

## 2013-12-18 DIAGNOSIS — J45901 Unspecified asthma with (acute) exacerbation: Principal | ICD-10-CM

## 2013-12-18 DIAGNOSIS — R0902 Hypoxemia: Secondary | ICD-10-CM

## 2013-12-18 DIAGNOSIS — F17200 Nicotine dependence, unspecified, uncomplicated: Secondary | ICD-10-CM | POA: Diagnosis present

## 2013-12-18 DIAGNOSIS — J441 Chronic obstructive pulmonary disease with (acute) exacerbation: Principal | ICD-10-CM | POA: Diagnosis present

## 2013-12-18 DIAGNOSIS — J45909 Unspecified asthma, uncomplicated: Secondary | ICD-10-CM

## 2013-12-18 DIAGNOSIS — Z6837 Body mass index (BMI) 37.0-37.9, adult: Secondary | ICD-10-CM

## 2013-12-18 DIAGNOSIS — J9610 Chronic respiratory failure, unspecified whether with hypoxia or hypercapnia: Secondary | ICD-10-CM

## 2013-12-18 DIAGNOSIS — F17210 Nicotine dependence, cigarettes, uncomplicated: Secondary | ICD-10-CM

## 2013-12-18 DIAGNOSIS — T380X5A Adverse effect of glucocorticoids and synthetic analogues, initial encounter: Secondary | ICD-10-CM | POA: Diagnosis present

## 2013-12-18 DIAGNOSIS — J9600 Acute respiratory failure, unspecified whether with hypoxia or hypercapnia: Secondary | ICD-10-CM | POA: Diagnosis present

## 2013-12-18 DIAGNOSIS — M549 Dorsalgia, unspecified: Secondary | ICD-10-CM

## 2013-12-18 DIAGNOSIS — I4891 Unspecified atrial fibrillation: Secondary | ICD-10-CM | POA: Diagnosis present

## 2013-12-18 DIAGNOSIS — I10 Essential (primary) hypertension: Secondary | ICD-10-CM | POA: Diagnosis present

## 2013-12-18 DIAGNOSIS — IMO0001 Reserved for inherently not codable concepts without codable children: Secondary | ICD-10-CM | POA: Diagnosis present

## 2013-12-18 DIAGNOSIS — I252 Old myocardial infarction: Secondary | ICD-10-CM

## 2013-12-18 DIAGNOSIS — I5033 Acute on chronic diastolic (congestive) heart failure: Secondary | ICD-10-CM | POA: Diagnosis present

## 2013-12-18 DIAGNOSIS — F3289 Other specified depressive episodes: Secondary | ICD-10-CM

## 2013-12-18 DIAGNOSIS — E119 Type 2 diabetes mellitus without complications: Secondary | ICD-10-CM | POA: Diagnosis present

## 2013-12-18 DIAGNOSIS — I509 Heart failure, unspecified: Secondary | ICD-10-CM | POA: Diagnosis present

## 2013-12-18 DIAGNOSIS — J449 Chronic obstructive pulmonary disease, unspecified: Secondary | ICD-10-CM

## 2013-12-18 DIAGNOSIS — I3139 Other pericardial effusion (noninflammatory): Secondary | ICD-10-CM

## 2013-12-18 DIAGNOSIS — Z79899 Other long term (current) drug therapy: Secondary | ICD-10-CM

## 2013-12-18 DIAGNOSIS — E876 Hypokalemia: Secondary | ICD-10-CM | POA: Diagnosis present

## 2013-12-18 DIAGNOSIS — Z9981 Dependence on supplemental oxygen: Secondary | ICD-10-CM

## 2013-12-18 LAB — BLOOD GAS, ARTERIAL
ACID-BASE EXCESS: 5.8 mmol/L — AB (ref 0.0–2.0)
BICARBONATE: 31 meq/L — AB (ref 20.0–24.0)
Drawn by: 234301
O2 Content: 4 L/min
O2 SAT: 90.5 %
PATIENT TEMPERATURE: 37
PO2 ART: 69 mmHg — AB (ref 80.0–100.0)
TCO2: 28 mmol/L (ref 0–100)
pCO2 arterial: 55.6 mmHg — ABNORMAL HIGH (ref 35.0–45.0)
pH, Arterial: 7.365 (ref 7.350–7.450)

## 2013-12-18 LAB — GLUCOSE, CAPILLARY
Glucose-Capillary: 258 mg/dL — ABNORMAL HIGH (ref 70–99)
Glucose-Capillary: 145 mg/dL — ABNORMAL HIGH (ref 70–99)

## 2013-12-18 LAB — COMPREHENSIVE METABOLIC PANEL
ALT: 10 U/L (ref 0–35)
AST: 10 U/L (ref 0–37)
Albumin: 2.6 g/dL — ABNORMAL LOW (ref 3.5–5.2)
Alkaline Phosphatase: 78 U/L (ref 39–117)
BUN: 16 mg/dL (ref 6–23)
CHLORIDE: 95 meq/L — AB (ref 96–112)
CO2: 32 meq/L (ref 19–32)
Calcium: 9.1 mg/dL (ref 8.4–10.5)
Creatinine, Ser: 0.92 mg/dL (ref 0.50–1.10)
GFR, EST AFRICAN AMERICAN: 79 mL/min — AB (ref >90)
GFR, EST NON AFRICAN AMERICAN: 68 mL/min — AB (ref >90)
GLUCOSE: 179 mg/dL — AB (ref 70–99)
Potassium: 3.9 mEq/L (ref 3.7–5.3)
Sodium: 138 mEq/L (ref 137–147)
Total Bilirubin: 0.4 mg/dL (ref 0.3–1.2)
Total Protein: 6.5 g/dL (ref 6.0–8.3)

## 2013-12-18 LAB — CBC WITH DIFFERENTIAL/PLATELET
Basophils Absolute: 0 10*3/uL (ref 0.0–0.1)
Basophils Relative: 0 % (ref 0–1)
Eosinophils Absolute: 0.1 10*3/uL (ref 0.0–0.7)
Eosinophils Relative: 0 % (ref 0–5)
HEMATOCRIT: 39.7 % (ref 36.0–46.0)
HEMOGLOBIN: 12.6 g/dL (ref 12.0–15.0)
LYMPHS ABS: 1.2 10*3/uL (ref 0.7–4.0)
LYMPHS PCT: 9 % — AB (ref 12–46)
MCH: 25.5 pg — ABNORMAL LOW (ref 26.0–34.0)
MCHC: 31.7 g/dL (ref 30.0–36.0)
MCV: 80.2 fL (ref 78.0–100.0)
MONO ABS: 0.6 10*3/uL (ref 0.1–1.0)
MONOS PCT: 4 % (ref 3–12)
NEUTROS ABS: 12.6 10*3/uL — AB (ref 1.7–7.7)
Neutrophils Relative %: 87 % — ABNORMAL HIGH (ref 43–77)
Platelets: 116 10*3/uL — ABNORMAL LOW (ref 150–400)
RBC: 4.95 MIL/uL (ref 3.87–5.11)
RDW: 17.1 % — ABNORMAL HIGH (ref 11.5–15.5)
WBC: 14.5 10*3/uL — AB (ref 4.0–10.5)

## 2013-12-18 LAB — PRO B NATRIURETIC PEPTIDE: Pro B Natriuretic peptide (BNP): 258.8 pg/mL — ABNORMAL HIGH (ref 0–125)

## 2013-12-18 LAB — THEOPHYLLINE LEVEL: Theophylline Lvl: 24.9 ug/mL — ABNORMAL HIGH (ref 10.0–20.0)

## 2013-12-18 LAB — TROPONIN I
Troponin I: <0.3 ng/mL (ref <0.30)
Troponin I: <0.3 ng/mL (ref <0.30)

## 2013-12-18 LAB — MRSA PCR SCREENING: MRSA by PCR: NEGATIVE

## 2013-12-18 MED ORDER — IPRATROPIUM BROMIDE 0.02 % IN SOLN
0.5000 mg | Freq: Once | RESPIRATORY_TRACT | Status: AC
  Administered 2013-12-18: 0.5 mg via RESPIRATORY_TRACT
  Filled 2013-12-18: qty 2.5

## 2013-12-18 MED ORDER — BUDESONIDE 0.25 MG/2ML IN SUSP
0.2500 mg | Freq: Two times a day (BID) | RESPIRATORY_TRACT | Status: DC
  Administered 2013-12-18 – 2013-12-22 (×8): 0.25 mg via RESPIRATORY_TRACT
  Filled 2013-12-18 (×15): qty 2

## 2013-12-18 MED ORDER — IPRATROPIUM BROMIDE 0.02 % IN SOLN
0.5000 mg | RESPIRATORY_TRACT | Status: DC
  Administered 2013-12-18 – 2013-12-22 (×24): 0.5 mg via RESPIRATORY_TRACT
  Filled 2013-12-18 (×23): qty 2.5

## 2013-12-18 MED ORDER — ALBUTEROL SULFATE (2.5 MG/3ML) 0.083% IN NEBU: 2.5000 mg | INHALATION_SOLUTION | Freq: Once | RESPIRATORY_TRACT | Status: DC

## 2013-12-18 MED ORDER — GABAPENTIN 100 MG PO CAPS
100.0000 mg | ORAL_CAPSULE | Freq: Three times a day (TID) | ORAL | Status: DC
  Administered 2013-12-18 – 2013-12-22 (×12): 100 mg via ORAL
  Filled 2013-12-18 (×12): qty 1

## 2013-12-18 MED ORDER — ALBUTEROL (5 MG/ML) CONTINUOUS INHALATION SOLN
15.0000 mg/h | INHALATION_SOLUTION | Freq: Once | RESPIRATORY_TRACT | Status: AC
  Administered 2013-12-18: 15 mg/h via RESPIRATORY_TRACT
  Filled 2013-12-18: qty 20

## 2013-12-18 MED ORDER — ACETAMINOPHEN 650 MG RE SUPP: 650.0000 mg | Freq: Four times a day (QID) | RECTAL | Status: DC | PRN

## 2013-12-18 MED ORDER — SIMVASTATIN 20 MG PO TABS
40.0000 mg | ORAL_TABLET | Freq: Every day | ORAL | Status: DC
  Administered 2013-12-18 – 2013-12-21 (×4): 40 mg via ORAL
  Filled 2013-12-18 (×4): qty 2

## 2013-12-18 MED ORDER — BUDESONIDE-FORMOTEROL FUMARATE 160-4.5 MCG/ACT IN AERO
2.0000 | INHALATION_SPRAY | Freq: Two times a day (BID) | RESPIRATORY_TRACT | Status: DC
  Administered 2013-12-18 – 2013-12-22 (×8): 2 via RESPIRATORY_TRACT
  Filled 2013-12-18: qty 6

## 2013-12-18 MED ORDER — ACETAMINOPHEN 325 MG PO TABS: 650.0000 mg | ORAL_TABLET | Freq: Four times a day (QID) | ORAL | Status: DC | PRN

## 2013-12-18 MED ORDER — HYDROMORPHONE HCL PF 1 MG/ML IJ SOLN: 1.0000 mg | INTRAMUSCULAR | Status: DC | PRN

## 2013-12-18 MED ORDER — FUROSEMIDE 10 MG/ML IJ SOLN
80.0000 mg | Freq: Once | INTRAMUSCULAR | Status: AC
Start: 2013-12-18 — End: 2013-12-18
  Administered 2013-12-18: 80 mg via INTRAVENOUS
  Filled 2013-12-18: qty 8

## 2013-12-18 MED ORDER — IPRATROPIUM-ALBUTEROL 0.5-2.5 (3) MG/3ML IN SOLN
3.0000 mL | Freq: Once | RESPIRATORY_TRACT | Status: DC
  Filled 2013-12-18: qty 3

## 2013-12-18 MED ORDER — APIXABAN 5 MG PO TABS
5.0000 mg | ORAL_TABLET | Freq: Two times a day (BID) | ORAL | Status: DC
  Administered 2013-12-18 – 2013-12-22 (×9): 5 mg via ORAL
  Filled 2013-12-18 (×11): qty 1

## 2013-12-18 MED ORDER — FLUTICASONE PROPIONATE 50 MCG/ACT NA SUSP
1.0000 | Freq: Every day | NASAL | Status: DC
  Administered 2013-12-19 – 2013-12-22 (×4): 1 via NASAL
  Filled 2013-12-18: qty 16

## 2013-12-18 MED ORDER — INSULIN ASPART 100 UNIT/ML ~~LOC~~ SOLN
0.0000 [IU] | Freq: Every day | SUBCUTANEOUS | Status: DC
  Administered 2013-12-21: 2 [IU] via SUBCUTANEOUS

## 2013-12-18 MED ORDER — ALBUTEROL SULFATE (2.5 MG/3ML) 0.083% IN NEBU
INHALATION_SOLUTION | RESPIRATORY_TRACT | Status: AC
  Filled 2013-12-18: qty 3

## 2013-12-18 MED ORDER — LEVALBUTEROL HCL 0.63 MG/3ML IN NEBU
0.6300 mg | INHALATION_SOLUTION | RESPIRATORY_TRACT | Status: DC
  Administered 2013-12-18 – 2013-12-22 (×25): 0.63 mg via RESPIRATORY_TRACT
  Filled 2013-12-18 (×24): qty 3

## 2013-12-18 MED ORDER — ALBUTEROL SULFATE (2.5 MG/3ML) 0.083% IN NEBU: 5.0000 mg | INHALATION_SOLUTION | RESPIRATORY_TRACT | Status: AC | PRN

## 2013-12-18 MED ORDER — INSULIN ASPART 100 UNIT/ML ~~LOC~~ SOLN
0.0000 [IU] | Freq: Three times a day (TID) | SUBCUTANEOUS | Status: DC
  Administered 2013-12-18: 8 [IU] via SUBCUTANEOUS
  Administered 2013-12-19 (×2): 3 [IU] via SUBCUTANEOUS
  Administered 2013-12-19: 2 [IU] via SUBCUTANEOUS
  Administered 2013-12-20: 5 [IU] via SUBCUTANEOUS
  Administered 2013-12-20: 3 [IU] via SUBCUTANEOUS
  Administered 2013-12-20: 5 [IU] via SUBCUTANEOUS
  Administered 2013-12-21: 8 [IU] via SUBCUTANEOUS
  Administered 2013-12-21: 3 [IU] via SUBCUTANEOUS
  Administered 2013-12-21: 8 [IU] via SUBCUTANEOUS
  Administered 2013-12-22: 3 [IU] via SUBCUTANEOUS

## 2013-12-18 MED ORDER — DILTIAZEM HCL ER COATED BEADS 240 MG PO CP24
240.0000 mg | ORAL_CAPSULE | Freq: Every day | ORAL | Status: DC
  Administered 2013-12-18 – 2013-12-22 (×5): 240 mg via ORAL
  Filled 2013-12-18 (×5): qty 1

## 2013-12-18 MED ORDER — HYDROCODONE-ACETAMINOPHEN 10-325 MG PO TABS
1.0000 | ORAL_TABLET | Freq: Two times a day (BID) | ORAL | Status: DC | PRN
  Administered 2013-12-19 – 2013-12-21 (×5): 1 via ORAL
  Filled 2013-12-18 (×5): qty 1

## 2013-12-18 MED ORDER — ONDANSETRON HCL 4 MG/2ML IJ SOLN: 4.0000 mg | Freq: Four times a day (QID) | INTRAMUSCULAR | Status: DC | PRN

## 2013-12-18 MED ORDER — FUROSEMIDE 10 MG/ML IJ SOLN
40.0000 mg | Freq: Two times a day (BID) | INTRAMUSCULAR | Status: DC
  Administered 2013-12-18 – 2013-12-22 (×8): 40 mg via INTRAVENOUS
  Filled 2013-12-18 (×8): qty 4

## 2013-12-18 MED ORDER — CITALOPRAM HYDROBROMIDE 20 MG PO TABS
10.0000 mg | ORAL_TABLET | Freq: Every day | ORAL | Status: DC
  Administered 2013-12-18 – 2013-12-22 (×5): 10 mg via ORAL
  Filled 2013-12-18 (×5): qty 1

## 2013-12-18 MED ORDER — METHYLPREDNISOLONE SODIUM SUCC 125 MG IJ SOLR
60.0000 mg | Freq: Two times a day (BID) | INTRAMUSCULAR | Status: DC
  Administered 2013-12-18 – 2013-12-21 (×6): 60 mg via INTRAVENOUS
  Filled 2013-12-18 (×6): qty 2

## 2013-12-18 MED ORDER — THEOPHYLLINE ER 200 MG PO TB12
400.0000 mg | ORAL_TABLET | Freq: Two times a day (BID) | ORAL | Status: DC
  Administered 2013-12-18: 400 mg via ORAL
  Filled 2013-12-18: qty 2

## 2013-12-18 MED ORDER — LORATADINE 10 MG PO TABS
10.0000 mg | ORAL_TABLET | Freq: Every day | ORAL | Status: DC
  Administered 2013-12-18 – 2013-12-22 (×5): 10 mg via ORAL
  Filled 2013-12-18 (×5): qty 1

## 2013-12-18 MED ORDER — LEVOTHYROXINE SODIUM 112 MCG PO TABS
112.0000 ug | ORAL_TABLET | Freq: Every day | ORAL | Status: DC
  Administered 2013-12-19 – 2013-12-22 (×4): 112 ug via ORAL
  Filled 2013-12-18 (×7): qty 1

## 2013-12-18 MED ORDER — PANTOPRAZOLE SODIUM 40 MG PO TBEC
40.0000 mg | DELAYED_RELEASE_TABLET | Freq: Every day | ORAL | Status: DC
  Administered 2013-12-18 – 2013-12-22 (×5): 40 mg via ORAL
  Filled 2013-12-18 (×6): qty 1

## 2013-12-18 MED ORDER — BISACODYL 5 MG PO TBEC
5.0000 mg | DELAYED_RELEASE_TABLET | Freq: Every day | ORAL | Status: DC | PRN
  Administered 2013-12-21: 5 mg via ORAL
  Filled 2013-12-18: qty 1

## 2013-12-18 MED ORDER — ALPRAZOLAM 0.5 MG PO TABS
0.5000 mg | ORAL_TABLET | Freq: Every day | ORAL | Status: DC
  Administered 2013-12-18 – 2013-12-21 (×4): 0.5 mg via ORAL
  Filled 2013-12-18 (×4): qty 1

## 2013-12-18 MED ORDER — METHYLPREDNISOLONE SODIUM SUCC 125 MG IJ SOLR
125.0000 mg | Freq: Once | INTRAMUSCULAR | Status: AC
  Administered 2013-12-18: 125 mg via INTRAVENOUS
  Filled 2013-12-18: qty 2

## 2013-12-18 MED ORDER — GUAIFENESIN ER 600 MG PO TB12
1200.0000 mg | ORAL_TABLET | Freq: Two times a day (BID) | ORAL | Status: DC
  Administered 2013-12-18 – 2013-12-20 (×6): 1200 mg via ORAL
  Filled 2013-12-18 (×6): qty 2

## 2013-12-18 MED ORDER — GUAIFENESIN-DM 100-10 MG/5ML PO SYRP: 5.0000 mL | ORAL_SOLUTION | ORAL | Status: DC | PRN

## 2013-12-18 MED ORDER — ONDANSETRON HCL 4 MG PO TABS: 4.0000 mg | ORAL_TABLET | Freq: Four times a day (QID) | ORAL | Status: DC | PRN

## 2013-12-18 MED ORDER — VITAMIN E 45 MG (100 UNIT) PO CAPS
1000.0000 [IU] | ORAL_CAPSULE | Freq: Every day | ORAL | Status: DC
  Administered 2013-12-19 – 2013-12-22 (×4): 1000 [IU] via ORAL
  Filled 2013-12-18 (×9): qty 2

## 2013-12-18 MED ORDER — LEVOFLOXACIN IN D5W 750 MG/150ML IV SOLN
750.0000 mg | INTRAVENOUS | Status: DC
  Administered 2013-12-18 – 2013-12-20 (×3): 750 mg via INTRAVENOUS
  Filled 2013-12-18 (×5): qty 150

## 2013-12-18 NOTE — ED Notes (Signed)
Reports having asthma and wearing O2 prn at home. Recently past 2 days have worn O2 continuously. Increase of sob per pt.

## 2013-12-18 NOTE — ED Provider Notes (Signed)
CSN: [id number]     Arrival date & time 12/18/13  0740 History  This chart was scribed for Janice Norrie, MD by Zettie Pho, ED Scribe. This patient was seen in room [facility]14[facility] and the patient's care was started at 8:23 AM.    Chief Complaint  Patient presents with  . Shortness of Breath   The history is provided by the patient. No language interpreter was used.   HPI Comments: Joanna Perez is a 58 y.o. Female with a history of asthma and COPD who presents to the Emergency Department complaining of shortness of breath that has been progressively worsening over the past 2 days. Patient wears 4 L of oxygen at home and states that she attempted to wean herself down to 3 L, but that this caused her symptoms to worsen. She reports an associated productive cough with clear and brown sputum and some post-tussive emesis. Patient reports some mild chest pain secondary to the cough. She reports some associated rhinorrhea (clear and bloody sputum) with sore throat. Patient was seen here on 11/30/2013 for "too much oxygen in my blood" and was admitted and was started on a prednisone taper, was on  5 mg for 3 days and started the half tablet dosage yesterday. Patient also states that she was taken off of Phenergan and flexeril at that time due to it causing excessive drowsiness. She denies fever, diarrhea. Patient also has a history of HTN, hyperlipidemia, DM, A-fib, MI, and pericardial effusion. Patient takes Lasix 40 mg twice daily. She states she has not had a cigarette in 2 weeks, but used to smoke 1.5-2 packs daily.   PCP Dr Gerarda Fraction Pulmonary Dr Jabier Mutton at [facility]  Past Medical History  Diagnosis Date  . Asthma   . COPD (chronic obstructive pulmonary disease)   . Hypothyroidism   . Essential hypertension, benign   . Hyperlipidemia   . Type 2 diabetes mellitus   . Atrial fibrillation   . MI (myocardial infarction)     2012  . Pericardial effusion 09/09/2013   Past Surgical History  Procedure  Laterality Date  . Hernia repair    . Abdominal hysterectomy    . Stomach surgery      Wound vac currently in place  . Esophagogastroduodenoscopy  11/11/2011    Procedure: ESOPHAGOGASTRODUODENOSCOPY (EGD);  Surgeon: Rogene Houston, MD;  Location: [facility];  Service: Endoscopy;  Laterality: N/A;  . Foreign body removal  11/11/2011    Procedure: FOREIGN BODY REMOVAL;  Surgeon: Rogene Houston, MD;  Location: [facility];  Service: Endoscopy;  Laterality: N/A;  . Tracheal surgery    . Abdominal surgery     Family History  Problem Relation Age of Onset  . Diabetes Mother    History  Substance Use Topics  . Smoking status: Current Some Day Smoker -- 1.50 packs/day for 35 years    Types: Cigarettes  . Smokeless tobacco: Never Used  . Alcohol Use: Yes     Comment: 1-2 beers nightly   Lives at home Was smoking 1.5-2 ppd, quit smoking 2 week ago Oxygen 4 lpm [place] trying to taper to 3 lpm [place]   OB History   Grav Para Term Preterm Abortions TAB SAB Ect Mult Living   2 2 2       1      Review of Systems  Constitutional: Negative for fever.  HENT: Positive for rhinorrhea and sore throat.   Respiratory: Positive for cough and shortness of breath.  Cardiovascular: Positive for chest pain.  Gastrointestinal: Positive for vomiting (post-tussive). Negative for diarrhea.    Allergies  Amoxicillin; Bactrim; Erythromycin; Keflex; and Penicillins  Home Medications   Current Outpatient Rx  Name  Route  Sig  Dispense  Refill  . albuterol (PROVENTIL HFA;VENTOLIN HFA) 108 (90 BASE) MCG/ACT inhaler   Inhalation   Inhale 1 puff into the lungs every 4 (four) hours as needed for wheezing or shortness of breath.         Marland Kitchen albuterol-ipratropium (COMBIVENT) 18-103 MCG/ACT inhaler   Inhalation   Inhale 2 puffs into the lungs every 6 (six) hours as needed for wheezing or shortness of breath.         . ALPRAZolam (XANAX) 1 MG tablet   Oral   Take 0.5 tablets (0.5 mg total) by mouth at  bedtime.   1 tablet   0   . apixaban (ELIQUIS) 5 MG TABS tablet   Oral   Take 1 tablet (5 mg total) by mouth 2 (two) times daily.   60 tablet   6   . Ascorbic Acid (VITAMIN C) 1000 MG tablet   Oral   Take 1,000 mg by mouth daily.          . bisacodyl (DULCOLAX) 5 MG EC tablet   Oral   Take 5 mg by mouth daily as needed for moderate constipation.         . budesonide-formoterol (SYMBICORT) 160-4.5 MCG/ACT inhaler   Inhalation   Inhale 2 puffs into the lungs.          . budesonide-formoterol (SYMBICORT) 80-4.5 MCG/ACT inhaler   Inhalation   Inhale 2 puffs into the lungs 2 (two) times daily.           . citalopram (CELEXA) 10 MG tablet   Oral   Take 10 mg by mouth daily.           Marland Kitchen diltiazem (CARDIZEM CD) 240 MG 24 hr capsule   Oral   Take 1 capsule (240 mg total) by mouth daily.   30 capsule   6   . fluticasone (FLONASE) 50 MCG/ACT nasal spray   Each Nare   Place 1 spray into both nostrils.          . furosemide (LASIX) 20 MG tablet   Oral   Take 40 mg by mouth 2 (two) times daily.          Marland Kitchen gabapentin (NEURONTIN) 100 MG capsule   Oral   Take 100 mg by mouth 3 (three) times daily.          Marland Kitchen guaiFENesin (MUCINEX) 600 MG 12 hr tablet   Oral   Take 600 mg by mouth 2 (two) times daily.         Marland Kitchen HYDROcodone-acetaminophen (NORCO) 10-325 MG per tablet   Oral   Take 1 tablet by mouth every 12 (twelve) hours as needed for severe pain.   30 tablet   0   . levofloxacin (LEVAQUIN) 750 MG tablet   Oral   Take 1 tablet (750 mg total) by mouth daily.   4 tablet   0   . levothyroxine (SYNTHROID, LEVOTHROID) 112 MCG tablet   Oral   Take 112 mcg by mouth daily before breakfast.          . pantoprazole (PROTONIX) 40 MG tablet   Oral   Take 40 mg by mouth daily.          . predniSONE (DELTASONE) 5  MG tablet      Label  & dispense according to the schedule below. 10 Pills PO for 3 days then, 8 Pills PO for 3 days, 6 Pills PO for 3 days, 4  Pills PO for 3 days, 2 Pills PO for 3 days, 1 Pills PO for 3 days, 1/2 Pill  PO for 3 days then STOP. Total 95 pills.   95 tablet   0     Dispense as written.   . ramipril (ALTACE) 10 MG capsule   Oral   Take 10 mg by mouth daily.         . simvastatin (ZOCOR) 40 MG tablet   Oral   Take 40 mg by mouth daily at 6 PM.          . theophylline (THEO-24) 200 MG 24 hr capsule      400 mg 2 (two) times daily.           Triage Vitals: BP 106/55  Pulse 114  Temp(Src) 98 F (36.7 C) (Oral)  Resp 30  SpO2 89%  Vital signs normal hypoxia on initial arrival to the ED on 4 L per minute nasal cannula   Physical Exam  Nursing note and vitals reviewed. Constitutional: She is oriented to person, place, and time. She appears well-developed and well-nourished.  Non-toxic appearance. She does not appear ill. No distress.  HENT:  Head: Normocephalic and atraumatic.  Right Ear: External ear normal.  Left Ear: External ear normal.  Nose: Nose normal. No mucosal edema or rhinorrhea.  Mouth/Throat: Oropharynx is clear and moist and mucous membranes are normal. No dental abscesses or uvula swelling.  Eyes: Conjunctivae and EOM are normal. Pupils are equal, round, and reactive to light.  Neck: Normal range of motion and full passive range of motion without pain. Neck supple.  Cardiovascular: Normal rate, regular rhythm and normal heart sounds.  Exam reveals no gallop and no friction rub.   No murmur heard. Pulmonary/Chest: Accessory muscle usage present. Tachypnea noted. She is in respiratory distress. She has decreased breath sounds. She has wheezes. She has rhonchi. She has rales. She exhibits no tenderness and no crepitus.  Diffuse, expiratory wheezing with some rhonchi, sometimes audible.  Rattling cough and rales, sometimes audible. Tachypnea. Retractions.   Abdominal: Soft. Normal appearance and bowel sounds are normal. She exhibits no distension. There is no tenderness. There is no  rebound and no guarding.  Musculoskeletal: Normal range of motion. She exhibits no edema and no tenderness.  Moves all extremities well.   Neurological: She is alert and oriented to person, place, and time. She has normal strength. No cranial nerve deficit.  Skin: Skin is warm, dry and intact. No rash noted. No erythema. No pallor.  Psychiatric: She has a normal mood and affect. Her speech is normal and behavior is normal. Her mood appears not anxious.    ED Course  Procedures (including critical care time)  Medications  methylPREDNISolone sodium succinate (SOLU-MEDROL) 125 mg/2 mL injection 125 mg (not administered)  albuterol (PROVENTIL,VENTOLIN) solution continuous neb (15 mg/hr Nebulization Given 12/18/13 0838)  ipratropium (ATROVENT) nebulizer solution 0.5 mg (0.5 mg Nebulization Given 12/18/13 0838)  furosemide (LASIX) injection 80 mg (80 mg Intravenous Given 12/18/13 0909)   DIAGNOSTIC STUDIES: Oxygen Saturation is 89% on 4 lpm [place], low by my interpretation.    COORDINATION OF CARE: 8:31 AM- Discussed that x-ray results do not indicate pneumonia, but may be indicative for some CHF. Ordered blood labs and an EKG.  Ordered a breathing treatment, IV fluids, and Lasix to manage symptoms. Discussed treatment plan with patient at bedside and patient verbalized agreement.   Patient's pulse ox improved on her baseline 4 L per minute nasal cannula after she was placed in a ED stretcher. She was initially hypoxic in triage.  Pt given lasix 80 mg IV followed by asymptomatic hypotension. She states she is on 40 mg BID.   10:22 AM- Patient reports feeling a bit better after receiving the treatment, but states that she feels like she is still wheezing some. Air movement has improved, but some rattling breath sounds and diffuse wheezes heard. Less audible wheezing, still has some audible rales.  Discussed that chest x-ray was negative for pneumonia and that her labs did not have any unexpected  findings. Discussed possible admission with patient at bedside and patient verbalized agreement.   11:10 Dr Karleen Hampshire, admit to stepdown, team 2  Results for orders placed during the hospital encounter of 12/18/13  CBC WITH DIFFERENTIAL      Result Value Range   WBC 14.5 (*) 4.0 - 10.5 K/uL   RBC 4.95  3.87 - 5.11 MIL/uL   Hemoglobin 12.6  12.0 - 15.0 g/dL   HCT 39.7  36.0 - 46.0 %   MCV 80.2  78.0 - 100.0 fL   MCH 25.5 (*) 26.0 - 34.0 pg   MCHC 31.7  30.0 - 36.0 g/dL   RDW 17.1 (*) 11.5 - 15.5 %   Platelets 116 (*) 150 - 400 K/uL   Neutrophils Relative % 87 (*) 43 - 77 %   Neutro Abs 12.6 (*) 1.7 - 7.7 K/uL   Lymphocytes Relative 9 (*) 12 - 46 %   Lymphs Abs 1.2  0.7 - 4.0 K/uL   Monocytes Relative 4  3 - 12 %   Monocytes Absolute 0.6  0.1 - 1.0 K/uL   Eosinophils Relative 0  0 - 5 %   Eosinophils Absolute 0.1  0.0 - 0.7 K/uL   Basophils Relative 0  0 - 1 %   Basophils Absolute 0.0  0.0 - 0.1 K/uL  COMPREHENSIVE METABOLIC PANEL      Result Value Range   Sodium 138  137 - 147 mEq/L   Potassium 3.9  3.7 - 5.3 mEq/L   Chloride 95 (*) 96 - 112 mEq/L   CO2 32  19 - 32 mEq/L   Glucose, Bld 179 (*) 70 - 99 mg/dL   BUN 16  6 - 23 mg/dL   Creatinine, Ser 0.92  0.50 - 1.10 mg/dL   Calcium 9.1  8.4 - 10.5 mg/dL   Total Protein 6.5  6.0 - 8.3 g/dL   Albumin 2.6 (*) 3.5 - 5.2 g/dL   AST 10  0 - 37 U/L   ALT 10  0 - 35 U/L   Alkaline Phosphatase 78  39 - 117 U/L   Total Bilirubin 0.4  0.3 - 1.2 mg/dL   GFR calc non Af Amer 68 (*) >90 mL/min   GFR calc Af Amer 79 (*) >90 mL/min  PRO B NATRIURETIC PEPTIDE      Result Value Range   Pro B Natriuretic peptide (BNP) 258.8 (*) 0 - 125 pg/mL  TROPONIN I      Result Value Range   Troponin I <0.30  <0.30 ng/mL   Laboratory interpretation all normal except leukocytosis consistent with being on steroids, hyperglycemia which is stable, mild elevation of her BNP which is lower than her most recent  ones, new thrombocytopenia   Dg Chest Portable  1 View  12/18/2013   CLINICAL DATA:  Shortness of breath, hypoxia  EXAM: PORTABLE CHEST - 1 VIEW  COMPARISON:  11/27/2013  FINDINGS: Increased interstitial markings, favored to reflect mild interstitial edema. Possible small left pleural effusion. No pneumothorax.  The heart is normal in size.  IMPRESSION: Suspected mild interstitial edema.  Possible small left pleural effusion.   Electronically Signed   By: Julian Hy M.D.   On: 12/18/2013 08:21   EKG Interpretation    Date/Time:  Monday December 18 2013 07:52:25 EST Ventricular Rate:  106 PR Interval:  144 QRS Duration: 64 QT Interval:  302 QTC Calculation: 401 R Axis:   57 Text Interpretation:  Sinus tachycardia Otherwise normal ECG When compared with ECG of 27-Nov-2013 06:49, Nonspecific T wave abnormality, improved in Lateral leads Confirmed by Reet Scharrer  MD-I, Jasalyn Frysinger ([id number]) on 12/18/2013 8:36:05 AM            MDM   1. COPD exacerbation   2. Hypoxia    Plan admission   CRITICAL CARE Performed by: Rolland Porter L Total critical care time: 32 min Critical care time was exclusive of separately billable procedures and treating other patients. Critical care was necessary to treat or prevent imminent or life-threatening deterioration. Critical care was time spent personally by me on the following activities: development of treatment plan with patient and/or surrogate as well as nursing, discussions with consultants, evaluation of patient's response to treatment, examination of patient, obtaining history from patient or surrogate, ordering and performing treatments and interventions, ordering and review of laboratory studies, ordering and review of radiographic studies, pulse oximetry and re-evaluation of patient's condition.  I personally performed the services described in this documentation, which was scribed in my presence. The recorded information has been reviewed and considered.  Rolland Porter, MD, Abram Sander     Janice Norrie,  MD 12/18/13 1124

## 2013-12-18 NOTE — H&P (Signed)
History and Physical       Hospital Admission Note Date: 12/18/2013  Patient name: Joanna Perez Medical record number: [id number] Date of birth: 11/15/1956 Age: 58 y.o. Gender: female  PCP: Glo Herring., MD    Chief Complaint:  Shortness of breath  HPI: Patient is a 58 year old female with history of asthma/COPD on 4 L O2 via nasal cannula at home, chronic diastolic CHF, chronic atrial fibrillation, diabetes type 2, chronic pain syndrome, anxiety on narcotics presented to the ER with worsening shortness of breath, wheezing, coughing for the last 2 days. Patient was recently discharged on 11/30/13 after having COPD exacerbation. Patient reports that she was going better until 2 days ago when she started having more shortness of breath and coughing. She also reports chronic abdominal pain and back pain. Patient reports that she was doing better on 4 L O2 via nasal cannula however once she was weaned down on her steroids and O2 to 3 L, her symptoms got worse. She has also stopped smoking since the previous admission.  Review of Systems:  Constitutional: Denies fever, chills, diaphoresis, + poor appetite and fatigue.  HEENT: Denies photophobia, eye pain, redness, hearing loss, ear pain, congestion, sore throat, rhinorrhea, sneezing, mouth sores, trouble swallowing, neck pain, neck stiffness and tinnitus.   Respiratory: Please see history of present illness Cardiovascular: Denies chest pain, palpitations and leg swelling.  Gastrointestinal: Denies nausea, vomiting, diarrhea, constipation, blood in stool and abdominal distention. + chronic abdominal pain Genitourinary: Denies dysuria, urgency, frequency, hematuria, flank pain and difficulty urinating.  Musculoskeletal: Denies myalgias, + chronic  back pain,denies  joint swelling, arthralgias and gait problem.  Skin: Denies pallor, rash and wound.  Neurological: Denies dizziness,  seizures, syncope, weakness, light-headedness, numbness and headaches.  Hematological: Denies adenopathy. Easy bruising, personal or family bleeding history  Psychiatric/Behavioral: Denies suicidal ideation, mood changes, confusion, nervousness, sleep disturbance and agitation  Past Medical History: Past Medical History  Diagnosis Date  . Asthma   . COPD (chronic obstructive pulmonary disease)   . Hypothyroidism   . Essential hypertension, benign   . Hyperlipidemia   . Type 2 diabetes mellitus   . Atrial fibrillation   . MI (myocardial infarction)     2012  . Pericardial effusion 09/09/2013   Past Surgical History  Procedure Laterality Date  . Hernia repair    . Abdominal hysterectomy    . Stomach surgery      Wound vac currently in place  . Esophagogastroduodenoscopy  11/11/2011    Procedure: ESOPHAGOGASTRODUODENOSCOPY (EGD);  Surgeon: Rogene Houston, MD;  Location: [facility];  Service: Endoscopy;  Laterality: N/A;  . Foreign body removal  11/11/2011    Procedure: FOREIGN BODY REMOVAL;  Surgeon: Rogene Houston, MD;  Location: [facility];  Service: Endoscopy;  Laterality: N/A;  . Tracheal surgery    . Abdominal surgery      Medications: Prior to Admission medications   Medication Sig Start Date End Date Taking? Authorizing Provider  albuterol (PROVENTIL HFA;VENTOLIN HFA) 108 (90 BASE) MCG/ACT inhaler Inhale 1 puff into the lungs every 4 (four) hours as needed for wheezing or shortness of breath.   Yes Historical Provider, MD  albuterol (PROVENTIL) 2 MG tablet Take 1 mg by mouth 3 (three) times daily.   Yes Historical Provider, MD  albuterol-ipratropium (COMBIVENT) 18-103 MCG/ACT inhaler Inhale 2 puffs into the lungs every 6 (six) hours as needed for wheezing or shortness of breath.   Yes Historical Provider, MD  ALPRAZolam Duanne Moron)  1 MG tablet Take 0.5 tablets (0.5 mg total) by mouth at bedtime. 11/30/13  Yes Thurnell Lose, MD  apixaban (ELIQUIS) 5 MG TABS tablet Take  1 tablet (5 mg total) by mouth 2 (two) times daily. 09/29/13  Yes Lendon Colonel, NP  Ascorbic Acid (VITAMIN C) 1000 MG tablet Take 1,000 mg by mouth daily.    Yes Historical Provider, MD  bisacodyl (DULCOLAX) 5 MG EC tablet Take 5 mg by mouth daily as needed for moderate constipation.   Yes Historical Provider, MD  budesonide-formoterol (SYMBICORT) 160-4.5 MCG/ACT inhaler Inhale 2 puffs into the lungs.    Yes Historical Provider, MD  citalopram (CELEXA) 10 MG tablet Take 10 mg by mouth daily.     Yes Historical Provider, MD  diltiazem (CARDIZEM CD) 240 MG 24 hr capsule Take 1 capsule (240 mg total) by mouth daily. 09/29/13  Yes Lendon Colonel, NP  fexofenadine (ALLEGRA) 180 MG tablet Take 180 mg by mouth 2 (two) times daily.   Yes Historical Provider, MD  fluticasone (FLONASE) 50 MCG/ACT nasal spray Place 1 spray into both nostrils.    Yes Historical Provider, MD  furosemide (LASIX) 20 MG tablet Take 40 mg by mouth 2 (two) times daily.    Yes Historical Provider, MD  gabapentin (NEURONTIN) 100 MG capsule Take 100 mg by mouth 3 (three) times daily.  08/15/13 08/15/14 Yes Historical Provider, MD  guaiFENesin (MUCINEX) 600 MG 12 hr tablet Take 600 mg by mouth 2 (two) times daily as needed for cough.    Yes Historical Provider, MD  HYDROcodone-acetaminophen (NORCO) 10-325 MG per tablet Take 1 tablet by mouth every 12 (twelve) hours as needed for severe pain. 11/30/13  Yes Thurnell Lose, MD  levothyroxine (SYNTHROID, LEVOTHROID) 112 MCG tablet Take 112 mcg by mouth daily before breakfast.    Yes Historical Provider, MD  pantoprazole (PROTONIX) 40 MG tablet Take 40 mg by mouth daily.    Yes Historical Provider, MD  predniSONE (DELTASONE) 5 MG tablet Label  & dispense according to the schedule below. 10 Pills PO for 3 days then, 8 Pills PO for 3 days, 6 Pills PO for 3 days, 4 Pills PO for 3 days, 2 Pills PO for 3 days, 1 Pills PO for 3 days, 1/2 Pill  PO for 3 days then STOP. Total 95 pills. 11/30/13  Yes  Thurnell Lose, MD  ramipril (ALTACE) 10 MG capsule Take 10 mg by mouth daily.   Yes Historical Provider, MD  simvastatin (ZOCOR) 40 MG tablet Take 40 mg by mouth daily at 6 PM.    Yes Historical Provider, MD  theophylline (THEO-24) 200 MG 24 hr capsule 400 mg 2 (two) times daily.    Yes Historical Provider, MD  vitamin E 1000 UNIT capsule Take 1,000 Units by mouth daily.   Yes Historical Provider, MD    Allergies:   Allergies  Allergen Reactions  . Amoxicillin Hives  . Bactrim [Sulfamethoxazole-Tmp Ds] Hives  . Erythromycin Hives  . Keflex [Cephalexin] Hives  . Penicillins Itching and Swelling    Sweating    Social History:  reports that she has been smoking Cigarettes.  She has a 52.5 pack-year smoking history. She has never used smokeless tobacco. She reports that she drinks alcohol. She reports that she does not use illicit drugs.  Family History: Family History  Problem Relation Age of Onset  . Diabetes Mother     Physical Exam: Blood pressure 101/48, pulse 103, temperature 98 F (36.7  C), temperature source Oral, resp. rate 22, SpO2 94.00%. General: Alert, awake, oriented x3, in no acute distress. HEENT: normocephalic, atraumatic, anicteric sclera, pink conjunctiva, pupils equal and reactive to light and accomodation, oropharynx clear Neck: supple, no masses or lymphadenopathy, no goiter, no bruits  Heart:  tachycardia, regular rate and rhythm  Lungs:  bibasal crackles with diffuse rhonchi  Abdomen:Obese  Soft, tender (patient reports baseline chronic abdominal tenderness and pain), nondistended, positive bowel sounds, no masses. Extremities: No clubbing, cyanosis or edema with positive pedal pulses. Neuro: Grossly intact, no focal neurological deficits, strength 5/5 upper and lower extremities bilaterally Psych: alert and oriented x 3, normal mood and affect Skin: no rashes or lesions, warm and dry   LABS on Admission:  Basic Metabolic Panel:  Recent Labs Lab  12/18/13 0838  NA 138  K 3.9  CL 95*  CO2 32  GLUCOSE 179*  BUN 16  CREATININE 0.92  CALCIUM 9.1   Liver Function Tests:  Recent Labs Lab 12/18/13 0838  AST 10  ALT 10  ALKPHOS 78  BILITOT 0.4  PROT 6.5  ALBUMIN 2.6*   No results found for this basename: LIPASE, AMYLASE,  in the last 168 hours No results found for this basename: AMMONIA,  in the last 168 hours CBC:  Recent Labs Lab 12/18/13 0838  WBC 14.5*  NEUTROABS 12.6*  HGB 12.6  HCT 39.7  MCV 80.2  PLT 116*   Cardiac Enzymes:  Recent Labs Lab 12/18/13 0838  TROPONINI <0.30   BNP: No components found with this basename: POCBNP,  CBG: No results found for this basename: GLUCAP,  in the last 168 hours   Radiological Exams on Admission: Dg Chest 2 View  11/27/2013   CLINICAL DATA:  Shortness of breath, chest pain, wheezing and cough.  EXAM: CHEST - 2 VIEW  COMPARISON:  Film earlier today at 0716 hr  FINDINGS: The heart is enlarged. There remains evidence of interstitial prominence and pulmonary venous prominence likely consistent with mild interstitial edema. Small bilateral pleural effusions are present in the lateral projection. No focal airspace consolidation is identified.  IMPRESSION: Cardiomegaly and probable mild interstitial edema. Small bilateral pleural effusions are present.   Electronically Signed   By: Aletta Edouard M.D.   On: 11/27/2013 09:53   Dg Chest Portable 1 View  12/18/2013   CLINICAL DATA:  Shortness of breath, hypoxia  EXAM: PORTABLE CHEST - 1 VIEW  COMPARISON:  11/27/2013  FINDINGS: Increased interstitial markings, favored to reflect mild interstitial edema. Possible small left pleural effusion. No pneumothorax.  The heart is normal in size.  IMPRESSION: Suspected mild interstitial edema.  Possible small left pleural effusion.   Electronically Signed   By: Julian Hy M.D.   On: 12/18/2013 08:21   Dg Chest Portable 1 View  11/27/2013   CLINICAL DATA:  Midsternal chest pain.   EXAM: PORTABLE CHEST - 1 VIEW  COMPARISON:  Chest x-ray 11/03/2013.  FINDINGS: Lung volumes are slightly low. Film is underpenetrated, reducing the diagnostic sensitivity and specificity of the examination. With these limitations in mind, there are some bibasilar opacities (left greater than right), favored to reflect subsegmental atelectasis. Underlying airspace consolidation in the left lower lobe is difficult to entirely exclude on this limited examination. In addition, there may be a small left pleural effusion, however, this is favored to be technique related. Cephalization of the pulmonary vasculature, without frank pulmonary edema. Heart size appears borderline enlarged, likely accentuated by patient's rotation to the left which  also distorts upper mediastinal contours. Atherosclerosis in the thoracic aorta.  IMPRESSION: 1. Borderline cardiomegaly with pulmonary venous congestion, but no frank pulmonary edema. 2. Probable bibasilar subsegmental atelectasis. The possibility of airspace consolidation in the left lower lobe and/or a small left pleural effusion is not excluded. 3. Atherosclerosis.   Electronically Signed   By: Vinnie Langton M.D.   On: 11/27/2013 07:36    Assessment/Plan Principal Problem:   Acute respiratory failure likely secondary to COPD/asthma exacerbation with mild CHF exacerbation - Admit to step down unit overnight, she had required BiPAP during the previous admission. Obtain ABG, serial troponins - Patient was given one dose of IV Lasix and Solu-Medrol in the ER - Will continue oxygen supplementation at 4 L, scheduled bronchodilators, Levaquin, IV Solu-Medrol, Pulmicort  Active Problems:   HYPOTHYROIDISM - continue Synthroid    Type 2 diabetes mellitus -Placed on sliding scale insulin     Essential hypertension, benign - Currently BP borderline, hold lisinopril      mild CHF exacerbation - Patient was given IV Lasix, will continue IV Lasix for diuresis  Atrial  fibrillation - Continue Cardizem, eliquis  DVT prophylaxis: Currently on eliquis   CODE STATUS: Full code   Family Communication: Admission, patients condition and plan of care including tests being ordered have been discussed with the patient and  family who indicates understanding and agree with the plan and Code Status   Further plan will depend as patient's clinical course evolves and further radiologic and laboratory data become available.   Time Spent on Admission: 1 hour  RAI,RIPUDEEP M.D. [facility] 12/18/2013, 11:41 AM Pager: [phone]  If 7PM-7AM, please contact night-coverage [email or web address] Password [email or web address]

## 2013-12-19 DIAGNOSIS — I10 Essential (primary) hypertension: Secondary | ICD-10-CM

## 2013-12-19 LAB — GLUCOSE, CAPILLARY
GLUCOSE-CAPILLARY: 165 mg/dL — AB (ref 70–99)
Glucose-Capillary: 182 mg/dL — ABNORMAL HIGH (ref 70–99)
Glucose-Capillary: 148 mg/dL — ABNORMAL HIGH (ref 70–99)
Glucose-Capillary: 156 mg/dL — ABNORMAL HIGH (ref 70–99)

## 2013-12-19 LAB — BASIC METABOLIC PANEL
BUN: 19 mg/dL (ref 6–23)
CHLORIDE: 92 meq/L — AB (ref 96–112)
CO2: 34 mEq/L — ABNORMAL HIGH (ref 19–32)
CREATININE: 0.66 mg/dL (ref 0.50–1.10)
Calcium: 9.8 mg/dL (ref 8.4–10.5)
GFR calc Af Amer: >90 mL/min (ref >90)
GFR calc non Af Amer: >90 mL/min (ref >90)
GLUCOSE: 237 mg/dL — AB (ref 70–99)
Potassium: 3.5 mEq/L — ABNORMAL LOW (ref 3.7–5.3)
Sodium: 140 mEq/L (ref 137–147)

## 2013-12-19 LAB — CBC
HEMATOCRIT: 41 % (ref 36.0–46.0)
Hemoglobin: 13 g/dL (ref 12.0–15.0)
MCH: 25.1 pg — AB (ref 26.0–34.0)
MCHC: 31.7 g/dL (ref 30.0–36.0)
MCV: 79.3 fL (ref 78.0–100.0)
Platelets: 177 10*3/uL (ref 150–400)
RBC: 5.17 MIL/uL — ABNORMAL HIGH (ref 3.87–5.11)
RDW: 16.9 % — AB (ref 11.5–15.5)
WBC: 7.5 10*3/uL (ref 4.0–10.5)

## 2013-12-19 LAB — HEMOGLOBIN A1C
HEMOGLOBIN A1C: 6.9 % — AB (ref <5.7)
Mean Plasma Glucose: 151 mg/dL — ABNORMAL HIGH (ref <117)

## 2013-12-19 LAB — URINE CULTURE
CULTURE: NO GROWTH
Colony Count: NO GROWTH

## 2013-12-19 LAB — TROPONIN I

## 2013-12-19 LAB — THEOPHYLLINE LEVEL: THEOPHYLLINE LVL: 13.4 ug/mL (ref 10.0–20.0)

## 2013-12-19 MED ORDER — APIXABAN 5 MG PO TABS
ORAL_TABLET | ORAL | Status: AC
  Filled 2013-12-19: qty 1

## 2013-12-19 MED ORDER — INSULIN ASPART 100 UNIT/ML ~~LOC~~ SOLN
2.0000 [IU] | Freq: Three times a day (TID) | SUBCUTANEOUS | Status: DC
  Administered 2013-12-19 – 2013-12-20 (×4): 2 [IU] via SUBCUTANEOUS

## 2013-12-19 MED ORDER — POTASSIUM CHLORIDE CRYS ER 20 MEQ PO TBCR
40.0000 meq | EXTENDED_RELEASE_TABLET | Freq: Two times a day (BID) | ORAL | Status: AC
  Administered 2013-12-19 (×2): 40 meq via ORAL
  Filled 2013-12-19 (×2): qty 2

## 2013-12-19 MED ORDER — RAMIPRIL 10 MG PO CAPS
10.0000 mg | ORAL_CAPSULE | Freq: Every day | ORAL | Status: DC
  Administered 2013-12-20 – 2013-12-22 (×3): 10 mg via ORAL
  Filled 2013-12-19 (×3): qty 1

## 2013-12-19 NOTE — Evaluation (Signed)
Occupational Therapy Evaluation Patient Details Name: Joanna Perez MRN: [id number] DOB: [birth year] Today's Date: 12/19/2013 Time: 0175-1025 OT Time Calculation (min): 20 min Evaluation 0820-0840 (68')  OT Assessment / Plan / Recommendation History of present illness Pt with multiple medical problems including CHF, Afib, DM and chronic pain/anxiety is admitted with exacerbation of COPD.  She is O2 dependent at home and still smokes per chart .  She lives alone with close family support and has [place] x3 days/week    Clinical Impression   Patient demos ability to perform ADL tasks this date at slow pace and verbalized energy conservation tech she incorporates at home to maximize her independence and safety.  She verbalizes and demos being at baseline for ADL tasks during this time.  Recommend no skilled OT services at this time.     OT Assessment  Patient does not need any further OT services    Follow Up Recommendations  No OT follow up    Barriers to Discharge  lives alone    Equipment Recommendations       Recommendations for Other Services    Frequency       Precautions / Restrictions Precautions Precautions: Other (comment) Precaution Comments: respitary 4L./min Restrictions Weight Bearing Restrictions: No       ADL  Eating/Feeding: Modified independent Where Assessed - Eating/Feeding: Chair Grooming: Modified independent Where Assessed - Grooming: Unsupported sitting Lower Body Dressing: Modified independent Where Assessed - Lower Body Dressing: Unsupported sitting Toilet Transfer: Modified independent Toilet Transfer Equipment: Comfort height toilet     OT Goals(Current goals can be found in the care plan section) Acute Rehab OT Goals Patient Stated Goal: to go home   Visit Information  History of Present Illness: Pt with multiple medical problems including CHF, Afib, DM and chronic pain/anxiety is admitted with exacerbation of COPD.  She is O2  dependent at home and still smokes per chart .  She lives alone with close family support and has [place] x3 days/week        Prior [place] expects to be discharged to:: Private residence Living Arrangements: Alone (reports spouse checks in on her and assists as needed as well as her daughter ) Available Help at Discharge: Family;Available PRN/intermittently Type of Home: House Home Access: Ramped entrance Home Layout: One level Home Equipment:  (reports son is planning on installing grab bars in bathroom around tub and toilet for incrased safety ) Prior Function Level of Independence: Independent with assistive device(s) Communication Communication: No difficulties Dominant Hand: Right         Vision/Perception Vision - History Patient Visual Report: No change from baseline Vision - Assessment Eye Alignment: Within Functional Limits   Cognition  Cognition Arousal/Alertness: Awake/alert Behavior During Therapy: WFL for tasks assessed/performed Overall Cognitive Status: Within Functional Limits for tasks assessed    Extremity/Trunk Assessment Upper Extremity Assessment Upper Extremity Assessment: Overall WFL for tasks assessed Lower Extremity Assessment Lower Extremity Assessment: Defer to PT evaluation Cervical / Trunk Assessment Cervical / Trunk Assessment: Kyphotic     Mobility Bed Mobility Overal bed mobility: Modified Independent Transfers Overall transfer level: Modified independent     Exercise     Balance Balance Overall balance assessment: Modified Independent   End of Session OT - End of Session Activity Tolerance: Patient tolerated treatment well Patient left: in chair;with call bell/phone within reach;with nursing/sitter in room  [place], OTR/L  12/19/2013,  11:33 AM

## 2013-12-19 NOTE — Progress Notes (Signed)
PT TRANSFERRING TO ROOM 202 AS MED/SURG PT. TRANSFER REPORT GIVEN C. ALSTON RN  ON 200. PT ALERT AND ORIENTED.02 AT 3.5L/MIN. DIMINSHED BREATH SOUNDS. RT HAND NSL PATENT. PT GET SOB W/ MODERATE EXERTION. SKIN WARM DRY.

## 2013-12-19 NOTE — Progress Notes (Signed)
UR chart review completed.  

## 2013-12-19 NOTE — Progress Notes (Signed)
PT Cancellation Note  Patient Details Name: Joanna Perez MRN: [id number] DOB: June 22, 1956   Cancelled Treatment:    Reason Eval/Treat Not Completed: PT screened, no needs identified, will sign off   Sable Feil 12/19/2013, 10:01 AM

## 2013-12-19 NOTE — Care Management Note (Signed)
    Page 1 of 1   12/19/2013     2:08:36 PM   CARE MANAGEMENT NOTE 12/19/2013  Patient:  Joanna Perez, Joanna Perez   Account Number:  [id number]  Date Initiated:  12/19/2013  Documentation initiated by:  Theophilus Kinds  Subjective/Objective Assessment:   Pt admitted from home with COPD. Pt lives with her husband and will return home at discharge. Pt has home O2 with [facility] and a RN with [facility]. Pt is trying to get her daughter in law as her CAP aide.     Action/Plan:   Will arrange resumption of [facility] at discharge.   Anticipated DC Date:  12/22/2013   Anticipated DC Plan:  [place]  CM consult      [facility] Choice  Resumption Of Svcs/PTA Provider   Choice offered to / List presented to:  C-1 Patient        [place] arranged  HH-1 RN      [place].   Status of service:  Completed, signed off Medicare Important Message given?   (If response is "NO", the following Medicare IM given date fields will be blank) Date Medicare IM given:   Date Additional Medicare IM given:    Discharge Disposition:  [place]  Per UR Regulation:    If discussed at Long Length of Stay Meetings, dates discussed:    Comments:  12/19/13 [place], RN BSN CM

## 2013-12-19 NOTE — Progress Notes (Signed)
[facility] PROGRESS NOTE  Joanna Perez [id number] DOB: July 31, 1956 DOA: 12/18/2013 PCP: Glo Herring., MD  Brief HPI: Patient is a 58 year old female with history of asthma/COPD on 4 L O2 via nasal cannula at home, chronic diastolic CHF, chronic atrial fibrillation, diabetes type 2, chronic pain syndrome, anxiety on narcotics presented to the ER with worsening shortness of breath, wheezing, coughing for the last 2 days.  Patient was recently discharged on 11/30/13 after having COPD exacerbation. Patient reports that she was going better until 2 days ago when she started having more shortness of breath and coughing. She also reports chronic abdominal pain and back pain. Patient reports that she was doing better on 4 L O2 via nasal cannula however once she was weaned down on her steroids and O2 to 3 L, her symptoms got worse. She is admitted to hospitalist service for management of copd exacerbation and mild CHF exacerbation.   Assessment/Plan:   Acute respiratory failure likely secondary to COPD/asthma exacerbation with mild CHF exacerbation  She was admitted to step down unit overnight, she had required BiPAP during the previous admission.  She is currently on 3.5 lit [place] oxygen with good oxygen sats.  Will continue oxygen supplementation,  scheduled bronchodilators, Levaquin,Pulmicort , IV solumedrol,. She will need a slow taper of her prednisone.  Will transfer her to med surg bed today.    Hypothyroidism: - continue Synthroid   Type 2 diabetes mellitus  -Placed on sliding scale insulin  CBG (last 3)   Recent Labs  12/18/13 1612 12/18/13 2159 12/19/13 0741  GLUCAP 258* 145* 182*  start her on pre meal coverage 2 untis TIDAC.  Her hgba1c is 6.9.    Essential hypertension, benign  Resume lisinopril.   Mild CHF exacerbation:  - Patient was given IV Lasix, will continue IV Lasix for diuresis for another 24 hours .  I/O last 3 completed shifts: In: [id number] [P.O.:720;  IV Piggyback:150] Out: 1050 [Urine:1050] Total I/O In: 240 [P.O.:240] Out: 200 [Urine:200]   Hypokalemia: - replete as needed.    Atrial fibrillation  - rate controlled.  - Continue Cardizem, eliquis    Elevated theophylline level:  - holding theophylline, get repeat level and resume on discharge.    DVT prophylaxis: Currently on eliquis   Code Status: full code Family Communication: discussed with son at bedside, . Disposition Plan: transfer to med surg bed.    Consultants:  none  Procedures:  none  Antibiotics:  Levaquin.   HPI/Subjective: Feeling better than yesterday. Reports having worsening sob on weaning her oxygen.   Objective: Filed Vitals:   12/19/13 0800  BP: 128/61  Pulse: 90  Temp:   Resp: 26    Intake/Output Summary (Last 24 hours) at 12/19/13 1134 Last data filed at 12/19/13 0800  Gross per 24 hour  Intake   1110 ml  Output   1250 ml  Net   -140 ml   Filed Weights   12/18/13 1116 12/18/13 1204 12/19/13 0500  Weight: 90.765 kg (200 lb 1.6 oz) 91.1 kg (200 lb 13.4 oz) 88.8 kg (195 lb 12.3 oz)    Exam:  Alert afebrile comfortable on 3.5 lit [place] oxygen.  Heart: tachycardia, regular rate and rhythm  Lungs: bibasal crackles with diffuse rhonchi  Abdomen:Obese Soft, sore, , nondistended, positive bowel sounds, no masses.  Extremities: No clubbing, cyanosis or edema with positive pedal pulses.   Data Reviewed: Basic Metabolic Panel:  Recent Labs Lab 12/18/13 0838 12/19/13 0418  NA 138 140  K 3.9 3.5*  CL 95* 92*  CO2 32 34*  GLUCOSE 179* 237*  BUN 16 19  CREATININE 0.92 0.66  CALCIUM 9.1 9.8   Liver Function Tests:  Recent Labs Lab 12/18/13 0838  AST 10  ALT 10  ALKPHOS 78  BILITOT 0.4  PROT 6.5  ALBUMIN 2.6*   No results found for this basename: LIPASE, AMYLASE,  in the last 168 hours No results found for this basename: AMMONIA,  in the last 168 hours CBC:  Recent Labs Lab 12/18/13 0838 12/19/13 0418   WBC 14.5* 7.5  NEUTROABS 12.6*  --   HGB 12.6 13.0  HCT 39.7 41.0  MCV 80.2 79.3  PLT 116* 177   Cardiac Enzymes:  Recent Labs Lab 12/18/13 0838 12/18/13 1216 12/18/13 1839 12/19/13 0004  TROPONINI <0.30 <0.30 <0.30 <0.30   BNP (last 3 results)  Recent Labs  09/08/13 1011 11/27/13 0717 12/18/13 0838  PROBNP 1725.0* 916.9* 258.8*   CBG:  Recent Labs Lab 12/18/13 1612 12/18/13 2159 12/19/13 0741  GLUCAP 258* 145* 182*    Recent Results (from the past 240 hour(s))  MRSA PCR SCREENING     Status: None   Collection Time    12/18/13 12:15 PM      Result Value Range Status   MRSA by PCR NEGATIVE  NEGATIVE Final   Comment:            The GeneXpert MRSA Assay (FDA     approved for NASAL specimens     only), is one component of a     comprehensive MRSA colonization     surveillance program. It is not     intended to diagnose MRSA     infection nor to guide or     monitor treatment for     MRSA infections.     Studies: Dg Chest Portable 1 View  12/18/2013   CLINICAL DATA:  Shortness of breath, hypoxia  EXAM: PORTABLE CHEST - 1 VIEW  COMPARISON:  11/27/2013  FINDINGS: Increased interstitial markings, favored to reflect mild interstitial edema. Possible small left pleural effusion. No pneumothorax.  The heart is normal in size.  IMPRESSION: Suspected mild interstitial edema.  Possible small left pleural effusion.   Electronically Signed   By: Julian Hy M.D.   On: 12/18/2013 08:21    Scheduled Meds: . ALPRAZolam  0.5 mg Oral QHS  . apixaban  5 mg Oral BID  . budesonide  0.25 mg Nebulization BID  . budesonide-formoterol  2 puff Inhalation BID  . citalopram  10 mg Oral Daily  . diltiazem  240 mg Oral Daily  . fluticasone  1 spray Each Nare Daily  . furosemide  40 mg Intravenous Q12H  . gabapentin  100 mg Oral TID  . guaiFENesin  1,200 mg Oral BID  . insulin aspart  0-15 Units Subcutaneous TID WC  . insulin aspart  0-5 Units Subcutaneous QHS  .  levalbuterol  0.63 mg Nebulization Q4H   And  . ipratropium  0.5 mg Nebulization Q4H  . levofloxacin (LEVAQUIN) IV  750 mg Intravenous Q24H  . levothyroxine  112 mcg Oral QAC breakfast  . loratadine  10 mg Oral Daily  . methylPREDNISolone (SOLU-MEDROL) injection  60 mg Intravenous Q12H  . pantoprazole  40 mg Oral Daily  . simvastatin  40 mg Oral q1800  . vitamin E  1,000 Units Oral Daily   Continuous Infusions:   Principal Problem:   Acute respiratory failure Active Problems:  HYPOTHYROIDISM   Type 2 diabetes mellitus   Essential hypertension, benign   COPD with exacerbation    Time spent: 35 min    [facility] Pager [phone] 7PM-7AM, please contact night-coverage at [email or web address], password [facility] 12/19/2013, 11:34 AM  LOS: 1 day

## 2013-12-20 ENCOUNTER — Inpatient Hospital Stay (HOSPITAL_COMMUNITY): Payer: Medicaid Other

## 2013-12-20 DIAGNOSIS — J9600 Acute respiratory failure, unspecified whether with hypoxia or hypercapnia: Secondary | ICD-10-CM

## 2013-12-20 DIAGNOSIS — E119 Type 2 diabetes mellitus without complications: Secondary | ICD-10-CM

## 2013-12-20 DIAGNOSIS — J441 Chronic obstructive pulmonary disease with (acute) exacerbation: Secondary | ICD-10-CM

## 2013-12-20 DIAGNOSIS — R0902 Hypoxemia: Secondary | ICD-10-CM

## 2013-12-20 LAB — BASIC METABOLIC PANEL
BUN: 24 mg/dL — AB (ref 6–23)
CALCIUM: 10.3 mg/dL (ref 8.4–10.5)
CO2: 32 mEq/L (ref 19–32)
CREATININE: 0.67 mg/dL (ref 0.50–1.10)
Chloride: 94 mEq/L — ABNORMAL LOW (ref 96–112)
GFR calc Af Amer: >90 mL/min (ref >90)
Glucose, Bld: 274 mg/dL — ABNORMAL HIGH (ref 70–99)
Potassium: 4.4 mEq/L (ref 3.7–5.3)
Sodium: 139 mEq/L (ref 137–147)

## 2013-12-20 LAB — GLUCOSE, CAPILLARY
GLUCOSE-CAPILLARY: 233 mg/dL — AB (ref 70–99)
GLUCOSE-CAPILLARY: 198 mg/dL — AB (ref 70–99)
GLUCOSE-CAPILLARY: 231 mg/dL — AB (ref 70–99)
GLUCOSE-CAPILLARY: 184 mg/dL — AB (ref 70–99)

## 2013-12-20 LAB — PRO B NATRIURETIC PEPTIDE: Pro B Natriuretic peptide (BNP): 1475 pg/mL — ABNORMAL HIGH (ref 0–125)

## 2013-12-20 MED ORDER — INSULIN ASPART 100 UNIT/ML ~~LOC~~ SOLN
4.0000 [IU] | Freq: Three times a day (TID) | SUBCUTANEOUS | Status: DC
  Administered 2013-12-20 – 2013-12-21 (×2): 4 [IU] via SUBCUTANEOUS

## 2013-12-20 MED ORDER — SALINE SPRAY 0.65 % NA SOLN
1.0000 | NASAL | Status: DC | PRN
  Administered 2013-12-20: 1 via NASAL
  Filled 2013-12-20: qty 44

## 2013-12-20 NOTE — Progress Notes (Signed)
Patient ID: Joanna Perez  female  XBJ:[id number]    DOB: Jul 17, 1956    DOA: 12/18/2013  PCP: Glo Herring., MD  Assessment/Plan: Principal Problem: Acute respiratory failure likely secondary to  1) COPD/asthma exacerbation  - The patient feels somewhat improving, currently on 4 L O2 via nasal cannula  - Continue O2, scheduled nebs, continue Solu-Medrol 60mg  q12 hrs today, when transitioned to oral prednisone in a.m. if improving. She will need a slow taper.  - Continue Levaquin,Pulmicort   2) mild acute on chronic diastolic CHF exacerbation - Continue IV Lasix today - 2-D echo in 10 2014 had shown EF of 30-86%, grade 2 diastolic dysfunction - negative 1.3L   Hypothyroidism:  - continue Synthroid   Type 2 diabetes mellitus  -Placed on sliding scale insulin,  Her hgba1c is 6.9.  - Patient is not on any oral hypoglycemics, will benefit from metformin at dc  Essential hypertension, benign  on lisinopril.   Atrial fibrillation  - rate controlled.  - Continue Cardizem, eliquis   Elevated theophylline level:  - holding theophylline, get repeat level and resume on discharge.   DVT Prophylaxis:  Code Status:  Family Communication: discussed with son, daughter in law at bed side  Disposition: hopefully DC home in 24-48hrs    Subjective: Feeling somewhat better today, states that the oxygen via the nasal cannula dries up her nostrils Objective: Weight change:   Intake/Output Summary (Last 24 hours) at 12/20/13 1319 Last data filed at 12/20/13 1305  Gross per 24 hour  Intake    720 ml  Output   1950 ml  Net  -1230 ml   Blood pressure 134/80, pulse 80, temperature 97.9 F (36.6 C), temperature source Oral, resp. rate 20, height 5' (1.524 m), weight 88.8 kg (195 lb 12.3 oz), SpO2 91.00%.  Physical Exam: General: Alert and awake, oriented x3, not in any acute distress. CVS: S1-S2 clear, no murmur rubs or gallops Chest: Bilateral wheezing and rhonchi improving from  admission Abdomen: soft nontender, nondistended, normal bowel sounds  Extremities: no cyanosis, clubbing or edema noted bilaterally Neuro: Cranial nerves II-XII intact, no focal neurological deficits  Lab Results: Basic Metabolic Panel:  Recent Labs Lab 12/19/13 0418 12/20/13 0615  NA 140 139  K 3.5* 4.4  CL 92* 94*  CO2 34* 32  GLUCOSE 237* 274*  BUN 19 24*  CREATININE 0.66 0.67  CALCIUM 9.8 10.3   Liver Function Tests:  Recent Labs Lab 12/18/13 0838  AST 10  ALT 10  ALKPHOS 78  BILITOT 0.4  PROT 6.5  ALBUMIN 2.6*   No results found for this basename: LIPASE, AMYLASE,  in the last 168 hours No results found for this basename: AMMONIA,  in the last 168 hours CBC:  Recent Labs Lab 12/18/13 0838 12/19/13 0418  WBC 14.5* 7.5  NEUTROABS 12.6*  --   HGB 12.6 13.0  HCT 39.7 41.0  MCV 80.2 79.3  PLT 116* 177   Cardiac Enzymes:  Recent Labs Lab 12/18/13 1216 12/18/13 1839 12/19/13 0004  TROPONINI <0.30 <0.30 <0.30   BNP: No components found with this basename: POCBNP,  CBG:  Recent Labs Lab 12/19/13 1158 12/19/13 1653 12/19/13 2106 12/20/13 0715 12/20/13 1106  GLUCAP 165* 148* 156* 233* 198*     Micro Results: Recent Results (from the past 240 hour(s))  URINE CULTURE     Status: None   Collection Time    12/18/13 12:03 PM      Result Value Range Status  Specimen Description URINE, CLEAN CATCH   Final   Special Requests NONE   Final   Culture  Setup Time     Final   Value: 12/19/2013 00:24     Performed at [place]     Final   Value: NO GROWTH     Performed at [facility]   Culture     Final   Value: NO GROWTH     Performed at [facility]   Report Status 12/19/2013 FINAL   Final  MRSA PCR SCREENING     Status: None   Collection Time    12/18/13 12:15 PM      Result Value Range Status   MRSA by PCR NEGATIVE  NEGATIVE Final   Comment:            The GeneXpert MRSA Assay (FDA      approved for NASAL specimens     only), is one component of a     comprehensive MRSA colonization     surveillance program. It is not     intended to diagnose MRSA     infection nor to guide or     monitor treatment for     MRSA infections.    Studies/Results: Dg Chest 2 View  11/27/2013   CLINICAL DATA:  Shortness of breath, chest pain, wheezing and cough.  EXAM: CHEST - 2 VIEW  COMPARISON:  Film earlier today at 0716 hr  FINDINGS: The heart is enlarged. There remains evidence of interstitial prominence and pulmonary venous prominence likely consistent with mild interstitial edema. Small bilateral pleural effusions are present in the lateral projection. No focal airspace consolidation is identified.  IMPRESSION: Cardiomegaly and probable mild interstitial edema. Small bilateral pleural effusions are present.   Electronically Signed   By: Aletta Edouard M.D.   On: 11/27/2013 09:53   Dg Chest Port 1 View  12/20/2013   CLINICAL DATA:  Pulmonary edema.  EXAM: PORTABLE CHEST - 1 VIEW  COMPARISON:  12/18/2013 and multiple prior exams.  FINDINGS: Mild diffuse vascular congestion is stable. There is mild hazy airspace opacity in the upper lobes most evident on the right. Findings may reflect asymmetric pulmonary edema. Upper lobe infiltrate should be considered in the proper clinical setting. Edema is favored.  No pleural effusion or pneumothorax. Heart, mediastinum and hila are unremarkable.  IMPRESSION: No significant change from the previous day's study when allowing for differences in patient positioning and technique. Findings support mild bilateral asymmetric pulmonary edema.   Electronically Signed   By: Lajean Manes M.D.   On: 12/20/2013 08:13   Dg Chest Portable 1 View  12/18/2013   CLINICAL DATA:  Shortness of breath, hypoxia  EXAM: PORTABLE CHEST - 1 VIEW  COMPARISON:  11/27/2013  FINDINGS: Increased interstitial markings, favored to reflect mild interstitial edema. Possible small left pleural  effusion. No pneumothorax.  The heart is normal in size.  IMPRESSION: Suspected mild interstitial edema.  Possible small left pleural effusion.   Electronically Signed   By: Julian Hy M.D.   On: 12/18/2013 08:21   Dg Chest Portable 1 View  11/27/2013   CLINICAL DATA:  Midsternal chest pain.  EXAM: PORTABLE CHEST - 1 VIEW  COMPARISON:  Chest x-ray 11/03/2013.  FINDINGS: Lung volumes are slightly low. Film is underpenetrated, reducing the diagnostic sensitivity and specificity of the examination. With these limitations in mind, there are some bibasilar opacities (left greater than right), favored to reflect  subsegmental atelectasis. Underlying airspace consolidation in the left lower lobe is difficult to entirely exclude on this limited examination. In addition, there may be a small left pleural effusion, however, this is favored to be technique related. Cephalization of the pulmonary vasculature, without frank pulmonary edema. Heart size appears borderline enlarged, likely accentuated by patient's rotation to the left which also distorts upper mediastinal contours. Atherosclerosis in the thoracic aorta.  IMPRESSION: 1. Borderline cardiomegaly with pulmonary venous congestion, but no frank pulmonary edema. 2. Probable bibasilar subsegmental atelectasis. The possibility of airspace consolidation in the left lower lobe and/or a small left pleural effusion is not excluded. 3. Atherosclerosis.   Electronically Signed   By: Vinnie Langton M.D.   On: 11/27/2013 07:36    Medications: Scheduled Meds: . ALPRAZolam  0.5 mg Oral QHS  . apixaban  5 mg Oral BID  . budesonide  0.25 mg Nebulization BID  . budesonide-formoterol  2 puff Inhalation BID  . citalopram  10 mg Oral Daily  . diltiazem  240 mg Oral Daily  . fluticasone  1 spray Each Nare Daily  . furosemide  40 mg Intravenous Q12H  . gabapentin  100 mg Oral TID  . guaiFENesin  1,200 mg Oral BID  . insulin aspart  0-15 Units Subcutaneous TID WC  .  insulin aspart  0-5 Units Subcutaneous QHS  . insulin aspart  2 Units Subcutaneous TID WC  . levalbuterol  0.63 mg Nebulization Q4H   And  . ipratropium  0.5 mg Nebulization Q4H  . levofloxacin (LEVAQUIN) IV  750 mg Intravenous Q24H  . levothyroxine  112 mcg Oral QAC breakfast  . loratadine  10 mg Oral Daily  . methylPREDNISolone (SOLU-MEDROL) injection  60 mg Intravenous Q12H  . pantoprazole  40 mg Oral Daily  . ramipril  10 mg Oral Daily  . simvastatin  40 mg Oral q1800  . vitamin E  1,000 Units Oral Daily      LOS: 2 days   Jovin Fester M.D. [facility] 12/20/2013, 1:19 PM Pager: [phone]  If 7PM-7AM, please contact night-coverage [email or web address] Password [email or web address]

## 2013-12-21 LAB — BASIC METABOLIC PANEL
BUN: 29 mg/dL — ABNORMAL HIGH (ref 6–23)
CALCIUM: 10 mg/dL (ref 8.4–10.5)
CO2: 36 meq/L — AB (ref 19–32)
CREATININE: 0.7 mg/dL (ref 0.50–1.10)
Chloride: 94 mEq/L — ABNORMAL LOW (ref 96–112)
GFR calc non Af Amer: >90 mL/min (ref >90)
Glucose, Bld: 270 mg/dL — ABNORMAL HIGH (ref 70–99)
Potassium: 4.1 mEq/L (ref 3.7–5.3)
Sodium: 141 mEq/L (ref 137–147)

## 2013-12-21 LAB — GLUCOSE, CAPILLARY
Glucose-Capillary: 252 mg/dL — ABNORMAL HIGH (ref 70–99)
Glucose-Capillary: 263 mg/dL — ABNORMAL HIGH (ref 70–99)
Glucose-Capillary: 159 mg/dL — ABNORMAL HIGH (ref 70–99)

## 2013-12-21 MED ORDER — INSULIN ASPART 100 UNIT/ML ~~LOC~~ SOLN
6.0000 [IU] | Freq: Three times a day (TID) | SUBCUTANEOUS | Status: DC
  Administered 2013-12-21 – 2013-12-22 (×3): 6 [IU] via SUBCUTANEOUS

## 2013-12-21 MED ORDER — LEVOFLOXACIN 750 MG PO TABS
750.0000 mg | ORAL_TABLET | Freq: Every day | ORAL | Status: DC
  Administered 2013-12-21 – 2013-12-22 (×2): 750 mg via ORAL
  Filled 2013-12-21 (×2): qty 1

## 2013-12-21 MED ORDER — HYDROCODONE-HOMATROPINE 5-1.5 MG/5ML PO SYRP
5.0000 mL | ORAL_SOLUTION | Freq: Four times a day (QID) | ORAL | Status: DC | PRN
  Administered 2013-12-21 – 2013-12-22 (×2): 5 mL via ORAL
  Filled 2013-12-21 (×2): qty 5

## 2013-12-21 MED ORDER — PREDNISONE 20 MG PO TABS
60.0000 mg | ORAL_TABLET | Freq: Every day | ORAL | Status: DC
  Administered 2013-12-22: 60 mg via ORAL
  Filled 2013-12-21: qty 3

## 2013-12-21 NOTE — Progress Notes (Signed)
PHARMACIST - PHYSICIAN COMMUNICATION DR:   [place] CONCERNING: Antibiotic IV to Oral Route Change Policy  RECOMMENDATION: This patient is receiving Levaquin by the intravenous route.  Based on criteria approved by the Pharmacy and Therapeutics Committee, the antibiotic(s) is/are being converted to the equivalent oral dose form(s).   DESCRIPTION: These criteria include:  Patient being treated for a respiratory tract infection, urinary tract infection, cellulitis or clostridium difficile associated diarrhea if on metronidazole  The patient is not neutropenic and does not exhibit a GI malabsorption state  The patient is eating (either orally or via tube) and/or has been taking other orally administered medications for a least 24 hours  The patient is improving clinically and has a Tmax < 100.5  If you have questions about this conversion, please contact the Pharmacy Department  [x]   [phone] )  [facility] []   [phone] )  [facility]  []   [phone] )  [facility] []   [phone] )  [place], PharmD, BCPS 12/21/2013@12 :07 PM

## 2013-12-21 NOTE — Progress Notes (Signed)
Patient ID: Joanna Perez  female  PIR:[id number]    DOB: Jun 17, 1956    DOA: 12/18/2013  PCP: Glo Herring., MD  Assessment/Plan: Principal Problem: Acute respiratory failure likely secondary to  1) COPD/asthma exacerbation : Still wheezing - The patient feels somewhat improving, currently on 4 L O2 via nasal cannula  - Continue O2, scheduled nebs, transitioned to oral prednisone.  She will need a slow taper.  - Continue Levaquin,Pulmicort   2) mild acute on chronic diastolic CHF exacerbation - Continue IV Lasix today, change to oral lasix in AM - 2-D echo in 10 2014 had shown EF of 63-01%, grade 2 diastolic dysfunction - negative 4.9 since admission   Hypothyroidism:  - continue Synthroid   Type 2 diabetes mellitus : Uncontrolled -Placed on sliding scale insulin, meal coverage, Her hgba1c is 6.9.  - Patient is not on any oral hypoglycemics, will benefit from metformin and amaryl at dc esp she is going to be on steroids  Essential hypertension, benign  on lisinopril.   Atrial fibrillation  - rate controlled.  - Continue Cardizem, eliquis   Elevated theophylline level:  - holding theophylline, resume on discharge.   DVT Prophylaxis:  Code Status:  Family Communication: Patient herself  Disposition: hopefully DC home in a.m.    Subjective: Feeling somewhat better today, still wheezing  Objective: Weight change:   Intake/Output Summary (Last 24 hours) at 12/21/13 1103 Last data filed at 12/21/13 0907  Gross per 24 hour  Intake    720 ml  Output   4375 ml  Net  -3655 ml   Blood pressure 144/79, pulse 81, temperature 97.7 F (36.5 C), temperature source Oral, resp. rate 20, height 5' (1.524 m), weight 88.8 kg (195 lb 12.3 oz), SpO2 95.00%.  Physical Exam: General: A x O x3 CVS: S1-S2 clear, no murmur rubs or gallops Chest: Bilateral wheezing and rhonchi improving from admission Abdomen: soft nontender, nondistended, normal bowel sounds  Extremities: no  cyanosis, clubbing or edema noted bilaterally   Lab Results: Basic Metabolic Panel:  Recent Labs Lab 12/20/13 0615 12/21/13 0535  NA 139 141  K 4.4 4.1  CL 94* 94*  CO2 32 36*  GLUCOSE 274* 270*  BUN 24* 29*  CREATININE 0.67 0.70  CALCIUM 10.3 10.0   Liver Function Tests:  Recent Labs Lab 12/18/13 0838  AST 10  ALT 10  ALKPHOS 78  BILITOT 0.4  PROT 6.5  ALBUMIN 2.6*   No results found for this basename: LIPASE, AMYLASE,  in the last 168 hours No results found for this basename: AMMONIA,  in the last 168 hours CBC:  Recent Labs Lab 12/18/13 0838 12/19/13 0418  WBC 14.5* 7.5  NEUTROABS 12.6*  --   HGB 12.6 13.0  HCT 39.7 41.0  MCV 80.2 79.3  PLT 116* 177   Cardiac Enzymes:  Recent Labs Lab 12/18/13 1216 12/18/13 1839 12/19/13 0004  TROPONINI <0.30 <0.30 <0.30   BNP: No components found with this basename: POCBNP,  CBG:  Recent Labs Lab 12/20/13 0715 12/20/13 1106 12/20/13 1619 12/20/13 2059 12/21/13 0715  GLUCAP 233* 198* 231* 184* 252*     Micro Results: Recent Results (from the past 240 hour(s))  URINE CULTURE     Status: None   Collection Time    12/18/13 12:03 PM      Result Value Range Status   Specimen Description URINE, CLEAN CATCH   Final   Special Requests NONE   Final   Culture  Setup  Time     Final   Value: 12/19/2013 00:24     Performed at [place]     Final   Value: NO GROWTH     Performed at [facility]   Culture     Final   Value: NO GROWTH     Performed at [facility]   Report Status 12/19/2013 FINAL   Final  MRSA PCR SCREENING     Status: None   Collection Time    12/18/13 12:15 PM      Result Value Range Status   MRSA by PCR NEGATIVE  NEGATIVE Final   Comment:            The GeneXpert MRSA Assay (FDA     approved for NASAL specimens     only), is one component of a     comprehensive MRSA colonization     surveillance program. It is not     intended to  diagnose MRSA     infection nor to guide or     monitor treatment for     MRSA infections.    Studies/Results: Dg Chest 2 View  11/27/2013   CLINICAL DATA:  Shortness of breath, chest pain, wheezing and cough.  EXAM: CHEST - 2 VIEW  COMPARISON:  Film earlier today at 0716 hr  FINDINGS: The heart is enlarged. There remains evidence of interstitial prominence and pulmonary venous prominence likely consistent with mild interstitial edema. Small bilateral pleural effusions are present in the lateral projection. No focal airspace consolidation is identified.  IMPRESSION: Cardiomegaly and probable mild interstitial edema. Small bilateral pleural effusions are present.   Electronically Signed   By: Aletta Edouard M.D.   On: 11/27/2013 09:53   Dg Chest Port 1 View  12/20/2013   CLINICAL DATA:  Pulmonary edema.  EXAM: PORTABLE CHEST - 1 VIEW  COMPARISON:  12/18/2013 and multiple prior exams.  FINDINGS: Mild diffuse vascular congestion is stable. There is mild hazy airspace opacity in the upper lobes most evident on the right. Findings may reflect asymmetric pulmonary edema. Upper lobe infiltrate should be considered in the proper clinical setting. Edema is favored.  No pleural effusion or pneumothorax. Heart, mediastinum and hila are unremarkable.  IMPRESSION: No significant change from the previous day's study when allowing for differences in patient positioning and technique. Findings support mild bilateral asymmetric pulmonary edema.   Electronically Signed   By: Lajean Manes M.D.   On: 12/20/2013 08:13   Dg Chest Portable 1 View  12/18/2013   CLINICAL DATA:  Shortness of breath, hypoxia  EXAM: PORTABLE CHEST - 1 VIEW  COMPARISON:  11/27/2013  FINDINGS: Increased interstitial markings, favored to reflect mild interstitial edema. Possible small left pleural effusion. No pneumothorax.  The heart is normal in size.  IMPRESSION: Suspected mild interstitial edema.  Possible small left pleural effusion.    Electronically Signed   By: Julian Hy M.D.   On: 12/18/2013 08:21   Dg Chest Portable 1 View  11/27/2013   CLINICAL DATA:  Midsternal chest pain.  EXAM: PORTABLE CHEST - 1 VIEW  COMPARISON:  Chest x-ray 11/03/2013.  FINDINGS: Lung volumes are slightly low. Film is underpenetrated, reducing the diagnostic sensitivity and specificity of the examination. With these limitations in mind, there are some bibasilar opacities (left greater than right), favored to reflect subsegmental atelectasis. Underlying airspace consolidation in the left lower lobe is difficult to entirely exclude on this limited examination. In addition,  there may be a small left pleural effusion, however, this is favored to be technique related. Cephalization of the pulmonary vasculature, without frank pulmonary edema. Heart size appears borderline enlarged, likely accentuated by patient's rotation to the left which also distorts upper mediastinal contours. Atherosclerosis in the thoracic aorta.  IMPRESSION: 1. Borderline cardiomegaly with pulmonary venous congestion, but no frank pulmonary edema. 2. Probable bibasilar subsegmental atelectasis. The possibility of airspace consolidation in the left lower lobe and/or a small left pleural effusion is not excluded. 3. Atherosclerosis.   Electronically Signed   By: Vinnie Langton M.D.   On: 11/27/2013 07:36    Medications: Scheduled Meds: . ALPRAZolam  0.5 mg Oral QHS  . apixaban  5 mg Oral BID  . budesonide  0.25 mg Nebulization BID  . budesonide-formoterol  2 puff Inhalation BID  . citalopram  10 mg Oral Daily  . diltiazem  240 mg Oral Daily  . fluticasone  1 spray Each Nare Daily  . furosemide  40 mg Intravenous Q12H  . gabapentin  100 mg Oral TID  . insulin aspart  0-15 Units Subcutaneous TID WC  . insulin aspart  0-5 Units Subcutaneous QHS  . insulin aspart  6 Units Subcutaneous TID WC  . levalbuterol  0.63 mg Nebulization Q4H   And  . ipratropium  0.5 mg Nebulization Q4H   . levofloxacin (LEVAQUIN) IV  750 mg Intravenous Q24H  . levothyroxine  112 mcg Oral QAC breakfast  . loratadine  10 mg Oral Daily  . pantoprazole  40 mg Oral Daily  . predniSONE  60 mg Oral Q breakfast  . ramipril  10 mg Oral Daily  . simvastatin  40 mg Oral q1800  . vitamin E  1,000 Units Oral Daily      LOS: 3 days   RAI,RIPUDEEP M.D. [facility] 12/21/2013, 11:03 AM Pager: [phone]  If 7PM-7AM, please contact night-coverage [email or web address] Password [email or web address]

## 2013-12-21 NOTE — Progress Notes (Signed)
Inpatient Diabetes Program Recommendations  AACE/ADA: New Consensus Statement on Inpatient Glycemic Control (2013)  Target Ranges:  Prepandial:   less than 140 mg/dL      Peak postprandial:   less than 180 mg/dL (1-2 hours)      Critically ill patients:  140 - 180 mg/dL   Results for ONI, DIETZMAN (MRN [id number]) as of 12/21/2013 07:44  Ref. Range 12/20/2013 07:15 12/20/2013 11:06 12/20/2013 16:19 12/20/2013 20:59 12/21/2013 07:15  Glucose-Capillary Latest Range: 70-99 mg/dL 233 (H) 198 (H) 231 (H) 184 (H) 252 (H)   Inpatient Diabetes Program Recommendations Correction (SSI): Please consider increasing Novolog correction to resistant correction scale. Insulin - Meal Coverage: Please consider increasing Novolog meal coverage to 6 unitsTID with meals.  Thanks, Barnie Alderman, RN, MSN, CCRN Diabetes Coordinator Inpatient Diabetes Program [phone] (Team Pager) [phone] ([facility] office) [phone] [facility] office)

## 2013-12-22 DIAGNOSIS — E119 Type 2 diabetes mellitus without complications: Secondary | ICD-10-CM

## 2013-12-22 DIAGNOSIS — J45909 Unspecified asthma, uncomplicated: Secondary | ICD-10-CM

## 2013-12-22 DIAGNOSIS — E039 Hypothyroidism, unspecified: Secondary | ICD-10-CM

## 2013-12-22 LAB — BASIC METABOLIC PANEL
BUN: 33 mg/dL — AB (ref 6–23)
CHLORIDE: 91 meq/L — AB (ref 96–112)
CO2: 40 meq/L — AB (ref 19–32)
Calcium: 10.1 mg/dL (ref 8.4–10.5)
Creatinine, Ser: 0.79 mg/dL (ref 0.50–1.10)
GFR calc Af Amer: >90 mL/min (ref >90)
GFR calc non Af Amer: >90 mL/min (ref >90)
GLUCOSE: 250 mg/dL — AB (ref 70–99)
Potassium: 3.6 mEq/L — ABNORMAL LOW (ref 3.7–5.3)
Sodium: 142 mEq/L (ref 137–147)

## 2013-12-22 LAB — GLUCOSE, CAPILLARY: GLUCOSE-CAPILLARY: 175 mg/dL — AB (ref 70–99)

## 2013-12-22 MED ORDER — BENZONATATE 100 MG PO CAPS: 100.0000 mg | ORAL_CAPSULE | Freq: Three times a day (TID) | ORAL | Status: DC | PRN

## 2013-12-22 MED ORDER — PREDNISONE 10 MG PO TABS: ORAL_TABLET | ORAL | Status: DC

## 2013-12-22 MED ORDER — FLUCONAZOLE 150 MG PO TABS: 150.0000 mg | ORAL_TABLET | ORAL | Status: DC | PRN

## 2013-12-22 MED ORDER — FLUCONAZOLE 100 MG PO TABS
150.0000 mg | ORAL_TABLET | Freq: Once | ORAL | Status: AC
  Administered 2013-12-22: 150 mg via ORAL
  Filled 2013-12-22: qty 2

## 2013-12-22 MED ORDER — LEVOFLOXACIN 750 MG PO TABS: 750.0000 mg | ORAL_TABLET | Freq: Every day | ORAL | Status: DC

## 2013-12-22 MED ORDER — METFORMIN HCL 1000 MG PO TABS: 500.0000 mg | ORAL_TABLET | Freq: Two times a day (BID) | ORAL | Status: DC

## 2013-12-22 MED ORDER — SALINE SPRAY 0.65 % NA SOLN: 1.0000 | NASAL | Status: AC | PRN

## 2013-12-22 MED ORDER — HYDROCODONE-HOMATROPINE 5-1.5 MG/5ML PO SYRP: 5.0000 mL | ORAL_SOLUTION | Freq: Four times a day (QID) | ORAL | Status: DC | PRN

## 2013-12-22 MED ORDER — NYSTATIN 100000 UNIT/ML MT SUSP
5.0000 mL | Freq: Four times a day (QID) | OROMUCOSAL | Status: DC
  Administered 2013-12-22: 500000 [IU] via ORAL
  Filled 2013-12-22: qty 5

## 2013-12-22 MED ORDER — FUROSEMIDE 20 MG PO TABS: 40.0000 mg | ORAL_TABLET | Freq: Two times a day (BID) | ORAL | Status: DC

## 2013-12-22 MED ORDER — METFORMIN HCL 500 MG PO TABS: 500.0000 mg | ORAL_TABLET | Freq: Two times a day (BID) | ORAL | Status: DC

## 2013-12-22 MED ORDER — METFORMIN HCL 1000 MG PO TABS: 1000.0000 mg | ORAL_TABLET | Freq: Two times a day (BID) | ORAL | Status: DC

## 2013-12-22 MED ORDER — NYSTATIN 100000 UNIT/ML MT SUSP
5.0000 mL | Freq: Four times a day (QID) | OROMUCOSAL | Status: DC
Start: 2013-12-22 — End: 2014-02-28

## 2013-12-22 NOTE — Discharge Summary (Signed)
Physician Discharge Summary  Patient ID: Joanna Perez MRN: [id number] DOB/AGE: 12/27/1955 58 y.o.  Admit date: 12/18/2013 Discharge date: 12/22/2013  Primary Care Physician:  Glo Herring., MD  Discharge Diagnoses:   . Acute respiratory failure due to COPD exacerbation  . COPD with exacerbation . Type 2 diabetes mellitus . HYPOTHYROIDISM . Essential hypertension, benign . vaginitis/yeast infection  . Atrial fibrillation  Consults:None   Recommendations for Outpatient Follow-up:    Allergies:   Allergies  Allergen Reactions  . Amoxicillin Hives  . Bactrim [Sulfamethoxazole-Tmp Ds] Hives  . Erythromycin Hives  . Keflex [Cephalexin] Hives  . Penicillins Itching and Swelling    Sweating     Discharge Medications:   Medication List         albuterol 108 (90 BASE) MCG/ACT inhaler  Commonly known as:  PROVENTIL HFA;VENTOLIN HFA  Inhale 1 puff into the lungs every 4 (four) hours as needed for wheezing or shortness of breath.     albuterol 2 MG tablet  Commonly known as:  PROVENTIL  Take 1 mg by mouth 3 (three) times daily.     albuterol-ipratropium 18-103 MCG/ACT inhaler  Commonly known as:  COMBIVENT  Inhale 2 puffs into the lungs every 6 (six) hours as needed for wheezing or shortness of breath.     ALPRAZolam 1 MG tablet  Commonly known as:  XANAX  Take 0.5 tablets (0.5 mg total) by mouth at bedtime.     apixaban 5 MG Tabs tablet  Commonly known as:  ELIQUIS  Take 1 tablet (5 mg total) by mouth 2 (two) times daily.     benzonatate 100 MG capsule  Commonly known as:  TESSALON PERLES  Take 1 capsule (100 mg total) by mouth 3 (three) times daily as needed for cough.     bisacodyl 5 MG EC tablet  Commonly known as:  DULCOLAX  Take 5 mg by mouth daily as needed for moderate constipation.     budesonide-formoterol 160-4.5 MCG/ACT inhaler  Commonly known as:  SYMBICORT  Inhale 2 puffs into the lungs.     citalopram 10 MG tablet  Commonly known as:   CELEXA  Take 10 mg by mouth daily.     diltiazem 240 MG 24 hr capsule  Commonly known as:  CARDIZEM CD  Take 1 capsule (240 mg total) by mouth daily.     fexofenadine 180 MG tablet  Commonly known as:  ALLEGRA  Take 180 mg by mouth 2 (two) times daily.     fluconazole 150 MG tablet  Commonly known as:  DIFLUCAN  Take 1 tablet (150 mg total) by mouth every 3 (three) days as needed (vaginitis/yeast infection).     fluticasone 50 MCG/ACT nasal spray  Commonly known as:  FLONASE  Place 1 spray into both nostrils.     furosemide 20 MG tablet  Commonly known as:  LASIX  Take 2 tablets (40 mg total) by mouth 2 (two) times daily.  Start taking on:  12/23/2013     guaiFENesin 600 MG 12 hr tablet  Commonly known as:  MUCINEX  Take 600 mg by mouth 2 (two) times daily as needed for cough.     HYDROcodone-acetaminophen 10-325 MG per tablet  Commonly known as:  NORCO  Take 1 tablet by mouth every 12 (twelve) hours as needed for severe pain.     HYDROcodone-homatropine 5-1.5 MG/5ML syrup  Commonly known as:  HYCODAN  Take 5 mLs by mouth every 6 (six) hours as needed for  cough.     levofloxacin 750 MG tablet  Commonly known as:  LEVAQUIN  Take 1 tablet (750 mg total) by mouth daily. X 5 days     levothyroxine 112 MCG tablet  Commonly known as:  SYNTHROID, LEVOTHROID  Take 112 mcg by mouth daily before breakfast.     metFORMIN 500 MG tablet  Commonly known as:  GLUCOPHAGE  Take 1 tablet (500 mg total) by mouth 2 (two) times daily with a meal.     NEURONTIN 100 MG capsule  Generic drug:  gabapentin  Take 100 mg by mouth 3 (three) times daily.     nystatin 100000 UNIT/ML suspension  Commonly known as:  MYCOSTATIN  Take 5 mLs (500,000 Units total) by mouth 4 (four) times daily.     pantoprazole 40 MG tablet  Commonly known as:  PROTONIX  Take 40 mg by mouth daily.     predniSONE 10 MG tablet  Commonly known as:  DELTASONE  - Prednisone dosing: Take  Prednisone 40mg  (4 tabs) x  3 days, then taper to 30mg  (3 tabs) x 3 days, then 20mg  (2 tabs) x 3days, then 10mg  (1 tab) x 3days, then OFF.  -   - Dispense:  30 tabs, refills: None     ramipril 10 MG capsule  Commonly known as:  ALTACE  Take 10 mg by mouth daily.     simvastatin 40 MG tablet  Commonly known as:  ZOCOR  Take 40 mg by mouth daily at 6 PM.     sodium chloride 0.65 % Soln nasal spray  Commonly known as:  OCEAN  Place 1 spray into both nostrils as needed for congestion (also available OTC).     theophylline 200 MG 12 hr tablet  Commonly known as:  THEODUR  Take 400 mg by mouth 2 (two) times daily.     vitamin C 1000 MG tablet  Take 1,000 mg by mouth daily.     vitamin E 1000 UNIT capsule  Take 1,000 Units by mouth daily.         Brief H and P: For complete details please refer to admission H and P, but in brief Patient is a 58 year old female with history of asthma/COPD on 4 L O2 via nasal cannula at home, chronic diastolic CHF, chronic atrial fibrillation, diabetes type 2, chronic pain syndrome, anxiety on narcotics presented to the ER with worsening shortness of breath, wheezing, coughing for the last 2 days.  Patient was recently discharged on 11/30/13 after having COPD exacerbation. Patient reports that she was improving until 2 days prior to admission when she started having more shortness of breath and coughing.  She also reported chronic abdominal pain and back pain. Patient reported that she was doing better on 4 L O2 via nasal cannula however once she was weaned down on her steroids and O2 to 3 L, her symptoms got worse. She has also stopped smoking since the previous admission.   Hospital Course:  Acute respiratory failure likely secondary to  1) COPD/asthma exacerbation : Significantly improved from admission. Recurrent admissions, unclear triggers although patient was trying to wean herself down from 4 L O2 and prednisone which may have triggered this exacerbation. Patient was placed  on 2 L O2 via nasal cannula which she is on at home, scheduled nebs, she was transitioned to oral prednisone and placed on slow taper for discharge. Patient is also placed on Levaquin. She was recommended to followup with her pulmonologist, Dr. Rinaldo Cloud at  [facility]. Patient was ambulating in the hallway without any difficulty at the time discharge.  2) mild acute on chronic diastolic CHF exacerbation : Significantly improved, patient was placed on IV diuresis and had a total of 5.7 L output since admission. Patient developed contraction alkalosis due to IV diuresis hence Lasix was held today. She will start on Lasix at her home dose on 12/23/2013. 2-D echo in 10 2014 had shown EF of 16-10%, grade 2 diastolic dysfunction. She has an appointment with cardiology on 02/05/2014.   Hypothyroidism:  - continue Synthroid   Type 2 diabetes mellitus : Uncontrolled Likely due to steroids patient was placed on sliding scale insulin and meal coverage. Hgba1c is 6.9.  Patient is not on any oral hypoglycemics,  hence placed on metformin for out-patient settings esp she is going to be on steroids   Essential hypertension, benign  on lisinopril.   Atrial fibrillation - rate controlled. Continue Cardizem, eliquis   Day of Discharge BP 148/84  Pulse 85  Temp(Src) 97.9 F (36.6 C) (Oral)  Resp 20  Ht 5' (1.524 m)  Wt 87 kg (191 lb 12.8 oz)  BMI 37.46 kg/m2  SpO2 95%  Physical Exam: General: Alert and awake oriented x3 not in any acute distress. CVS: S1-S2 clear no murmur rubs or gallops Chest: Mild scattered wheezing, improving Abdomen: soft nontender, nondistended, normal bowel sounds Extremities: no cyanosis, clubbing or edema noted bilaterally    The results of significant diagnostics from this hospitalization (including imaging, microbiology, ancillary and laboratory) are listed below for reference.    LAB RESULTS: Basic Metabolic Panel:  Recent Labs Lab 12/21/13 0535 12/22/13 0632   NA 141 142  K 4.1 3.6*  CL 94* 91*  CO2 36* 40*  GLUCOSE 270* 250*  BUN 29* 33*  CREATININE 0.70 0.79  CALCIUM 10.0 10.1   Liver Function Tests:  Recent Labs Lab 12/18/13 0838  AST 10  ALT 10  ALKPHOS 78  BILITOT 0.4  PROT 6.5  ALBUMIN 2.6*   No results found for this basename: LIPASE, AMYLASE,  in the last 168 hours No results found for this basename: AMMONIA,  in the last 168 hours CBC:  Recent Labs Lab 12/18/13 0838 12/19/13 0418  WBC 14.5* 7.5  NEUTROABS 12.6*  --   HGB 12.6 13.0  HCT 39.7 41.0  MCV 80.2 79.3  PLT 116* 177   Cardiac Enzymes:  Recent Labs Lab 12/18/13 1839 12/19/13 0004  TROPONINI <0.30 <0.30   BNP: No components found with this basename: POCBNP,  CBG:  Recent Labs Lab 12/21/13 1707 12/22/13 0822  GLUCAP 159* 175*    Significant Diagnostic Studies:  Dg Chest Portable 1 View  12/18/2013   CLINICAL DATA:  Shortness of breath, hypoxia  EXAM: PORTABLE CHEST - 1 VIEW  COMPARISON:  11/27/2013  FINDINGS: Increased interstitial markings, favored to reflect mild interstitial edema. Possible small left pleural effusion. No pneumothorax.  The heart is normal in size.  IMPRESSION: Suspected mild interstitial edema.  Possible small left pleural effusion.   Electronically Signed   By: Julian Hy M.D.   On: 12/18/2013 08:21       Disposition and Follow-up: Discharge Orders   Future Appointments Provider Department Dept Phone   02/05/2014 1:20 PM Satira Sark, MD [place] [phone]   Future Orders Complete By Expires   Diet - low sodium heart healthy  As directed    Diet Carb Modified  As directed    Discharge instructions  As directed    Comments:     Please continue breathing treatments 4 times a day, O2 via nasal cannula 4 L. Do not wean oxygen on your own, you must consult your pulmonologist prior to weaning oxygen.  You have been started on metformin for diabetes. Please discuss with your primary  doctor at the followup appointment.   Increase activity slowly  As directed        DISPOSITION: Home DIET: Carb modified diet   DISCHARGE FOLLOW-UP Follow-up Information   Follow up with [facility].   Contact information:   [place] [address] [phone]      Follow up with Glo Herring., MD. Schedule an appointment as soon as possible for a visit in 10 days. (for hospital follow-up)    Specialty:  Internal Medicine   Contact information:   [address] [place] [address] [phone]       Follow up with Dr Rinaldo Cloud. . Schedule an appointment as soon as possible for a visit in 2 weeks. (for hospital follow-up)    Contact information:   Pulmonology at [facility]       Time spent on Discharge: 40 mins  Signed:   RAI,RIPUDEEP M.D. [facility] 12/22/2013, 10:31 AM Pager: [phone]

## 2013-12-22 NOTE — Discharge Planning (Signed)
Pt stated they were ready to go home and in no pain.  Pt's IV removed and pt given DC papers and education with family in room.  Pt told of FU appointments and given GOLD card # [id number] at DC.  Given scripts and educated COPD complications that would cause need to call the doctor or return to the hospital.  Pt will be wheeled to the car by PCT and family when ready.

## 2013-12-22 NOTE — Progress Notes (Signed)
CRITICAL VALUE ALERT  Critical value received: CO2 is 40  Date of notification: 12/22/2013  Time of notification:  0715  Critical value read back:yes  Nurse who received alert:  L. Corine Shelter, RN  MD notified (1st page):  Dr. Tana Coast  Time of first page:  0720  MD notified (2nd page):  Time of second page:  Responding MD:  Dr. Tana Coast  Time MD responded:  [phone]

## 2013-12-25 LAB — GLUCOSE, CAPILLARY: Glucose-Capillary: 235 mg/dL — ABNORMAL HIGH (ref 70–99)

## 2014-01-17 ENCOUNTER — Encounter (HOSPITAL_COMMUNITY): Payer: Medicaid Other

## 2014-01-17 ENCOUNTER — Encounter (HOSPITAL_COMMUNITY): Payer: Self-pay | Admitting: Emergency Medicine

## 2014-01-17 ENCOUNTER — Emergency Department (HOSPITAL_COMMUNITY): Payer: Medicaid Other

## 2014-01-17 ENCOUNTER — Inpatient Hospital Stay (HOSPITAL_COMMUNITY): Payer: Medicaid Other

## 2014-01-17 ENCOUNTER — Inpatient Hospital Stay (HOSPITAL_COMMUNITY)
Admission: EM | Admit: 2014-01-17 | Discharge: 2014-01-22 | DRG: 291 | Disposition: A | Discharge status: Home Health | Payer: Medicaid Other | Attending: Internal Medicine | Admitting: Internal Medicine

## 2014-01-17 DIAGNOSIS — Z833 Family history of diabetes mellitus: Secondary | ICD-10-CM

## 2014-01-17 DIAGNOSIS — I509 Heart failure, unspecified: Secondary | ICD-10-CM

## 2014-01-17 DIAGNOSIS — I4891 Unspecified atrial fibrillation: Secondary | ICD-10-CM

## 2014-01-17 DIAGNOSIS — J45901 Unspecified asthma with (acute) exacerbation: Secondary | ICD-10-CM

## 2014-01-17 DIAGNOSIS — Z87891 Personal history of nicotine dependence: Secondary | ICD-10-CM

## 2014-01-17 DIAGNOSIS — J45909 Unspecified asthma, uncomplicated: Secondary | ICD-10-CM

## 2014-01-17 DIAGNOSIS — J441 Chronic obstructive pulmonary disease with (acute) exacerbation: Secondary | ICD-10-CM

## 2014-01-17 DIAGNOSIS — R0600 Dyspnea, unspecified: Secondary | ICD-10-CM

## 2014-01-17 DIAGNOSIS — E039 Hypothyroidism, unspecified: Secondary | ICD-10-CM

## 2014-01-17 DIAGNOSIS — J9610 Chronic respiratory failure, unspecified whether with hypoxia or hypercapnia: Secondary | ICD-10-CM

## 2014-01-17 DIAGNOSIS — I5032 Chronic diastolic (congestive) heart failure: Secondary | ICD-10-CM

## 2014-01-17 DIAGNOSIS — J4489 Other specified chronic obstructive pulmonary disease: Secondary | ICD-10-CM

## 2014-01-17 DIAGNOSIS — J189 Pneumonia, unspecified organism: Secondary | ICD-10-CM

## 2014-01-17 DIAGNOSIS — I959 Hypotension, unspecified: Secondary | ICD-10-CM

## 2014-01-17 DIAGNOSIS — M549 Dorsalgia, unspecified: Secondary | ICD-10-CM

## 2014-01-17 DIAGNOSIS — I10 Essential (primary) hypertension: Secondary | ICD-10-CM

## 2014-01-17 DIAGNOSIS — J9620 Acute and chronic respiratory failure, unspecified whether with hypoxia or hypercapnia: Secondary | ICD-10-CM

## 2014-01-17 DIAGNOSIS — M545 Low back pain, unspecified: Secondary | ICD-10-CM | POA: Diagnosis present

## 2014-01-17 DIAGNOSIS — F17210 Nicotine dependence, cigarettes, uncomplicated: Secondary | ICD-10-CM

## 2014-01-17 DIAGNOSIS — J449 Chronic obstructive pulmonary disease, unspecified: Secondary | ICD-10-CM

## 2014-01-17 DIAGNOSIS — I252 Old myocardial infarction: Secondary | ICD-10-CM

## 2014-01-17 DIAGNOSIS — J9600 Acute respiratory failure, unspecified whether with hypoxia or hypercapnia: Secondary | ICD-10-CM

## 2014-01-17 DIAGNOSIS — F3289 Other specified depressive episodes: Secondary | ICD-10-CM

## 2014-01-17 DIAGNOSIS — K5900 Constipation, unspecified: Secondary | ICD-10-CM | POA: Diagnosis present

## 2014-01-17 DIAGNOSIS — I5031 Acute diastolic (congestive) heart failure: Secondary | ICD-10-CM

## 2014-01-17 DIAGNOSIS — E669 Obesity, unspecified: Secondary | ICD-10-CM | POA: Diagnosis present

## 2014-01-17 DIAGNOSIS — E119 Type 2 diabetes mellitus without complications: Secondary | ICD-10-CM

## 2014-01-17 DIAGNOSIS — Z23 Encounter for immunization: Secondary | ICD-10-CM

## 2014-01-17 DIAGNOSIS — I5033 Acute on chronic diastolic (congestive) heart failure: Principal | ICD-10-CM

## 2014-01-17 DIAGNOSIS — Z794 Long term (current) use of insulin: Secondary | ICD-10-CM

## 2014-01-17 DIAGNOSIS — E785 Hyperlipidemia, unspecified: Secondary | ICD-10-CM

## 2014-01-17 DIAGNOSIS — I3139 Other pericardial effusion (noninflammatory): Secondary | ICD-10-CM

## 2014-01-17 DIAGNOSIS — Z6835 Body mass index (BMI) 35.0-35.9, adult: Secondary | ICD-10-CM

## 2014-01-17 DIAGNOSIS — I313 Pericardial effusion (noninflammatory): Secondary | ICD-10-CM

## 2014-01-17 DIAGNOSIS — D72829 Elevated white blood cell count, unspecified: Secondary | ICD-10-CM

## 2014-01-17 DIAGNOSIS — Z79899 Other long term (current) drug therapy: Secondary | ICD-10-CM

## 2014-01-17 DIAGNOSIS — F329 Major depressive disorder, single episode, unspecified: Secondary | ICD-10-CM

## 2014-01-17 DIAGNOSIS — Z9981 Dependence on supplemental oxygen: Secondary | ICD-10-CM

## 2014-01-17 LAB — URINALYSIS, ROUTINE W REFLEX MICROSCOPIC
Bilirubin Urine: NEGATIVE
Glucose, UA: NEGATIVE mg/dL
HGB URINE DIPSTICK: NEGATIVE
KETONES UR: NEGATIVE mg/dL
Leukocytes, UA: NEGATIVE
NITRITE: NEGATIVE
Protein, ur: NEGATIVE mg/dL
Specific Gravity, Urine: 1.02 (ref 1.005–1.030)
Urobilinogen, UA: 0.2 mg/dL (ref 0.0–1.0)
pH: 5 (ref 5.0–8.0)

## 2014-01-17 LAB — CBC WITH DIFFERENTIAL/PLATELET
Basophils Absolute: 0 10*3/uL (ref 0.0–0.1)
Basophils Relative: 0 % (ref 0–1)
Eosinophils Absolute: 0.2 10*3/uL (ref 0.0–0.7)
Eosinophils Relative: 3 % (ref 0–5)
HEMATOCRIT: 33.3 % — AB (ref 36.0–46.0)
HEMOGLOBIN: 10.6 g/dL — AB (ref 12.0–15.0)
LYMPHS ABS: 1.4 10*3/uL (ref 0.7–4.0)
LYMPHS PCT: 22 % (ref 12–46)
MCH: 25.5 pg — ABNORMAL LOW (ref 26.0–34.0)
MCHC: 31.8 g/dL (ref 30.0–36.0)
MCV: 80 fL (ref 78.0–100.0)
MONO ABS: 0.6 10*3/uL (ref 0.1–1.0)
Monocytes Relative: 9 % (ref 3–12)
NEUTROS ABS: 4.3 10*3/uL (ref 1.7–7.7)
Neutrophils Relative %: 66 % (ref 43–77)
Platelets: 252 10*3/uL (ref 150–400)
RBC: 4.16 MIL/uL (ref 3.87–5.11)
RDW: 15.8 % — AB (ref 11.5–15.5)
WBC: 6.5 10*3/uL (ref 4.0–10.5)

## 2014-01-17 LAB — THEOPHYLLINE LEVEL: Theophylline Lvl: 18.7 ug/mL (ref 10.0–20.0)

## 2014-01-17 LAB — BASIC METABOLIC PANEL
BUN: 20 mg/dL (ref 6–23)
CALCIUM: 9 mg/dL (ref 8.4–10.5)
CHLORIDE: 93 meq/L — AB (ref 96–112)
CO2: 30 meq/L (ref 19–32)
Creatinine, Ser: 1.03 mg/dL (ref 0.50–1.10)
GFR calc Af Amer: 69 mL/min — ABNORMAL LOW (ref >90)
GFR calc non Af Amer: 59 mL/min — ABNORMAL LOW (ref >90)
Glucose, Bld: 139 mg/dL — ABNORMAL HIGH (ref 70–99)
Potassium: 4.1 mEq/L (ref 3.7–5.3)
Sodium: 134 mEq/L — ABNORMAL LOW (ref 137–147)

## 2014-01-17 LAB — INFLUENZA PANEL BY PCR (TYPE A & B)
H1N1 flu by pcr: NOT DETECTED
INFLBPCR: NEGATIVE
Influenza A By PCR: NEGATIVE

## 2014-01-17 LAB — GLUCOSE, CAPILLARY
GLUCOSE-CAPILLARY: 251 mg/dL — AB (ref 70–99)
GLUCOSE-CAPILLARY: 185 mg/dL — AB (ref 70–99)

## 2014-01-17 LAB — PRO B NATRIURETIC PEPTIDE: Pro B Natriuretic peptide (BNP): 1249 pg/mL — ABNORMAL HIGH (ref 0–125)

## 2014-01-17 LAB — TROPONIN I: Troponin I: <0.3 ng/mL (ref <0.30)

## 2014-01-17 MED ORDER — SODIUM CHLORIDE 0.9 % IJ SOLN: 3.0000 mL | INTRAMUSCULAR | Status: DC | PRN

## 2014-01-17 MED ORDER — GUAIFENESIN ER 600 MG PO TB12
1200.0000 mg | ORAL_TABLET | Freq: Two times a day (BID) | ORAL | Status: DC
  Administered 2014-01-17 – 2014-01-22 (×11): 1200 mg via ORAL
  Filled 2014-01-17 (×11): qty 2

## 2014-01-17 MED ORDER — HYDROCODONE-HOMATROPINE 5-1.5 MG/5ML PO SYRP
5.0000 mL | ORAL_SOLUTION | Freq: Four times a day (QID) | ORAL | Status: DC | PRN
  Administered 2014-01-19 – 2014-01-21 (×4): 5 mL via ORAL
  Filled 2014-01-17 (×4): qty 5

## 2014-01-17 MED ORDER — IPRATROPIUM-ALBUTEROL 0.5-2.5 (3) MG/3ML IN SOLN
3.0000 mL | Freq: Once | RESPIRATORY_TRACT | Status: AC
  Administered 2014-01-17: 3 mL via RESPIRATORY_TRACT
  Filled 2014-01-17: qty 3

## 2014-01-17 MED ORDER — INFLUENZA VAC SPLIT QUAD 0.5 ML IM SUSP
0.5000 mL | INTRAMUSCULAR | Status: AC
  Administered 2014-01-19: 0.5 mL via INTRAMUSCULAR
  Filled 2014-01-17: qty 0.5

## 2014-01-17 MED ORDER — FUROSEMIDE 40 MG PO TABS
40.0000 mg | ORAL_TABLET | Freq: Every day | ORAL | Status: DC
  Administered 2014-01-17 – 2014-01-20 (×4): 40 mg via ORAL
  Filled 2014-01-17 (×4): qty 1

## 2014-01-17 MED ORDER — ACETAMINOPHEN 325 MG PO TABS: 650.0000 mg | ORAL_TABLET | Freq: Four times a day (QID) | ORAL | Status: DC | PRN

## 2014-01-17 MED ORDER — SODIUM CHLORIDE 0.9 % IJ SOLN
10.0000 mL | Freq: Two times a day (BID) | INTRAMUSCULAR | Status: DC
  Administered 2014-01-17 – 2014-01-22 (×4): 10 mL

## 2014-01-17 MED ORDER — IPRATROPIUM BROMIDE 0.02 % IN SOLN
RESPIRATORY_TRACT | Status: AC
  Administered 2014-01-17: 0.5 mg
  Filled 2014-01-17: qty 2.5

## 2014-01-17 MED ORDER — OXYCODONE-ACETAMINOPHEN 5-325 MG PO TABS
1.0000 | ORAL_TABLET | Freq: Four times a day (QID) | ORAL | Status: DC | PRN
  Administered 2014-01-17 – 2014-01-20 (×7): 1 via ORAL
  Filled 2014-01-17 (×7): qty 1

## 2014-01-17 MED ORDER — ONDANSETRON HCL 4 MG/2ML IJ SOLN: 4.0000 mg | Freq: Four times a day (QID) | INTRAMUSCULAR | Status: DC | PRN

## 2014-01-17 MED ORDER — LEVALBUTEROL HCL 0.63 MG/3ML IN NEBU
0.6300 mg | INHALATION_SOLUTION | RESPIRATORY_TRACT | Status: DC
  Administered 2014-01-17 – 2014-01-20 (×18): 0.63 mg via RESPIRATORY_TRACT
  Filled 2014-01-17 (×20): qty 3

## 2014-01-17 MED ORDER — SODIUM CHLORIDE 0.9 % IV SOLN
250.0000 mL | INTRAVENOUS | Status: DC | PRN
  Administered 2014-01-17: 17:00:00 via INTRAVENOUS
  Administered 2014-01-21: 250 mL via INTRAVENOUS

## 2014-01-17 MED ORDER — PANTOPRAZOLE SODIUM 40 MG PO TBEC
40.0000 mg | DELAYED_RELEASE_TABLET | Freq: Every day | ORAL | Status: DC
  Administered 2014-01-18 – 2014-01-22 (×5): 40 mg via ORAL
  Filled 2014-01-17 (×5): qty 1

## 2014-01-17 MED ORDER — ALBUTEROL SULFATE (2.5 MG/3ML) 0.083% IN NEBU
2.5000 mg | INHALATION_SOLUTION | Freq: Once | RESPIRATORY_TRACT | Status: AC
  Administered 2014-01-17: 2.5 mg via RESPIRATORY_TRACT
  Filled 2014-01-17: qty 3

## 2014-01-17 MED ORDER — ONDANSETRON HCL 4 MG PO TABS: 4.0000 mg | ORAL_TABLET | Freq: Four times a day (QID) | ORAL | Status: DC | PRN

## 2014-01-17 MED ORDER — BUDESONIDE-FORMOTEROL FUMARATE 160-4.5 MCG/ACT IN AERO
2.0000 | INHALATION_SPRAY | Freq: Two times a day (BID) | RESPIRATORY_TRACT | Status: DC
  Administered 2014-01-17 – 2014-01-22 (×10): 2 via RESPIRATORY_TRACT
  Filled 2014-01-17: qty 6

## 2014-01-17 MED ORDER — CITALOPRAM HYDROBROMIDE 20 MG PO TABS
10.0000 mg | ORAL_TABLET | Freq: Every day | ORAL | Status: DC
  Administered 2014-01-18 – 2014-01-22 (×5): 10 mg via ORAL
  Filled 2014-01-17 (×5): qty 1

## 2014-01-17 MED ORDER — SODIUM CHLORIDE 0.9 % IJ SOLN
3.0000 mL | Freq: Two times a day (BID) | INTRAMUSCULAR | Status: DC
  Administered 2014-01-17 – 2014-01-20 (×3): 3 mL via INTRAVENOUS

## 2014-01-17 MED ORDER — SODIUM CHLORIDE 0.9 % IJ SOLN
10.0000 mL | INTRAMUSCULAR | Status: DC | PRN
  Administered 2014-01-17: 40 mL
  Administered 2014-01-17 – 2014-01-22 (×2): 20 mL

## 2014-01-17 MED ORDER — GABAPENTIN 100 MG PO CAPS
100.0000 mg | ORAL_CAPSULE | Freq: Three times a day (TID) | ORAL | Status: DC
  Administered 2014-01-17 – 2014-01-22 (×15): 100 mg via ORAL
  Filled 2014-01-17 (×15): qty 1

## 2014-01-17 MED ORDER — SIMVASTATIN 20 MG PO TABS
40.0000 mg | ORAL_TABLET | Freq: Every day | ORAL | Status: DC
  Administered 2014-01-17 – 2014-01-21 (×5): 40 mg via ORAL
  Filled 2014-01-17 (×5): qty 2

## 2014-01-17 MED ORDER — ALBUTEROL (5 MG/ML) CONTINUOUS INHALATION SOLN
10.0000 mg/h | INHALATION_SOLUTION | Freq: Once | RESPIRATORY_TRACT | Status: AC
  Administered 2014-01-17: 10 mg/h via RESPIRATORY_TRACT
  Filled 2014-01-17: qty 20

## 2014-01-17 MED ORDER — METHYLPREDNISOLONE SODIUM SUCC 125 MG IJ SOLR
60.0000 mg | Freq: Four times a day (QID) | INTRAMUSCULAR | Status: DC
  Administered 2014-01-17 – 2014-01-18 (×4): 60 mg via INTRAVENOUS
  Filled 2014-01-17 (×4): qty 2

## 2014-01-17 MED ORDER — IPRATROPIUM BROMIDE 0.02 % IN SOLN
0.5000 mg | RESPIRATORY_TRACT | Status: DC
  Administered 2014-01-17 – 2014-01-20 (×18): 0.5 mg via RESPIRATORY_TRACT
  Filled 2014-01-17 (×20): qty 2.5

## 2014-01-17 MED ORDER — SODIUM CHLORIDE 0.9 % IV SOLN
INTRAVENOUS | Status: DC
  Administered 2014-01-17: 10:00:00 via INTRAVENOUS

## 2014-01-17 MED ORDER — BIOTENE DRY MOUTH MT LIQD
15.0000 mL | Freq: Two times a day (BID) | OROMUCOSAL | Status: DC
  Administered 2014-01-17 – 2014-01-22 (×7): 15 mL via OROMUCOSAL

## 2014-01-17 MED ORDER — FUROSEMIDE 10 MG/ML IJ SOLN
40.0000 mg | Freq: Once | INTRAMUSCULAR | Status: AC
  Administered 2014-01-17: 40 mg via INTRAVENOUS
  Filled 2014-01-17: qty 4

## 2014-01-17 MED ORDER — INSULIN ASPART 100 UNIT/ML ~~LOC~~ SOLN
0.0000 [IU] | Freq: Three times a day (TID) | SUBCUTANEOUS | Status: DC
  Administered 2014-01-17: 8 [IU] via SUBCUTANEOUS
  Administered 2014-01-18: 5 [IU] via SUBCUTANEOUS
  Administered 2014-01-18 – 2014-01-19 (×3): 3 [IU] via SUBCUTANEOUS
  Administered 2014-01-19: 5 [IU] via SUBCUTANEOUS
  Administered 2014-01-19: 2 [IU] via SUBCUTANEOUS
  Administered 2014-01-20 (×2): 3 [IU] via SUBCUTANEOUS
  Administered 2014-01-20: 2 [IU] via SUBCUTANEOUS
  Administered 2014-01-21: 8 [IU] via SUBCUTANEOUS
  Administered 2014-01-21: 2 [IU] via SUBCUTANEOUS
  Administered 2014-01-22: 3 [IU] via SUBCUTANEOUS

## 2014-01-17 MED ORDER — LEVALBUTEROL HCL 0.63 MG/3ML IN NEBU
0.6300 mg | INHALATION_SOLUTION | RESPIRATORY_TRACT | Status: DC
  Administered 2014-01-17 (×2): 0.63 mg via RESPIRATORY_TRACT
  Filled 2014-01-17 (×2): qty 3

## 2014-01-17 MED ORDER — SODIUM CHLORIDE 0.9 % IV BOLUS (SEPSIS)
250.0000 mL | Freq: Once | INTRAVENOUS | Status: AC
  Administered 2014-01-17: 250 mL via INTRAVENOUS

## 2014-01-17 MED ORDER — LEVOFLOXACIN IN D5W 500 MG/100ML IV SOLN
500.0000 mg | INTRAVENOUS | Status: DC
  Administered 2014-01-17 – 2014-01-18 (×2): 500 mg via INTRAVENOUS
  Filled 2014-01-17 (×3): qty 100

## 2014-01-17 MED ORDER — IPRATROPIUM BROMIDE 0.02 % IN SOLN
1.0000 mg | Freq: Once | RESPIRATORY_TRACT | Status: DC
  Filled 2014-01-17: qty 5

## 2014-01-17 MED ORDER — METHYLPREDNISOLONE SODIUM SUCC 125 MG IJ SOLR
125.0000 mg | Freq: Once | INTRAMUSCULAR | Status: AC
  Administered 2014-01-17: 125 mg via INTRAVENOUS
  Filled 2014-01-17: qty 2

## 2014-01-17 MED ORDER — ALPRAZOLAM 0.5 MG PO TABS
0.5000 mg | ORAL_TABLET | Freq: Three times a day (TID) | ORAL | Status: DC
  Administered 2014-01-17 – 2014-01-19 (×8): 1 mg via ORAL
  Administered 2014-01-20: 0.5 mg via ORAL
  Administered 2014-01-20: 1 mg via ORAL
  Administered 2014-01-20: 0.5 mg via ORAL
  Administered 2014-01-21: 1 mg via ORAL
  Administered 2014-01-21 (×2): 0.5 mg via ORAL
  Administered 2014-01-22: 1 mg via ORAL
  Filled 2014-01-17 (×3): qty 2
  Filled 2014-01-17: qty 1
  Filled 2014-01-17 (×5): qty 2
  Filled 2014-01-17 (×2): qty 1
  Filled 2014-01-17 (×3): qty 2
  Filled 2014-01-17: qty 1

## 2014-01-17 MED ORDER — APIXABAN 5 MG PO TABS
5.0000 mg | ORAL_TABLET | Freq: Two times a day (BID) | ORAL | Status: DC
  Administered 2014-01-17 – 2014-01-22 (×10): 5 mg via ORAL
  Filled 2014-01-17 (×10): qty 1

## 2014-01-17 MED ORDER — ACETAMINOPHEN 650 MG RE SUPP: 650.0000 mg | Freq: Four times a day (QID) | RECTAL | Status: DC | PRN

## 2014-01-17 MED ORDER — LEVALBUTEROL HCL 0.63 MG/3ML IN NEBU: 0.6300 mg | INHALATION_SOLUTION | RESPIRATORY_TRACT | Status: DC | PRN

## 2014-01-17 MED ORDER — INSULIN ASPART 100 UNIT/ML ~~LOC~~ SOLN
0.0000 [IU] | Freq: Every day | SUBCUTANEOUS | Status: DC
  Administered 2014-01-21: 3 [IU] via SUBCUTANEOUS

## 2014-01-17 MED ORDER — IPRATROPIUM BROMIDE 0.02 % IN SOLN
0.5000 mg | Freq: Four times a day (QID) | RESPIRATORY_TRACT | Status: DC
  Administered 2014-01-17 (×2): 0.5 mg via RESPIRATORY_TRACT
  Filled 2014-01-17 (×2): qty 2.5

## 2014-01-17 MED ORDER — LEVOTHYROXINE SODIUM 112 MCG PO TABS
112.0000 ug | ORAL_TABLET | Freq: Every day | ORAL | Status: DC
  Administered 2014-01-18 – 2014-01-22 (×5): 112 ug via ORAL
  Filled 2014-01-17 (×7): qty 1

## 2014-01-17 NOTE — H&P (Signed)
Patient seen and examined. Above note reviewed.  Patient presents to the emergency room with acute on chronic respiratory failure which appears to be multifactorial due to COPD as well as acute on chronic diastolic chf. The patient is chronically on 4L of oxygen for COPD and has a very . She has been taking her lasix as prescribed but family reports that she is frequently drinking water. She reports gradual worsening of dyspnea over the past week.  She has been wheezing, coughing and has diffuse myalgias.  She has a productive cough. Denies any fevers. Family feels that she has been gaining weight.  On arrival to ED she was noted to be hypoxic on 4L.  She was also hypotensive with blood pressures in the 80s-90s.  She does have evidence of volume overload with some LE edema and chest xray findings consistent with pulmonary edema.  She will be admitted to step down unit. It is difficult to ascertain her volume status.  Will place PICC line for CVP readings.  Due to her hypotension, will have to hold off on starting any lasix for now.  Will request cardiology consultation to assist with management of chf, especially in light of her frequent hospitalizations.    She will be continued on steroids, nebs and antibiotics. Will wean down oxygen as tolerated.  The patient is occasionally tremulous, but I feel this may be related to large doses of albuterol she has been receiving.  Domanic Matusek

## 2014-01-17 NOTE — ED Provider Notes (Signed)
CSN: [id number]     Arrival date & time 01/17/14  0803 History   First MD Initiated Contact with Patient 01/17/14 0827     Chief Complaint  Patient presents with  . Shortness of Breath  . Back Pain      HPI Pt was seen at 0835.   Per pt and her family, c/o gradual onset and worsening of persistent cough, wheezing and SOB for the past 3 to 4 days.  Describes her symptoms as "it's either my COPD or heart failure."  Has been using home O2 N/C, MDI and PO lasix without relief. Pt's family states she "gained 6lbs just this morning" per her home scale.  Denies CP/palpitations, no back pain, no abd pain, no N/V/D, no fevers, no rash.    Past Medical History  Diagnosis Date  . Asthma   . COPD (chronic obstructive pulmonary disease)   . Hypothyroidism   . Essential hypertension, benign   . Hyperlipidemia   . Type 2 diabetes mellitus   . Atrial fibrillation   . MI (myocardial infarction)     2012  . Pericardial effusion 09/09/2013  . CHF (congestive heart failure)   . On home O2     4L N/C continuous   Past Surgical History  Procedure Laterality Date  . Hernia repair    . Abdominal hysterectomy    . Stomach surgery      Wound vac currently in place  . Esophagogastroduodenoscopy  11/11/2011    Procedure: ESOPHAGOGASTRODUODENOSCOPY (EGD);  Surgeon: Rogene Houston, MD;  Location: [facility];  Service: Endoscopy;  Laterality: N/A;  . Foreign body removal  11/11/2011    Procedure: FOREIGN BODY REMOVAL;  Surgeon: Rogene Houston, MD;  Location: [facility];  Service: Endoscopy;  Laterality: N/A;  . Tracheal surgery    . Abdominal surgery     Family History  Problem Relation Age of Onset  . Diabetes Mother    History  Substance Use Topics  . Smoking status: Former Smoker -- 1.50 packs/day for 35 years    Types: Cigarettes    Quit date: 11/27/2013  . Smokeless tobacco: Never Used  . Alcohol Use: Yes     Comment: 1-2 beers nightly   OB History   Grav Para Term Preterm  Abortions TAB SAB Ect Mult Living   2 2 2       1      Review of Systems ROS: Statement: All systems negative except as marked or noted in the HPI; Constitutional: Negative for fever and chills. ; ; Eyes: Negative for eye pain, redness and discharge. ; ; ENMT: Negative for ear pain, hoarseness, nasal congestion, sinus pressure and sore throat. ; ; Cardiovascular: Negative for chest pain, palpitations, diaphoresis, +dyspnea, weight gain and peripheral edema. ; ; Respiratory: +cough, wheezing. Negative for stridor. ; ; Gastrointestinal: Negative for nausea, vomiting, diarrhea, abdominal pain, blood in stool, hematemesis, jaundice and rectal bleeding. . ; ; Genitourinary: Negative for dysuria, flank pain and hematuria. ; ; Musculoskeletal: Negative for back pain and neck pain. Negative for swelling and trauma.; ; Skin: Negative for pruritus, rash, abrasions, blisters, bruising and skin lesion.; ; Neuro: Negative for headache, lightheadedness and neck stiffness. Negative for weakness, altered level of consciousness , altered mental status, extremity weakness, paresthesias, involuntary movement, seizure and syncope.      Allergies  Amoxicillin; Bactrim; Erythromycin; Keflex; and Penicillins  Home Medications   Current Outpatient Rx  Name  Route  Sig  Dispense  Refill  . albuterol (PROVENTIL HFA;VENTOLIN HFA) 108 (90 BASE) MCG/ACT inhaler   Inhalation   Inhale 1 puff into the lungs every 4 (four) hours as needed for wheezing or shortness of breath.         Marland Kitchen albuterol (PROVENTIL) 2 MG tablet   Oral   Take 1 mg by mouth 3 (three) times daily.         Marland Kitchen ALPRAZolam (XANAX) 1 MG tablet   Oral   Take 0.5-1 mg by mouth 3 (three) times daily.         Marland Kitchen apixaban (ELIQUIS) 5 MG TABS tablet   Oral   Take 1 tablet (5 mg total) by mouth 2 (two) times daily.   60 tablet   6   . Ascorbic Acid (VITAMIN C) 1000 MG tablet   Oral   Take 1,000 mg by mouth daily.          . benzonatate (TESSALON  PERLES) 100 MG capsule   Oral   Take 1 capsule (100 mg total) by mouth 3 (three) times daily as needed for cough.   30 capsule   0   . bisacodyl (DULCOLAX) 5 MG EC tablet   Oral   Take 5 mg by mouth daily as needed for moderate constipation.         . budesonide-formoterol (SYMBICORT) 160-4.5 MCG/ACT inhaler   Inhalation   Inhale 2 puffs into the lungs.          . citalopram (CELEXA) 10 MG tablet   Oral   Take 10 mg by mouth daily.           Marland Kitchen diltiazem (CARDIZEM CD) 240 MG 24 hr capsule   Oral   Take 1 capsule (240 mg total) by mouth daily.   30 capsule   6   . fexofenadine (ALLEGRA) 180 MG tablet   Oral   Take 180 mg by mouth 2 (two) times daily.         . fluconazole (DIFLUCAN) 150 MG tablet   Oral   Take 1 tablet (150 mg total) by mouth every 3 (three) days as needed (vaginitis/yeast infection).   5 tablet   1   . fluticasone (FLONASE) 50 MCG/ACT nasal spray   Each Nare   Place 1 spray into both nostrils.          . furosemide (LASIX) 20 MG tablet   Oral   Take 2 tablets (40 mg total) by mouth 2 (two) times daily.   30 tablet      . gabapentin (NEURONTIN) 100 MG capsule   Oral   Take 100 mg by mouth 3 (three) times daily.          Marland Kitchen guaiFENesin (MUCINEX) 600 MG 12 hr tablet   Oral   Take 600 mg by mouth 2 (two) times daily as needed for cough.          Marland Kitchen HYDROcodone-acetaminophen (NORCO) 10-325 MG per tablet   Oral   Take 1 tablet by mouth every 12 (twelve) hours as needed for severe pain.   30 tablet   0   . HYDROcodone-homatropine (HYCODAN) 5-1.5 MG/5ML syrup   Oral   Take 5 mLs by mouth every 6 (six) hours as needed for cough.   120 mL   0   . levothyroxine (SYNTHROID, LEVOTHROID) 112 MCG tablet   Oral   Take 112 mcg by mouth daily before breakfast.         . metFORMIN (  GLUCOPHAGE) 500 MG tablet   Oral   Take 1 tablet (500 mg total) by mouth 2 (two) times daily with a meal.   60 tablet   3   . nystatin (MYCOSTATIN)  100000 UNIT/ML suspension   Oral   Take 5 mLs (500,000 Units total) by mouth 4 (four) times daily.   240 mL   4   . oxyCODONE-acetaminophen (PERCOCET/ROXICET) 5-325 MG per tablet   Oral   Take 1 tablet by mouth daily as needed for moderate pain or severe pain.         . pantoprazole (PROTONIX) 40 MG tablet   Oral   Take 40 mg by mouth daily.          . ramipril (ALTACE) 10 MG capsule   Oral   Take 10 mg by mouth daily.         . simvastatin (ZOCOR) 40 MG tablet   Oral   Take 40 mg by mouth daily at 6 PM.          . sodium chloride (OCEAN) 0.65 % SOLN nasal spray   Each Nare   Place 1 spray into both nostrils as needed for congestion (also available OTC).   30 mL   2   . theophylline (THEODUR) 200 MG 12 hr tablet   Oral   Take 400 mg by mouth 2 (two) times daily.         . vitamin E 1000 UNIT capsule   Oral   Take 1,000 Units by mouth daily.          BP 78/32  Pulse 83  Temp(Src) 98.2 F (36.8 C) (Oral)  Resp 16  Ht 5\' 2"  (1.575 m)  Wt 192 lb (87.091 kg)  BMI 35.11 kg/m2  SpO2 94% Filed Vitals:   01/17/14 0927 01/17/14 0929 01/17/14 0950 01/17/14 0954  BP:   64/32 78/32  Pulse: 99  87 83  Temp:      TempSrc:      Resp: 21  21 16   Height:      Weight:      SpO2: 86% 92% 93% 94%    Physical Exam 0840: Physical examination:  Nursing notes reviewed; Vital signs and O2 SAT reviewed;  Constitutional: Well developed, Well nourished, In no acute distress; Head:  Normocephalic, atraumatic; Eyes: EOMI, PERRL, No scleral icterus; ENMT: Mouth and pharynx normal, Mucous membranes dry; Neck: Supple, Full range of motion, No lymphadenopathy; Cardiovascular: Regular rate and rhythm, No gallop; Respiratory: Breath sounds coarse & equal bilaterally, insp/exp wheezes bilat with occasional audible wheezing.  Speaking sentences. Normal respiratory effort/excursion; Chest: Nontender, Movement normal; Abdomen: Soft, Nontender, Nondistended, Normal bowel sounds;  Genitourinary: No CVA tenderness; Extremities: Pulses normal, No tenderness, No edema, No calf edema or asymmetry.; Neuro: AA&Ox3, Major CN grossly intact.  Speech clear. No gross focal motor or sensory deficits in extremities.; Skin: Color normal, Warm, Dry.   ED Course  Procedures   0845:  Pt sitting upright, audible wheezing on arrival, Sats 82% despite pt's usual O2 4L N/C. Has hx of CHF and COPD. Will tx IV solumedrol, IV lasix and hour long neb treatment.  1020:  Sats increasing to 93-94%, lungs continue coarse. SBP dropped from 98 to 60's. Judicious IVF bolus given with gradual improvement. Pt continues to mentate per her baseline, talking to ED staff and family at bedside. Dx and testing d/w pt and family.  Questions answered.  Verb understanding, agreeable to admit.  T/C to [facility].  Memon, case discussed, including:  HPI, pertinent PM/SHx, VS/PE, dx testing, ED course and treatment:  Agreeable to admit, requests to write temporary orders, obtain ICU bed to team 1.   EKG Interpretation    Date/Time:  Wednesday January 17 2014 08:25:13 EST Ventricular Rate:  95 PR Interval:  152 QRS Duration: 70 QT Interval:  344 QTC Calculation: 432 R Axis:   51 Text Interpretation:  Normal sinus rhythm Artifact Low voltage QRS Borderline ECG When compared with ECG of 18-Dec-2013 07:52, No significant change was found Confirmed by [facility]  MD, Nunzio Cory [phone]) on 01/17/2014 8:45:51 AM            MDM  MDM Reviewed: previous chart, nursing note and vitals Reviewed previous: labs and ECG Interpretation: labs, ECG and x-ray Total time providing critical care: 30-74 minutes. This excludes time spent performing separately reportable procedures and services. Consults: admitting MD     CRITICAL CARE Performed by: Alfonzo Feller Total critical care time: 45 Critical care time was exclusive of separately billable procedures and treating other patients. Critical care was necessary to  treat or prevent imminent or life-threatening deterioration. Critical care was time spent personally by me on the following activities: development of treatment plan with patient and/or surrogate as well as nursing, discussions with consultants, evaluation of patient's response to treatment, examination of patient, obtaining history from patient or surrogate, ordering and performing treatments and interventions, ordering and review of laboratory studies, ordering and review of radiographic studies, pulse oximetry and re-evaluation of patient's condition.   Results for orders placed during the hospital encounter of 01/17/14  URINALYSIS, ROUTINE W REFLEX MICROSCOPIC      Result Value Ref Range   Color, Urine YELLOW  YELLOW   APPearance CLEAR  CLEAR   Specific Gravity, Urine 1.020  1.005 - 1.030   pH 5.0  5.0 - 8.0   Glucose, UA NEGATIVE  NEGATIVE mg/dL   Hgb urine dipstick NEGATIVE  NEGATIVE   Bilirubin Urine NEGATIVE  NEGATIVE   Ketones, ur NEGATIVE  NEGATIVE mg/dL   Protein, ur NEGATIVE  NEGATIVE mg/dL   Urobilinogen, UA 0.2  0.0 - 1.0 mg/dL   Nitrite NEGATIVE  NEGATIVE   Leukocytes, UA NEGATIVE  NEGATIVE  PRO B NATRIURETIC PEPTIDE      Result Value Ref Range   Pro B Natriuretic peptide (BNP) 1249.0 (*) 0 - 125 pg/mL  TROPONIN I      Result Value Ref Range   Troponin I <0.30  <0.30 ng/mL  CBC WITH DIFFERENTIAL      Result Value Ref Range   WBC 6.5  4.0 - 10.5 K/uL   RBC 4.16  3.87 - 5.11 MIL/uL   Hemoglobin 10.6 (*) 12.0 - 15.0 g/dL   HCT 33.3 (*) 36.0 - 46.0 %   MCV 80.0  78.0 - 100.0 fL   MCH 25.5 (*) 26.0 - 34.0 pg   MCHC 31.8  30.0 - 36.0 g/dL   RDW 15.8 (*) 11.5 - 15.5 %   Platelets 252  150 - 400 K/uL   Neutrophils Relative % 66  43 - 77 %   Neutro Abs 4.3  1.7 - 7.7 K/uL   Lymphocytes Relative 22  12 - 46 %   Lymphs Abs 1.4  0.7 - 4.0 K/uL   Monocytes Relative 9  3 - 12 %   Monocytes Absolute 0.6  0.1 - 1.0 K/uL   Eosinophils Relative 3  0 - 5 %   Eosinophils  Absolute 0.2  0.0 - 0.7 K/uL   Basophils Relative 0  0 - 1 %   Basophils Absolute 0.0  0.0 - 0.1 K/uL  BASIC METABOLIC PANEL      Result Value Ref Range   Sodium 134 (*) 137 - 147 mEq/L   Potassium 4.1  3.7 - 5.3 mEq/L   Chloride 93 (*) 96 - 112 mEq/L   CO2 30  19 - 32 mEq/L   Glucose, Bld 139 (*) 70 - 99 mg/dL   BUN 20  6 - 23 mg/dL   Creatinine, Ser 1.03  0.50 - 1.10 mg/dL   Calcium 9.0  8.4 - 10.5 mg/dL   GFR calc non Af Amer 59 (*) >90 mL/min   GFR calc Af Amer 69 (*) >90 mL/min   Dg Chest Port 1 View 01/17/2014   CLINICAL DATA:  Short of breath  EXAM: PORTABLE CHEST - 1 VIEW  COMPARISON:  DG CHEST 1V PORT dated 12/20/2013  FINDINGS: Low volumes. Vascular congestion. No consolidation. No pneumothorax. Basilar hypoaeration.  IMPRESSION: Vascular congestion and basilar hypoaeration.   Electronically Signed   By: Maryclare Bean M.D.   On: 01/17/2014 09:02    Results for CIARRAH, PETTERSSON (MRN [id number]) as of 01/17/2014 10:51  Ref. Range 11/27/2013 07:17 12/18/2013 08:38 12/20/2013 06:15 01/17/2014 08:59  Pro B Natriuretic peptide (BNP) Latest Range: 0-125 pg/mL 916.9 (H) 258.8 (H) 1475.0 (H) 1249.0 (H)     Alfonzo Feller, DO 01/18/14 1907

## 2014-01-17 NOTE — ED Notes (Signed)
Pt c/o sob x 4 days with prod cough with white phlegm. Always on 4l 02 [place] and cont in ED. Pt c/o pain to right mid back radiating around that is worse with coughing. Alert/oriented to most. Son at bedside. Son states mild confusion normal for her. Son states pt weighed 174 and today weighs 192 lbs.

## 2014-01-17 NOTE — ED Notes (Signed)
Pt being evaluated for admission 

## 2014-01-17 NOTE — Consult Note (Signed)
CARDIOLOGY CONSULT NOTE  Patient ID: Joanna Perez MRN: [id number] DOB/AGE: March 14, 1956 58 y.o.  Admit date: 01/17/2014 Primary Physician Glo Herring., MD  Reason for Consultation: ADCHF  HPI: The patient is a 58 yr old woman with a PMH significant for chronic diastolic heart failure, HTN, paroxysmal atrial fibrillation, chronic hypoxic respiratory failure due to COPD, hyperlipidemia, and diabetes mellitus. Her last echo (personally reviewed) showed normal LV systolic function, EF 60-73%, with grade II diastolic dysfunction, moderate LA enlargement, dilated IVC, and a trivial pericardial effusion with no hemodynamic sequelae. She has been admitted with increasing dyspnea and a worsening cough over the past 1 week, primarily since this past Sunday. She denies fevers/chills. She has had a cough productive of yellowish/white phlegm. She denies leg swelling. She has significant lower back pain. She has chest discomfort only when coughing. She has felt weak and also complains of diffuse muscle aches. She drinks "about a half gallon of water or more every day". She eats chicken, occasionally steak, and canned green beans, but rinses them to reduce sodium content. She chronically uses 4L of oxygen at home. She apparently sees a pulmonologist in [place].  On presentation to the ED, she was noted to by hypoxic (82% on 4L O2). Chest xray showed "low-grade CHF". She was treated with IV Solu-Medrol, IV Lasix, and nebulized breathing treatments. She became hypotensive and was given a fluid bolus.  A PICC has been placed and nursing reports to me that CVP is 8-10. Pt reports feeling "better than when I first came".  She was recently hospitalized and subsequently discharged on 12/22/2013 for acute on chronic COPD exacerbation and mild diastolic heart failure.    Allergies  Allergen Reactions  . Amoxicillin Hives  . Bactrim [Sulfamethoxazole-Tmp Ds] Hives  . Erythromycin Hives  .  Keflex [Cephalexin] Hives  . Penicillins Itching and Swelling    Sweating    Current Facility-Administered Medications  Medication Dose Route Frequency Provider Last Rate Last Dose  . 0.9 %  sodium chloride infusion  250 mL Intravenous PRN Kathie Dike, MD 10 mL/hr at 01/17/14 1630    . acetaminophen (TYLENOL) tablet 650 mg  650 mg Oral Q6H PRN Kathie Dike, MD       Or  . acetaminophen (TYLENOL) suppository 650 mg  650 mg Rectal Q6H PRN Kathie Dike, MD      . ALPRAZolam Duanne Moron) tablet 0.5-1 mg  0.5-1 mg Oral TID Kathie Dike, MD      . antiseptic oral rinse (BIOTENE) solution 15 mL  15 mL Mouth Rinse BID Kathie Dike, MD      . apixaban (ELIQUIS) tablet 5 mg  5 mg Oral BID Kathie Dike, MD      . budesonide-formoterol (SYMBICORT) 160-4.5 MCG/ACT inhaler 2 puff  2 puff Inhalation BID Kathie Dike, MD      . citalopram (CELEXA) tablet 10 mg  10 mg Oral Daily Kathie Dike, MD      . gabapentin (NEURONTIN) capsule 100 mg  100 mg Oral TID Kathie Dike, MD      . guaiFENesin (MUCINEX) 12 hr tablet 1,200 mg  1,200 mg Oral BID Kathie Dike, MD   1,200 mg at 01/17/14 1307  . HYDROcodone-homatropine (HYCODAN) 5-1.5 MG/5ML syrup 5 mL  5 mL Oral Q6H PRN Kathie Dike, MD      . Derrill Memo ON 01/18/2014] influenza vac split quadrivalent PF (FLUARIX) injection 0.5 mL  0.5 mL Intramuscular Tomorrow-1000 Kathie Dike, MD      .  insulin aspart (novoLOG) injection 0-15 Units  0-15 Units Subcutaneous TID WC Kathie Dike, MD      . insulin aspart (novoLOG) injection 0-5 Units  0-5 Units Subcutaneous QHS Kathie Dike, MD      . ipratropium (ATROVENT) nebulizer solution 0.5 mg  0.5 mg Nebulization Q6H Kathie Dike, MD   0.5 mg at 01/17/14 1507  . ipratropium (ATROVENT) nebulizer solution 1 mg  1 mg Nebulization Once Alfonzo Feller, DO      . levalbuterol [facility]) nebulizer solution 0.63 mg  0.63 mg Nebulization Q4H Kathie Dike, MD   0.63 mg at 01/17/14 1510  . levalbuterol  (XOPENEX) nebulizer solution 0.63 mg  0.63 mg Nebulization Q2H PRN Kathie Dike, MD      . levofloxacin (LEVAQUIN) IVPB 500 mg  500 mg Intravenous Q24H Kathie Dike, MD   500 mg at 01/17/14 1630  . [START ON 01/18/2014] levothyroxine (SYNTHROID, LEVOTHROID) tablet 112 mcg  112 mcg Oral QAC breakfast Kathie Dike, MD      . methylPREDNISolone sodium succinate (SOLU-MEDROL) 125 mg/2 mL injection 60 mg  60 mg Intravenous Q6H Kathie Dike, MD   60 mg at 01/17/14 1307  . ondansetron (ZOFRAN) tablet 4 mg  4 mg Oral Q6H PRN Kathie Dike, MD       Or  . ondansetron (ZOFRAN) injection 4 mg  4 mg Intravenous Q6H PRN Kathie Dike, MD      . oxyCODONE-acetaminophen (PERCOCET/ROXICET) 5-325 MG per tablet 1 tablet  1 tablet Oral Q6H PRN Kathie Dike, MD      . pantoprazole (PROTONIX) EC tablet 40 mg  40 mg Oral Daily Kathie Dike, MD      . simvastatin (ZOCOR) tablet 40 mg  40 mg Oral q1800 Kathie Dike, MD      . sodium chloride 0.9 % injection 10-40 mL  10-40 mL Intracatheter Q12H Kathie Dike, MD      . sodium chloride 0.9 % injection 10-40 mL  10-40 mL Intracatheter PRN Kathie Dike, MD   20 mL at 01/17/14 1630  . sodium chloride 0.9 % injection 3 mL  3 mL Intravenous Q12H Kathie Dike, MD      . sodium chloride 0.9 % injection 3 mL  3 mL Intravenous PRN Kathie Dike, MD        Past Medical History  Diagnosis Date  . Asthma   . COPD (chronic obstructive pulmonary disease)   . Hypothyroidism   . Essential hypertension, benign   . Hyperlipidemia   . Type 2 diabetes mellitus   . Atrial fibrillation   . MI (myocardial infarction)     2012  . Pericardial effusion 09/09/2013  . CHF (congestive heart failure)   . On home O2     4L N/C continuous    Past Surgical History  Procedure Laterality Date  . Hernia repair    . Abdominal hysterectomy    . Stomach surgery      Wound vac currently in place  . Esophagogastroduodenoscopy  11/11/2011    Procedure:  ESOPHAGOGASTRODUODENOSCOPY (EGD);  Surgeon: Rogene Houston, MD;  Location: [facility];  Service: Endoscopy;  Laterality: N/A;  . Foreign body removal  11/11/2011    Procedure: FOREIGN BODY REMOVAL;  Surgeon: Rogene Houston, MD;  Location: [facility];  Service: Endoscopy;  Laterality: N/A;  . Tracheal surgery    . Abdominal surgery      History   Social History  . Marital Status: Legally Separated    Spouse  Name: N/A    Number of Children: N/A  . Years of Education: N/A   Occupational History  . Not on file.   Social History Main Topics  . Smoking status: Former Smoker -- 1.50 packs/day for 35 years    Types: Cigarettes    Quit date: 11/27/2013  . Smokeless tobacco: Never Used  . Alcohol Use: Yes     Comment: 1-2 beers nightly  . Drug Use: No  . Sexual Activity: Not on file   Other Topics Concern  . Not on file   Social History Narrative  . No narrative on file     No family history of premature CAD in 1st degree relatives.  Prior to Admission medications   Medication Sig Start Date End Date Taking? Authorizing Provider  albuterol (PROVENTIL HFA;VENTOLIN HFA) 108 (90 BASE) MCG/ACT inhaler Inhale 1 puff into the lungs every 4 (four) hours as needed for wheezing or shortness of breath.   Yes Historical Provider, MD  albuterol (PROVENTIL) 2 MG tablet Take 1 mg by mouth 3 (three) times daily.   Yes Historical Provider, MD  ALPRAZolam Duanne Moron) 1 MG tablet Take 0.5-1 mg by mouth 3 (three) times daily.   Yes Historical Provider, MD  apixaban (ELIQUIS) 5 MG TABS tablet Take 1 tablet (5 mg total) by mouth 2 (two) times daily. 09/29/13  Yes Lendon Colonel, NP  Ascorbic Acid (VITAMIN C) 1000 MG tablet Take 1,000 mg by mouth daily.    Yes Historical Provider, MD  benzonatate (TESSALON PERLES) 100 MG capsule Take 1 capsule (100 mg total) by mouth 3 (three) times daily as needed for cough. 12/22/13  Yes Ripudeep Krystal Eaton, MD  bisacodyl (DULCOLAX) 5 MG EC tablet Take 5 mg by mouth  daily as needed for moderate constipation.   Yes Historical Provider, MD  budesonide-formoterol (SYMBICORT) 160-4.5 MCG/ACT inhaler Inhale 2 puffs into the lungs.    Yes Historical Provider, MD  citalopram (CELEXA) 10 MG tablet Take 10 mg by mouth daily.     Yes Historical Provider, MD  diltiazem (CARDIZEM CD) 240 MG 24 hr capsule Take 1 capsule (240 mg total) by mouth daily. 09/29/13  Yes Lendon Colonel, NP  fexofenadine (ALLEGRA) 180 MG tablet Take 180 mg by mouth 2 (two) times daily.   Yes Historical Provider, MD  fluconazole (DIFLUCAN) 150 MG tablet Take 1 tablet (150 mg total) by mouth every 3 (three) days as needed (vaginitis/yeast infection). 12/22/13  Yes Ripudeep Krystal Eaton, MD  fluticasone (FLONASE) 50 MCG/ACT nasal spray Place 1 spray into both nostrils.    Yes Historical Provider, MD  furosemide (LASIX) 20 MG tablet Take 2 tablets (40 mg total) by mouth 2 (two) times daily. 12/23/13  Yes Ripudeep Krystal Eaton, MD  gabapentin (NEURONTIN) 100 MG capsule Take 100 mg by mouth 3 (three) times daily.  08/15/13 08/15/14 Yes Historical Provider, MD  guaiFENesin (MUCINEX) 600 MG 12 hr tablet Take 600 mg by mouth 2 (two) times daily as needed for cough.    Yes Historical Provider, MD  HYDROcodone-acetaminophen (NORCO) 10-325 MG per tablet Take 1 tablet by mouth every 12 (twelve) hours as needed for severe pain. 11/30/13  Yes Thurnell Lose, MD  HYDROcodone-homatropine [facility]) 5-1.5 MG/5ML syrup Take 5 mLs by mouth every 6 (six) hours as needed for cough. 12/22/13  Yes Ripudeep Krystal Eaton, MD  levothyroxine (SYNTHROID, LEVOTHROID) 112 MCG tablet Take 112 mcg by mouth daily before breakfast.   Yes Historical Provider, MD  metFORMIN (GLUCOPHAGE)  500 MG tablet Take 1 tablet (500 mg total) by mouth 2 (two) times daily with a meal. 12/22/13  Yes Ripudeep K Rai, MD  nystatin (MYCOSTATIN) 100000 UNIT/ML suspension Take 5 mLs (500,000 Units total) by mouth 4 (four) times daily. 12/22/13  Yes Ripudeep Krystal Eaton, MD    oxyCODONE-acetaminophen (PERCOCET/ROXICET) 5-325 MG per tablet Take 1 tablet by mouth daily as needed for moderate pain or severe pain.   Yes Historical Provider, MD  pantoprazole (PROTONIX) 40 MG tablet Take 40 mg by mouth daily.    Yes Historical Provider, MD  ramipril (ALTACE) 10 MG capsule Take 10 mg by mouth daily.   Yes Historical Provider, MD  simvastatin (ZOCOR) 40 MG tablet Take 40 mg by mouth daily at 6 PM.    Yes Historical Provider, MD  sodium chloride (OCEAN) 0.65 % SOLN nasal spray Place 1 spray into both nostrils as needed for congestion (also available OTC). 12/22/13  Yes Ripudeep Krystal Eaton, MD  theophylline (THEODUR) 200 MG 12 hr tablet Take 400 mg by mouth 2 (two) times daily.   Yes Historical Provider, MD  vitamin E 1000 UNIT capsule Take 1,000 Units by mouth daily.   Yes Historical Provider, MD     Review of systems complete and found to be negative unless listed above in HPI     Physical exam Blood pressure 112/49, pulse 110, temperature 98.2 F (36.8 C), temperature source Oral, resp. rate 20, height 5\' 2"  (1.575 m), weight 192 lb (87.091 kg), SpO2 92.00%. General: NAD, answers appropriately. Neck: No JVD, no thyromegaly or thyroid nodule.  Lungs: Coarse rhonchi bilaterally. CV: Nondisplaced PMI.  Heart regular, tachycardic, HR 110 bpm, normal S1/S2, no S3/S4, no murmur.  No peripheral edema.  No carotid bruit.  Normal pedal pulses.  Abdomen: Soft, nontender, no hepatosplenomegaly, no distention.  Skin: Intact without lesions or rashes.  Neurologic: Alert and oriented x 3.  Psych: Normal affect. Extremities: No clubbing or cyanosis.  HEENT: Normal.   Labs:   Lab Results  Component Value Date   WBC 6.5 01/17/2014   HGB 10.6* 01/17/2014   HCT 33.3* 01/17/2014   MCV 80.0 01/17/2014   PLT 252 01/17/2014    Recent Labs Lab 01/17/14 0859  NA 134*  K 4.1  CL 93*  CO2 30  BUN 20  CREATININE 1.03  CALCIUM 9.0  GLUCOSE 139*   Lab Results  Component Value Date    TROPONINI <0.30 01/17/2014    No results found for this basename: CHOL   No results found for this basename: HDL   No results found for this basename: LDLCALC   No results found for this basename: TRIG   No results found for this basename: CHOLHDL   No results found for this basename: LDLDIRECT       EKG: Sinus rhythm, axis within normal limits, intervals within normal limits, no acute ST-T wave changes. Telemetry: Sinus tachycardia.  Studies: Dg Chest Port 1 View  01/17/2014   CLINICAL DATA:  Status post PICC line placement  EXAM: PORTABLE CHEST - 1 VIEW  COMPARISON:  DG CHEST 1V PORT dated 01/17/2014  FINDINGS: The lungs are well-expanded. There is no evidence of a pneumothorax or pneumothorax. The PICC line tip lies in the region of the junction of the proximal and midportions of the SVC. The pulmonary interstitial markings remain increased. The cardiac silhouette is normal in size. The pulmonary vascularity is mildly prominent centrally.  IMPRESSION: 1. There is no evidence of postprocedure complication  following placement of a right-sided PICC line. 2. There remain increased interstitial markings diffusely within the lungs. This may reflect a low-grade CHF superimposed upon underlying COPD.   Electronically Signed   By: David  Martinique   On: 01/17/2014 15:21   Dg Chest Port 1 View  01/17/2014   CLINICAL DATA:  Short of breath  EXAM: PORTABLE CHEST - 1 VIEW  COMPARISON:  DG CHEST 1V PORT dated 12/20/2013  FINDINGS: Low volumes. Vascular congestion. No consolidation. No pneumothorax. Basilar hypoaeration.  IMPRESSION: Vascular congestion and basilar hypoaeration.   Electronically Signed   By: Maryclare Bean M.D.   On: 01/17/2014 09:02    ASSESSMENT AND PLAN:  1. Acute hypoxic respiratory failure: this appears to be due to a combination of both COPD exacerbation and a very mild degree of diastolic heart failure. She became hypotensive after one dose of Lasix. For the time being, I will simply  restart oral Lasix 40 mg daily (home dose is 40 mg bid). If she appears to be more volume overloaded tomorrow, I would consider a one time IV Lasix dose with close monitoring of her BP. Continue breathing treatments with Atrovent, Xopenex, and Solu-Medrol along with levofloxacin.  She reportedly sees a pulmonologist in [place]. I would consider a pulmonology consultation during this hospitalization, with the hope of closer and more frequent outpatient follow-up. 2. Paroxysmal atrial fibrillation: currently in sinus rhythm/sinus tachycardia, likely related to beta agonist treatments. Continue Eliquis. Diltiazem on hold until BP's remain normal. Could consider restarting 2/26 so as to avoid further diastolic decompensation. 3. Acute on chronic diastolic heart failure: as per #1. Heart failure being driven by significant pulmonary disease and COPD exacerbation. Known grade II diastolic dysfunction. 4. HTN: continue to hold ramipril and diltiazem while monitoring for BP normalization through 2/26. 5. Hyperlipidemia: continue simvastatin.  Signed: Kate Sable, M.D., F.A.C.C.  01/17/2014, 4:59 PM

## 2014-01-17 NOTE — ED Notes (Signed)
C/o shortness of breath, states she had gained 6 pounds per her home scales this am.

## 2014-01-17 NOTE — H&P (Signed)
[facility] History and Physical  Joanna Perez [id number] DOB: 12/18/1955 DOA: 01/17/2014  Referring physician:  PCP: Glo Herring., MD   Chief Complaint:   HPI: Joanna Perez is a 58 y.o. female with past medical history that includes chronic respiratory failure on 4 L of oxygen at home, COPD, diastolic heart failure, diabetes, A. fib since emergency department with chief complaint of shortness of breath. She reports a four-day history of worsening shortness of breath. Of note patient hospitalized 2 times in January for same. He states she did well for "a couple of days and then gradually her respiratory status began to decline. The last 4 days she developed a productive cough with worsening wheezing. Associated symptoms include generalized weakness, decreased stamina, chills and achiness as well as a 6 pound weight gain over the last 4 days . She also has chronic abdominal pain and chronic back pain. She reports the back pain worsening over the last couple of days and her activity is less. She does report not having smoked in the last 2 months. She denies any nausea vomiting. No report of constipation diarrhea. She does describe some urinary urgency but no dysuria. Workup in the emergency department reveals acute on chronic respiratory failure with the oxygen saturation level of 84% on 4 L of oxygen, hypertension, tachycardia, a chest x-ray with vascular congestion, proBNP 1249. She is given Lasix 40 mg IV on hour-long nebulizer treatment and 125 mg of Solu-Medrol. We are asked to admit   Review of Systems:  10 point review of systems complete and all systems are negative except as indicated by history of present illness  Past Medical History  Diagnosis Date  . Asthma   . COPD (chronic obstructive pulmonary disease)   . Hypothyroidism   . Essential hypertension, benign   . Hyperlipidemia   . Type 2 diabetes mellitus   . Atrial fibrillation   . MI (myocardial  infarction)     2012  . Pericardial effusion 09/09/2013  . CHF (congestive heart failure)   . On home O2     4L N/C continuous   Past Surgical History  Procedure Laterality Date  . Hernia repair    . Abdominal hysterectomy    . Stomach surgery      Wound vac currently in place  . Esophagogastroduodenoscopy  11/11/2011    Procedure: ESOPHAGOGASTRODUODENOSCOPY (EGD);  Surgeon: Rogene Houston, MD;  Location: [facility];  Service: Endoscopy;  Laterality: N/A;  . Foreign body removal  11/11/2011    Procedure: FOREIGN BODY REMOVAL;  Surgeon: Rogene Houston, MD;  Location: [facility];  Service: Endoscopy;  Laterality: N/A;  . Tracheal surgery    . Abdominal surgery     Social History:  reports that she quit smoking about 7 weeks ago. Her smoking use included Cigarettes. She has a 52.5 pack-year smoking history. She has never used smokeless tobacco. She reports that she drinks alcohol. She reports that she does not use illicit drugs.  Allergies  Allergen Reactions  . Amoxicillin Hives  . Bactrim [Sulfamethoxazole-Tmp Ds] Hives  . Erythromycin Hives  . Keflex [Cephalexin] Hives  . Penicillins Itching and Swelling    Sweating    Family History  Problem Relation Age of Onset  . Diabetes Mother      Prior to Admission medications   Medication Sig Start Date End Date Taking? Authorizing Provider  albuterol (PROVENTIL HFA;VENTOLIN HFA) 108 (90 BASE) MCG/ACT inhaler Inhale 1 puff into the lungs every  4 (four) hours as needed for wheezing or shortness of breath.   Yes Historical Provider, MD  albuterol (PROVENTIL) 2 MG tablet Take 1 mg by mouth 3 (three) times daily.   Yes Historical Provider, MD  ALPRAZolam Duanne Moron) 1 MG tablet Take 0.5-1 mg by mouth 3 (three) times daily.   Yes Historical Provider, MD  apixaban (ELIQUIS) 5 MG TABS tablet Take 1 tablet (5 mg total) by mouth 2 (two) times daily. 09/29/13  Yes Lendon Colonel, NP  Ascorbic Acid (VITAMIN C) 1000 MG tablet Take 1,000  mg by mouth daily.    Yes Historical Provider, MD  benzonatate (TESSALON PERLES) 100 MG capsule Take 1 capsule (100 mg total) by mouth 3 (three) times daily as needed for cough. 12/22/13  Yes Ripudeep Krystal Eaton, MD  bisacodyl (DULCOLAX) 5 MG EC tablet Take 5 mg by mouth daily as needed for moderate constipation.   Yes Historical Provider, MD  budesonide-formoterol (SYMBICORT) 160-4.5 MCG/ACT inhaler Inhale 2 puffs into the lungs.    Yes Historical Provider, MD  citalopram (CELEXA) 10 MG tablet Take 10 mg by mouth daily.     Yes Historical Provider, MD  diltiazem (CARDIZEM CD) 240 MG 24 hr capsule Take 1 capsule (240 mg total) by mouth daily. 09/29/13  Yes Lendon Colonel, NP  fexofenadine (ALLEGRA) 180 MG tablet Take 180 mg by mouth 2 (two) times daily.   Yes Historical Provider, MD  fluconazole (DIFLUCAN) 150 MG tablet Take 1 tablet (150 mg total) by mouth every 3 (three) days as needed (vaginitis/yeast infection). 12/22/13  Yes Ripudeep Krystal Eaton, MD  fluticasone (FLONASE) 50 MCG/ACT nasal spray Place 1 spray into both nostrils.    Yes Historical Provider, MD  furosemide (LASIX) 20 MG tablet Take 2 tablets (40 mg total) by mouth 2 (two) times daily. 12/23/13  Yes Ripudeep Krystal Eaton, MD  gabapentin (NEURONTIN) 100 MG capsule Take 100 mg by mouth 3 (three) times daily.  08/15/13 08/15/14 Yes Historical Provider, MD  guaiFENesin (MUCINEX) 600 MG 12 hr tablet Take 600 mg by mouth 2 (two) times daily as needed for cough.    Yes Historical Provider, MD  HYDROcodone-acetaminophen (NORCO) 10-325 MG per tablet Take 1 tablet by mouth every 12 (twelve) hours as needed for severe pain. 11/30/13  Yes Thurnell Lose, MD  HYDROcodone-homatropine [facility]) 5-1.5 MG/5ML syrup Take 5 mLs by mouth every 6 (six) hours as needed for cough. 12/22/13  Yes Ripudeep Krystal Eaton, MD  levothyroxine (SYNTHROID, LEVOTHROID) 112 MCG tablet Take 112 mcg by mouth daily before breakfast.   Yes Historical Provider, MD  metFORMIN (GLUCOPHAGE) 500 MG  tablet Take 1 tablet (500 mg total) by mouth 2 (two) times daily with a meal. 12/22/13  Yes Ripudeep K Rai, MD  nystatin (MYCOSTATIN) 100000 UNIT/ML suspension Take 5 mLs (500,000 Units total) by mouth 4 (four) times daily. 12/22/13  Yes Ripudeep Krystal Eaton, MD  oxyCODONE-acetaminophen (PERCOCET/ROXICET) 5-325 MG per tablet Take 1 tablet by mouth daily as needed for moderate pain or severe pain.   Yes Historical Provider, MD  pantoprazole (PROTONIX) 40 MG tablet Take 40 mg by mouth daily.    Yes Historical Provider, MD  ramipril (ALTACE) 10 MG capsule Take 10 mg by mouth daily.   Yes Historical Provider, MD  simvastatin (ZOCOR) 40 MG tablet Take 40 mg by mouth daily at 6 PM.    Yes Historical Provider, MD  sodium chloride (OCEAN) 0.65 % SOLN nasal spray Place 1 spray into both nostrils  as needed for congestion (also available OTC). 12/22/13  Yes Ripudeep Krystal Eaton, MD  theophylline (THEODUR) 200 MG 12 hr tablet Take 400 mg by mouth 2 (two) times daily.   Yes Historical Provider, MD  vitamin E 1000 UNIT capsule Take 1,000 Units by mouth daily.   Yes Historical Provider, MD   Physical Exam: Filed Vitals:   01/17/14 1136  BP: 101/44  Pulse: 109  Temp:   Resp: 17    BP 101/44  Pulse 109  Temp(Src) 98.2 F (36.8 C) (Oral)  Resp 17  Ht 5\' 2"  (1.575 m)  Wt 87.091 kg (192 lb)  BMI 35.11 kg/m2  SpO2 96%  General:  Alert oriented somewhat ill appearing Eyes: PERRL, normal lids, irises & conjunctiva ENT: Ears are clear nose without drainage oropharynx without erythema or exudate Neck: no LAD, masses or thyromegaly Cardiovascular: Heart sounds are distant tachycardic but regular no murmur no gallop no rub trace lower extremity edema Telemetry: SR, no arrhythmias  Respiratory: Increased work of breathing with conversation. Frequent productive cough. Breath sounds with fair air flow diffuse rhonchi faint expiratory wheeze. Very few and fine crackles particularly in left base Abdomen: soft, nondistended  mild diffuse tenderness to palpation Skin: no rash or induration seen on limited exam Musculoskeletal: grossly normal tone BUE/BLE Psychiatric: grossly normal mood and affect, speech fluent and appropriate Neurologic: grossly non-focal.          Labs on Admission:  Basic Metabolic Panel:  Recent Labs Lab 01/17/14 0859  NA 134*  K 4.1  CL 93*  CO2 30  GLUCOSE 139*  BUN 20  CREATININE 1.03  CALCIUM 9.0   Liver Function Tests: No results found for this basename: AST, ALT, ALKPHOS, BILITOT, PROT, ALBUMIN,  in the last 168 hours No results found for this basename: LIPASE, AMYLASE,  in the last 168 hours No results found for this basename: AMMONIA,  in the last 168 hours CBC:  Recent Labs Lab 01/17/14 0859  WBC 6.5  NEUTROABS 4.3  HGB 10.6*  HCT 33.3*  MCV 80.0  PLT 252   Cardiac Enzymes:  Recent Labs Lab 01/17/14 0859  TROPONINI <0.30    BNP (last 3 results)  Recent Labs  12/18/13 0838 12/20/13 0615 01/17/14 0859  PROBNP 258.8* 1475.0* 1249.0*   CBG: No results found for this basename: GLUCAP,  in the last 168 hours  Radiological Exams on Admission: Dg Chest Port 1 View  01/17/2014   CLINICAL DATA:  Short of breath  EXAM: PORTABLE CHEST - 1 VIEW  COMPARISON:  DG CHEST 1V PORT dated 12/20/2013  FINDINGS: Low volumes. Vascular congestion. No consolidation. No pneumothorax. Basilar hypoaeration.  IMPRESSION: Vascular congestion and basilar hypoaeration.   Electronically Signed   By: Maryclare Bean M.D.   On: 01/17/2014 09:02    EKG: Independently reviewed normal sinus rhythm  Assessment/Plan Principal Problem:   Acute on chronic respiratory failure: Likely multifactorial specifically COPD and diastolic heart failure exacerbations. Will admit to intensive care. Of note patient has required BiPAP on previous admissions. Will continue Venti mask and weaned to baseline as able. She was given one dose of Lasix and Solu-Medrol in the emergency room to 64.32 but quickly  rebounded to 101/44 at time of exam. Her blood pressure dropped while in the emergency department. Will get a PICC line for CVP to monitor volume status and provide Lasix as able. Will continue Solu-Medrol and nebulizers. Will provide Levaquin empirically Active Problems:   COPD with exacerbation: trigger unclear.  Pt with multiple admissions. Has stopped smoking. See therapies #1. Will request pulmonary consult.    Chronic diastolic heart failure with mild exacerbation: see #1. Patient takes Lasix 40 mg by mouth twice a day at home.Last echo in October of 2014 with EF of 50-55% and grade 2 diastolic dysfunction. Monitor intake and output and obtain daily weights. Clinically appears only mildly decompensated. Will hold off on lasix for now due to BP. Monitor closely.     Hypotension: improving at time of my exam. She did get lasix IV in ED. Etiology uncertain. Will hold home anti-hypertensive meds. Monitor closely    Type 2 diabetes mellitus: on metfomin at home. Hold for now. Provide SSI for optimal glycemic control    Atrial fibrillation: in sinus rhythm. Home meds include cardizem which will be held on admission due to hypotension. On Eliquis as well. Will continue    Code Status: full  Family Communication: son at bedside Disposition Plan: home when ready  Time spent: 63 minutes  [facility] Pager [phone]

## 2014-01-17 NOTE — ED Notes (Signed)
Pt bringing up thick yellow secretions now

## 2014-01-18 LAB — COMPREHENSIVE METABOLIC PANEL
ALT: 7 U/L (ref 0–35)
AST: 9 U/L (ref 0–37)
Albumin: 2.8 g/dL — ABNORMAL LOW (ref 3.5–5.2)
Alkaline Phosphatase: 85 U/L (ref 39–117)
BUN: 22 mg/dL (ref 6–23)
CALCIUM: 9.6 mg/dL (ref 8.4–10.5)
CO2: 35 meq/L — AB (ref 19–32)
CREATININE: 0.69 mg/dL (ref 0.50–1.10)
Chloride: 98 mEq/L (ref 96–112)
GFR calc Af Amer: >90 mL/min (ref >90)
GFR calc non Af Amer: >90 mL/min (ref >90)
GLUCOSE: 212 mg/dL — AB (ref 70–99)
Potassium: 4.1 mEq/L (ref 3.7–5.3)
SODIUM: 144 meq/L (ref 137–147)
TOTAL PROTEIN: 7.1 g/dL (ref 6.0–8.3)
Total Bilirubin: <0.2 mg/dL — ABNORMAL LOW (ref 0.3–1.2)

## 2014-01-18 LAB — URINE CULTURE
CULTURE: NO GROWTH
Colony Count: NO GROWTH

## 2014-01-18 LAB — GLUCOSE, CAPILLARY
Glucose-Capillary: 190 mg/dL — ABNORMAL HIGH (ref 70–99)
Glucose-Capillary: 202 mg/dL — ABNORMAL HIGH (ref 70–99)
Glucose-Capillary: 189 mg/dL — ABNORMAL HIGH (ref 70–99)
Glucose-Capillary: 176 mg/dL — ABNORMAL HIGH (ref 70–99)

## 2014-01-18 LAB — CBC
HEMATOCRIT: 35.3 % — AB (ref 36.0–46.0)
HEMOGLOBIN: 11.2 g/dL — AB (ref 12.0–15.0)
MCH: 25.3 pg — AB (ref 26.0–34.0)
MCHC: 31.7 g/dL (ref 30.0–36.0)
MCV: 79.7 fL (ref 78.0–100.0)
Platelets: 275 10*3/uL (ref 150–400)
RBC: 4.43 MIL/uL (ref 3.87–5.11)
RDW: 15.8 % — ABNORMAL HIGH (ref 11.5–15.5)
WBC: 6.5 10*3/uL (ref 4.0–10.5)

## 2014-01-18 MED ORDER — DILTIAZEM HCL ER COATED BEADS 180 MG PO CP24: 180.0000 mg | ORAL_CAPSULE | Freq: Every day | ORAL | Status: DC

## 2014-01-18 MED ORDER — THEOPHYLLINE ER 200 MG PO TB12
400.0000 mg | ORAL_TABLET | Freq: Two times a day (BID) | ORAL | Status: DC
  Administered 2014-01-18 – 2014-01-22 (×9): 400 mg via ORAL
  Filled 2014-01-18 (×10): qty 2

## 2014-01-18 MED ORDER — METHYLPREDNISOLONE SODIUM SUCC 125 MG IJ SOLR
60.0000 mg | Freq: Two times a day (BID) | INTRAMUSCULAR | Status: DC
  Administered 2014-01-18 – 2014-01-19 (×2): 60 mg via INTRAVENOUS
  Filled 2014-01-18 (×2): qty 2

## 2014-01-18 MED ORDER — DILTIAZEM HCL 60 MG PO TABS
60.0000 mg | ORAL_TABLET | Freq: Four times a day (QID) | ORAL | Status: DC
  Administered 2014-01-18 – 2014-01-19 (×4): 60 mg via ORAL
  Filled 2014-01-18 (×4): qty 1

## 2014-01-18 MED ORDER — RAMIPRIL 2.5 MG PO CAPS
5.0000 mg | ORAL_CAPSULE | Freq: Every day | ORAL | Status: DC
  Administered 2014-01-18 – 2014-01-22 (×5): 5 mg via ORAL
  Filled 2014-01-18 (×5): qty 2

## 2014-01-18 NOTE — Progress Notes (Signed)
Consulting cardiologist:Koneswaran, Jamesetta So MD  Subjective:    Still short of breath and coughing a lot, but states she is breathing better than yesterday.   Objective:   Temp:  [97.8 F (36.6 C)-98.3 F (36.8 C)] 97.8 F (36.6 C) (02/26 0800) Pulse Rate:  [83-124] 99 (02/26 0800) Resp:  [15-26] 17 (02/26 0800) BP: (64-151)/(32-82) 151/76 mmHg (02/26 0800) SpO2:  [79 %-98 %] 93 % (02/26 0800) FiO2 (%):  [50 %] 50 % (02/26 0708) Weight:  [200 lb 9.9 oz (91 kg)] 200 lb 9.9 oz (91 kg) (02/26 0500) Last BM Date: 01/17/14  Filed Weights   01/17/14 0824 01/18/14 0500  Weight: 192 lb (87.091 kg) 200 lb 9.9 oz (91 kg)    Intake/Output Summary (Last 24 hours) at 01/18/14 0945 Last data filed at 01/18/14 0727  Gross per 24 hour  Intake    400 ml  Output   3300 ml  Net  -2900 ml    Telemetry: Sinus tachycardia.  Exam:  General: No acute distress. Wearing O2 mask.  HEENT: Conjunctiva and lids normal, oropharynx clear.  Lungs: Inspiratory and expiratory crackles, inspiratory wheezes. Frequent coughing with inspiration.  Cardiac: No elevated JVP or bruits. RRR, no gallop or rub.   Abdomen: Normoactive bowel sounds, nontender, nondistended.  Extremities: No pitting edema, distal pulses full.  Neuropsychiatric: Alert and oriented x3, affect appropriate.   Lab Results:  Basic Metabolic Panel:  Recent Labs Lab 01/17/14 0859 01/18/14 0505  NA 134* 144  K 4.1 4.1  CL 93* 98  CO2 30 35*  GLUCOSE 139* 212*  BUN 20 22  CREATININE 1.03 0.69  CALCIUM 9.0 9.6    Liver Function Tests:  Recent Labs Lab 01/18/14 0505  AST 9  ALT 7  ALKPHOS 85  BILITOT <0.2*  PROT 7.1  ALBUMIN 2.8*    CBC:  Recent Labs Lab 01/17/14 0859 01/18/14 0505  WBC 6.5 6.5  HGB 10.6* 11.2*  HCT 33.3* 35.3*  MCV 80.0 79.7  PLT 252 275    Cardiac Enzymes:  Recent Labs Lab 01/17/14 0859  TROPONINI <0.30    BNP:  Recent Labs  12/18/13 0838 12/20/13 0615  01/17/14 0859  PROBNP 258.8* 1475.0* 1249.0*    Radiology: Dg Chest Port 1 View  01/17/2014   CLINICAL DATA:  Status post PICC line placement  EXAM: PORTABLE CHEST - 1 VIEW  COMPARISON:  DG CHEST 1V PORT dated 01/17/2014  FINDINGS: The lungs are well-expanded. There is no evidence of a pneumothorax or pneumothorax. The PICC line tip lies in the region of the junction of the proximal and midportions of the SVC. The pulmonary interstitial markings remain increased. The cardiac silhouette is normal in size. The pulmonary vascularity is mildly prominent centrally.  IMPRESSION: 1. There is no evidence of postprocedure complication following placement of a right-sided PICC line. 2. There remain increased interstitial markings diffusely within the lungs. This may reflect a low-grade CHF superimposed upon underlying COPD.   Electronically Signed   By: David  Martinique   On: 01/17/2014 15:21   Dg Chest Port 1 View  01/17/2014   CLINICAL DATA:  Short of breath  EXAM: PORTABLE CHEST - 1 VIEW  COMPARISON:  DG CHEST 1V PORT dated 12/20/2013  FINDINGS: Low volumes. Vascular congestion. No consolidation. No pneumothorax. Basilar hypoaeration.  IMPRESSION: Vascular congestion and basilar hypoaeration.   Electronically Signed   By: Maryclare Bean M.D.   On: 01/17/2014 09:02       Medications:  Scheduled Medications: . ALPRAZolam  0.5-1 mg Oral TID  . antiseptic oral rinse  15 mL Mouth Rinse BID  . apixaban  5 mg Oral BID  . budesonide-formoterol  2 puff Inhalation BID  . citalopram  10 mg Oral Daily  . furosemide  40 mg Oral Daily  . gabapentin  100 mg Oral TID  . guaiFENesin  1,200 mg Oral BID  . influenza vac split quadrivalent PF  0.5 mL Intramuscular Tomorrow-1000  . insulin aspart  0-15 Units Subcutaneous TID WC  . insulin aspart  0-5 Units Subcutaneous QHS  . ipratropium  0.5 mg Nebulization Q4H  . ipratropium  1 mg Nebulization Once  . levalbuterol  0.63 mg Nebulization Q4H  . levofloxacin (LEVAQUIN) IV   500 mg Intravenous Q24H  . levothyroxine  112 mcg Oral QAC breakfast  . methylPREDNISolone (SOLU-MEDROL) injection  60 mg Intravenous Q6H  . pantoprazole  40 mg Oral Daily  . simvastatin  40 mg Oral q1800  . sodium chloride  10-40 mL Intracatheter Q12H  . sodium chloride  3 mL Intravenous Q12H        PRN Medications:  sodium chloride, acetaminophen, acetaminophen, HYDROcodone-homatropine, levalbuterol, ondansetron (ZOFRAN) IV, ondansetron, oxyCODONE-acetaminophen, sodium chloride, sodium chloride   Assessment and Plan:   1. Acute on Chronic Respiratory Failure: She is breathing better but is still very congested. She has diuresed 3 liters from IV lasix. She continues on abx and steroid breathing treatments.   2. Acute on Chronic Diastolic CHF:  Will continue IV diureses, and follow up with BMET. Creatinine is 0.69 this am. She is diuresing well and would benefit from one more day of IV lasix.   3. Atrial fib: Heart rate is elevated today. Likely related to acute illness and steroids. She remains on Eliquis. Will restart low dose dilitazem in Q 6 hr doses to evaluate HR and BP response.She is on 240 mg daily at home. Will start with 60 mg.    Phill Myron. Purcell Nails NP [facility] 01/18/2014, 9:45 AM

## 2014-01-18 NOTE — Progress Notes (Signed)
[facility] PROGRESS NOTE  Joanna Perez VVO:[id number] DOB: October 23, 1956 DOA: 01/17/2014 PCP: Glo Herring., MD  Assessment/Plan: 1. Acute on chronic respiratory failure, multifactorial due to COPD and diastolic congestive heart failure. Patient is continued on Ventimask. We'll try to wean down the nasal cannula today. 2. Acute on chronic diastolic congestive heart failure. Volume status is -2.9 L. She's currently on oral Lasix. Appreciate cardiology assistance. Her volume status appears to be improving. 3. COPD exacerbation. She's on steroids, nebulizer treatments as well as antibiotics. Her baseline functional status is very poor. She is chronically on 4 L of oxygen. We'll to wean her down from Ventimask to nasal cannula today. 4. Atrial fibrillation, paroxysmal. Currently in sinus rhythm. Will restart Cardizem since blood pressure is improved. She is on apixaban for anticoagulation 5. Diabetes. Currently on sliding still insulin, metformin on hold. 6. Hypothyroidism. Continue Synthroid.  Code Status: full code Family Communication: discharge home once improved Disposition Plan: discharge home once improved   Consultants:  cardiology  Procedures:    Antibiotics:  levaquin 2/25>>  HPI/Subjective: Feels breathing is mildly improving, still short of breath, does not have much of a cough. Feels suffocated in ventimask, wants to try and take it off today. Complaining of back pain.  Objective: Filed Vitals:   01/18/14 0800  BP: 151/76  Pulse: 99  Temp: 97.8 F (36.6 C)  Resp: 17    Intake/Output Summary (Last 24 hours) at 01/18/14 0943 Last data filed at 01/18/14 2694  Gross per 24 hour  Intake    400 ml  Output   3300 ml  Net  -2900 ml   Filed Weights   01/17/14 0824 01/18/14 0500  Weight: 87.091 kg (192 lb) 91 kg (200 lb 9.9 oz)    Exam:   General:  Laying in bed, resting, no acute distress  Cardiovascular: s1, s2, rrr  Respiratory: coarse breath  sounds bilaterally, mild exp wheeze  Abdomen: soft, nt, nd, bs+  Musculoskeletal: trace pitting edema improving.   Data Reviewed: Basic Metabolic Panel:  Recent Labs Lab 01/17/14 0859 01/18/14 0505  NA 134* 144  K 4.1 4.1  CL 93* 98  CO2 30 35*  GLUCOSE 139* 212*  BUN 20 22  CREATININE 1.03 0.69  CALCIUM 9.0 9.6   Liver Function Tests:  Recent Labs Lab 01/18/14 0505  AST 9  ALT 7  ALKPHOS 85  BILITOT <0.2*  PROT 7.1  ALBUMIN 2.8*   No results found for this basename: LIPASE, AMYLASE,  in the last 168 hours No results found for this basename: AMMONIA,  in the last 168 hours CBC:  Recent Labs Lab 01/17/14 0859 01/18/14 0505  WBC 6.5 6.5  NEUTROABS 4.3  --   HGB 10.6* 11.2*  HCT 33.3* 35.3*  MCV 80.0 79.7  PLT 252 275   Cardiac Enzymes:  Recent Labs Lab 01/17/14 0859  TROPONINI <0.30   BNP (last 3 results)  Recent Labs  12/18/13 0838 12/20/13 0615 01/17/14 0859  PROBNP 258.8* 1475.0* 1249.0*   CBG:  Recent Labs Lab 01/17/14 1701 01/17/14 2115 01/18/14 0730  GLUCAP 251* 185* 190*    Recent Results (from the past 240 hour(s))  URINE CULTURE     Status: None   Collection Time    01/17/14  9:01 AM      Result Value Ref Range Status   Specimen Description URINE, CATHETERIZED   Final   Special Requests NONE   Final   Culture  Setup Time  Final   Value: 01/17/2014 10:30     Performed at [place]     Final   Value: NO GROWTH     Performed at [facility]   Culture     Final   Value: NO GROWTH     Performed at [facility]   Report Status 01/18/2014 FINAL   Final     Studies: Dg Chest Port 1 View  01/17/2014   CLINICAL DATA:  Status post PICC line placement  EXAM: PORTABLE CHEST - 1 VIEW  COMPARISON:  DG CHEST 1V PORT dated 01/17/2014  FINDINGS: The lungs are well-expanded. There is no evidence of a pneumothorax or pneumothorax. The PICC line tip lies in the region of the junction of the  proximal and midportions of the SVC. The pulmonary interstitial markings remain increased. The cardiac silhouette is normal in size. The pulmonary vascularity is mildly prominent centrally.  IMPRESSION: 1. There is no evidence of postprocedure complication following placement of a right-sided PICC line. 2. There remain increased interstitial markings diffusely within the lungs. This may reflect a low-grade CHF superimposed upon underlying COPD.   Electronically Signed   By: David  Martinique   On: 01/17/2014 15:21   Dg Chest Port 1 View  01/17/2014   CLINICAL DATA:  Short of breath  EXAM: PORTABLE CHEST - 1 VIEW  COMPARISON:  DG CHEST 1V PORT dated 12/20/2013  FINDINGS: Low volumes. Vascular congestion. No consolidation. No pneumothorax. Basilar hypoaeration.  IMPRESSION: Vascular congestion and basilar hypoaeration.   Electronically Signed   By: Maryclare Bean M.D.   On: 01/17/2014 09:02    Scheduled Meds: . ALPRAZolam  0.5-1 mg Oral TID  . antiseptic oral rinse  15 mL Mouth Rinse BID  . apixaban  5 mg Oral BID  . budesonide-formoterol  2 puff Inhalation BID  . citalopram  10 mg Oral Daily  . furosemide  40 mg Oral Daily  . gabapentin  100 mg Oral TID  . guaiFENesin  1,200 mg Oral BID  . influenza vac split quadrivalent PF  0.5 mL Intramuscular Tomorrow-1000  . insulin aspart  0-15 Units Subcutaneous TID WC  . insulin aspart  0-5 Units Subcutaneous QHS  . ipratropium  0.5 mg Nebulization Q4H  . ipratropium  1 mg Nebulization Once  . levalbuterol  0.63 mg Nebulization Q4H  . levofloxacin (LEVAQUIN) IV  500 mg Intravenous Q24H  . levothyroxine  112 mcg Oral QAC breakfast  . methylPREDNISolone (SOLU-MEDROL) injection  60 mg Intravenous Q6H  . pantoprazole  40 mg Oral Daily  . simvastatin  40 mg Oral q1800  . sodium chloride  10-40 mL Intracatheter Q12H  . sodium chloride  3 mL Intravenous Q12H   Continuous Infusions:   Principal Problem:   Acute on chronic respiratory failure Active Problems:    Type 2 diabetes mellitus   Atrial fibrillation   Chronic diastolic heart failure   COPD with exacerbation   Dyspnea   Hypotension    Time spent: 51mins    MEMON,JEHANZEB  [facility] Pager [phone]. If 7PM-7AM, please contact night-coverage at [email or web address], password [facility] 01/18/2014, 9:43 AM  LOS: 1 day

## 2014-01-18 NOTE — Progress Notes (Signed)
Inpatient Diabetes Program Recommendations  AACE/ADA: New Consensus Statement on Inpatient Glycemic Control (2013)  Target Ranges:  Prepandial:   less than 140 mg/dL      Peak postprandial:   less than 180 mg/dL (1-2 hours)      Critically ill patients:  140 - 180 mg/dL   Results for TOLA, MEAS (MRN [id number]) as of 01/18/2014 08:25  Ref. Range 01/17/2014 17:01 01/17/2014 21:15 01/18/2014 07:30  Glucose-Capillary Latest Range: 70-99 mg/dL 251 (H) 185 (H) 190 (H)   Diabetes history: DM2 Outpatient Diabetes medications: Metformin 500 mg BID Current orders for Inpatient glycemic control: Novolog 0-15 units AC, Novolog 0-5 units HS  Inpatient Diabetes Program Recommendations Correction (SSI): Please consider increasing Novolog correction to resistant scale. Insulin - Meal Coverage: Please consider ordering Novolog 5 units TID with meals.  Thanks, Barnie Alderman, RN, MSN, CCRN Diabetes Coordinator Inpatient Diabetes Program [phone] (Team Pager) [phone] ([facility] office) [phone] [facility] office)

## 2014-01-18 NOTE — Progress Notes (Signed)
The patient was seen and examined, and I agree with the assessment and plan as documented above, with modifications as noted below. She feels her breathing has improved since yesterday. Only complaint is back pain which is chronic, and takes oral narcotics for this.  1. Acute hypoxic respiratory failure: again, this appears to be due to a combination of both COPD exacerbation and a very mild degree of diastolic heart failure, which has improved. She is normotensive to hypertensive. I will resume her home dosing of 40 mg bid. Continue breathing treatments with Atrovent, Xopenex, and Solu-Medrol along with levofloxacin.  She reportedly sees a pulmonologist in [place]. I would consider a pulmonology consultation during this hospitalization, with the hope of closer and more frequent outpatient follow-up.  2. Paroxysmal atrial fibrillation: currently in sinus rhythm/sinus tachycardia, likely related to beta agonist treatments. Restarting diltiazem so as to avoid further diastolic decompensation.  3. Acute on chronic diastolic heart failure: as per #1. Heart failure being driven by significant pulmonary disease and COPD exacerbation. Known grade II diastolic dysfunction.  4. HTN: As BP has normalized, I will restart ramipril but at 5 mg daily (home dose is 10 mg daily). 5. Hyperlipidemia: continue simvastatin.

## 2014-01-19 ENCOUNTER — Inpatient Hospital Stay (HOSPITAL_COMMUNITY): Payer: Medicaid Other

## 2014-01-19 DIAGNOSIS — J441 Chronic obstructive pulmonary disease with (acute) exacerbation: Secondary | ICD-10-CM

## 2014-01-19 DIAGNOSIS — I509 Heart failure, unspecified: Secondary | ICD-10-CM

## 2014-01-19 DIAGNOSIS — I5031 Acute diastolic (congestive) heart failure: Secondary | ICD-10-CM

## 2014-01-19 DIAGNOSIS — J9620 Acute and chronic respiratory failure, unspecified whether with hypoxia or hypercapnia: Secondary | ICD-10-CM

## 2014-01-19 DIAGNOSIS — D72829 Elevated white blood cell count, unspecified: Secondary | ICD-10-CM

## 2014-01-19 DIAGNOSIS — J189 Pneumonia, unspecified organism: Secondary | ICD-10-CM

## 2014-01-19 DIAGNOSIS — E119 Type 2 diabetes mellitus without complications: Secondary | ICD-10-CM

## 2014-01-19 DIAGNOSIS — M549 Dorsalgia, unspecified: Secondary | ICD-10-CM

## 2014-01-19 LAB — GLUCOSE, CAPILLARY
GLUCOSE-CAPILLARY: 129 mg/dL — AB (ref 70–99)
Glucose-Capillary: 194 mg/dL — ABNORMAL HIGH (ref 70–99)
Glucose-Capillary: 224 mg/dL — ABNORMAL HIGH (ref 70–99)
Glucose-Capillary: 188 mg/dL — ABNORMAL HIGH (ref 70–99)

## 2014-01-19 LAB — CBC
HEMATOCRIT: 37.4 % (ref 36.0–46.0)
Hemoglobin: 11.7 g/dL — ABNORMAL LOW (ref 12.0–15.0)
MCH: 25.3 pg — ABNORMAL LOW (ref 26.0–34.0)
MCHC: 31.3 g/dL (ref 30.0–36.0)
MCV: 81 fL (ref 78.0–100.0)
Platelets: 325 10*3/uL (ref 150–400)
RBC: 4.62 MIL/uL (ref 3.87–5.11)
RDW: 16.1 % — ABNORMAL HIGH (ref 11.5–15.5)
WBC: 11.1 10*3/uL — AB (ref 4.0–10.5)

## 2014-01-19 LAB — BASIC METABOLIC PANEL
BUN: 24 mg/dL — ABNORMAL HIGH (ref 6–23)
CO2: 38 mEq/L — ABNORMAL HIGH (ref 19–32)
Calcium: 10.1 mg/dL (ref 8.4–10.5)
Chloride: 96 mEq/L (ref 96–112)
Creatinine, Ser: 0.77 mg/dL (ref 0.50–1.10)
GFR calc Af Amer: >90 mL/min (ref >90)
GFR calc non Af Amer: >90 mL/min (ref >90)
GLUCOSE: 179 mg/dL — AB (ref 70–99)
Potassium: 4 mEq/L (ref 3.7–5.3)
Sodium: 144 mEq/L (ref 137–147)

## 2014-01-19 LAB — THEOPHYLLINE LEVEL: Theophylline Lvl: 5.8 ug/mL — ABNORMAL LOW (ref 10.0–20.0)

## 2014-01-19 MED ORDER — DILTIAZEM HCL ER COATED BEADS 240 MG PO CP24
240.0000 mg | ORAL_CAPSULE | Freq: Every day | ORAL | Status: DC
  Administered 2014-01-19 – 2014-01-22 (×4): 240 mg via ORAL
  Filled 2014-01-19 (×4): qty 1

## 2014-01-19 MED ORDER — MILK AND MOLASSES ENEMA
1.0000 | Freq: Once | RECTAL | Status: AC
  Administered 2014-01-19: 250 mL via RECTAL

## 2014-01-19 MED ORDER — LEVOFLOXACIN 500 MG PO TABS
500.0000 mg | ORAL_TABLET | Freq: Every day | ORAL | Status: DC
  Administered 2014-01-19 – 2014-01-20 (×2): 500 mg via ORAL
  Filled 2014-01-19 (×2): qty 1

## 2014-01-19 MED ORDER — BISACODYL 5 MG PO TBEC
5.0000 mg | DELAYED_RELEASE_TABLET | Freq: Every day | ORAL | Status: DC | PRN
  Administered 2014-01-19: 5 mg via ORAL
  Filled 2014-01-19: qty 1

## 2014-01-19 MED ORDER — METHYLPREDNISOLONE SODIUM SUCC 40 MG IJ SOLR
40.0000 mg | Freq: Two times a day (BID) | INTRAMUSCULAR | Status: DC
  Administered 2014-01-19 – 2014-01-20 (×2): 40 mg via INTRAVENOUS
  Filled 2014-01-19 (×2): qty 1

## 2014-01-19 MED ORDER — FLEET ENEMA 7-19 GM/118ML RE ENEM: 1.0000 | ENEMA | Freq: Every day | RECTAL | Status: DC | PRN

## 2014-01-19 MED ORDER — INSULIN ASPART 100 UNIT/ML ~~LOC~~ SOLN
5.0000 [IU] | Freq: Three times a day (TID) | SUBCUTANEOUS | Status: DC
  Administered 2014-01-19 – 2014-01-22 (×9): 5 [IU] via SUBCUTANEOUS

## 2014-01-19 NOTE — Progress Notes (Signed)
PT TRANSFERRING TO ROOM 313.TRANSFER REPORT CALLED TO JESSICA BUCKNER RN ON 300. PT ALERT AND ORIENTED, SKIN WARM AND DRY. O2 SAT 92 % ON O2 AT 5L/MIN VIA [place]. MILD RHONCHI THROUGHOUT ALL LOBES.NO RESULTS FROM MILK AND MOLASSUS ENEMA.

## 2014-01-19 NOTE — Progress Notes (Signed)
[facility] PROGRESS NOTE  Joanna Perez SWF:[id number] DOB: Apr 20, 1956 DOA: 01/17/2014 PCP: Glo Herring., MD  Assessment/Plan: 1. Acute on chronic respiratory failure, multifactorial due to COPD and diastolic congestive heart failure. Improving slowly.  Patient is on 4L [place] which is her home dose. Continues with mild increased work of breathing. Improved air flow. Sats 95% on 4L. Will continue steroids but taper, nebs. Transfer to tele.  2. Acute on chronic diastolic congestive heart failure. Mild.  Volume status is -6.7 L. Weight 90.1kg.  She's currently on oral Lasix. Appreciate cardiology assistance.  3. COPD exacerbation. continue steroids with taper, nebulizer treatments and antibiotics. Her baseline functional status is very poor. She is chronically on 4 L of oxygen. Not quite at baseline but improving.  4. Atrial fibrillation, paroxysmal. Remains in sinus rhythm. Continue  Cardizem. She is on apixaban for anticoagulation 5. Diabetes. Fair control. CBG range 176-194.  Currently on sliding scale insulin. Will change level to resistant and add meal coverage as her appetite is excellent.  metformin on hold. 6. Hypothyroidism. Continue Synthroid.     Code Status: full Family Communication: none present Disposition Plan: home when improved   Consultants:  cardiology  Procedures:  none  Antibiotics:  levaquin 01/17/14>>>  HPI/Subjective: Sitting on side of bed. Complains of back pain and constipation. Reports breathing better than when admitted but not at baseline  Objective: Filed Vitals:   01/19/14 0700  BP: 147/66  Pulse: 91  Temp:   Resp: 19    Intake/Output Summary (Last 24 hours) at 01/19/14 0841 Last data filed at 01/19/14 0700  Gross per 24 hour  Intake    480 ml  Output   4350 ml  Net  -3870 ml   Filed Weights   01/17/14 0824 01/18/14 0500 01/19/14 0500  Weight: 87.091 kg (192 lb) 91 kg (200 lb 9.9 oz) 90.1 kg (198 lb 10.2 oz)     Exam:   General:  Obese, NAD  Cardiovascular: RRR No MGR trace pitting edema  Respiratory: mild increased work of breathing with conversation. Improved air flow with coarse BS and faint expiratory wheeze  Abdomen: obese soft +BS non-tender to palpation  Musculoskeletal: no clubbing or cyanosis   Data Reviewed: Basic Metabolic Panel:  Recent Labs Lab 01/17/14 0859 01/18/14 0505 01/19/14 0404  NA 134* 144 144  K 4.1 4.1 4.0  CL 93* 98 96  CO2 30 35* 38*  GLUCOSE 139* 212* 179*  BUN 20 22 24*  CREATININE 1.03 0.69 0.77  CALCIUM 9.0 9.6 10.1   Liver Function Tests:  Recent Labs Lab 01/18/14 0505  AST 9  ALT 7  ALKPHOS 85  BILITOT <0.2*  PROT 7.1  ALBUMIN 2.8*   No results found for this basename: LIPASE, AMYLASE,  in the last 168 hours No results found for this basename: AMMONIA,  in the last 168 hours CBC:  Recent Labs Lab 01/17/14 0859 01/18/14 0505 01/19/14 0404  WBC 6.5 6.5 11.1*  NEUTROABS 4.3  --   --   HGB 10.6* 11.2* 11.7*  HCT 33.3* 35.3* 37.4  MCV 80.0 79.7 81.0  PLT 252 275 325   Cardiac Enzymes:  Recent Labs Lab 01/17/14 0859  TROPONINI <0.30   BNP (last 3 results)  Recent Labs  12/18/13 0838 12/20/13 0615 01/17/14 0859  PROBNP 258.8* 1475.0* 1249.0*   CBG:  Recent Labs Lab 01/18/14 0730 01/18/14 1109 01/18/14 1634 01/18/14 2106 01/19/14 0719  GLUCAP 190* 202* 189* 176* 194*    Recent  Results (from the past 240 hour(s))  URINE CULTURE     Status: None   Collection Time    01/17/14  9:01 AM      Result Value Ref Range Status   Specimen Description URINE, CATHETERIZED   Final   Special Requests NONE   Final   Culture  Setup Time     Final   Value: 01/17/2014 10:30     Performed at [place]     Final   Value: NO GROWTH     Performed at [facility]   Culture     Final   Value: NO GROWTH     Performed at [facility]   Report Status 01/18/2014 FINAL   Final      Studies: Dg Chest Port 1 View  01/19/2014   CLINICAL DATA:  Shortness of breath  EXAM: PORTABLE CHEST - 1 VIEW  COMPARISON:  DG CHEST 1V PORT dated 01/17/2014; DG CHEST 1V PORT dated 12/20/2013; DG CHEST 1V PORT dated 12/18/2013  FINDINGS: Examination is again degraded due to patient body habitus and portable technique.  Grossly unchanged enlarged cardiac silhouette and mediastinal contours with atherosclerotic plaque within the thoracic aorta. Interval repositioning of right upper extremity approach PICC line with tip now projected over the superior cavoatrial junction grossly unchanged diffuse nodular thickening of the pulmonary interstitium with worsening heterogeneous airspace opacities within the right upper lung. No definite pleural effusion or pneumothorax. Grossly unchanged bones.  IMPRESSION: 1. Findings suggestive of a worsening right upper lobe pneumonia (including atypical etiologies) superimposed on airways disease. 2. Repositioning of right upper extremity approach PICC line with tip now projected over the superior cavoatrial junction.   Electronically Signed   By: Sandi Mariscal M.D.   On: 01/19/2014 07:42   Dg Chest Port 1 View  01/17/2014   CLINICAL DATA:  Status post PICC line placement  EXAM: PORTABLE CHEST - 1 VIEW  COMPARISON:  DG CHEST 1V PORT dated 01/17/2014  FINDINGS: The lungs are well-expanded. There is no evidence of a pneumothorax or pneumothorax. The PICC line tip lies in the region of the junction of the proximal and midportions of the SVC. The pulmonary interstitial markings remain increased. The cardiac silhouette is normal in size. The pulmonary vascularity is mildly prominent centrally.  IMPRESSION: 1. There is no evidence of postprocedure complication following placement of a right-sided PICC line. 2. There remain increased interstitial markings diffusely within the lungs. This may reflect a low-grade CHF superimposed upon underlying COPD.   Electronically Signed   By: David   [place]   On: 01/17/2014 15:21   Dg Chest Port 1 View  01/17/2014   CLINICAL DATA:  Short of breath  EXAM: PORTABLE CHEST - 1 VIEW  COMPARISON:  DG CHEST 1V PORT dated 12/20/2013  FINDINGS: Low volumes. Vascular congestion. No consolidation. No pneumothorax. Basilar hypoaeration.  IMPRESSION: Vascular congestion and basilar hypoaeration.   Electronically Signed   By: Maryclare Bean M.D.   On: 01/17/2014 09:02    Scheduled Meds: . ALPRAZolam  0.5-1 mg Oral TID  . antiseptic oral rinse  15 mL Mouth Rinse BID  . apixaban  5 mg Oral BID  . budesonide-formoterol  2 puff Inhalation BID  . citalopram  10 mg Oral Daily  . diltiazem  60 mg Oral 4 times per day  . furosemide  40 mg Oral Daily  . gabapentin  100 mg Oral TID  . guaiFENesin  1,200 mg  Oral BID  . insulin aspart  0-15 Units Subcutaneous TID WC  . insulin aspart  0-5 Units Subcutaneous QHS  . ipratropium  0.5 mg Nebulization Q4H  . levalbuterol  0.63 mg Nebulization Q4H  . levofloxacin (LEVAQUIN) IV  500 mg Intravenous Q24H  . levothyroxine  112 mcg Oral QAC breakfast  . methylPREDNISolone (SOLU-MEDROL) injection  40 mg Intravenous Q12H  . pantoprazole  40 mg Oral Daily  . ramipril  5 mg Oral Daily  . simvastatin  40 mg Oral q1800  . sodium chloride  10-40 mL Intracatheter Q12H  . sodium chloride  3 mL Intravenous Q12H  . theophylline  400 mg Oral Q12H   Continuous Infusions:   Principal Problem:   Acute on chronic respiratory failure Active Problems:   Type 2 diabetes mellitus   Atrial fibrillation   Chronic diastolic heart failure   COPD with exacerbation   Dyspnea   Hypotension    Time spent: 30 minutes    [facility] Pager [phone]. If 7PM-7AM, please contact night-coverage at [email or web address], password [facility] 01/19/2014, 8:41 AM  LOS: 2 days

## 2014-01-19 NOTE — Care Management Note (Addendum)
    Page 1 of 2   01/22/2014     11:20:55 AM   CARE MANAGEMENT NOTE 01/22/2014  Patient:  Joanna Perez, Joanna Perez   Account Number:  [id number]  Date Initiated:  01/19/2014  Documentation initiated by:  Theophilus Kinds  Subjective/Objective Assessment:   Pt admitted from home with COPD. Pt lives with her husband and will return home at discharge. Pt is active with [facility] RN. Has home O2 with [facility], nad rolling walker.     Action/Plan:   CM will arrange resumption of [facility] at discharge (per pts choice). No other CM needs noted.   Anticipated DC Date:  01/21/2014   Anticipated DC Plan:  [place]  CM consult      [facility] Choice  Resumption Of Svcs/PTA Provider   Choice offered to / List presented to:  C-1 Patient        [place] arranged  HH-1 RN      [place].   Status of service:  Completed, signed off Medicare Important Message given?   (If response is "NO", the following Medicare IM given date fields will be blank) Date Medicare IM given:   Date Additional Medicare IM given:    Discharge Disposition:  [place]  Per UR Regulation:    If discussed at Long Length of Stay Meetings, dates discussed:    Comments:  01/22/14 [place], RN BSN CM Pt discharged home today with resumption of [facility] RN (per pts choice). Romualdo Bolk of [facility] is aware and will collect the pts information from the chart. [place] services to start within 48 hours of discharge. No DME needs noted. Pt and pts nurse aware of discharge arrangements.  01/19/14 [place], RN BSN CM

## 2014-01-19 NOTE — Progress Notes (Signed)
Patient seen and examined.  Above note reviewed.  She has been admitted for shortness of breath, acute on chronic respiratory failure due to COPD exacerbation and acute on chronic diastolic CHF.  Diuresis has been excellent with oral lasix.  Would continue current dosing.  She is negative 6L since admission.  COPD appears to be improving. She is on solumedrol, antibiotics and nebs.  Will add flutter valve.  Oxygen has been weaned down to 4L which is her home dosing.  Likely transfer to telemetry later today if stable.  Physical therapy evaluation.  Joanna Perez

## 2014-01-19 NOTE — Progress Notes (Signed)
Consulting cardiologist:Koneswaran  Subjective:    Pt is feeling better. Less SOB.   Objective:   Temp:  [97.5 F (36.4 C)-98.2 F (36.8 C)] 97.5 F (36.4 C) (02/27 0730) Pulse Rate:  [72-106] 89 (02/27 0900) Resp:  [16-29] 20 (02/27 0900) BP: (91-150)/(57-102) 143/77 mmHg (02/27 0900) SpO2:  [81 %-95 %] 87 % (02/27 0900) FiO2 (%):  [50 %] 50 % (02/26 1112) Weight:  [198 lb 10.2 oz (90.1 kg)] 198 lb 10.2 oz (90.1 kg) (02/27 0500) Last BM Date: 01/17/14  Filed Weights   01/17/14 0824 01/18/14 0500 01/19/14 0500  Weight: 192 lb (87.091 kg) 200 lb 9.9 oz (91 kg) 198 lb 10.2 oz (90.1 kg)    Intake/Output Summary (Last 24 hours) at 01/19/14 1027 Last data filed at 01/19/14 0944  Gross per 24 hour  Intake    720 ml  Output   4750 ml  Net  -4030 ml    Telemetry: NSR, episodes of tachycardia   Exam:  General: No acute distress.  HEENT: Conjunctiva and lids normal, oropharynx clear.  Lungs:Inspiratory/Expiratory wheezes with crackles int the bases. Back on O2 via [place] at 4/l as she is at home.  Cardiac: No elevated JVP or bruits. RRR, no gallop or rub.   Abdomen: Normoactive bowel sounds, nontender, nondistended.  Extremities: No pitting edema, distal pulses full.  Neuropsychiatric: Alert and oriented x3, affect appropriate.   Lab Results:  Basic Metabolic Panel:  Recent Labs Lab 01/17/14 0859 01/18/14 0505 01/19/14 0404  NA 134* 144 144  K 4.1 4.1 4.0  CL 93* 98 96  CO2 30 35* 38*  GLUCOSE 139* 212* 179*  BUN 20 22 24*  CREATININE 1.03 0.69 0.77  CALCIUM 9.0 9.6 10.1    Liver Function Tests:  Recent Labs Lab 01/18/14 0505  AST 9  ALT 7  ALKPHOS 85  BILITOT <0.2*  PROT 7.1  ALBUMIN 2.8*    CBC:  Recent Labs Lab 01/17/14 0859 01/18/14 0505 01/19/14 0404  WBC 6.5 6.5 11.1*  HGB 10.6* 11.2* 11.7*  HCT 33.3* 35.3* 37.4  MCV 80.0 79.7 81.0  PLT 252 275 325    Cardiac Enzymes:  Recent Labs Lab 01/17/14 0859  TROPONINI <0.30      BNP:  Recent Labs  12/18/13 0838 12/20/13 0615 01/17/14 0859  PROBNP 258.8* 1475.0* 1249.0*    Radiology: Dg Chest Port 1 View  01/19/2014   CLINICAL DATA:  Shortness of breath  EXAM: PORTABLE CHEST - 1 VIEW  COMPARISON:  DG CHEST 1V PORT dated 01/17/2014; DG CHEST 1V PORT dated 12/20/2013; DG CHEST 1V PORT dated 12/18/2013  FINDINGS: Examination is again degraded due to patient body habitus and portable technique.  Grossly unchanged enlarged cardiac silhouette and mediastinal contours with atherosclerotic plaque within the thoracic aorta. Interval repositioning of right upper extremity approach PICC line with tip now projected over the superior cavoatrial junction grossly unchanged diffuse nodular thickening of the pulmonary interstitium with worsening heterogeneous airspace opacities within the right upper lung. No definite pleural effusion or pneumothorax. Grossly unchanged bones.  IMPRESSION: 1. Findings suggestive of a worsening right upper lobe pneumonia (including atypical etiologies) superimposed on airways disease. 2. Repositioning of right upper extremity approach PICC line with tip now projected over the superior cavoatrial junction.   Electronically Signed   By: Sandi Mariscal M.D.   On: 01/19/2014 07:42   Dg Chest Port 1 View  01/17/2014   CLINICAL DATA:  Status post PICC line placement  EXAM:  PORTABLE CHEST - 1 VIEW  COMPARISON:  DG CHEST 1V PORT dated 01/17/2014  FINDINGS: The lungs are well-expanded. There is no evidence of a pneumothorax or pneumothorax. The PICC line tip lies in the region of the junction of the proximal and midportions of the SVC. The pulmonary interstitial markings remain increased. The cardiac silhouette is normal in size. The pulmonary vascularity is mildly prominent centrally.  IMPRESSION: 1. There is no evidence of postprocedure complication following placement of a right-sided PICC line. 2. There remain increased interstitial markings diffusely within the  lungs. This may reflect a low-grade CHF superimposed upon underlying COPD.   Electronically Signed   By: David  Martin[place]   On: 01/17/2014 15:21      Medications:   Scheduled Medications: . ALPRAZolam  0.5-1 mg Oral TID  . antiseptic oral rinse  15 mL Mouth Rinse BID  . apixaban  5 mg Oral BID  . budesonide-formoterol  2 puff Inhalation BID  . citalopram  10 mg Oral Daily  . diltiazem  60 mg Oral 4 times per day  . furosemide  40 mg Oral Daily  . gabapentin  100 mg Oral TID  . guaiFENesin  1,200 mg Oral BID  . insulin aspart  0-15 Units Subcutaneous TID WC  . insulin aspart  0-5 Units Subcutaneous QHS  . insulin aspart  5 Units Subcutaneous TID WC  . ipratropium  0.5 mg Nebulization Q4H  . levalbuterol  0.63 mg Nebulization Q4H  . levofloxacin  500 mg Oral Daily  . levothyroxine  112 mcg Oral QAC breakfast  . methylPREDNISolone (SOLU-MEDROL) injection  40 mg Intravenous Q12H  . pantoprazole  40 mg Oral Daily  . ramipril  5 mg Oral Daily  . simvastatin  40 mg Oral q1800  . sodium chloride  10-40 mL Intracatheter Q12H  . sodium chloride  3 mL Intravenous Q12H  . theophylline  400 mg Oral Q12H   PRN Medications: sodium chloride, acetaminophen, acetaminophen, bisacodyl, HYDROcodone-homatropine, levalbuterol, ondansetron (ZOFRAN) IV, ondansetron, oxyCODONE-acetaminophen, sodium chloride, sodium chloride   Assessment and Plan:   1.Acute on Chronic Diastolic CHF: She has excellent diureses with 4,700 cc of urine output overnight. .Now on 40 mg of lasix daily. Appears well compensated from CHF standpoint. Continue ACE.  2. Atrial fib: Remains in NSR at present. On graduated doses of dilitazem.  HR is well controlled and BP is stable. Will transition to long acting in anticipation of discharge. She will continue on Apixaban 5 mg BID. She has follow up appt previously scheduled in March.  3. Acute on Chronic Respiratory Failure: Improved. Moving out to telemetry for increased ambulation  and possible D/C over weekend if stable.  Phill Myron. Purcell Nails NP [facility] 01/19/2014, 10:27 AM  The patient was seen and examined, and I agree with the assessment and plan as documented above, with modifications as noted below.  She feels her breathing has improved since yesterday. Only complaint is back pain which is chronic, and takes oral narcotics for this.  CXR shows worsening RUL pneumonia, no CHF. WBC elevated today, previously normal.  1. Acute hypoxic respiratory failure: again, this appears to be due to a combination of both COPD exacerbation, pneumonia, and a very mild degree of diastolic heart failure, which has resolved. She is hypertensive. Continue Lasix 40 mg bid. Continue breathing treatments with Atrovent, Xopenex, and Solu-Medrol along with levofloxacin. She has radiographic evidence of worsening RUL pneumonia with leukocytosis.  She reportedly sees a pulmonologist in [place].  I would consider a pulmonology consultation during this hospitalization, with the hope of closer and more frequent outpatient follow-up.  2. Paroxysmal atrial fibrillation: currently in sinus rhythm/sinus tachycardia, likely related to beta agonist treatments. Continue diltiazem so as to avoid further diastolic decompensation.  3. Acute on chronic diastolic heart failure: as per #1. Heart failure being driven by significant pulmonary disease and COPD exacerbation. Known grade II diastolic dysfunction. This has now resolved. Aim to keep BP controlled. 4. HTN: Continue ramipril 5 mg daily (home dose is 10 mg daily), but increase if she remains hypertensive. Would monitor BP carefully as RUL pneumonia appears to have worsened. 5. Hyperlipidemia: continue simvastatin.   No further recommendations. Will sign off.

## 2014-01-19 NOTE — Evaluation (Signed)
Physical Therapy Evaluation Patient Details Name: Joanna Perez MRN: [id number] DOB: Mar 30, 1956 Today's Date: 01/19/2014 Time: 1125-1150 PT Time Calculation (min): 25 min  PT Assessment / Plan / Recommendation History of Present Illness  Joanna Perez is a 58 y.o. female with past medical history that includes chronic respiratory failure on 4 L of oxygen at home, COPD, diastolic heart failure, diabetes, A. fib since emergency department with chief complaint of shortness of breath. She reports a four-day history of worsening shortness of breath. Of note patient hospitalized 2 times in January for same  Clinical Impression  Pt appears at prior functional level was able to ambulate for 240 ft with RW no assist needed from therapist except to roll IV    PT Assessment  Patent does not need any further PT services    Follow Up Recommendations  No PT follow up (limitation is all respiratory)    Does the patient have the potential to tolerate intense rehabilitation    NA  Barriers to Discharge  none      Equipment Recommendations  None recommended by PT    Recommendations for Other Services   none     Precautions / Restrictions Precautions Precautions: None Restrictions Weight Bearing Restrictions: No   Pertinent Vitals/Pain Back chronic 3/10      Mobility  Bed Mobility Overal bed mobility: Modified Independent Transfers Overall transfer level: Modified independent Equipment used: Rolling walker (2 wheeled) Ambulation/Gait Ambulation/Gait assistance: Modified independent (Device/Increase time) Ambulation Distance (Feet): 240 Feet Assistive device: Rolling walker (2 wheeled) Gait velocity interpretation: Below normal speed for age/gender         PT Diagnosis:   N/A   PT Goals(Current goals can be found in the care plan section) Acute Rehab PT Goals PT Goal Formulation: No goals set, d/c therapy  Visit Information  Last PT Received On: 01/19/14 History of  Present Illness: Joanna Perez is a 58 y.o. female with past medical history that includes chronic respiratory failure on 4 L of oxygen at home, COPD, diastolic heart failure, diabetes, A. fib since emergency department with chief complaint of shortness of breath. She reports a four-day history of worsening shortness of breath. Of note patient hospitalized 2 times in January for same       Prior [place] expects to be discharged to:: Private residence Living Arrangements: Alone Available Help at Discharge: Family;Available PRN/intermittently Type of Home: House Home Access: Ramped entrance Home Layout: One level Home Equipment: [place] - 2 wheels Prior Function Level of Independence: Independent with assistive device(s) Comments: usually uses rolling walker or quad cane as well as O2 Communication Communication: No difficulties Dominant Hand: Right    Cognition  Cognition Arousal/Alertness: Awake/alert Overall Cognitive Status: Within Functional Limits for tasks assessed    Extremity/Trunk Assessment Lower Extremity Assessment Lower Extremity Assessment: Overall WFL for tasks assessed   Balance    End of Session PT - End of Session Equipment Utilized During Treatment: Gait belt Activity Tolerance: Patient tolerated treatment well (SOB but this is normal for pt with activity) Patient left: in chair (pt being transferred to 313 left in wheelchair) Nurse Communication: Mobility status  GP     Audryana Hockenberry,CINDY 01/19/2014, 11:53 AM

## 2014-01-20 DIAGNOSIS — R0989 Other specified symptoms and signs involving the circulatory and respiratory systems: Secondary | ICD-10-CM

## 2014-01-20 DIAGNOSIS — R0609 Other forms of dyspnea: Secondary | ICD-10-CM

## 2014-01-20 LAB — GLUCOSE, CAPILLARY
GLUCOSE-CAPILLARY: 167 mg/dL — AB (ref 70–99)
Glucose-Capillary: 155 mg/dL — ABNORMAL HIGH (ref 70–99)
Glucose-Capillary: 140 mg/dL — ABNORMAL HIGH (ref 70–99)
Glucose-Capillary: 180 mg/dL — ABNORMAL HIGH (ref 70–99)

## 2014-01-20 LAB — CBC
HCT: 37.7 % (ref 36.0–46.0)
Hemoglobin: 11.9 g/dL — ABNORMAL LOW (ref 12.0–15.0)
MCH: 25.3 pg — ABNORMAL LOW (ref 26.0–34.0)
MCHC: 31.6 g/dL (ref 30.0–36.0)
MCV: 80.2 fL (ref 78.0–100.0)
Platelets: 314 10*3/uL (ref 150–400)
RBC: 4.7 MIL/uL (ref 3.87–5.11)
RDW: 15.8 % — AB (ref 11.5–15.5)
WBC: 12.7 10*3/uL — ABNORMAL HIGH (ref 4.0–10.5)

## 2014-01-20 LAB — BASIC METABOLIC PANEL
BUN: 25 mg/dL — ABNORMAL HIGH (ref 6–23)
CHLORIDE: 94 meq/L — AB (ref 96–112)
CO2: 41 mEq/L (ref 19–32)
Calcium: 10.2 mg/dL (ref 8.4–10.5)
Creatinine, Ser: 0.66 mg/dL (ref 0.50–1.10)
GFR calc Af Amer: >90 mL/min (ref >90)
GFR calc non Af Amer: >90 mL/min (ref >90)
Glucose, Bld: 169 mg/dL — ABNORMAL HIGH (ref 70–99)
POTASSIUM: 3.6 meq/L — AB (ref 3.7–5.3)
SODIUM: 143 meq/L (ref 137–147)

## 2014-01-20 MED ORDER — DEXTROSE 5 % IV SOLN
1.0000 g | Freq: Three times a day (TID) | INTRAVENOUS | Status: DC
  Administered 2014-01-20 – 2014-01-22 (×6): 1 g via INTRAVENOUS
  Filled 2014-01-20 (×10): qty 1

## 2014-01-20 MED ORDER — VANCOMYCIN HCL 10 G IV SOLR
1500.0000 mg | Freq: Once | INTRAVENOUS | Status: AC
  Administered 2014-01-20: 1500 mg via INTRAVENOUS
  Filled 2014-01-20: qty 1500

## 2014-01-20 MED ORDER — PREDNISONE 20 MG PO TABS
40.0000 mg | ORAL_TABLET | Freq: Every day | ORAL | Status: DC
  Administered 2014-01-21 – 2014-01-22 (×2): 40 mg via ORAL
  Filled 2014-01-20 (×2): qty 2

## 2014-01-20 MED ORDER — HYDROCODONE-ACETAMINOPHEN 10-325 MG PO TABS
1.0000 | ORAL_TABLET | ORAL | Status: DC | PRN
  Administered 2014-01-20 – 2014-01-22 (×6): 1 via ORAL
  Filled 2014-01-20 (×6): qty 1

## 2014-01-20 MED ORDER — POTASSIUM CHLORIDE CRYS ER 20 MEQ PO TBCR
40.0000 meq | EXTENDED_RELEASE_TABLET | Freq: Once | ORAL | Status: AC
  Administered 2014-01-20: 40 meq via ORAL
  Filled 2014-01-20: qty 2

## 2014-01-20 MED ORDER — VANCOMYCIN HCL IN DEXTROSE 1-5 GM/200ML-% IV SOLN
1000.0000 mg | Freq: Two times a day (BID) | INTRAVENOUS | Status: DC
  Administered 2014-01-21 – 2014-01-22 (×3): 1000 mg via INTRAVENOUS
  Filled 2014-01-20 (×5): qty 200

## 2014-01-20 NOTE — Progress Notes (Signed)
ANTIBIOTIC CONSULT NOTE - INITIAL  Pharmacy Consult for Vancomycin and Aztreonam Indication: pneumonia  Allergies  Allergen Reactions  . Amoxicillin Hives  . Bactrim [Sulfamethoxazole-Tmp Ds] Hives  . Erythromycin Hives  . Keflex [Cephalexin] Hives  . Penicillins Itching and Swelling    Sweating   Patient Measurements: Height: 5\' 2"  (157.5 cm) Weight: 197 lb 8.5 oz (89.6 kg) IBW/kg (Calculated) : 50.1  Vital Signs: Temp: 97.9 F (36.6 C) (02/28 0553) Temp src: Oral (02/28 0553) BP: 161/90 mmHg (02/28 0553) Pulse Rate: 81 (02/28 0553) Intake/Output from previous day: 02/27 0701 - 02/28 0700 In: 953.2 [P.O.:720; I.V.:233.2] Out: 1700 [Urine:1700] Intake/Output from this shift: Total I/O In: -  Out: 1400 [Urine:1400]  Labs:  Recent Labs  01/18/14 0505 01/19/14 0404 01/20/14 0610  WBC 6.5 11.1* 12.7*  HGB 11.2* 11.7* 11.9*  PLT 275 325 314  CREATININE 0.69 0.77 0.66   Estimated Creatinine Clearance: 80.7 ml/min (by C-G formula based on Cr of 0.66). No results found for this basename: Letta Median, VANCORANDOM, Annabella, GENTPEAK, GENTRANDOM, TOBRATROUGH, TOBRAPEAK, TOBRARND, AMIKACINPEAK, AMIKACINTROU, AMIKACIN,  in the last 72 hours   Microbiology: Recent Results (from the past 720 hour(s))  URINE CULTURE     Status: None   Collection Time    01/17/14  9:01 AM      Result Value Ref Range Status   Specimen Description URINE, CATHETERIZED   Final   Special Requests NONE   Final   Culture  Setup Time     Final   Value: 01/17/2014 10:30     Performed at [facility]     Final   Value: NO GROWTH     Performed at [facility]   Culture     Final   Value: NO GROWTH     Performed at [facility]   Report Status 01/18/2014 FINAL   Final   Medical History: Past Medical History  Diagnosis Date  . Asthma   . COPD (chronic obstructive pulmonary disease)   . Hypothyroidism   . Essential hypertension, benign    . Hyperlipidemia   . Type 2 diabetes mellitus   . Atrial fibrillation   . MI (myocardial infarction)     2012  . Pericardial effusion 09/09/2013  . CHF (congestive heart failure)   . On home O2     4L N/C continuous   Medications:  Scheduled:  . ALPRAZolam  0.5-1 mg Oral TID  . antiseptic oral rinse  15 mL Mouth Rinse BID  . apixaban  5 mg Oral BID  . aztreonam  1 g Intravenous 3 times per day  . budesonide-formoterol  2 puff Inhalation BID  . citalopram  10 mg Oral Daily  . diltiazem  240 mg Oral Daily  . furosemide  40 mg Oral Daily  . gabapentin  100 mg Oral TID  . guaiFENesin  1,200 mg Oral BID  . insulin aspart  0-15 Units Subcutaneous TID WC  . insulin aspart  0-5 Units Subcutaneous QHS  . insulin aspart  5 Units Subcutaneous TID WC  . ipratropium  0.5 mg Nebulization Q4H  . levalbuterol  0.63 mg Nebulization Q4H  . levothyroxine  112 mcg Oral QAC breakfast  . pantoprazole  40 mg Oral Daily  . potassium chloride  40 mEq Oral Once  . [START ON 01/21/2014] predniSONE  40 mg Oral Q breakfast  . ramipril  5 mg Oral Daily  . simvastatin  40 mg Oral  [id number]  . sodium chloride  10-40 mL Intracatheter Q12H  . sodium chloride  3 mL Intravenous Q12H  . theophylline  400 mg Oral Q12H  . vancomycin  1,500 mg Intravenous Once  . [START ON 01/21/2014] vancomycin  1,000 mg Intravenous Q12H   Assessment: 58yo female with chronic respiratory failure.  Antibiotics being broadened today with Vancomycin and Aztreonam.  Estimated Creatinine Clearance: 80.7 ml/min (by C-G formula based on Cr of 0.66).  Goal of Therapy:  Vancomycin trough level 15-20 mcg/ml  Plan:  Vancomycin 1500mg  IV now x 1 dose (loading dose) then Vancomycin 1000mg  IV q12hrs Check trough at steady state Aztreonam 1gm IV q8hrs Monitor labs, renal fxn, and cultures  Hart Robinsons A 01/20/2014,2:58 PM

## 2014-01-20 NOTE — Progress Notes (Signed)
[facility] PROGRESS NOTE  Joanna Perez [id number] DOB: October 11, 1956 DOA: 01/17/2014 PCP: Glo Herring., MD  Assessment/Plan: 1. Acute on chronic respiratory failure, multifactorial due to COPD and diastolic congestive heart failure. Improving slowly.  Patient is on 4L [place] which is her home dose. Continues with mild increased work of breathing. Improved air flow. Will continue steroids but taper, nebs.  2. Acute on chronic diastolic congestive heart failure. Mild.  Cardiology following.  Appears to be euvolemic.  Back on home dose of lasix.  3. COPD exacerbation. continue steroids with taper, nebulizer treatments and antibiotics. Her baseline functional status is very poor. She is chronically on 4 L of oxygen. Not quite at baseline but improving. 4. HCAP. Patient found to have worsening RUL infiltrate on chest xray yesterday.  Wbc count trending up.  She has been on levaquin for last few days.  Will broaden coverage to vancomycin and aztreonam.  5. Atrial fibrillation, paroxysmal. Remains in sinus rhythm. Continue  Cardizem. She is on apixaban for anticoagulation 6. Diabetes. Fair control. CBG range 176-194.  Currently on sliding scale insulin. Will change level to resistant and add meal coverage as her appetite is excellent.  metformin on hold. 7. Hypothyroidism. Continue Synthroid.   Code Status: full Family Communication: none present Disposition Plan: home when improved   Consultants:  cardiology  Procedures:  none  Antibiotics:  levaquin 01/17/14>>>2/28  Vancomycin 2/28>>  Aztreonam 2/28  HPI/Subjective: Feeling better.  Coughing up sputum. Feels that she is approaching baseline.  Objective: Filed Vitals:   01/20/14 0553  BP: 161/90  Pulse: 81  Temp: 97.9 F (36.6 C)  Resp: 22    Intake/Output Summary (Last 24 hours) at 01/20/14 1443 Last data filed at 01/20/14 1140  Gross per 24 hour  Intake 473.17 ml  Output   2400 ml  Net -1926.83 ml    Filed Weights   01/18/14 0500 01/19/14 0500 01/20/14 0553  Weight: 91 kg (200 lb 9.9 oz) 90.1 kg (198 lb 10.2 oz) 89.6 kg (197 lb 8.5 oz)    Exam:   General:  Obese, NAD  Cardiovascular: RRR No MGR no edema b/l  Respiratory: mild increased work of breathing with conversation. Improved air flow with coarse BS and faint expiratory wheeze  Abdomen: obese soft +BS non-tender to palpation  Musculoskeletal: no clubbing or cyanosis   Data Reviewed: Basic Metabolic Panel:  Recent Labs Lab 01/17/14 0859 01/18/14 0505 01/19/14 0404 01/20/14 0610  NA 134* 144 144 143  K 4.1 4.1 4.0 3.6*  CL 93* 98 96 94*  CO2 30 35* 38* 41*  GLUCOSE 139* 212* 179* 169*  BUN 20 22 24* 25*  CREATININE 1.03 0.69 0.77 0.66  CALCIUM 9.0 9.6 10.1 10.2   Liver Function Tests:  Recent Labs Lab 01/18/14 0505  AST 9  ALT 7  ALKPHOS 85  BILITOT <0.2*  PROT 7.1  ALBUMIN 2.8*   No results found for this basename: LIPASE, AMYLASE,  in the last 168 hours No results found for this basename: AMMONIA,  in the last 168 hours CBC:  Recent Labs Lab 01/17/14 0859 01/18/14 0505 01/19/14 0404 01/20/14 0610  WBC 6.5 6.5 11.1* 12.7*  NEUTROABS 4.3  --   --   --   HGB 10.6* 11.2* 11.7* 11.9*  HCT 33.3* 35.3* 37.4 37.7  MCV 80.0 79.7 81.0 80.2  PLT 252 275 325 314   Cardiac Enzymes:  Recent Labs Lab 01/17/14 0859  TROPONINI <0.30   BNP (last 3 results)  Recent Labs  12/18/13 0838 12/20/13 0615 01/17/14 0859  PROBNP 258.8* 1475.0* 1249.0*   CBG:  Recent Labs Lab 01/19/14 1137 01/19/14 1629 01/19/14 2001 01/20/14 0713 01/20/14 1149  GLUCAP 224* 129* 188* 155* 167*    Recent Results (from the past 240 hour(s))  URINE CULTURE     Status: None   Collection Time    01/17/14  9:01 AM      Result Value Ref Range Status   Specimen Description URINE, CATHETERIZED   Final   Special Requests NONE   Final   Culture  Setup Time     Final   Value: 01/17/2014 10:30     Performed at  [place]     Final   Value: NO GROWTH     Performed at [facility]   Culture     Final   Value: NO GROWTH     Performed at [facility]   Report Status 01/18/2014 FINAL   Final     Studies: Dg Chest Port 1 View  01/19/2014   CLINICAL DATA:  Shortness of breath  EXAM: PORTABLE CHEST - 1 VIEW  COMPARISON:  DG CHEST 1V PORT dated 01/17/2014; DG CHEST 1V PORT dated 12/20/2013; DG CHEST 1V PORT dated 12/18/2013  FINDINGS: Examination is again degraded due to patient body habitus and portable technique.  Grossly unchanged enlarged cardiac silhouette and mediastinal contours with atherosclerotic plaque within the thoracic aorta. Interval repositioning of right upper extremity approach PICC line with tip now projected over the superior cavoatrial junction grossly unchanged diffuse nodular thickening of the pulmonary interstitium with worsening heterogeneous airspace opacities within the right upper lung. No definite pleural effusion or pneumothorax. Grossly unchanged bones.  IMPRESSION: 1. Findings suggestive of a worsening right upper lobe pneumonia (including atypical etiologies) superimposed on airways disease. 2. Repositioning of right upper extremity approach PICC line with tip now projected over the superior cavoatrial junction.   Electronically Signed   By: Sandi Mariscal M.D.   On: 01/19/2014 07:42    Scheduled Meds: . ALPRAZolam  0.5-1 mg Oral TID  . antiseptic oral rinse  15 mL Mouth Rinse BID  . apixaban  5 mg Oral BID  . budesonide-formoterol  2 puff Inhalation BID  . citalopram  10 mg Oral Daily  . diltiazem  240 mg Oral Daily  . furosemide  40 mg Oral Daily  . gabapentin  100 mg Oral TID  . guaiFENesin  1,200 mg Oral BID  . insulin aspart  0-15 Units Subcutaneous TID WC  . insulin aspart  0-5 Units Subcutaneous QHS  . insulin aspart  5 Units Subcutaneous TID WC  . ipratropium  0.5 mg Nebulization Q4H  . levalbuterol  0.63 mg Nebulization Q4H   . levofloxacin  500 mg Oral Daily  . levothyroxine  112 mcg Oral QAC breakfast  . pantoprazole  40 mg Oral Daily  . potassium chloride  40 mEq Oral Once  . [START ON 01/21/2014] predniSONE  40 mg Oral Q breakfast  . ramipril  5 mg Oral Daily  . simvastatin  40 mg Oral q1800  . sodium chloride  10-40 mL Intracatheter Q12H  . sodium chloride  3 mL Intravenous Q12H  . theophylline  400 mg Oral Q12H   Continuous Infusions:   Principal Problem:   Acute on chronic respiratory failure Active Problems:   Type 2 diabetes mellitus   Atrial fibrillation   Chronic diastolic heart failure  COPD with exacerbation   Dyspnea   Hypotension    Time spent: 30 minutes    [facility] Pager [phone]. If 7PM-7AM, please contact night-coverage at [email or web address], password [facility] 01/20/2014, 2:43 PM  LOS: 3 days

## 2014-01-20 NOTE — Progress Notes (Signed)
CRITICAL VALUE ALERT  Critical value received: CO2 Date of notification: 01/20/14  Time of notification:  0805  Critical value read back:yes  Nurse who received alert:  j Osric Klopf rn  MD notified (1st page):  Memon, text page   Time of first page:  [phone]

## 2014-01-21 LAB — BASIC METABOLIC PANEL
BUN: 24 mg/dL — ABNORMAL HIGH (ref 6–23)
CO2: 41 meq/L — AB (ref 19–32)
CREATININE: 0.64 mg/dL (ref 0.50–1.10)
Calcium: 10 mg/dL (ref 8.4–10.5)
Chloride: 95 mEq/L — ABNORMAL LOW (ref 96–112)
GFR calc Af Amer: >90 mL/min (ref >90)
GFR calc non Af Amer: >90 mL/min (ref >90)
Glucose, Bld: 114 mg/dL — ABNORMAL HIGH (ref 70–99)
Potassium: 3.6 mEq/L — ABNORMAL LOW (ref 3.7–5.3)
Sodium: 142 mEq/L (ref 137–147)

## 2014-01-21 LAB — GLUCOSE, CAPILLARY
Glucose-Capillary: 103 mg/dL — ABNORMAL HIGH (ref 70–99)
Glucose-Capillary: 127 mg/dL — ABNORMAL HIGH (ref 70–99)
Glucose-Capillary: 267 mg/dL — ABNORMAL HIGH (ref 70–99)

## 2014-01-21 MED ORDER — FUROSEMIDE 20 MG PO TABS
20.0000 mg | ORAL_TABLET | Freq: Every day | ORAL | Status: DC
  Administered 2014-01-21 – 2014-01-22 (×2): 20 mg via ORAL
  Filled 2014-01-21 (×2): qty 1

## 2014-01-21 MED ORDER — LEVALBUTEROL HCL 0.63 MG/3ML IN NEBU
0.6300 mg | INHALATION_SOLUTION | Freq: Four times a day (QID) | RESPIRATORY_TRACT | Status: DC
  Administered 2014-01-21 – 2014-01-22 (×5): 0.63 mg via RESPIRATORY_TRACT
  Filled 2014-01-21 (×5): qty 3

## 2014-01-21 MED ORDER — IPRATROPIUM BROMIDE 0.02 % IN SOLN
0.5000 mg | Freq: Four times a day (QID) | RESPIRATORY_TRACT | Status: DC
  Administered 2014-01-21 – 2014-01-22 (×5): 0.5 mg via RESPIRATORY_TRACT
  Filled 2014-01-21 (×5): qty 2.5

## 2014-01-21 NOTE — Progress Notes (Addendum)
CRITICAL VALUE ALERT  Critical value received:  CO2 41 Date of notification:  01/21/14  Time of notification:  0736  Critical value read back:yes  Nurse who received alert:  j Sherrilynn Gudgel rn  Consistent with previous value

## 2014-01-21 NOTE — Progress Notes (Signed)
[facility] PROGRESS NOTE  Joanna Perez [id number] DOB: 08-20-1956 DOA: 01/17/2014 PCP: Glo Herring., MD  Assessment/Plan: 1. Acute on chronic respiratory failure, multifactorial due to COPD and diastolic congestive heart failure. Improving slowly.  Patient is on 4L [place] which is her home dose. Continues with mild increased work of breathing. Improved air flow. Will continue steroid taper, nebs.  2. Acute on chronic diastolic congestive heart failure. Mild.  Cardiology following.  Appears to be euvolemic.  She was still having significant diuresis with 40 of IV Lasix. This has been changed to 20 mg and we will observe her volume status..  3. COPD exacerbation. continue steroids with taper, nebulizer treatments and antibiotics. Her baseline functional status is very poor. She is chronically on 4 L of oxygen. Appears to be approaching baseline. 4. HCAP. Patient found to have worsening RUL infiltrate on chest xray.  Wbc count was trending up.  She had been on levaquin for 3 days prior to xray.  Antibiotic coverage was broadened to vancomycin and Aztreonam. We'll continue broad-spectrum antibiotics for another 24 hours. Repeat chest x-ray and blood work tomorrow.  5. Atrial fibrillation, paroxysmal. Remains in sinus rhythm. Continue  Cardizem. She is on apixaban for anticoagulation 6. Diabetes. Fair control.  Currently on sliding scale insulin. Will change level to resistant and add meal coverage as her appetite is excellent.  metformin on hold. 7. Hypothyroidism. Continue Synthroid.   Code Status: full Family Communication: none present Disposition Plan: home when improved   Consultants:  cardiology  Procedures:  none  Antibiotics:  levaquin 01/17/14>>>2/28  Vancomycin 2/28>>  Aztreonam 2/28  HPI/Subjective: Feeling better.  Coughing up sputum. Feels that she is approaching baseline.  Objective: Filed Vitals:   01/21/14 1500  BP: 130/79  Pulse: 90  Temp: 98.1  F (36.7 C)  Resp: 20    Intake/Output Summary (Last 24 hours) at 01/21/14 1731 Last data filed at 01/21/14 1128  Gross per 24 hour  Intake    300 ml  Output   2200 ml  Net  -1900 ml   Filed Weights   01/20/14 0553 01/21/14 0500 01/21/14 0629  Weight: 89.6 kg (197 lb 8.5 oz) 88.6 kg (195 lb 5.2 oz) 88.2 kg (194 lb 7.1 oz)    Exam:   General:  Obese, NAD  Cardiovascular: RRR No MGR no edema b/l  Respiratory: coarse breath sounds that clear after coughing, mild wheeze noted.  Abdomen: obese soft +BS non-tender to palpation  Musculoskeletal: no clubbing or cyanosis   Data Reviewed: Basic Metabolic Panel:  Recent Labs Lab 01/17/14 0859 01/18/14 0505 01/19/14 0404 01/20/14 0610 01/21/14 0613  NA 134* 144 144 143 142  K 4.1 4.1 4.0 3.6* 3.6*  CL 93* 98 96 94* 95*  CO2 30 35* 38* 41* 41*  GLUCOSE 139* 212* 179* 169* 114*  BUN 20 22 24* 25* 24*  CREATININE 1.03 0.69 0.77 0.66 0.64  CALCIUM 9.0 9.6 10.1 10.2 10.0   Liver Function Tests:  Recent Labs Lab 01/18/14 0505  AST 9  ALT 7  ALKPHOS 85  BILITOT <0.2*  PROT 7.1  ALBUMIN 2.8*   No results found for this basename: LIPASE, AMYLASE,  in the last 168 hours No results found for this basename: AMMONIA,  in the last 168 hours CBC:  Recent Labs Lab 01/17/14 0859 01/18/14 0505 01/19/14 0404 01/20/14 0610  WBC 6.5 6.5 11.1* 12.7*  NEUTROABS 4.3  --   --   --   HGB 10.6* 11.2*  11.7* 11.9*  HCT 33.3* 35.3* 37.4 37.7  MCV 80.0 79.7 81.0 80.2  PLT 252 275 325 314   Cardiac Enzymes:  Recent Labs Lab 01/17/14 0859  TROPONINI <0.30   BNP (last 3 results)  Recent Labs  12/18/13 0838 12/20/13 0615 01/17/14 0859  PROBNP 258.8* 1475.0* 1249.0*   CBG:  Recent Labs Lab 01/20/14 1623 01/20/14 2041 01/21/14 0738 01/21/14 1123 01/21/14 1700  GLUCAP 140* 180* 103* 127* 267*    Recent Results (from the past 240 hour(s))  URINE CULTURE     Status: None   Collection Time    01/17/14  9:01 AM       Result Value Ref Range Status   Specimen Description URINE, CATHETERIZED   Final   Special Requests NONE   Final   Culture  Setup Time     Final   Value: 01/17/2014 10:30     Performed at [place]     Final   Value: NO GROWTH     Performed at [facility]   Culture     Final   Value: NO GROWTH     Performed at [facility]   Report Status 01/18/2014 FINAL   Final     Studies: No results found.  Scheduled Meds: . ALPRAZolam  0.5-1 mg Oral TID  . antiseptic oral rinse  15 mL Mouth Rinse BID  . apixaban  5 mg Oral BID  . aztreonam  1 g Intravenous 3 times per day  . budesonide-formoterol  2 puff Inhalation BID  . citalopram  10 mg Oral Daily  . diltiazem  240 mg Oral Daily  . furosemide  20 mg Oral Daily  . gabapentin  100 mg Oral TID  . guaiFENesin  1,200 mg Oral BID  . insulin aspart  0-15 Units Subcutaneous TID WC  . insulin aspart  0-5 Units Subcutaneous QHS  . insulin aspart  5 Units Subcutaneous TID WC  . ipratropium  0.5 mg Nebulization Q6H  . levalbuterol  0.63 mg Nebulization Q6H  . levothyroxine  112 mcg Oral QAC breakfast  . pantoprazole  40 mg Oral Daily  . predniSONE  40 mg Oral Q breakfast  . ramipril  5 mg Oral Daily  . simvastatin  40 mg Oral q1800  . sodium chloride  10-40 mL Intracatheter Q12H  . sodium chloride  3 mL Intravenous Q12H  . theophylline  400 mg Oral Q12H  . vancomycin  1,000 mg Intravenous Q12H   Continuous Infusions:   Principal Problem:   Acute on chronic respiratory failure Active Problems:   Type 2 diabetes mellitus   Atrial fibrillation   Chronic diastolic heart failure   COPD with exacerbation   Dyspnea   Hypotension    Time spent: 30 minutes    [place] [facility] Pager [phone]. If 7PM-7AM, please contact night-coverage at [email or web address], password [facility] 01/21/2014, 5:31 PM  LOS: 4 days

## 2014-01-22 ENCOUNTER — Inpatient Hospital Stay (HOSPITAL_COMMUNITY): Payer: Medicaid Other

## 2014-01-22 LAB — BASIC METABOLIC PANEL
BUN: 22 mg/dL (ref 6–23)
CALCIUM: 9.5 mg/dL (ref 8.4–10.5)
CHLORIDE: 95 meq/L — AB (ref 96–112)
CO2: 40 mEq/L (ref 19–32)
CREATININE: 0.56 mg/dL (ref 0.50–1.10)
GFR calc non Af Amer: >90 mL/min (ref >90)
Glucose, Bld: 112 mg/dL — ABNORMAL HIGH (ref 70–99)
Potassium: 3.8 mEq/L (ref 3.7–5.3)
Sodium: 141 mEq/L (ref 137–147)

## 2014-01-22 LAB — GLUCOSE, CAPILLARY
Glucose-Capillary: 268 mg/dL — ABNORMAL HIGH (ref 70–99)
Glucose-Capillary: 79 mg/dL (ref 70–99)
Glucose-Capillary: 153 mg/dL — ABNORMAL HIGH (ref 70–99)

## 2014-01-22 LAB — CBC
HCT: 39 % (ref 36.0–46.0)
Hemoglobin: 12.2 g/dL (ref 12.0–15.0)
MCH: 25.2 pg — AB (ref 26.0–34.0)
MCHC: 31.3 g/dL (ref 30.0–36.0)
MCV: 80.4 fL (ref 78.0–100.0)
PLATELETS: 272 10*3/uL (ref 150–400)
RBC: 4.85 MIL/uL (ref 3.87–5.11)
RDW: 15.9 % — ABNORMAL HIGH (ref 11.5–15.5)
WBC: 11.5 10*3/uL — AB (ref 4.0–10.5)

## 2014-01-22 MED ORDER — FUROSEMIDE 20 MG PO TABS: 40.0000 mg | ORAL_TABLET | Freq: Every day | ORAL | Status: DC

## 2014-01-22 MED ORDER — PREDNISONE 10 MG PO TABS: ORAL_TABLET | ORAL | Status: DC

## 2014-01-22 MED ORDER — RAMIPRIL 5 MG PO CAPS: 5.0000 mg | ORAL_CAPSULE | Freq: Every day | ORAL | Status: DC

## 2014-01-22 MED ORDER — DOXYCYCLINE HYCLATE 50 MG PO CAPS: 100.0000 mg | ORAL_CAPSULE | Freq: Two times a day (BID) | ORAL | Status: DC

## 2014-01-22 MED ORDER — ALBUTEROL SULFATE (2.5 MG/3ML) 0.083% IN NEBU: 2.5000 mg | INHALATION_SOLUTION | Freq: Four times a day (QID) | RESPIRATORY_TRACT | Status: AC | PRN

## 2014-01-22 MED ORDER — FUROSEMIDE 20 MG PO TABS: 20.0000 mg | ORAL_TABLET | Freq: Every day | ORAL | Status: DC

## 2014-01-22 NOTE — Care Management Note (Signed)
UR completed 

## 2014-01-22 NOTE — Progress Notes (Signed)
Discontinue PICC line RUA without difficulty, applied vaseline gauze and 4x4's, pressure held x 3 minutes, secured with hypafix tape, instructed to leave dressing in place for 24 hours, verbalized understanding.

## 2014-01-22 NOTE — Progress Notes (Signed)
Nutrition Brief Note  RD pulled to chart due to LOS.  Wt Readings from Last 15 Encounters:  01/22/14 195 lb 12.3 oz (88.8 kg)  12/22/13 191 lb 12.8 oz (87 kg)  11/29/13 203 lb 12.8 oz (92.443 kg)  11/03/13 210 lb (95.255 kg)  10/27/13 210 lb (95.255 kg)  09/29/13 191 lb (86.637 kg)  09/12/13 198 lb 6.6 oz (90 kg)  08/27/13 198 lb (89.812 kg)  12/18/12 186 lb (84.369 kg)  11/11/11 175 lb 12.8 oz (79.742 kg)  11/11/11 175 lb 12.8 oz (79.742 kg)  09/23/10 197 lb (89.359 kg)    Body mass index is 35.8 kg/(m^2). Patient meets criteria for obesity, class II based on current BMI.   Current diet order is Heart Healthy/ Carb Modified, patient is consuming approximately 50-100% of meals at this time. Labs and medications reviewed.   No nutrition interventions warranted at this time. If nutrition issues arise, please consult RD.   Renee Erb A. Jimmye Norman, RD, LDN Pager: [phone]

## 2014-01-22 NOTE — Discharge Summary (Signed)
Patient seen and examined.  Above note reviewed.  Her respiratory status appears to be near her baseline. Patient feels ready to discharge home. She's been afebrile. She is on her chronic 4 L of oxygen saturations are in the 90s. She's been transitioned to doxycycline to cover for pneumonia. Chest x-ray indicates improving in interstitial infiltrate. Regarding her Lasix, she reports taking 40 mg twice a day prior to admission. During her hospital stay, she had received 40 mg once daily, which was then reduced to 20 mg once a day. In the lower dosing, the patient was still making 3 and half liters of urine per day. Cardiology was following the patient, but signed off since they felt that her heart failure was compensated. Her lower shin edema has resolved. She continues to have significant urine output. It is unclear as to how much fluid the patient is really drinking at home. She is always with home health services. We will discharge her on 40 mg of Lasix once a day. We'll schedule close follow up with cardiology to further adjust his Lasix dose as needed. Patient is also on albuterol and theophylline for COPD. I have requested that the staff schedule the patient to see Dr. Luan Pulling as an outpatient for pulmonary care.  Marcell Pfeifer

## 2014-01-22 NOTE — Progress Notes (Signed)
Discharge instructions and prescriptions given, verbalized understanding, out in stable condition with oxygen intact and staff via w/c.

## 2014-01-22 NOTE — Discharge Summary (Signed)
Physician Discharge Summary  Joanna Perez WNI:[id number] DOB: Nov 15, 1956 DOA: 01/17/2014  PCP: Glo Herring., MD  Admit date: 01/17/2014 Discharge date: 01/22/2014  Time spent: 40 minutes  Recommendations for Outpatient Follow-up:  1. Dr. Nolon Rod office contacted and they will contact patient with appointment. Recommend trending of respiratory status and volume status as medications adjusted.  2. Discharged to home with home health 3. Joanna Perez 01/26/14 for CHF follow up. Lasix changed from 40mg  BID to 40mg  daily.   Discharge Diagnoses:  Principal Problem:   Acute on chronic respiratory failure Active Problems:   Type 2 diabetes mellitus   Atrial fibrillation   Chronic diastolic heart failure   COPD with exacerbation   Dyspnea   Hypotension   Discharge Condition: fair  Diet recommendation: carb modified  Filed Weights   01/21/14 0500 01/21/14 0629 01/22/14 3818  Weight: 88.6 kg (195 lb 5.2 oz) 88.2 kg (194 lb 7.1 oz) 88.8 kg (195 lb 12.3 oz)    History of present illness:  Joanna Perez is a 58 y.o. female with past medical history that includes chronic respiratory failure on 4 L of oxygen at home, COPD, diastolic heart failure, diabetes, A. fib presented to emergency department on 01/17/14 with chief complaint of shortness of breath. She reported a four-day history of worsening shortness of breath. Of note patient hospitalized 2 times in January for same. She stated she did well for "a couple of days and then gradually her respiratory status began to decline. The previous 4 days she developed a productive cough with worsening wheezing. Associated symptoms included generalized weakness, decreased stamina, chills and achiness as well as a 6 pound weight gain over the previous 4 days . She also had chronic abdominal pain and chronic back pain. She reported the back pain worsening over the previous couple of days and her activity was less. She did report not having smoked  in the last 2 months. She denied any nausea vomiting. No report of constipation or diarrhea. She did describe some urinary urgency but no dysuria. Workup in the emergency department revealed acute on chronic respiratory failure with the oxygen saturation level of 84% on 4 L of oxygen, hypertension, tachycardia, a chest x-ray with vascular congestion, proBNP 1249. She was given Lasix 40 mg IV on hour-long nebulizer treatment and 125 mg of Solu-Medrol and admittied   Hospital Course:  Acute on chronic respiratory failure, multifactorial due to COPD and diastolic congestive heart failure. She was admitted and provided with nebulizer treatment, IV steroids, Levaquin. She improved slowly. At discharge patient is on 4L [place] which is her home dose and oxygen saturation level 96%. Continues with mild increased work of breathing but with improved air flow. At discharge will continue with slow prednisone taper. Recommend close OP follow up with PCP. Dr. Nolon Rod office contacted and they will call patient with appointment.    Acute on chronic diastolic congestive heart failure. Mild. Some LE edema and chest xray with findings consistent with pulmonary edema. Given on dose IV lasix in ED and BP dropped to 70's. BP resolved and home dose lasix started. She had significant diuresis with Lasix at 40mg  so home dose changed to 20mg  daily. Appears to be euvolemic discharge. Discharged on Lasix 40 mg daily.  Recommend daily weights and taking data to cards appointment on 01/26/14.   COPD exacerbation. continue steroids with taper, nebulizer treatments and antibiotics. Her baseline functional status is very poor. She is chronically on 4 L of oxygen. At discharge  appears to be at baseline.   HCAP. Patient found to have worsening RUL infiltrate on chest xray. Wbc count trended up day #3. She had been on levaquin for 3 days prior to xray. Antibiotic coverage was broadened to vancomycin and Aztreonam. She was provided with  broad-spectrum antibiotics for additional 24 hours. Repeat chest x-ray with RU lobe infiltrate improved. White count trending downward. She is afebrile and non-toxic at discharge. Will discharge with doxycycline 100 mg BID for 5 days given her allergies. Recommend close OP follow up with PCP 1 week.    Atrial fibrillation, paroxysmal. Remained in sinus rhythm. Continue Cardizem. She is on apixaban for anticoagulation   Diabetes. Fair control. Continue home regimen   Hypothyroidism. Continue Synthroid   Procedures:  none  Consultations:  none  Discharge Exam: Filed Vitals:   01/22/14 0621  BP: 125/63  Pulse: 82  Temp: 98.4 F (36.9 C)  Resp: 20    General: calm appears comfortable Cardiovascular: RRR No m/g/r/ No LE edema Respiratory: normal effort at rest. BS with improved air flow. BS coarse throughout with mild wheeze.  Discharge Instructions   Future Appointments Provider Department Dept Phone   02/05/2014 1:20 PM Satira Sark, MD [facility] Linna Hoff [phone]       Medication List    STOP taking these medications       oxyCODONE-acetaminophen 5-325 MG per tablet  Commonly known as:  PERCOCET/ROXICET      TAKE these medications       albuterol 108 (90 BASE) MCG/ACT inhaler  Commonly known as:  PROVENTIL HFA;VENTOLIN HFA  Inhale 1 puff into the lungs every 4 (four) hours as needed for wheezing or shortness of breath.     albuterol 2 MG tablet  Commonly known as:  PROVENTIL  Take 1 mg by mouth 3 (three) times daily.     ALPRAZolam 1 MG tablet  Commonly known as:  XANAX  Take 0.5-1 mg by mouth 3 (three) times daily.     apixaban 5 MG Tabs tablet  Commonly known as:  ELIQUIS  Take 1 tablet (5 mg total) by mouth 2 (two) times daily.     benzonatate 100 MG capsule  Commonly known as:  TESSALON PERLES  Take 1 capsule (100 mg total) by mouth 3 (three) times daily as needed for cough.     bisacodyl 5 MG EC tablet  Commonly known as:   DULCOLAX  Take 5 mg by mouth daily as needed for moderate constipation.     budesonide-formoterol 160-4.5 MCG/ACT inhaler  Commonly known as:  SYMBICORT  Inhale 2 puffs into the lungs.     citalopram 10 MG tablet  Commonly known as:  CELEXA  Take 10 mg by mouth daily.     diltiazem 240 MG 24 hr capsule  Commonly known as:  CARDIZEM CD  Take 1 capsule (240 mg total) by mouth daily.     doxycycline 50 MG capsule  Commonly known as:  VIBRAMYCIN  Take 2 capsules (100 mg total) by mouth 2 (two) times daily.     fexofenadine 180 MG tablet  Commonly known as:  ALLEGRA  Take 180 mg by mouth 2 (two) times daily.     fluconazole 150 MG tablet  Commonly known as:  DIFLUCAN  Take 1 tablet (150 mg total) by mouth every 3 (three) days as needed (vaginitis/yeast infection).     fluticasone 50 MCG/ACT nasal spray  Commonly known as:  FLONASE  Place 1 spray into both  nostrils.     furosemide 20 MG tablet  Commonly known as:  LASIX  Take 1 tablet (20 mg total) by mouth daily.     guaiFENesin 600 MG 12 hr tablet  Commonly known as:  MUCINEX  Take 600 mg by mouth 2 (two) times daily as needed for cough.     HYDROcodone-acetaminophen 10-325 MG per tablet  Commonly known as:  NORCO  Take 1 tablet by mouth every 12 (twelve) hours as needed for severe pain.     HYDROcodone-homatropine 5-1.5 MG/5ML syrup  Commonly known as:  HYCODAN  Take 5 mLs by mouth every 6 (six) hours as needed for cough.     levothyroxine 112 MCG tablet  Commonly known as:  SYNTHROID, LEVOTHROID  Take 112 mcg by mouth daily before breakfast.     metFORMIN 500 MG tablet  Commonly known as:  GLUCOPHAGE  Take 1 tablet (500 mg total) by mouth 2 (two) times daily with a meal.     NEURONTIN 100 MG capsule  Generic drug:  gabapentin  Take 100 mg by mouth 3 (three) times daily.     nystatin 100000 UNIT/ML suspension  Commonly known as:  MYCOSTATIN  Take 5 mLs (500,000 Units total) by mouth 4 (four) times daily.      pantoprazole 40 MG tablet  Commonly known as:  PROTONIX  Take 40 mg by mouth daily.     predniSONE 10 MG tablet  Commonly known as:  DELTASONE  Take 4 tabs on 01/23/14 then take 3 tabs for 3 days then take 2 tabs for 3 days then take 1 tab for 3 days then stop.     ramipril 5 MG capsule  Commonly known as:  ALTACE  Take 1 capsule (5 mg total) by mouth daily.     simvastatin 40 MG tablet  Commonly known as:  ZOCOR  Take 40 mg by mouth daily at 6 PM.     sodium chloride 0.65 % Soln nasal spray  Commonly known as:  OCEAN  Place 1 spray into both nostrils as needed for congestion (also available OTC).     theophylline 200 MG 12 hr tablet  Commonly known as:  THEODUR  Take 400 mg by mouth 2 (two) times daily.     vitamin C 1000 MG tablet  Take 1,000 mg by mouth daily.     vitamin E 1000 UNIT capsule  Take 1,000 Units by mouth daily.       Allergies  Allergen Reactions  . Amoxicillin Hives  . Bactrim [Sulfamethoxazole-Tmp Ds] Hives  . Erythromycin Hives  . Keflex [Cephalexin] Hives  . Penicillins Itching and Swelling    Sweating       Follow-up Information   Follow up with [facility].   Contact information:   [address] [place] [address] [phone]        The results of significant diagnostics from this hospitalization (including imaging, microbiology, ancillary and laboratory) are listed below for reference.    Significant Diagnostic Studies: Dg Chest Port 1 View  01/22/2014   CLINICAL DATA:  Pneumonia.  EXAM: PORTABLE CHEST - 1 VIEW  COMPARISON:  01/19/2014  FINDINGS: Patchy right upper lobe airspace opacity has improved since prior study. Heart is borderline in size. Mild interstitial prominence throughout the lungs, stable with mild peribronchial thickening. No effusions. No acute bony abnormality.  IMPRESSION: Improving patchy right upper lobe infiltrate.  Otherwise no change.   Electronically Signed   By: Lennette Bihari  [place] M.D.    On: 01/22/2014 08:15   Dg Chest Port 1 View  01/19/2014   CLINICAL DATA:  Shortness of breath  EXAM: PORTABLE CHEST - 1 VIEW  COMPARISON:  DG CHEST 1V PORT dated 01/17/2014; DG CHEST 1V PORT dated 12/20/2013; DG CHEST 1V PORT dated 12/18/2013  FINDINGS: Examination is again degraded due to patient body habitus and portable technique.  Grossly unchanged enlarged cardiac silhouette and mediastinal contours with atherosclerotic plaque within the thoracic aorta. Interval repositioning of right upper extremity approach PICC line with tip now projected over the superior cavoatrial junction grossly unchanged diffuse nodular thickening of the pulmonary interstitium with worsening heterogeneous airspace opacities within the right upper lung. No definite pleural effusion or pneumothorax. Grossly unchanged bones.  IMPRESSION: 1. Findings suggestive of a worsening right upper lobe pneumonia (including atypical etiologies) superimposed on airways disease. 2. Repositioning of right upper extremity approach PICC line with tip now projected over the superior cavoatrial junction.   Electronically Signed   By: Sandi Mariscal M.D.   On: 01/19/2014 07:42   Dg Chest Port 1 View  01/17/2014   CLINICAL DATA:  Status post PICC line placement  EXAM: PORTABLE CHEST - 1 VIEW  COMPARISON:  DG CHEST 1V PORT dated 01/17/2014  FINDINGS: The lungs are well-expanded. There is no evidence of a pneumothorax or pneumothorax. The PICC line tip lies in the region of the junction of the proximal and midportions of the SVC. The pulmonary interstitial markings remain increased. The cardiac silhouette is normal in size. The pulmonary vascularity is mildly prominent centrally.  IMPRESSION: 1. There is no evidence of postprocedure complication following placement of a right-sided PICC line. 2. There remain increased interstitial markings diffusely within the lungs. This may reflect a low-grade CHF superimposed upon underlying COPD.   Electronically Signed    By: David  Martinique   On: 01/17/2014 15:21   Dg Chest Port 1 View  01/17/2014   CLINICAL DATA:  Short of breath  EXAM: PORTABLE CHEST - 1 VIEW  COMPARISON:  DG CHEST 1V PORT dated 12/20/2013  FINDINGS: Low volumes. Vascular congestion. No consolidation. No pneumothorax. Basilar hypoaeration.  IMPRESSION: Vascular congestion and basilar hypoaeration.   Electronically Signed   By: Maryclare Bean M.D.   On: 01/17/2014 09:02    Microbiology: Recent Results (from the past 240 hour(s))  URINE CULTURE     Status: None   Collection Time    01/17/14  9:01 AM      Result Value Ref Range Status   Specimen Description URINE, CATHETERIZED   Final   Special Requests NONE   Final   Culture  Setup Time     Final   Value: 01/17/2014 10:30     Performed at [facility]     Final   Value: NO GROWTH     Performed at [facility]   Culture     Final   Value: NO GROWTH     Performed at [facility]   Report Status 01/18/2014 FINAL   Final     Labs: Basic Metabolic Panel:  Recent Labs Lab 01/18/14 0505 01/19/14 0404 01/20/14 0610 01/21/14 0613 01/22/14 0500  NA 144 144 143 142 141  K 4.1 4.0 3.6* 3.6* 3.8  CL 98 96 94* 95* 95*  CO2 35* 38* 41* 41* 40*  GLUCOSE 212* 179* 169* 114* 112*  BUN 22 24* 25* 24* 22  CREATININE 0.69 0.77 0.66 0.64 0.56  CALCIUM 9.6 10.1 10.2 10.0 9.5   Liver Function Tests:  Recent Labs Lab 01/18/14 0505  AST 9  ALT 7  ALKPHOS 85  BILITOT <0.2*  PROT 7.1  ALBUMIN 2.8*   No results found for this basename: LIPASE, AMYLASE,  in the last 168 hours No results found for this basename: AMMONIA,  in the last 168 hours CBC:  Recent Labs Lab 01/17/14 0859 01/18/14 0505 01/19/14 0404 01/20/14 0610 01/22/14 0500  WBC 6.5 6.5 11.1* 12.7* 11.5*  NEUTROABS 4.3  --   --   --   --   HGB 10.6* 11.2* 11.7* 11.9* 12.2  HCT 33.3* 35.3* 37.4 37.7 39.0  MCV 80.0 79.7 81.0 80.2 80.4  PLT 252 275 325 314 272   Cardiac  Enzymes:  Recent Labs Lab 01/17/14 0859  TROPONINI <0.30   BNP: BNP (last 3 results)  Recent Labs  12/18/13 0838 12/20/13 0615 01/17/14 0859  PROBNP 258.8* 1475.0* 1249.0*   CBG:  Recent Labs Lab 01/21/14 0738 01/21/14 1123 01/21/14 1700 01/21/14 2152 01/22/14 0815  GLUCAP 103* 127* 267* 268* 79       Signed:  Lemon Sternberg M  [facility] 01/22/2014, 11:03 AM

## 2014-01-26 ENCOUNTER — Ambulatory Visit (HOSPITAL_COMMUNITY)
Admission: RE | Admit: 2014-01-26 | Discharge: 2014-01-26 | Disposition: A | Discharge status: Home / Self Care | Payer: Medicaid Other | Source: Ambulatory Visit | Attending: Adult Health | Admitting: Adult Health

## 2014-01-26 ENCOUNTER — Encounter: Payer: Self-pay | Admitting: Adult Health

## 2014-01-26 ENCOUNTER — Ambulatory Visit (INDEPENDENT_AMBULATORY_CARE_PROVIDER_SITE_OTHER): Payer: Medicaid Other | Admitting: Adult Health

## 2014-01-26 VITALS — BP 139/73 | HR 89 | Ht 62.0 in | Wt 192.0 lb

## 2014-01-26 DIAGNOSIS — I5032 Chronic diastolic (congestive) heart failure: Secondary | ICD-10-CM

## 2014-01-26 DIAGNOSIS — F3289 Other specified depressive episodes: Secondary | ICD-10-CM

## 2014-01-26 DIAGNOSIS — F329 Major depressive disorder, single episode, unspecified: Secondary | ICD-10-CM

## 2014-01-26 DIAGNOSIS — I10 Essential (primary) hypertension: Secondary | ICD-10-CM

## 2014-01-26 DIAGNOSIS — I4891 Unspecified atrial fibrillation: Secondary | ICD-10-CM

## 2014-01-26 DIAGNOSIS — R0602 Shortness of breath: Secondary | ICD-10-CM

## 2014-01-26 DIAGNOSIS — M8448XA Pathological fracture, other site, initial encounter for fracture: Secondary | ICD-10-CM | POA: Insufficient documentation

## 2014-01-26 DIAGNOSIS — J449 Chronic obstructive pulmonary disease, unspecified: Secondary | ICD-10-CM

## 2014-01-26 NOTE — Assessment & Plan Note (Signed)
Heart rate is well-controlled currently she remains on Coumadin therapy. Continue current medical management.

## 2014-01-26 NOTE — Assessment & Plan Note (Signed)
Blood pressure is well-controlled currently. Despite her anxiety and depression there is no evidence of hypertension on this visit. No changes in her medication regimen.

## 2014-01-26 NOTE — Progress Notes (Signed)
HPI: [name] is a 58 year old medically complex patient of Dr. Domenic Perez were following post hospitalization where she was admitted for acute on chronic respiratory failure, with history of diastolic CHF, atrial fibrillation, type 2 diabetes, and COPD. Joanna Perez has been admitted to [facility] 3 times since being seen last by our office on 11/03/2013. She was admitted on 11/30/2013, 12/22/2013, and most recently 01/17/2014. Each incident was related to COPD exacerbation and respiratory failure.  The patient was treated on most recent hospitalization for community-acquired pneumonia, CHF with evidence of pulmonary edema with diuretics, and steroid treatment for COPD exacerbation. The patient's atrial fibrillation was found be paroxysmal and she remained in normal sinus rhythm on discharge, was continued on Cardizem and Apixaban for anticoagulation.  She comes today very depressed, tearful, anxious, with ongoing shortness of breath, oxygen dependent, with complaints of congestion. The patient states she is tired of feeling sick. She is also upset about family issues and social issues. She is medically compliant. She has stopped smoking. Does not feel much better. She continues to cough.   Allergies  Allergen Reactions  . Amoxicillin Hives  . Bactrim [Sulfamethoxazole-Tmp Ds] Hives  . Erythromycin Hives  . Keflex [Cephalexin] Hives  . Penicillins Itching and Swelling    Sweating    Current Outpatient Prescriptions  Medication Sig Dispense Refill  . albuterol (PROVENTIL HFA;VENTOLIN HFA) 108 (90 BASE) MCG/ACT inhaler Inhale 1 puff into the lungs every 4 (four) hours as needed for wheezing or shortness of breath.      Marland Kitchen albuterol (PROVENTIL) (2.5 MG/3ML) 0.083% nebulizer solution Take 3 mLs (2.5 mg total) by nebulization every 6 (six) hours as needed for wheezing or shortness of breath.  75 mL  12  . ALPRAZolam (XANAX) 1 MG tablet Take 0.5-1 mg by mouth 3 (three) times daily.       Marland Kitchen apixaban (ELIQUIS) 5 MG TABS tablet Take 1 tablet (5 mg total) by mouth 2 (two) times daily.  60 tablet  6  . Ascorbic Acid (VITAMIN C) 1000 MG tablet Take 1,000 mg by mouth daily.       . benzonatate (TESSALON PERLES) 100 MG capsule Take 1 capsule (100 mg total) by mouth 3 (three) times daily as needed for cough.  30 capsule  0  . bisacodyl (DULCOLAX) 5 MG EC tablet Take 5 mg by mouth daily as needed for moderate constipation.      . budesonide-formoterol (SYMBICORT) 160-4.5 MCG/ACT inhaler Inhale 2 puffs into the lungs.       . citalopram (CELEXA) 10 MG tablet Take 10 mg by mouth daily.        Marland Kitchen diltiazem (CARDIZEM CD) 240 MG 24 hr capsule Take 1 capsule (240 mg total) by mouth daily.  30 capsule  6  . doxycycline (VIBRAMYCIN) 50 MG capsule Take 2 capsules (100 mg total) by mouth 2 (two) times daily.  20 capsule  0  . fexofenadine (ALLEGRA) 180 MG tablet Take 180 mg by mouth 2 (two) times daily.      . fluconazole (DIFLUCAN) 150 MG tablet Take 1 tablet (150 mg total) by mouth every 3 (three) days as needed (vaginitis/yeast infection).  5 tablet  1  . fluticasone (FLONASE) 50 MCG/ACT nasal spray Place 1 spray into both nostrils.       . furosemide (LASIX) 20 MG tablet Take 2 tablets (40 mg total) by mouth daily.  30 tablet  0  . gabapentin (NEURONTIN) 100 MG capsule Take 100 mg  by mouth 3 (three) times daily.       Marland Kitchen guaiFENesin (MUCINEX) 600 MG 12 hr tablet Take 600 mg by mouth 2 (two) times daily as needed for cough.       Marland Kitchen HYDROcodone-acetaminophen (NORCO) 10-325 MG per tablet Take 1 tablet by mouth every 12 (twelve) hours as needed for severe pain.  30 tablet  0  . HYDROcodone-homatropine (HYCODAN) 5-1.5 MG/5ML syrup Take 5 mLs by mouth every 6 (six) hours as needed for cough.  120 mL  0  . levothyroxine (SYNTHROID, LEVOTHROID) 112 MCG tablet Take 112 mcg by mouth daily before breakfast.      . metFORMIN (GLUCOPHAGE) 500 MG tablet Take 1 tablet (500 mg total) by mouth 2 (two) times  daily with a meal.  60 tablet  3  . nystatin (MYCOSTATIN) 100000 UNIT/ML suspension Take 5 mLs (500,000 Units total) by mouth 4 (four) times daily.  240 mL  4  . pantoprazole (PROTONIX) 40 MG tablet Take 40 mg by mouth daily.       . predniSONE (DELTASONE) 10 MG tablet Take 4 tabs on 01/23/14 then take 3 tabs for 3 days then take 2 tabs for 3 days then take 1 tab for 3 days then stop.  30 tablet  0  . ramipril (ALTACE) 5 MG capsule Take 1 capsule (5 mg total) by mouth daily.  30 capsule  1  . simvastatin (ZOCOR) 40 MG tablet Take 40 mg by mouth daily at 6 PM.       . sodium chloride (OCEAN) 0.65 % SOLN nasal spray Place 1 spray into both nostrils as needed for congestion (also available OTC).  30 mL  2  . theophylline (THEODUR) 200 MG 12 hr tablet Take 400 mg by mouth 2 (two) times daily.      . vitamin E 1000 UNIT capsule Take 1,000 Units by mouth daily.       No current facility-administered medications for this visit.    Past Medical History  Diagnosis Date  . Asthma   . COPD (chronic obstructive pulmonary disease)   . Hypothyroidism   . Essential hypertension, benign   . Hyperlipidemia   . Type 2 diabetes mellitus   . Atrial fibrillation   . MI (myocardial infarction)     2012  . Pericardial effusion 09/09/2013  . CHF (congestive heart failure)   . On home O2     4L [place] continuous    Past Surgical History  Procedure Laterality Date  . Hernia repair    . Abdominal hysterectomy    . Stomach surgery      Wound vac currently in place  . Esophagogastroduodenoscopy  11/11/2011    Procedure: ESOPHAGOGASTRODUODENOSCOPY (EGD);  Surgeon: Rogene Houston, MD;  Location: [facility];  Service: Endoscopy;  Laterality: N/A;  . Foreign body removal  11/11/2011    Procedure: FOREIGN BODY REMOVAL;  Surgeon: Rogene Houston, MD;  Location: [facility];  Service: Endoscopy;  Laterality: N/A;  . Tracheal surgery    . Abdominal surgery      ROS: Review of systems complete and found to be  negative unless listed above  PHYSICAL EXAM BP 139/73  Pulse 89  Ht 5\' 2"  (1.575 m)  Wt 192 lb (87.091 kg)  BMI 35.11 kg/m2  General: Well developed, well nourished,  Head: Eyes PERRLA, No xanthomas.   Normal cephalic and atramatic  Lungs: Bilateral crackles, no wheezes. There is wearing O2 via nasal cannula. Heart: HRIR  S1 S2, without MRG.  Pulses are 2+ & equal.            No carotid bruit. No JVD.  No abdominal bruits. No femoral bruits. Abdomen: Bowel sounds are positive, abdomen soft and non-tender without masses or                  Hernia's noted. Msk:  Back normal, normal gait. Normal strength and tone for age. Extremities: No clubbing, cyanosis or edema.  DP +1 Neuro: Alert and oriented X 3. Psych:  Tearful, anxious, depressed.    ASSESSMENT AND PLAN

## 2014-01-26 NOTE — Progress Notes (Deleted)
Name: Joanna Perez    DOB: 09/02/1956  Age: 58 y.o.  MR#: [id number]       PCP:  Glo Herring., MD      Insurance: Payor: MEDICAID [place] / Plan: MEDICAID [place] ACCESS / Product Type: *No Product type* /   CC:    Chief Complaint  Patient presents with  . Hypertension  . Congestive Heart Failure    Diastolic    VS Filed Vitals:   01/26/14 1415  BP: 139/73  Pulse: 89  Height: 5\' 2"  (1.575 m)  Weight: 192 lb (87.091 kg)    Weights Current Weight  01/26/14 192 lb (87.091 kg)  01/22/14 195 lb 12.3 oz (88.8 kg)  12/22/13 191 lb 12.8 oz (87 kg)    Blood Pressure  BP Readings from Last 3 Encounters:  01/26/14 139/73  01/22/14 125/63  12/22/13 148/84     Admit date:  (Not on file) Last encounter with RMR:  11/03/2013   Allergy Amoxicillin; Bactrim; Erythromycin; Keflex; and Penicillins  Current Outpatient Prescriptions  Medication Sig Dispense Refill  . albuterol (PROVENTIL HFA;VENTOLIN HFA) 108 (90 BASE) MCG/ACT inhaler Inhale 1 puff into the lungs every 4 (four) hours as needed for wheezing or shortness of breath.      Marland Kitchen albuterol (PROVENTIL) (2.5 MG/3ML) 0.083% nebulizer solution Take 3 mLs (2.5 mg total) by nebulization every 6 (six) hours as needed for wheezing or shortness of breath.  75 mL  12  . ALPRAZolam (XANAX) 1 MG tablet Take 0.5-1 mg by mouth 3 (three) times daily.      Marland Kitchen apixaban (ELIQUIS) 5 MG TABS tablet Take 1 tablet (5 mg total) by mouth 2 (two) times daily.  60 tablet  6  . Ascorbic Acid (VITAMIN C) 1000 MG tablet Take 1,000 mg by mouth daily.       . benzonatate (TESSALON PERLES) 100 MG capsule Take 1 capsule (100 mg total) by mouth 3 (three) times daily as needed for cough.  30 capsule  0  . bisacodyl (DULCOLAX) 5 MG EC tablet Take 5 mg by mouth daily as needed for moderate constipation.      . budesonide-formoterol (SYMBICORT) 160-4.5 MCG/ACT inhaler Inhale 2 puffs into the lungs.       . citalopram (CELEXA) 10 MG tablet Take 10 mg by mouth daily.         Marland Kitchen diltiazem (CARDIZEM CD) 240 MG 24 hr capsule Take 1 capsule (240 mg total) by mouth daily.  30 capsule  6  . doxycycline (VIBRAMYCIN) 50 MG capsule Take 2 capsules (100 mg total) by mouth 2 (two) times daily.  20 capsule  0  . fexofenadine (ALLEGRA) 180 MG tablet Take 180 mg by mouth 2 (two) times daily.      . fluconazole (DIFLUCAN) 150 MG tablet Take 1 tablet (150 mg total) by mouth every 3 (three) days as needed (vaginitis/yeast infection).  5 tablet  1  . fluticasone (FLONASE) 50 MCG/ACT nasal spray Place 1 spray into both nostrils.       . furosemide (LASIX) 20 MG tablet Take 2 tablets (40 mg total) by mouth daily.  30 tablet  0  . gabapentin (NEURONTIN) 100 MG capsule Take 100 mg by mouth 3 (three) times daily.       Marland Kitchen guaiFENesin (MUCINEX) 600 MG 12 hr tablet Take 600 mg by mouth 2 (two) times daily as needed for cough.       Marland Kitchen HYDROcodone-acetaminophen (NORCO) 10-325 MG per tablet Take 1 tablet  by mouth every 12 (twelve) hours as needed for severe pain.  30 tablet  0  . HYDROcodone-homatropine (HYCODAN) 5-1.5 MG/5ML syrup Take 5 mLs by mouth every 6 (six) hours as needed for cough.  120 mL  0  . levothyroxine (SYNTHROID, LEVOTHROID) 112 MCG tablet Take 112 mcg by mouth daily before breakfast.      . metFORMIN (GLUCOPHAGE) 500 MG tablet Take 1 tablet (500 mg total) by mouth 2 (two) times daily with a meal.  60 tablet  3  . nystatin (MYCOSTATIN) 100000 UNIT/ML suspension Take 5 mLs (500,000 Units total) by mouth 4 (four) times daily.  240 mL  4  . pantoprazole (PROTONIX) 40 MG tablet Take 40 mg by mouth daily.       . predniSONE (DELTASONE) 10 MG tablet Take 4 tabs on 01/23/14 then take 3 tabs for 3 days then take 2 tabs for 3 days then take 1 tab for 3 days then stop.  30 tablet  0  . ramipril (ALTACE) 5 MG capsule Take 1 capsule (5 mg total) by mouth daily.  30 capsule  1  . simvastatin (ZOCOR) 40 MG tablet Take 40 mg by mouth daily at 6 PM.       . sodium chloride (OCEAN) 0.65 % SOLN  nasal spray Place 1 spray into both nostrils as needed for congestion (also available OTC).  30 mL  2  . theophylline (THEODUR) 200 MG 12 hr tablet Take 400 mg by mouth 2 (two) times daily.      . vitamin E 1000 UNIT capsule Take 1,000 Units by mouth daily.       No current facility-administered medications for this visit.    Discontinued Meds:   There are no discontinued medications.  Patient Active Problem List   Diagnosis Date Noted  . Dyspnea 01/17/2014  . Acute on chronic respiratory failure 01/17/2014  . Hypotension 01/17/2014  . COPD with exacerbation 12/18/2013  . COPD exacerbation 11/27/2013  . Acute respiratory failure 11/27/2013  . Chronic diastolic heart failure [id number]  . Pericardial effusion 09/09/2013  . Atrial fibrillation 09/08/2013  . Chronic respiratory failure 09/08/2013  . Cigarette smoker 09/08/2013  . HYPOTHYROIDISM 09/23/2010  . Type 2 diabetes mellitus 09/23/2010  . DEPRESSION 09/23/2010  . Essential hypertension, benign 09/23/2010  . ASTHMA 09/23/2010  . COPD 09/23/2010  . BACK PAIN, CHRONIC 09/23/2010    LABS    Component Value Date/Time   NA 141 01/22/2014 0500   NA 142 01/21/2014 0613   NA 143 01/20/2014 0610   K 3.8 01/22/2014 0500   K 3.6* 01/21/2014 0613   K 3.6* 01/20/2014 0610   CL 95* 01/22/2014 0500   CL 95* 01/21/2014 0613   CL 94* 01/20/2014 0610   CO2 40* 01/22/2014 0500   CO2 41* 01/21/2014 0613   CO2 41* 01/20/2014 0610   GLUCOSE 112* 01/22/2014 0500   GLUCOSE 114* 01/21/2014 0613   GLUCOSE 169* 01/20/2014 0610   BUN 22 01/22/2014 0500   BUN 24* 01/21/2014 0613   BUN 25* 01/20/2014 0610   CREATININE 0.56 01/22/2014 0500   CREATININE 0.64 01/21/2014 0613   CREATININE 0.66 01/20/2014 0610   CREATININE 0.74 10/31/2013 1055   CREATININE 0.64 09/29/2013 1600   CALCIUM 9.5 01/22/2014 0500   CALCIUM 10.0 01/21/2014 0613   CALCIUM 10.2 01/20/2014 0610   GFRNONAA >90 01/22/2014 0500   GFRNONAA >90 01/21/2014 0613   GFRNONAA >90 01/20/2014 0610   GFRAA >90 01/22/2014  0500  GFRAA >90 01/21/2014 0613   GFRAA >90 01/20/2014 0610   CMP     Component Value Date/Time   NA 141 01/22/2014 0500   K 3.8 01/22/2014 0500   CL 95* 01/22/2014 0500   CO2 40* 01/22/2014 0500   GLUCOSE 112* 01/22/2014 0500   BUN 22 01/22/2014 0500   CREATININE 0.56 01/22/2014 0500   CREATININE 0.74 10/31/2013 1055   CALCIUM 9.5 01/22/2014 0500   PROT 7.1 01/18/2014 0505   ALBUMIN 2.8* 01/18/2014 0505   AST 9 01/18/2014 0505   ALT 7 01/18/2014 0505   ALKPHOS 85 01/18/2014 0505   BILITOT <0.2* 01/18/2014 0505   GFRNONAA >90 01/22/2014 0500   GFRAA >90 01/22/2014 0500       Component Value Date/Time   WBC 11.5* 01/22/2014 0500   WBC 12.7* 01/20/2014 0610   WBC 11.1* 01/19/2014 0404   HGB 12.2 01/22/2014 0500   HGB 11.9* 01/20/2014 0610   HGB 11.7* 01/19/2014 0404   HCT 39.0 01/22/2014 0500   HCT 37.7 01/20/2014 0610   HCT 37.4 01/19/2014 0404   MCV 80.4 01/22/2014 0500   MCV 80.2 01/20/2014 0610   MCV 81.0 01/19/2014 0404    Lipid Panel  No results found for this basename: chol, trig, hdl, cholhdl, vldl, ldlcalc    ABG    Component Value Date/Time   PHART 7.365 12/18/2013 1210   PCO2ART 55.6* 12/18/2013 1210   PO2ART 69.0* 12/18/2013 1210   HCO3 31.0* 12/18/2013 1210   TCO2 28.0 12/18/2013 1210   O2SAT 90.5 12/18/2013 1210     Lab Results  Component Value Date   TSH 2.803 11/27/2013   BNP (last 3 results)  Recent Labs  12/18/13 0838 12/20/13 0615 01/17/14 0859  PROBNP 258.8* 1475.0* 1249.0*   Cardiac Panel (last 3 results) No results found for this basename: CKTOTAL, CKMB, TROPONINI, RELINDX,  in the last 72 hours  Iron/TIBC/Ferritin No results found for this basename: iron, tibc, ferritin     EKG Orders placed during the hospital encounter of 01/17/14  . EKG 12-LEAD  . EKG 12-LEAD  . ED EKG  . ED EKG  . EKG     Prior Assessment and Plan Problem List as of 01/26/2014     Cardiovascular and Mediastinum   Essential hypertension, benign   Last Assessment & Plan   11/03/2013 Office  Visit Written 11/03/2013  3:10 PM by Lendon Colonel, NP     Well controlled currently. Will continue current medication regimen. It has been reviewed and updated concerning meds and dosages, as there were duplicates from [place] and our office.     Atrial fibrillation   Last Assessment & Plan   10/27/2013 Office Visit Written 10/27/2013  3:44 PM by Satira Sark, MD     Paroxysmal by history. Plan is to continue Eliquis and Cardizem CD for now.    Pericardial effusion   Last Assessment & Plan   10/27/2013 Office Visit Written 10/27/2013  3:47 PM by Satira Sark, MD     Trivial by followup echocardiogram in October. May need limited echocardiogram if no clinical improvement next visit on intensified diuretic.    Chronic diastolic heart failure   Last Assessment & Plan   11/03/2013 Office Visit Written 11/03/2013  3:14 PM by Lendon Colonel, NP     She comes today having gained more weight but has no evidence of significant LEE. She is taking lasix 40 mg BID now. I suspect the steroids are influencing  weight gain. CXR is being completed today.    Hypotension     Respiratory   ASTHMA   COPD   Last Assessment & Plan   11/03/2013 Office Visit Written 11/03/2013  3:12 PM by Lendon Colonel, NP     She is continuing to wheeze and cough with rales and rhonchi. I will check CXR and have copy sent to Dr. Gerarda Fraction and Dr. Rinaldo Cloud at [facility]. She is advised to see Dr. Gerarda Fraction if symptoms persist, as she thinks she is getting a cold. She unfortunately continues to smoke. She has appt with pulmonology in [facility]     Chronic respiratory failure   Last Assessment & Plan   10/27/2013 Office Visit Written 10/27/2013  3:48 PM by Satira Sark, MD     History of significant COPD. Currently pulmonologist is in [place]. I offered to try and assist with a referral to a closer pulmonary specialists, she will consider this and speak with her daughter-in-law.    COPD exacerbation   Acute  respiratory failure   COPD with exacerbation   Acute on chronic respiratory failure     Endocrine   HYPOTHYROIDISM   Type 2 diabetes mellitus     Other   DEPRESSION   BACK PAIN, CHRONIC   Cigarette smoker   Last Assessment & Plan   10/27/2013 Office Visit Written 10/27/2013  3:47 PM by Satira Sark, MD     Smoking cessation has been recommended, she has not been able to quit over time.    Dyspnea       Imaging: Dg Chest Port 1 View  01/22/2014   CLINICAL DATA:  Pneumonia.  EXAM: PORTABLE CHEST - 1 VIEW  COMPARISON:  01/19/2014  FINDINGS: Patchy right upper lobe airspace opacity has improved since prior study. Heart is borderline in size. Mild interstitial prominence throughout the lungs, stable with mild peribronchial thickening. No effusions. No acute bony abnormality.  IMPRESSION: Improving patchy right upper lobe infiltrate.  Otherwise no change.   Electronically Signed   By: Rolm Baptise M.D.   On: 01/22/2014 08:15   Dg Chest Port 1 View  01/19/2014   CLINICAL DATA:  Shortness of breath  EXAM: PORTABLE CHEST - 1 VIEW  COMPARISON:  DG CHEST 1V PORT dated 01/17/2014; DG CHEST 1V PORT dated 12/20/2013; DG CHEST 1V PORT dated 12/18/2013  FINDINGS: Examination is again degraded due to patient body habitus and portable technique.  Grossly unchanged enlarged cardiac silhouette and mediastinal contours with atherosclerotic plaque within the thoracic aorta. Interval repositioning of right upper extremity approach PICC line with tip now projected over the superior cavoatrial junction grossly unchanged diffuse nodular thickening of the pulmonary interstitium with worsening heterogeneous airspace opacities within the right upper lung. No definite pleural effusion or pneumothorax. Grossly unchanged bones.  IMPRESSION: 1. Findings suggestive of a worsening right upper lobe pneumonia (including atypical etiologies) superimposed on airways disease. 2. Repositioning of right upper extremity approach PICC  line with tip now projected over the superior cavoatrial junction.   Electronically Signed   By: Sandi Mariscal M.D.   On: 01/19/2014 07:42   Dg Chest Port 1 View  01/17/2014   CLINICAL DATA:  Status post PICC line placement  EXAM: PORTABLE CHEST - 1 VIEW  COMPARISON:  DG CHEST 1V PORT dated 01/17/2014  FINDINGS: The lungs are well-expanded. There is no evidence of a pneumothorax or pneumothorax. The PICC line tip lies in the region of the junction of the proximal and midportions  of the SVC. The pulmonary interstitial markings remain increased. The cardiac silhouette is normal in size. The pulmonary vascularity is mildly prominent centrally.  IMPRESSION: 1. There is no evidence of postprocedure complication following placement of a right-sided PICC line. 2. There remain increased interstitial markings diffusely within the lungs. This may reflect a low-grade CHF superimposed upon underlying COPD.   Electronically Signed   By: David  Martinique   On: 01/17/2014 15:21   Dg Chest Port 1 View  01/17/2014   CLINICAL DATA:  Short of breath  EXAM: PORTABLE CHEST - 1 VIEW  COMPARISON:  DG CHEST 1V PORT dated 12/20/2013  FINDINGS: Low volumes. Vascular congestion. No consolidation. No pneumothorax. Basilar hypoaeration.  IMPRESSION: Vascular congestion and basilar hypoaeration.   Electronically Signed   By: Maryclare Bean M.D.   On: 01/17/2014 09:02

## 2014-01-26 NOTE — Assessment & Plan Note (Signed)
I discussed with her the need to followup with her primary care physician with possible psychiatric referral. She denies any plans to harm herself or others. She continues to be very upset and tearful concerning her social issues and overall health status.

## 2014-01-26 NOTE — Assessment & Plan Note (Signed)
She continues cough and congestion status post treatment for pneumonia. This may be residual from infection vs. ongoing pneumonia. Will repeat chest x-ray PA and lateral for further evaluation with copies to Dr. Luan Pulling.

## 2014-01-26 NOTE — Patient Instructions (Signed)
Your physician recommends that you schedule a follow-up appointment in: 3 Months with Dr Domenic Polite  A chest x-ray takes a picture of the organs and structures inside the chest, including the heart, lungs, and blood vessels. This test can show several things, including, whether the heart is enlarges; whether fluid is building up in the lungs; and whether pacemaker / defibrillator leads are still in place.

## 2014-01-26 NOTE — Assessment & Plan Note (Signed)
No evidence of fluid overload, CHF on exam. She continues on Lasix 40 mg daily. She will followup in our office in 3 months for ongoing management.

## 2014-02-05 ENCOUNTER — Ambulatory Visit: Payer: Medicaid Other | Admitting: Cardiology

## 2014-02-22 ENCOUNTER — Other Ambulatory Visit (HOSPITAL_COMMUNITY): Payer: Self-pay | Admitting: Internal Medicine

## 2014-02-22 ENCOUNTER — Ambulatory Visit (HOSPITAL_COMMUNITY)
Admission: RE | Admit: 2014-02-22 | Discharge: 2014-02-22 | Disposition: A | Discharge status: Home / Self Care | Payer: Medicaid Other | Source: Ambulatory Visit | Attending: Internal Medicine | Admitting: Internal Medicine

## 2014-02-22 DIAGNOSIS — M47817 Spondylosis without myelopathy or radiculopathy, lumbosacral region: Secondary | ICD-10-CM | POA: Insufficient documentation

## 2014-02-22 DIAGNOSIS — M5137 Other intervertebral disc degeneration, lumbosacral region: Secondary | ICD-10-CM | POA: Insufficient documentation

## 2014-02-22 DIAGNOSIS — M545 Low back pain, unspecified: Secondary | ICD-10-CM | POA: Insufficient documentation

## 2014-02-22 DIAGNOSIS — M549 Dorsalgia, unspecified: Secondary | ICD-10-CM

## 2014-02-22 DIAGNOSIS — M51379 Other intervertebral disc degeneration, lumbosacral region without mention of lumbar back pain or lower extremity pain: Secondary | ICD-10-CM | POA: Insufficient documentation

## 2014-02-28 ENCOUNTER — Other Ambulatory Visit: Payer: Self-pay

## 2014-02-28 ENCOUNTER — Encounter (HOSPITAL_COMMUNITY): Payer: Self-pay | Admitting: Emergency Medicine

## 2014-02-28 ENCOUNTER — Inpatient Hospital Stay (HOSPITAL_COMMUNITY)
Admission: EM | Admit: 2014-02-28 | Discharge: 2014-03-04 | DRG: 190 | Disposition: A | Discharge status: Home Health | Payer: Medicaid Other | Attending: Internal Medicine | Admitting: Internal Medicine

## 2014-02-28 ENCOUNTER — Emergency Department (HOSPITAL_COMMUNITY): Payer: Medicaid Other

## 2014-02-28 DIAGNOSIS — I5033 Acute on chronic diastolic (congestive) heart failure: Secondary | ICD-10-CM | POA: Diagnosis present

## 2014-02-28 DIAGNOSIS — N179 Acute kidney failure, unspecified: Secondary | ICD-10-CM | POA: Diagnosis present

## 2014-02-28 DIAGNOSIS — I5032 Chronic diastolic (congestive) heart failure: Secondary | ICD-10-CM

## 2014-02-28 DIAGNOSIS — J449 Chronic obstructive pulmonary disease, unspecified: Secondary | ICD-10-CM

## 2014-02-28 DIAGNOSIS — Z9981 Dependence on supplemental oxygen: Secondary | ICD-10-CM

## 2014-02-28 DIAGNOSIS — I252 Old myocardial infarction: Secondary | ICD-10-CM

## 2014-02-28 DIAGNOSIS — J9620 Acute and chronic respiratory failure, unspecified whether with hypoxia or hypercapnia: Secondary | ICD-10-CM | POA: Diagnosis present

## 2014-02-28 DIAGNOSIS — J45901 Unspecified asthma with (acute) exacerbation: Principal | ICD-10-CM

## 2014-02-28 DIAGNOSIS — E785 Hyperlipidemia, unspecified: Secondary | ICD-10-CM | POA: Diagnosis present

## 2014-02-28 DIAGNOSIS — J441 Chronic obstructive pulmonary disease with (acute) exacerbation: Principal | ICD-10-CM | POA: Diagnosis present

## 2014-02-28 DIAGNOSIS — I509 Heart failure, unspecified: Secondary | ICD-10-CM | POA: Diagnosis present

## 2014-02-28 DIAGNOSIS — Z87891 Personal history of nicotine dependence: Secondary | ICD-10-CM

## 2014-02-28 DIAGNOSIS — I4891 Unspecified atrial fibrillation: Secondary | ICD-10-CM | POA: Diagnosis present

## 2014-02-28 DIAGNOSIS — Z833 Family history of diabetes mellitus: Secondary | ICD-10-CM

## 2014-02-28 DIAGNOSIS — E119 Type 2 diabetes mellitus without complications: Secondary | ICD-10-CM

## 2014-02-28 DIAGNOSIS — Z79899 Other long term (current) drug therapy: Secondary | ICD-10-CM

## 2014-02-28 DIAGNOSIS — J9600 Acute respiratory failure, unspecified whether with hypoxia or hypercapnia: Secondary | ICD-10-CM

## 2014-02-28 DIAGNOSIS — I10 Essential (primary) hypertension: Secondary | ICD-10-CM | POA: Diagnosis present

## 2014-02-28 DIAGNOSIS — E039 Hypothyroidism, unspecified: Secondary | ICD-10-CM | POA: Diagnosis present

## 2014-02-28 LAB — COMPREHENSIVE METABOLIC PANEL
ALT: 6 U/L (ref 0–35)
AST: 11 U/L (ref 0–37)
Albumin: 2.7 g/dL — ABNORMAL LOW (ref 3.5–5.2)
Alkaline Phosphatase: 62 U/L (ref 39–117)
BUN: 24 mg/dL — AB (ref 6–23)
CALCIUM: 9.4 mg/dL (ref 8.4–10.5)
CO2: 31 meq/L (ref 19–32)
CREATININE: 1.41 mg/dL — AB (ref 0.50–1.10)
Chloride: 98 mEq/L (ref 96–112)
GFR, EST AFRICAN AMERICAN: 47 mL/min — AB (ref >90)
GFR, EST NON AFRICAN AMERICAN: 40 mL/min — AB (ref >90)
GLUCOSE: 136 mg/dL — AB (ref 70–99)
Potassium: 4.4 mEq/L (ref 3.7–5.3)
Sodium: 141 mEq/L (ref 137–147)
Total Bilirubin: 0.4 mg/dL (ref 0.3–1.2)
Total Protein: 6.5 g/dL (ref 6.0–8.3)

## 2014-02-28 LAB — CBC WITH DIFFERENTIAL/PLATELET
Basophils Absolute: 0 10*3/uL (ref 0.0–0.1)
Basophils Relative: 0 % (ref 0–1)
EOS PCT: 1 % (ref 0–5)
Eosinophils Absolute: 0.1 10*3/uL (ref 0.0–0.7)
HEMATOCRIT: 30.2 % — AB (ref 36.0–46.0)
Hemoglobin: 9.3 g/dL — ABNORMAL LOW (ref 12.0–15.0)
LYMPHS ABS: 2.4 10*3/uL (ref 0.7–4.0)
Lymphocytes Relative: 22 % (ref 12–46)
MCH: 24.3 pg — ABNORMAL LOW (ref 26.0–34.0)
MCHC: 30.8 g/dL (ref 30.0–36.0)
MCV: 79.1 fL (ref 78.0–100.0)
MONOS PCT: 7 % (ref 3–12)
Monocytes Absolute: 0.8 10*3/uL (ref 0.1–1.0)
Neutro Abs: 7.5 10*3/uL (ref 1.7–7.7)
Neutrophils Relative %: 70 % (ref 43–77)
Platelets: 285 10*3/uL (ref 150–400)
RBC: 3.82 MIL/uL — AB (ref 3.87–5.11)
RDW: 18.2 % — AB (ref 11.5–15.5)
WBC: 10.8 10*3/uL — ABNORMAL HIGH (ref 4.0–10.5)

## 2014-02-28 LAB — TROPONIN I

## 2014-02-28 LAB — PRO B NATRIURETIC PEPTIDE: Pro B Natriuretic peptide (BNP): 784 pg/mL — ABNORMAL HIGH (ref 0–125)

## 2014-02-28 MED ORDER — FUROSEMIDE 40 MG PO TABS
40.0000 mg | ORAL_TABLET | Freq: Every day | ORAL | Status: DC
  Administered 2014-03-01 – 2014-03-04 (×4): 40 mg via ORAL
  Filled 2014-02-28 (×4): qty 1

## 2014-02-28 MED ORDER — GABAPENTIN 100 MG PO CAPS
100.0000 mg | ORAL_CAPSULE | Freq: Three times a day (TID) | ORAL | Status: DC
  Administered 2014-02-28 – 2014-03-04 (×12): 100 mg via ORAL
  Filled 2014-02-28 (×12): qty 1

## 2014-02-28 MED ORDER — BUDESONIDE-FORMOTEROL FUMARATE 160-4.5 MCG/ACT IN AERO
2.0000 | INHALATION_SPRAY | Freq: Two times a day (BID) | RESPIRATORY_TRACT | Status: DC
  Administered 2014-02-28 – 2014-03-04 (×8): 2 via RESPIRATORY_TRACT
  Filled 2014-02-28: qty 6

## 2014-02-28 MED ORDER — IPRATROPIUM-ALBUTEROL 0.5-2.5 (3) MG/3ML IN SOLN
3.0000 mL | Freq: Once | RESPIRATORY_TRACT | Status: AC
  Administered 2014-02-28: 3 mL via RESPIRATORY_TRACT
  Filled 2014-02-28: qty 3

## 2014-02-28 MED ORDER — INSULIN ASPART 100 UNIT/ML ~~LOC~~ SOLN
0.0000 [IU] | Freq: Three times a day (TID) | SUBCUTANEOUS | Status: DC
  Administered 2014-03-01: 3 [IU] via SUBCUTANEOUS
  Administered 2014-03-01 (×2): 5 [IU] via SUBCUTANEOUS
  Administered 2014-03-02: 3 [IU] via SUBCUTANEOUS
  Administered 2014-03-02 – 2014-03-03 (×3): 5 [IU] via SUBCUTANEOUS
  Administered 2014-03-03: 3 [IU] via SUBCUTANEOUS
  Administered 2014-03-03: 5 [IU] via SUBCUTANEOUS
  Administered 2014-03-04: 2 [IU] via SUBCUTANEOUS
  Administered 2014-03-04: 3 [IU] via SUBCUTANEOUS
  Administered 2014-03-04: 5 [IU] via SUBCUTANEOUS

## 2014-02-28 MED ORDER — DILTIAZEM HCL ER COATED BEADS 240 MG PO CP24
240.0000 mg | ORAL_CAPSULE | Freq: Every day | ORAL | Status: DC
  Administered 2014-03-01 – 2014-03-04 (×4): 240 mg via ORAL
  Filled 2014-02-28 (×4): qty 1

## 2014-02-28 MED ORDER — SIMVASTATIN 20 MG PO TABS: 40.0000 mg | ORAL_TABLET | Freq: Every day | ORAL | Status: DC

## 2014-02-28 MED ORDER — ATORVASTATIN CALCIUM 20 MG PO TABS
20.0000 mg | ORAL_TABLET | Freq: Every day | ORAL | Status: DC
  Administered 2014-02-28 – 2014-03-04 (×5): 20 mg via ORAL
  Filled 2014-02-28 (×5): qty 1

## 2014-02-28 MED ORDER — PANTOPRAZOLE SODIUM 40 MG PO TBEC
40.0000 mg | DELAYED_RELEASE_TABLET | Freq: Every day | ORAL | Status: DC
  Administered 2014-03-01 – 2014-03-04 (×4): 40 mg via ORAL
  Filled 2014-02-28 (×4): qty 1

## 2014-02-28 MED ORDER — SALINE SPRAY 0.65 % NA SOLN
1.0000 | NASAL | Status: DC | PRN
  Filled 2014-02-28: qty 44

## 2014-02-28 MED ORDER — LEVOTHYROXINE SODIUM 112 MCG PO TABS
112.0000 ug | ORAL_TABLET | Freq: Every day | ORAL | Status: DC
  Administered 2014-03-01 – 2014-03-04 (×4): 112 ug via ORAL
  Filled 2014-02-28 (×6): qty 1

## 2014-02-28 MED ORDER — ALBUTEROL SULFATE (2.5 MG/3ML) 0.083% IN NEBU
2.5000 mg | INHALATION_SOLUTION | Freq: Once | RESPIRATORY_TRACT | Status: AC
Start: 2014-02-28 — End: 2014-02-28
  Administered 2014-02-28: 2.5 mg via RESPIRATORY_TRACT
  Filled 2014-02-28: qty 3

## 2014-02-28 MED ORDER — METHYLPREDNISOLONE SODIUM SUCC 125 MG IJ SOLR
60.0000 mg | Freq: Four times a day (QID) | INTRAMUSCULAR | Status: DC
  Administered 2014-02-28 – 2014-03-03 (×11): 60 mg via INTRAVENOUS
  Filled 2014-02-28 (×11): qty 2

## 2014-02-28 MED ORDER — FUROSEMIDE 10 MG/ML IJ SOLN
40.0000 mg | Freq: Once | INTRAMUSCULAR | Status: AC
  Administered 2014-02-28: 40 mg via INTRAVENOUS
  Filled 2014-02-28: qty 4

## 2014-02-28 MED ORDER — BISACODYL 5 MG PO TBEC: 5.0000 mg | DELAYED_RELEASE_TABLET | Freq: Every day | ORAL | Status: DC | PRN

## 2014-02-28 MED ORDER — METHYLPREDNISOLONE SODIUM SUCC 125 MG IJ SOLR
125.0000 mg | Freq: Once | INTRAMUSCULAR | Status: AC
  Administered 2014-02-28: 125 mg via INTRAVENOUS
  Filled 2014-02-28: qty 2

## 2014-02-28 MED ORDER — ALBUTEROL SULFATE (2.5 MG/3ML) 0.083% IN NEBU
2.5000 mg | INHALATION_SOLUTION | Freq: Four times a day (QID) | RESPIRATORY_TRACT | Status: DC | PRN
Start: 2014-02-28 — End: 2014-02-28

## 2014-02-28 MED ORDER — ONDANSETRON HCL 4 MG/2ML IJ SOLN: 4.0000 mg | Freq: Four times a day (QID) | INTRAMUSCULAR | Status: DC | PRN

## 2014-02-28 MED ORDER — HYDROCODONE-ACETAMINOPHEN 10-325 MG PO TABS
1.0000 | ORAL_TABLET | Freq: Two times a day (BID) | ORAL | Status: DC | PRN
  Administered 2014-02-28 – 2014-03-04 (×3): 1 via ORAL
  Filled 2014-02-28 (×6): qty 1

## 2014-02-28 MED ORDER — ALBUTEROL SULFATE HFA 108 (90 BASE) MCG/ACT IN AERS: 1.0000 | INHALATION_SPRAY | RESPIRATORY_TRACT | Status: DC | PRN

## 2014-02-28 MED ORDER — GUAIFENESIN ER 600 MG PO TB12
600.0000 mg | ORAL_TABLET | Freq: Two times a day (BID) | ORAL | Status: DC | PRN
  Administered 2014-03-01 – 2014-03-02 (×2): 600 mg via ORAL
  Filled 2014-02-28 (×2): qty 1

## 2014-02-28 MED ORDER — LORATADINE 10 MG PO TABS
10.0000 mg | ORAL_TABLET | Freq: Every day | ORAL | Status: DC
  Administered 2014-03-01 – 2014-03-04 (×4): 10 mg via ORAL
  Filled 2014-02-28 (×4): qty 1

## 2014-02-28 MED ORDER — BUDESONIDE-FORMOTEROL FUMARATE 160-4.5 MCG/ACT IN AERO
INHALATION_SPRAY | RESPIRATORY_TRACT | Status: AC
  Filled 2014-02-28: qty 6

## 2014-02-28 MED ORDER — THEOPHYLLINE ER 200 MG PO TB12
400.0000 mg | ORAL_TABLET | Freq: Two times a day (BID) | ORAL | Status: DC
  Administered 2014-02-28 – 2014-03-04 (×8): 400 mg via ORAL
  Filled 2014-02-28 (×8): qty 2

## 2014-02-28 MED ORDER — HYDROMORPHONE HCL PF 1 MG/ML IJ SOLN: 1.0000 mg | INTRAMUSCULAR | Status: DC | PRN

## 2014-02-28 MED ORDER — FLUTICASONE PROPIONATE 50 MCG/ACT NA SUSP
1.0000 | Freq: Every day | NASAL | Status: DC
  Administered 2014-03-01 – 2014-03-04 (×4): 1 via NASAL
  Filled 2014-02-28: qty 16

## 2014-02-28 MED ORDER — ALPRAZOLAM 0.5 MG PO TABS
0.5000 mg | ORAL_TABLET | Freq: Three times a day (TID) | ORAL | Status: DC
  Administered 2014-02-28 – 2014-03-02 (×5): 1 mg via ORAL
  Administered 2014-03-02: 0.5 mg via ORAL
  Administered 2014-03-02 – 2014-03-04 (×6): 1 mg via ORAL
  Filled 2014-02-28 (×12): qty 2

## 2014-02-28 MED ORDER — ALBUTEROL SULFATE (2.5 MG/3ML) 0.083% IN NEBU
2.5000 mg | INHALATION_SOLUTION | Freq: Four times a day (QID) | RESPIRATORY_TRACT | Status: DC
  Administered 2014-02-28 – 2014-03-04 (×16): 2.5 mg via RESPIRATORY_TRACT
  Filled 2014-02-28 (×16): qty 3

## 2014-02-28 MED ORDER — ALBUTEROL SULFATE (2.5 MG/3ML) 0.083% IN NEBU: 2.5000 mg | INHALATION_SOLUTION | RESPIRATORY_TRACT | Status: DC | PRN

## 2014-02-28 MED ORDER — ONDANSETRON HCL 4 MG PO TABS: 4.0000 mg | ORAL_TABLET | Freq: Four times a day (QID) | ORAL | Status: DC | PRN

## 2014-02-28 MED ORDER — APIXABAN 5 MG PO TABS
5.0000 mg | ORAL_TABLET | Freq: Two times a day (BID) | ORAL | Status: DC
Start: 2014-02-28 — End: 2014-03-04
  Administered 2014-02-28 – 2014-03-04 (×8): 5 mg via ORAL
  Filled 2014-02-28 (×8): qty 1

## 2014-02-28 MED ORDER — CITALOPRAM HYDROBROMIDE 20 MG PO TABS
20.0000 mg | ORAL_TABLET | Freq: Every day | ORAL | Status: DC
  Administered 2014-03-01 – 2014-03-04 (×4): 20 mg via ORAL
  Filled 2014-02-28 (×4): qty 1

## 2014-02-28 MED ORDER — ENOXAPARIN SODIUM 40 MG/0.4ML ~~LOC~~ SOLN: 40.0000 mg | SUBCUTANEOUS | Status: DC

## 2014-02-28 NOTE — ED Notes (Signed)
Patient assist to restroom, walk well o2 drop to 78% on return to room. After resting up to 92%.

## 2014-02-28 NOTE — ED Notes (Signed)
SOB x 1 week. Pt is on O2 at 4 L at all times.

## 2014-02-28 NOTE — ED Provider Notes (Signed)
CSN: [id number]     Arrival date & time 02/28/14  1746 History   First MD Initiated Contact with Patient 02/28/14 1811    This chart was scribed for Maudry Diego, MD by Terressa Koyanagi, ED Scribe. This patient was seen in room APA16A/APA16A and the patient's care was started at 6:16 PM.  PCP: Glo Herring., MD   Chief Complaint  Patient presents with  . Shortness of Breath   Patient is a 58 y.o. female presenting with shortness of breath. The history is provided by the patient. No language interpreter was used.  Shortness of Breath Onset quality:  Unable to specify Duration:  1 week Timing:  Intermittent Worsened by:  Activity, exertion and movement Associated symptoms: abdominal pain and wheezing   Associated symptoms: no chest pain, no cough, no headaches and no rash    HPI Comments: KEMIAH BOOZ is a 58 y.o. female, with a history of COPD, DM, HLD, essential hypertension (benign), MI (2012), and CHF, who presents to the Emergency Department complaining of SOB onset one week ago. Pt complains of associated generalized pain, aches, and inability to sleep well. Pt reports she is on constant O2 at 4L at home.   Past Medical History  Diagnosis Date  . Asthma   . COPD (chronic obstructive pulmonary disease)   . Hypothyroidism   . Essential hypertension, benign   . Hyperlipidemia   . Type 2 diabetes mellitus   . Atrial fibrillation   . MI (myocardial infarction)     2012  . Pericardial effusion 09/09/2013  . CHF (congestive heart failure)   . On home O2     4L N/C continuous   Past Surgical History  Procedure Laterality Date  . Hernia repair    . Abdominal hysterectomy    . Stomach surgery      Wound vac currently in place  . Esophagogastroduodenoscopy  11/11/2011    Procedure: ESOPHAGOGASTRODUODENOSCOPY (EGD);  Surgeon: Rogene Houston, MD;  Location: [facility];  Service: Endoscopy;  Laterality: N/A;  . Foreign body removal  11/11/2011    Procedure: FOREIGN  BODY REMOVAL;  Surgeon: Rogene Houston, MD;  Location: [facility];  Service: Endoscopy;  Laterality: N/A;  . Tracheal surgery    . Abdominal surgery     Family History  Problem Relation Age of Onset  . Diabetes Mother    History  Substance Use Topics  . Smoking status: Former Smoker -- 1.50 packs/day for 35 years    Types: Cigarettes    Quit date: 11/27/2013  . Smokeless tobacco: Never Used  . Alcohol Use: Yes     Comment: 1-2 beers nightly   OB History   Grav Para Term Preterm Abortions TAB SAB Ect Mult Living   2 2 2       1      Review of Systems  Constitutional: Negative for appetite change and fatigue.  HENT: Negative for congestion, ear discharge and sinus pressure.   Eyes: Negative for discharge.  Respiratory: Positive for shortness of breath and wheezing. Negative for cough.   Cardiovascular: Negative for chest pain.  Gastrointestinal: Positive for abdominal pain. Negative for diarrhea.  Genitourinary: Negative for frequency and hematuria.  Musculoskeletal: Positive for myalgias. Negative for back pain.  Skin: Negative for rash.  Neurological: Negative for seizures and headaches.  Psychiatric/Behavioral: Positive for sleep disturbance. Negative for hallucinations.      Allergies  Amoxicillin; Bactrim; Erythromycin; Keflex; and Penicillins  Home Medications  Current Outpatient Rx  Name  Route  Sig  Dispense  Refill  . albuterol (PROVENTIL HFA;VENTOLIN HFA) 108 (90 BASE) MCG/ACT inhaler   Inhalation   Inhale 1 puff into the lungs every 4 (four) hours as needed for wheezing or shortness of breath.         Marland Kitchen albuterol (PROVENTIL) (2.5 MG/3ML) 0.083% nebulizer solution   Nebulization   Take 3 mLs (2.5 mg total) by nebulization every 6 (six) hours as needed for wheezing or shortness of breath.   75 mL   12   . ALPRAZolam (XANAX) 1 MG tablet   Oral   Take 0.5-1 mg by mouth 3 (three) times daily.         Marland Kitchen apixaban (ELIQUIS) 5 MG TABS tablet    Oral   Take 1 tablet (5 mg total) by mouth 2 (two) times daily.   60 tablet   6   . Ascorbic Acid (VITAMIN C) 1000 MG tablet   Oral   Take 1,000 mg by mouth daily.          . bisacodyl (DULCOLAX) 5 MG EC tablet   Oral   Take 5 mg by mouth daily as needed for moderate constipation.         . budesonide-formoterol (SYMBICORT) 160-4.5 MCG/ACT inhaler   Inhalation   Inhale 2 puffs into the lungs.          . citalopram (CELEXA) 20 MG tablet   Oral   Take 20 mg by mouth daily.         Marland Kitchen diltiazem (CARDIZEM CD) 240 MG 24 hr capsule   Oral   Take 1 capsule (240 mg total) by mouth daily.   30 capsule   6   . fexofenadine (ALLEGRA) 180 MG tablet   Oral   Take 180 mg by mouth 2 (two) times daily.         . fluticasone (FLONASE) 50 MCG/ACT nasal spray   Each Nare   Place 1 spray into both nostrils.          . furosemide (LASIX) 20 MG tablet   Oral   Take 2 tablets (40 mg total) by mouth daily.   30 tablet   0   . gabapentin (NEURONTIN) 100 MG capsule   Oral   Take 100 mg by mouth 3 (three) times daily.          Marland Kitchen guaiFENesin (MUCINEX) 600 MG 12 hr tablet   Oral   Take 600 mg by mouth 2 (two) times daily as needed for cough.          Marland Kitchen HYDROcodone-acetaminophen (NORCO) 10-325 MG per tablet   Oral   Take 1 tablet by mouth every 12 (twelve) hours as needed for severe pain.   30 tablet   0   . levothyroxine (SYNTHROID, LEVOTHROID) 112 MCG tablet   Oral   Take 112 mcg by mouth daily before breakfast.         . metFORMIN (GLUCOPHAGE) 500 MG tablet   Oral   Take 1 tablet (500 mg total) by mouth 2 (two) times daily with a meal.   60 tablet   3   . pantoprazole (PROTONIX) 40 MG tablet   Oral   Take 40 mg by mouth daily.          . ramipril (ALTACE) 5 MG capsule   Oral   Take 1 capsule (5 mg total) by mouth daily.   30 capsule  1   . simvastatin (ZOCOR) 40 MG tablet   Oral   Take 40 mg by mouth daily at 6 PM.          . theophylline  (THEODUR) 200 MG 12 hr tablet   Oral   Take 400 mg by mouth 2 (two) times daily.         . vitamin E 1000 UNIT capsule   Oral   Take 1,000 Units by mouth daily.         . sodium chloride (OCEAN) 0.65 % SOLN nasal spray   Each Nare   Place 1 spray into both nostrils as needed for congestion (also available OTC).   30 mL   2    Triage Vitals: BP 101/49  Temp(Src) 98.1 F (36.7 C) (Oral)  Ht 5\' 2"  (1.575 m)  Wt 187 lb (84.823 kg)  BMI 34.19 kg/m2  SpO2 92% Physical Exam  Constitutional: She is oriented to person, place, and time. She appears well-developed.  HENT:  Head: Normocephalic.  Eyes: Conjunctivae and EOM are normal. No scleral icterus.  Neck: Neck supple. No thyromegaly present.  Cardiovascular: Normal rate and regular rhythm.  Exam reveals no gallop and no friction rub.   No murmur heard. Pulmonary/Chest: No stridor. She has wheezes (Moderate wheezing bilaterally). She has no rales. She exhibits no tenderness.  Abdominal: She exhibits no distension. There is tenderness (moderate epigastric tenderness). There is no rebound.  Musculoskeletal: Normal range of motion. She exhibits no edema.  Lymphadenopathy:    She has no cervical adenopathy.  Neurological: She is oriented to person, place, and time. She exhibits normal muscle tone. Coordination normal.  Skin: No rash noted. No erythema.  Psychiatric: She has a normal mood and affect. Her behavior is normal.    ED Course  Procedures (including critical care time) DIAGNOSTIC STUDIES: Oxygen Saturation is 92% on O2 4L/min.   COORDINATION OF CARE:  6:19 PM-Discussed treatment plan which includes meds, EKG, labs and imaging with pt at bedside and pt agreed to plan.   Labs Review Labs Reviewed - No data to display Imaging Review No results found.   EKG Interpretation None        MDM   Final diagnoses:  None    chf    The chart was scribed for me under my direct supervision.  I personally performed  the history, physical, and medical decision making and all procedures in the evaluation of this patient.Maudry Diego, MD 02/28/14 2108

## 2014-02-28 NOTE — H&P (Signed)
[facility] History and Physical  Joanna Perez WPY:[id number] DOB: [birth year] DOA: 02/28/2014  Referring physician: ED physician PCP: Glo Herring., MD   Chief Complaint: Shortness of breath  HPI:  Patient is 59 year old female who presents to emergency department with main concern of progressively worsening shortness of breath patient explains this has started several days prior to admission, initially present with exertion and now present at rest. Patient reports similar events in the past that were do to fluid overload. Patient denies chest pain, fevers chills, no abdominal or urinary concerns. She explains she noted nonproductive cough, wheezing, 2-3 pillow orthopnea, lower extremity swelling. She denies recent sick contacts or exposures, no recent changes in medications.  In emergency department, patient noted to be wheezing, crackles at lung bases noted on exam. Chest x-ray with CHF. [facility] asked to admit for further evaluation and management  Assessment and Plan: Active Problems:   Acute respiratory failure - Secondary to CHF and possibly COPD - Will admit to telemetry bed, start with providing supportive care with oxygen, bronchodilators scheduled and as needed - Placed on Lasix   CHF (congestive heart failure), diastolic - Management with Lasix, monitor daily weights, weight on admission 187 pounds - Monitor strict intake and output, monitor renal function closely - Obtain 2-D echo - Last 2D echo done in 2014 notable for EF of 50-55% with grade 2 diastolic dysfunction   Acute renal failure - Close monitoring of renal function as patient is on Lasix - Hold metformin and ACE inhibitor Ramipril - Repeat BMP in the morning   Acute on chronic COPD - Continue bronchodilators scheduled and as needed, oxygen as needed   Diabetes mellitus - Hold metformin for now, place on sliding scale insulin until oral intake improves - Check A1c 6   Hypertension - Continue home  medical regimen, hold ACEI until renal function improves   Atrial fibrillation - Continue eliquis, cardizem    Hyperlipidemia - Continue statin  Radiological Exams on Admission: Dg Chest Portable 1 View  02/28/2014   CLINICAL DATA:  Cough and shortness of Breath.  EXAM: PORTABLE CHEST - 1 VIEW  COMPARISON:  01/26/2014  FINDINGS: The heart is enlarged. The mediastinal and hilar contours are stable. There is vascular congestion and pulmonary edema consistent with CHF. No definite pleural effusions.  IMPRESSION: CHF.   Electronically Signed   By: Kalman Jewels M.D.   On: 02/28/2014 19:23    Code Status: Full Family Communication: Pt at bedside Disposition Plan: Admit for further evaluation     Review of Systems:  Constitutional: Negative for fever, chills and malaise/fatigue. Negative for diaphoresis.  HENT: Negative for hearing loss, ear pain, nosebleeds, congestion, sore throat, neck pain, tinnitus and ear discharge.   Eyes: Negative for blurred vision, double vision, photophobia, pain, discharge and redness.  Respiratory: Negative stridor.   Cardiovascular: Negative for chest pain, palpitations, claudication.  Gastrointestinal: Negative for heartburn, constipation, blood in stool and melena.  Genitourinary: Negative for dysuria, urgency, frequency, hematuria and flank pain.  Musculoskeletal: Negative for myalgias, back pain, joint pain and falls.  Skin: Negative for itching and rash.  Neurological: Negative for tingling, tremors, sensory change, speech change, focal weakness, loss of consciousness and headaches.  Endo/Heme/Allergies: Negative for environmental allergies and polydipsia. Does not bruise/bleed easily.  Psychiatric/Behavioral: Negative for suicidal ideas. The patient is not nervous/anxious.      Past Medical History  Diagnosis Date  . Asthma   . COPD (chronic obstructive pulmonary disease)   . Hypothyroidism   .  Essential hypertension, benign   . Hyperlipidemia   .  Type 2 diabetes mellitus   . Atrial fibrillation   . MI (myocardial infarction)     2012  . Pericardial effusion 09/09/2013  . CHF (congestive heart failure)   . On home O2     4L N/C continuous    Past Surgical History  Procedure Laterality Date  . Hernia repair    . Abdominal hysterectomy    . Stomach surgery      Wound vac currently in place  . Esophagogastroduodenoscopy  11/11/2011    Procedure: ESOPHAGOGASTRODUODENOSCOPY (EGD);  Surgeon: Rogene Houston, MD;  Location: [facility];  Service: Endoscopy;  Laterality: N/A;  . Foreign body removal  11/11/2011    Procedure: FOREIGN BODY REMOVAL;  Surgeon: Rogene Houston, MD;  Location: [facility];  Service: Endoscopy;  Laterality: N/A;  . Tracheal surgery    . Abdominal surgery      Social History:  reports that she quit smoking about 3 months ago. Her smoking use included Cigarettes. She has a 52.5 pack-year smoking history. She has never used smokeless tobacco. She reports that she drinks alcohol. She reports that she does not use illicit drugs.  Allergies  Allergen Reactions  . Amoxicillin Hives  . Bactrim [Sulfamethoxazole-Tmp Ds] Hives  . Erythromycin Hives  . Keflex [Cephalexin] Hives  . Penicillins Itching and Swelling    Sweating    Family History  Problem Relation Age of Onset  . Diabetes Mother     Prior to Admission medications   Medication Sig Start Date End Date Taking? Authorizing Provider  albuterol (PROVENTIL HFA;VENTOLIN HFA) 108 (90 BASE) MCG/ACT inhaler Inhale 1 puff into the lungs every 4 (four) hours as needed for wheezing or shortness of breath.   Yes Historical Provider, MD  albuterol (PROVENTIL) (2.5 MG/3ML) 0.083% nebulizer solution Take 3 mLs (2.5 mg total) by nebulization every 6 (six) hours as needed for wheezing or shortness of breath. 01/22/14  Yes Kathie Dike, MD  ALPRAZolam Duanne Moron) 1 MG tablet Take 0.5-1 mg by mouth 3 (three) times daily.   Yes Historical Provider, MD  apixaban  (ELIQUIS) 5 MG TABS tablet Take 1 tablet (5 mg total) by mouth 2 (two) times daily. 09/29/13  Yes Lendon Colonel, NP  Ascorbic Acid (VITAMIN C) 1000 MG tablet Take 1,000 mg by mouth daily.    Yes Historical Provider, MD  bisacodyl (DULCOLAX) 5 MG EC tablet Take 5 mg by mouth daily as needed for moderate constipation.   Yes Historical Provider, MD  budesonide-formoterol (SYMBICORT) 160-4.5 MCG/ACT inhaler Inhale 2 puffs into the lungs.    Yes Historical Provider, MD  citalopram (CELEXA) 20 MG tablet Take 20 mg by mouth daily.   Yes Historical Provider, MD  diltiazem (CARDIZEM CD) 240 MG 24 hr capsule Take 1 capsule (240 mg total) by mouth daily. 09/29/13  Yes Lendon Colonel, NP  fexofenadine (ALLEGRA) 180 MG tablet Take 180 mg by mouth 2 (two) times daily.   Yes Historical Provider, MD  fluticasone (FLONASE) 50 MCG/ACT nasal spray Place 1 spray into both nostrils.    Yes Historical Provider, MD  furosemide (LASIX) 20 MG tablet Take 2 tablets (40 mg total) by mouth daily. 01/22/14  Yes Lezlie Octave Black, NP  gabapentin (NEURONTIN) 100 MG capsule Take 100 mg by mouth 3 (three) times daily.  08/15/13 08/15/14 Yes Historical Provider, MD  guaiFENesin (MUCINEX) 600 MG 12 hr tablet Take 600 mg by mouth 2 (  two) times daily as needed for cough.    Yes Historical Provider, MD  HYDROcodone-acetaminophen (NORCO) 10-325 MG per tablet Take 1 tablet by mouth every 12 (twelve) hours as needed for severe pain. 11/30/13  Yes Thurnell Lose, MD  levothyroxine (SYNTHROID, LEVOTHROID) 112 MCG tablet Take 112 mcg by mouth daily before breakfast.   Yes Historical Provider, MD  metFORMIN (GLUCOPHAGE) 500 MG tablet Take 1 tablet (500 mg total) by mouth 2 (two) times daily with a meal. 12/22/13  Yes Ripudeep K Rai, MD  pantoprazole (PROTONIX) 40 MG tablet Take 40 mg by mouth daily.    Yes Historical Provider, MD  ramipril (ALTACE) 5 MG capsule Take 1 capsule (5 mg total) by mouth daily. 01/22/14  Yes Lezlie Octave Black, NP  simvastatin  (ZOCOR) 40 MG tablet Take 40 mg by mouth daily at 6 PM.    Yes Historical Provider, MD  theophylline (THEODUR) 200 MG 12 hr tablet Take 400 mg by mouth 2 (two) times daily.   Yes Historical Provider, MD  vitamin E 1000 UNIT capsule Take 1,000 Units by mouth daily.   Yes Historical Provider, MD  sodium chloride (Joanna) 0.65 % SOLN nasal spray Place 1 spray into both nostrils as needed for congestion (also available OTC). 12/22/13   Ripudeep Krystal Eaton, MD    Physical Exam: Filed Vitals:   02/28/14 1758 02/28/14 1900 02/28/14 1950  BP: 101/49  118/60  Pulse:   101  Temp: 98.1 F (36.7 C)    TempSrc: Oral    Resp:   24  Height: 5\' 2"  (1.575 m)    Weight: 84.823 kg (187 lb)    SpO2: 92% 94% 92%    Physical Exam  Constitutional: Appears well-developed and well-nourished. No distress.  HENT: Normocephalic. External right and left ear normal. Oropharynx is clear and moist.  Eyes: Conjunctivae and EOM are normal. PERRLA, no scleral icterus.  Neck: Normal ROM. Neck supple. No JVD. No tracheal deviation. No thyromegaly.  CVS: RRR, S1/S2 +, no murmurs, no gallops, no carotid bruit.  Pulmonary: Effort and breath sounds normal, no stridor, rhonchi, bibasilar crackles, expiratory wheezing  Abdominal: Soft. BS +,  no distension, tenderness, rebound or guarding.  Musculoskeletal: Normal range of motion. +1 bilateral lower extremity edema  Lymphadenopathy: No lymphadenopathy noted, cervical, inguinal. Neuro: Alert. Normal reflexes, muscle tone coordination. No cranial nerve deficit. Skin: Skin is warm and dry. No rash noted. Not diaphoretic. No erythema. No pallor.  Psychiatric: Normal mood and affect. Behavior, judgment, thought content normal.   Labs on Admission:  Basic Metabolic Panel:  Recent Labs Lab 02/28/14 1931  NA 141  K 4.4  CL 98  CO2 31  GLUCOSE 136*  BUN 24*  CREATININE 1.41*  CALCIUM 9.4   Liver Function Tests:  Recent Labs Lab 02/28/14 1931  AST 11  ALT 6  ALKPHOS 62   BILITOT 0.4  PROT 6.5  ALBUMIN 2.7*   No results found for this basename: LIPASE, AMYLASE,  in the last 168 hours No results found for this basename: AMMONIA,  in the last 168 hours CBC:  Recent Labs Lab 02/28/14 1931  WBC 10.8*  NEUTROABS 7.5  HGB 9.3*  HCT 30.2*  MCV 79.1  PLT 285   Cardiac Enzymes:  Recent Labs Lab 02/28/14 1931  TROPONINI <0.30   BNP: No components found with this basename: POCBNP,  CBG: No results found for this basename: GLUCAP,  in the last 168 hours  EKG: Normal sinus rhythm, no  ST/T wave changes  Theodis Blaze, MD  [facility] Pager [phone]  If 7PM-7AM, please contact night-coverage [email or web address] Password [facility] 02/28/2014, 9:31 PM

## 2014-03-01 DIAGNOSIS — I4891 Unspecified atrial fibrillation: Secondary | ICD-10-CM

## 2014-03-01 DIAGNOSIS — J441 Chronic obstructive pulmonary disease with (acute) exacerbation: Secondary | ICD-10-CM

## 2014-03-01 DIAGNOSIS — I5032 Chronic diastolic (congestive) heart failure: Secondary | ICD-10-CM

## 2014-03-01 DIAGNOSIS — E119 Type 2 diabetes mellitus without complications: Secondary | ICD-10-CM

## 2014-03-01 DIAGNOSIS — I517 Cardiomegaly: Secondary | ICD-10-CM

## 2014-03-01 DIAGNOSIS — I10 Essential (primary) hypertension: Secondary | ICD-10-CM

## 2014-03-01 LAB — CBC
HEMATOCRIT: 32.9 % — AB (ref 36.0–46.0)
Hemoglobin: 10 g/dL — ABNORMAL LOW (ref 12.0–15.0)
MCH: 24 pg — ABNORMAL LOW (ref 26.0–34.0)
MCHC: 30.4 g/dL (ref 30.0–36.0)
MCV: 78.9 fL (ref 78.0–100.0)
Platelets: 324 10*3/uL (ref 150–400)
RBC: 4.17 MIL/uL (ref 3.87–5.11)
RDW: 17.8 % — ABNORMAL HIGH (ref 11.5–15.5)
WBC: 7.5 10*3/uL (ref 4.0–10.5)

## 2014-03-01 LAB — GLUCOSE, CAPILLARY
GLUCOSE-CAPILLARY: 284 mg/dL — AB (ref 70–99)
GLUCOSE-CAPILLARY: 280 mg/dL — AB (ref 70–99)
Glucose-Capillary: 250 mg/dL — ABNORMAL HIGH (ref 70–99)
Glucose-Capillary: 266 mg/dL — ABNORMAL HIGH (ref 70–99)

## 2014-03-01 LAB — BASIC METABOLIC PANEL
BUN: 27 mg/dL — ABNORMAL HIGH (ref 6–23)
CO2: 34 meq/L — AB (ref 19–32)
Calcium: 9.8 mg/dL (ref 8.4–10.5)
Chloride: 94 mEq/L — ABNORMAL LOW (ref 96–112)
Creatinine, Ser: 0.78 mg/dL (ref 0.50–1.10)
GFR calc non Af Amer: >90 mL/min (ref >90)
Glucose, Bld: 283 mg/dL — ABNORMAL HIGH (ref 70–99)
POTASSIUM: 4.3 meq/L (ref 3.7–5.3)
Sodium: 141 mEq/L (ref 137–147)

## 2014-03-01 LAB — HEMOGLOBIN A1C
Hgb A1c MFr Bld: 6.8 % — ABNORMAL HIGH (ref <5.7)
MEAN PLASMA GLUCOSE: 148 mg/dL — AB (ref <117)

## 2014-03-01 MED ORDER — LEVOFLOXACIN 500 MG PO TABS
500.0000 mg | ORAL_TABLET | Freq: Every day | ORAL | Status: DC
  Administered 2014-03-01 – 2014-03-04 (×4): 500 mg via ORAL
  Filled 2014-03-01 (×4): qty 1

## 2014-03-01 NOTE — Progress Notes (Signed)
*  PRELIMINARY RESULTS* Echocardiogram 2D Echocardiogram has been performed.  Amor Packard L Indiah Heyden 03/01/2014, 1:25 PM

## 2014-03-01 NOTE — Progress Notes (Signed)
[facility] PROGRESS NOTE  Joanna Perez [id number]:[id number] DOB: 02/08/1956 DOA: 02/28/2014 PCP: Glo Herring., MD  Assessment/Plan: 1. Acute on chronic respiratory failure likely related to COPD exacerbation. Continue current treatments. 2. COPD exacerbation. Continue intravenous steroids, antibiotics and bronchodilators. 3. Chronic diastolic congestive heart failure. Although chest x-ray indicates vascular congestion and pulmonary edema, or shortness of breath appears to be more COPD related. She's continued on her outpatient dose of oral Lasix. We'll continue to monitor intake and output and daily weights 4. Acute renal failure. Resolved with diuretics. ACE inhibitor on hold. 5. Diabetes. Continue sliding scale insulin. 6. Hypertension. Continue home regimen. 7. Atrial fibrillation. On Cardizem and anticoagulation with eliquis  Code Status: full code Family Communication: no family present Disposition Plan: discharge home once improved   Consultants:    Procedures: Echo: - Left ventricle: The cavity size was normal. Systolic function was vigorous. The estimated ejection fraction was in the range of 65% to 70%. Very mild concentric left ventricular hypertrophy. Wall motion was normal; there were no regional wall motion abnormalities. Findings consistent with borderline left ventricular diastolic dysfunction. Normal filling pressures. - Aortic valve: Mildly calcified annulus. Mildly thickened, mildly calcified leaflets. There was no stenosis. - Mitral valve: Calcified annulus. Mildly thickened leaflets . - Left atrium: The atrium was mildly dilated.    Antibiotics:  levaquin 4/9>>  HPI/Subjective: Still feels short of breath and wheezing  Objective: Filed Vitals:   03/01/14 1407  BP: 108/44  Pulse: 98  Temp: 97.7 F (36.5 C)  Resp: 20    Intake/Output Summary (Last 24 hours) at 03/01/14 2041 Last data filed at 03/01/14 1800  Gross per 24 hour  Intake     640 ml  Output    950 ml  Net   -310 ml   Filed Weights   02/28/14 2248 03/01/14 0340 03/01/14 0513  Weight: 88.9 kg (195 lb 15.8 oz) 88.542 kg (195 lb 3.2 oz) 88.678 kg (195 lb 8 oz)    Exam:   General:  NAD  Cardiovascular: s1, s2, rrr  Respiratory: coarse breath sounds bilaterally, diminished  Abdomen: soft, nt, nd, bs+  Musculoskeletal:  No edema b/l   Data Reviewed: Basic Metabolic Panel:  Recent Labs Lab 02/28/14 1931 03/01/14 0530  NA 141 141  K 4.4 4.3  CL 98 94*  CO2 31 34*  GLUCOSE 136* 283*  BUN 24* 27*  CREATININE 1.41* 0.78  CALCIUM 9.4 9.8   Liver Function Tests:  Recent Labs Lab 02/28/14 1931  AST 11  ALT 6  ALKPHOS 62  BILITOT 0.4  PROT 6.5  ALBUMIN 2.7*   No results found for this basename: LIPASE, AMYLASE,  in the last 168 hours No results found for this basename: AMMONIA,  in the last 168 hours CBC:  Recent Labs Lab 02/28/14 1931 03/01/14 0530  WBC 10.8* 7.5  NEUTROABS 7.5  --   HGB 9.3* 10.0*  HCT 30.2* 32.9*  MCV 79.1 78.9  PLT 285 324   Cardiac Enzymes:  Recent Labs Lab 02/28/14 1931  TROPONINI <0.30   BNP (last 3 results)  Recent Labs  12/20/13 0615 01/17/14 0859 02/28/14 1931  PROBNP 1475.0* 1249.0* 784.0*   CBG:  Recent Labs Lab 03/01/14 0746 03/01/14 1105 03/01/14 1618  GLUCAP 284* 280* 250*    No results found for this or any previous visit (from the past 240 hour(s)).   Studies: Dg Chest Portable 1 View  02/28/2014   CLINICAL DATA:  Cough and shortness of  Breath.  EXAM: PORTABLE CHEST - 1 VIEW  COMPARISON:  01/26/2014  FINDINGS: The heart is enlarged. The mediastinal and hilar contours are stable. There is vascular congestion and pulmonary edema consistent with CHF. No definite pleural effusions.  IMPRESSION: CHF.   Electronically Signed   By: Kalman Jewels M.D.   On: 02/28/2014 19:23    Scheduled Meds: . albuterol  2.5 mg Nebulization QID  . ALPRAZolam  0.5-1 mg Oral TID  . apixaban   5 mg Oral BID  . atorvastatin  20 mg Oral q1800  . budesonide-formoterol  2 puff Inhalation BID  . citalopram  20 mg Oral Daily  . diltiazem  240 mg Oral Daily  . fluticasone  1 spray Each Nare Daily  . furosemide  40 mg Oral Daily  . gabapentin  100 mg Oral TID  . insulin aspart  0-9 Units Subcutaneous TID WC  . levothyroxine  112 mcg Oral QAC breakfast  . loratadine  10 mg Oral Daily  . methylPREDNISolone (SOLU-MEDROL) injection  60 mg Intravenous Q6H  . pantoprazole  40 mg Oral Daily  . theophylline  400 mg Oral BID   Continuous Infusions:   Active Problems:   CHF (congestive heart failure)    Time spent: 21mins    Jordana Dugue  [facility] Pager [phone]. If 7PM-7AM, please contact night-coverage at [email or web address], password [facility] 03/01/2014, 8:41 PM  LOS: 1 day

## 2014-03-01 NOTE — Progress Notes (Signed)
Utilization Review Complete  

## 2014-03-02 LAB — GLUCOSE, CAPILLARY
GLUCOSE-CAPILLARY: 207 mg/dL — AB (ref 70–99)
Glucose-Capillary: 284 mg/dL — ABNORMAL HIGH (ref 70–99)
Glucose-Capillary: 263 mg/dL — ABNORMAL HIGH (ref 70–99)
Glucose-Capillary: 290 mg/dL — ABNORMAL HIGH (ref 70–99)

## 2014-03-02 LAB — URINALYSIS, ROUTINE W REFLEX MICROSCOPIC
Bilirubin Urine: NEGATIVE
GLUCOSE, UA: 100 mg/dL — AB
HGB URINE DIPSTICK: NEGATIVE
Ketones, ur: NEGATIVE mg/dL
Leukocytes, UA: NEGATIVE
Nitrite: NEGATIVE
Protein, ur: NEGATIVE mg/dL
Specific Gravity, Urine: 1.01 (ref 1.005–1.030)
Urobilinogen, UA: 0.2 mg/dL (ref 0.0–1.0)
pH: 6 (ref 5.0–8.0)

## 2014-03-02 NOTE — Progress Notes (Signed)
Pt ambulated in hallway with standby assist from NT. Pt tolerated well. Ambulated approximately 200 feet. No complaints.

## 2014-03-02 NOTE — Care Management Note (Signed)
    Page 1 of 1   03/02/2014     3:01:09 PM   CARE MANAGEMENT NOTE 03/02/2014  Patient:  Joanna Perez, Joanna Perez   Account Number:  [id number]  Date Initiated:  03/02/2014  Documentation initiated by:  Theophilus Kinds  Subjective/Objective Assessment:   Pt admitted from home with COPD. Pt lives alone and her husband is in and out of the home. Pt has a son and daughter in law who is active in the care of the pt. Pt has home O2, cane, BSC, walker, nebs, and glucometer. Pt active with [facility] RN.     Action/Plan:   Will arrange resumption with [facility] RN at discharge. No other CM needs noted.   Anticipated DC Date:  03/05/2014   Anticipated DC Plan:  [place]  CM consult      [facility] Choice  Resumption Of Svcs/PTA Provider   Choice offered to / List presented to:  C-1 Patient        [place] arranged  HH-1 RN      [place].   Status of service:  Completed, signed off Medicare Important Message given?   (If response is "NO", the following Medicare IM given date fields will be blank) Date Medicare IM given:   Date Additional Medicare IM given:    Discharge Disposition:  [place]  Per UR Regulation:    If discussed at Long Length of Stay Meetings, dates discussed:    Comments:  03/02/14 [place], RN BSN CM

## 2014-03-02 NOTE — Progress Notes (Signed)
Inpatient Diabetes Program Recommendations  AACE/ADA: New Consensus Statement on Inpatient Glycemic Control (2013)  Target Ranges:  Prepandial:   less than 140 mg/dL      Peak postprandial:   less than 180 mg/dL (1-2 hours)      Critically ill patients:  140 - 180 mg/dL   Results for LOVELEE, FORNER (MRN [id number]) as of 03/02/2014 08:14  Ref. Range 03/01/2014 07:46 03/01/2014 11:05 03/01/2014 16:18 03/01/2014 21:09  Glucose-Capillary Latest Range: 70-99 mg/dL 284 (H) 280 (H) 250 (H) 266 (H)   Diabetes history: DM2 Outpatient Diabetes medications: Metformin 500 mg BID Current orders for Inpatient glycemic control: Novolog 0-9 units AC  Inpatient Diabetes Program Recommendations Correction (SSI): Please increase Novolog correction to moderate scale ACHS. Insulin - Meal Coverage: Please consider ordering Novolog 4 units TID with meals for meal coverage while on steroids. Diet: Please change diet to Carb Modified Diabetic diet.  Thanks, Barnie Alderman, RN, MSN, CCRN Diabetes Coordinator Inpatient Diabetes Program [phone] (Team Pager) [phone] ([facility] office) [phone] [facility] office)

## 2014-03-02 NOTE — Progress Notes (Signed)
[facility] PROGRESS NOTE  Joanna Perez GYI:[id number] DOB: 19-Jun-1956 DOA: 02/28/2014 PCP: Glo Herring., MD  Assessment/Plan: 1. Acute on chronic respiratory failure likely related to COPD exacerbation. Continue current treatments. 2. COPD exacerbation. Continue intravenous steroids, antibiotics and bronchodilators. Appears to be slowly improving 3. Chronic diastolic congestive heart failure. Although chest x-ray indicates vascular congestion and pulmonary edema, or shortness of breath appears to be more COPD related. She's continued on her outpatient dose of oral Lasix. We'll continue to monitor intake and output and daily weights. Urine output is good 4. Acute renal failure. Resolved with diuretics. ACE inhibitor on hold. Recheck basic metabolic panel in morning 5. Diabetes. Continue sliding scale insulin. 6. Hypertension. Continue home regimen. 7. Atrial fibrillation. On Cardizem and anticoagulation with eliquis  Code Status: full code Family Communication: no family present Disposition Plan: discharge home once improved   Consultants:    Procedures: Echo: - Left ventricle: The cavity size was normal. Systolic function was vigorous. The estimated ejection fraction was in the range of 65% to 70%. Very mild concentric left ventricular hypertrophy. Wall motion was normal; there were no regional wall motion abnormalities. Findings consistent with borderline left ventricular diastolic dysfunction. Normal filling pressures. - Aortic valve: Mildly calcified annulus. Mildly thickened, mildly calcified leaflets. There was no stenosis. - Mitral valve: Calcified annulus. Mildly thickened leaflets . - Left atrium: The atrium was mildly dilated.    Antibiotics:  levaquin 4/9>>  HPI/Subjective: Still feels short of breath and wheezing  Objective: Filed Vitals:   03/02/14 1320  BP: 128/72  Pulse: 93  Temp: 98.4 F (36.9 C)  Resp: 20    Intake/Output Summary  (Last 24 hours) at 03/02/14 2019 Last data filed at 03/02/14 1615  Gross per 24 hour  Intake    480 ml  Output   3150 ml  Net  -2670 ml   Filed Weights   03/01/14 0340 03/01/14 0513 03/02/14 0441  Weight: 88.542 kg (195 lb 3.2 oz) 88.678 kg (195 lb 8 oz) 87.952 kg (193 lb 14.4 oz)    Exam:   General:  NAD  Cardiovascular: s1, s2, rrr  Respiratory: coarse breath sounds bilaterally, diminished  Abdomen: soft, nt, nd, bs+  Musculoskeletal:  No edema b/l   Data Reviewed: Basic Metabolic Panel:  Recent Labs Lab 02/28/14 1931 03/01/14 0530  NA 141 141  K 4.4 4.3  CL 98 94*  CO2 31 34*  GLUCOSE 136* 283*  BUN 24* 27*  CREATININE 1.41* 0.78  CALCIUM 9.4 9.8   Liver Function Tests:  Recent Labs Lab 02/28/14 1931  AST 11  ALT 6  ALKPHOS 62  BILITOT 0.4  PROT 6.5  ALBUMIN 2.7*   No results found for this basename: LIPASE, AMYLASE,  in the last 168 hours No results found for this basename: AMMONIA,  in the last 168 hours CBC:  Recent Labs Lab 02/28/14 1931 03/01/14 0530  WBC 10.8* 7.5  NEUTROABS 7.5  --   HGB 9.3* 10.0*  HCT 30.2* 32.9*  MCV 79.1 78.9  PLT 285 324   Cardiac Enzymes:  Recent Labs Lab 02/28/14 1931  TROPONINI <0.30   BNP (last 3 results)  Recent Labs  12/20/13 0615 01/17/14 0859 02/28/14 1931  PROBNP 1475.0* 1249.0* 784.0*   CBG:  Recent Labs Lab 03/01/14 1618 03/01/14 2109 03/02/14 0806 03/02/14 1124 03/02/14 1716  GLUCAP 250* 266* 207* 284* 263*    No results found for this or any previous visit (from the past 240 hour(s)).  Studies: No results found.  Scheduled Meds: . albuterol  2.5 mg Nebulization QID  . ALPRAZolam  0.5-1 mg Oral TID  . apixaban  5 mg Oral BID  . atorvastatin  20 mg Oral q1800  . budesonide-formoterol  2 puff Inhalation BID  . citalopram  20 mg Oral Daily  . diltiazem  240 mg Oral Daily  . fluticasone  1 spray Each Nare Daily  . furosemide  40 mg Oral Daily  . gabapentin  100 mg  Oral TID  . insulin aspart  0-9 Units Subcutaneous TID WC  . levofloxacin  500 mg Oral Daily  . levothyroxine  112 mcg Oral QAC breakfast  . loratadine  10 mg Oral Daily  . methylPREDNISolone (SOLU-MEDROL) injection  60 mg Intravenous Q6H  . pantoprazole  40 mg Oral Daily  . theophylline  400 mg Oral BID   Continuous Infusions:   Active Problems:   CHF (congestive heart failure)    Time spent: 33mins    Jehanzeb Memon  [facility] Pager [phone]. If 7PM-7AM, please contact night-coverage at [email or web address], password [facility] 03/02/2014, 8:19 PM  LOS: 2 days

## 2014-03-03 LAB — CBC
HEMATOCRIT: 37 % (ref 36.0–46.0)
HEMOGLOBIN: 11.4 g/dL — AB (ref 12.0–15.0)
MCH: 24.3 pg — ABNORMAL LOW (ref 26.0–34.0)
MCHC: 30.8 g/dL (ref 30.0–36.0)
MCV: 78.7 fL (ref 78.0–100.0)
Platelets: 432 10*3/uL — ABNORMAL HIGH (ref 150–400)
RBC: 4.7 MIL/uL (ref 3.87–5.11)
RDW: 17.5 % — AB (ref 11.5–15.5)
WBC: 10.6 10*3/uL — AB (ref 4.0–10.5)

## 2014-03-03 LAB — BASIC METABOLIC PANEL
BUN: 26 mg/dL — AB (ref 6–23)
CHLORIDE: 95 meq/L — AB (ref 96–112)
CO2: 35 mEq/L — ABNORMAL HIGH (ref 19–32)
CREATININE: 0.59 mg/dL (ref 0.50–1.10)
Calcium: 10.2 mg/dL (ref 8.4–10.5)
GFR calc Af Amer: >90 mL/min (ref >90)
GFR calc non Af Amer: >90 mL/min (ref >90)
GLUCOSE: 273 mg/dL — AB (ref 70–99)
Potassium: 3.6 mEq/L — ABNORMAL LOW (ref 3.7–5.3)
Sodium: 141 mEq/L (ref 137–147)

## 2014-03-03 LAB — GLUCOSE, CAPILLARY
GLUCOSE-CAPILLARY: 299 mg/dL — AB (ref 70–99)
Glucose-Capillary: 228 mg/dL — ABNORMAL HIGH (ref 70–99)
Glucose-Capillary: 294 mg/dL — ABNORMAL HIGH (ref 70–99)
Glucose-Capillary: 356 mg/dL — ABNORMAL HIGH (ref 70–99)

## 2014-03-03 MED ORDER — PREDNISONE 20 MG PO TABS
40.0000 mg | ORAL_TABLET | Freq: Every day | ORAL | Status: DC
  Administered 2014-03-04: 40 mg via ORAL
  Filled 2014-03-03: qty 2

## 2014-03-03 MED ORDER — HYDROCODONE-HOMATROPINE 5-1.5 MG/5ML PO SYRP
5.0000 mL | ORAL_SOLUTION | Freq: Four times a day (QID) | ORAL | Status: DC | PRN
  Administered 2014-03-03: 5 mL via ORAL
  Filled 2014-03-03 (×2): qty 5

## 2014-03-03 MED ORDER — POTASSIUM CHLORIDE CRYS ER 20 MEQ PO TBCR
40.0000 meq | EXTENDED_RELEASE_TABLET | Freq: Once | ORAL | Status: AC
  Administered 2014-03-03: 40 meq via ORAL
  Filled 2014-03-03: qty 2

## 2014-03-03 MED ORDER — POTASSIUM CHLORIDE CRYS ER 20 MEQ PO TBCR
40.0000 meq | EXTENDED_RELEASE_TABLET | Freq: Once | ORAL | Status: DC
Start: 2014-03-03 — End: 2014-03-03

## 2014-03-04 LAB — BASIC METABOLIC PANEL
BUN: 26 mg/dL — ABNORMAL HIGH (ref 6–23)
CHLORIDE: 96 meq/L (ref 96–112)
CO2: 38 mEq/L — ABNORMAL HIGH (ref 19–32)
Calcium: 10 mg/dL (ref 8.4–10.5)
Creatinine, Ser: 0.59 mg/dL (ref 0.50–1.10)
GFR calc Af Amer: >90 mL/min (ref >90)
Glucose, Bld: 275 mg/dL — ABNORMAL HIGH (ref 70–99)
Potassium: 3.8 mEq/L (ref 3.7–5.3)
SODIUM: 142 meq/L (ref 137–147)

## 2014-03-04 LAB — GLUCOSE, CAPILLARY
GLUCOSE-CAPILLARY: 220 mg/dL — AB (ref 70–99)
Glucose-Capillary: 198 mg/dL — ABNORMAL HIGH (ref 70–99)
Glucose-Capillary: 255 mg/dL — ABNORMAL HIGH (ref 70–99)

## 2014-03-04 MED ORDER — PREDNISONE 10 MG PO TABS: ORAL_TABLET | ORAL | Status: DC

## 2014-03-04 MED ORDER — LEVOFLOXACIN 500 MG PO TABS: 500.0000 mg | ORAL_TABLET | Freq: Every day | ORAL | Status: DC

## 2014-03-04 NOTE — Progress Notes (Signed)
IV removed, site WNL.  Pt given d/c instructions and new prescriptions.  Discussed all home medications (when, how, and why to take), patient verbalizes understanding. Discussed home care with patient, teachback completed. F/U appointment in place, patient states she will all tomorrow to confirm appt, pt states they will keep appointment. Pt is stable at this time and desires to go home.  Pt has portable O2 tank from home to transport with her home.

## 2014-03-04 NOTE — Discharge Summary (Signed)
Physician Discharge Summary  Joanna Perez [id number] DOB: [birth year] DOA: 02/28/2014  PCP: Glo Herring., MD  Admit date: 02/28/2014 Discharge date: 03/04/2014  Time spent: 40 minutes  Recommendations for Outpatient Follow-up:  1. Home health has been arranged 2. Follow up with pulmonology in 1 week 3. Follow up with primary care physician in 1 week  Discharge Diagnoses:  Acute on chronic respiratory failure COPD exacerbation Acute renal failure Diabetes HTN Atrial fibrillation  Discharge Condition: improved  Diet recommendation: low salt, low carb  Filed Weights   03/02/14 0441 03/03/14 0500 03/04/14 0543  Weight: 87.952 kg (193 lb 14.4 oz) 88.089 kg (194 lb 3.2 oz) 91.7 kg (202 lb 2.6 oz)    History of present illness:  Patient is 58 year old female who presents to emergency department with main concern of progressively worsening shortness of breath patient explains this has started several days prior to admission, initially present with exertion and now present at rest. Patient reports similar events in the past that were do to fluid overload. Patient denies chest pain, fevers chills, no abdominal or urinary concerns. She explains she noted nonproductive cough, wheezing, 2-3 pillow orthopnea, lower extremity swelling. She denies recent sick contacts or exposures, no recent changes in medications.  In emergency department, patient noted to be wheezing, crackles at lung bases noted on exam. Chest x-ray with CHF. TRH asked to admit for further evaluation and management   Hospital Course:  This patient was admitted to the hospital for shortness of breath. She was found to have acute on chronic respiratory failure felt to be related to COPD exacerbation. She also now with acute on chronic diastolic congestive heart failure. She was continued on her home dose of Lasix and had good diuresis. She was also continued on IV steroids, bronchodilators and antibiotics. Her wheezing  has resolved. She is returned to her baseline level of functioning. She will follow up with her pulmonologist cardiologist the next one week. Patient is ready for discharge home.  Procedures:  none  Consultations:  none  Discharge Exam: Filed Vitals:   03/04/14 1505  BP: 140/69  Pulse: 89  Temp: 98.3 F (36.8 C)  Resp: 20    General: NAD Cardiovascular: S1, S2, RRR Respiratory: CTA B  Discharge Instructions You were cared for by a hospitalist during your hospital stay. If you have any questions about your discharge medications or the care you received while you were in the hospital after you are discharged, you can call the unit and asked to speak with the hospitalist on call if the hospitalist that took care of you is not available. Once you are discharged, your primary care physician will handle any further medical issues. Please note that NO REFILLS for any discharge medications will be authorized once you are discharged, as it is imperative that you return to your primary care physician (or establish a relationship with a primary care physician if you do not have one) for your aftercare needs so that they can reassess your need for medications and monitor your lab values.  Discharge Orders   Future Appointments Provider Department Dept Phone   05/04/2014 1:20 PM Satira Sark, MD [place] [phone]   Future Orders Complete By Expires   ([place]) Call MD:  Anytime you have any of the following symptoms: 1) 3 pound weight gain in 24 hours or 5 pounds in 1 week 2) shortness of breath, with or without a dry hacking cough 3) swelling in the hands,  feet or stomach 4) if you have to sleep on extra pillows at night in order to breathe.  As directed    Call MD for:  difficulty breathing, headache or visual disturbances  As directed    Call MD for:  temperature >100.4  As directed    Diet - low sodium heart healthy  As directed    Diet Carb Modified   As directed    Increase activity slowly  As directed        Medication List         albuterol 108 (90 BASE) MCG/ACT inhaler  Commonly known as:  PROVENTIL HFA;VENTOLIN HFA  Inhale 1 puff into the lungs every 4 (four) hours as needed for wheezing or shortness of breath.     albuterol (2.5 MG/3ML) 0.083% nebulizer solution  Commonly known as:  PROVENTIL  Take 3 mLs (2.5 mg total) by nebulization every 6 (six) hours as needed for wheezing or shortness of breath.     ALPRAZolam 1 MG tablet  Commonly known as:  XANAX  Take 0.5-1 mg by mouth 3 (three) times daily.     apixaban 5 MG Tabs tablet  Commonly known as:  ELIQUIS  Take 1 tablet (5 mg total) by mouth 2 (two) times daily.     bisacodyl 5 MG EC tablet  Commonly known as:  DULCOLAX  Take 5 mg by mouth daily as needed for moderate constipation.     budesonide-formoterol 160-4.5 MCG/ACT inhaler  Commonly known as:  SYMBICORT  Inhale 2 puffs into the lungs.     citalopram 20 MG tablet  Commonly known as:  CELEXA  Take 20 mg by mouth daily.     diltiazem 240 MG 24 hr capsule  Commonly known as:  CARDIZEM CD  Take 1 capsule (240 mg total) by mouth daily.     fexofenadine 180 MG tablet  Commonly known as:  ALLEGRA  Take 180 mg by mouth 2 (two) times daily.     fluticasone 50 MCG/ACT nasal spray  Commonly known as:  FLONASE  Place 1 spray into both nostrils.     furosemide 20 MG tablet  Commonly known as:  LASIX  Take 2 tablets (40 mg total) by mouth daily.     guaiFENesin 600 MG 12 hr tablet  Commonly known as:  MUCINEX  Take 600 mg by mouth 2 (two) times daily as needed for cough.     HYDROcodone-acetaminophen 10-325 MG per tablet  Commonly known as:  NORCO  Take 1 tablet by mouth every 12 (twelve) hours as needed for severe pain.     levofloxacin 500 MG tablet  Commonly known as:  LEVAQUIN  Take 1 tablet (500 mg total) by mouth daily.     levothyroxine 112 MCG tablet  Commonly known as:  SYNTHROID,  LEVOTHROID  Take 112 mcg by mouth daily before breakfast.     metFORMIN 500 MG tablet  Commonly known as:  GLUCOPHAGE  Take 1 tablet (500 mg total) by mouth 2 (two) times daily with a meal.     NEURONTIN 100 MG capsule  Generic drug:  gabapentin  Take 100 mg by mouth 3 (three) times daily.     pantoprazole 40 MG tablet  Commonly known as:  PROTONIX  Take 40 mg by mouth daily.     predniSONE 10 MG tablet  Commonly known as:  DELTASONE  Take 40mg  po daily for 2 days then 30mg  po daily for 2 days then 20mg  po  daily for 2 days then 10mg  po daily for 2 days then stop     ramipril 5 MG capsule  Commonly known as:  ALTACE  Take 1 capsule (5 mg total) by mouth daily.     simvastatin 40 MG tablet  Commonly known as:  ZOCOR  Take 40 mg by mouth daily at 6 PM.     sodium chloride 0.65 % Soln nasal spray  Commonly known as:  OCEAN  Place 1 spray into both nostrils as needed for congestion (also available OTC).     theophylline 200 MG 12 hr tablet  Commonly known as:  THEODUR  Take 400 mg by mouth 2 (two) times daily.     vitamin C 1000 MG tablet  Take 1,000 mg by mouth daily.     vitamin E 1000 UNIT capsule  Take 1,000 Units by mouth daily.       Allergies  Allergen Reactions  . Amoxicillin Hives  . Bactrim [Sulfamethoxazole-Tmp Ds] Hives  . Erythromycin Hives  . Keflex [Cephalexin] Hives  . Penicillins Itching and Swelling    Sweating       Follow-up Information   Follow up with [facility].   Contact information:   [address] [place] [address] [phone]        The results of significant diagnostics from this hospitalization (including imaging, microbiology, ancillary and laboratory) are listed below for reference.    Significant Diagnostic Studies: Dg Lumbar Spine 2-3 Views  02/22/2014   CLINICAL DATA:  Low back pain of 2 months duration.  EXAM: LUMBAR SPINE - 2-3 VIEW  COMPARISON:  CT 09/02/2010  FINDINGS: There is minimal  curvature convex to the left. There is 2 mm of anterolisthesis L4-5 because of degenerative facet disease. Disc heights are within normal limits. T12 is not well seen on the lateral view. I cannot be sure there is not a compression fracture at that level.  IMPRESSION: Degenerative facet disease at L4-5 with 2 mm of anterolisthesis, similar to the study of 2011.  T12 is not well seen on the lateral view. There could possibly be a compression fracture at that level. Is there pain at the thoracolumbar junction?   Electronically Signed   By: Nelson Chimes M.D.   On: 02/22/2014 10:13   Dg Chest Portable 1 View  02/28/2014   CLINICAL DATA:  Cough and shortness of Breath.  EXAM: PORTABLE CHEST - 1 VIEW  COMPARISON:  01/26/2014  FINDINGS: The heart is enlarged. The mediastinal and hilar contours are stable. There is vascular congestion and pulmonary edema consistent with CHF. No definite pleural effusions.  IMPRESSION: CHF.   Electronically Signed   By: Kalman Jewels M.D.   On: 02/28/2014 19:23    Microbiology: No results found for this or any previous visit (from the past 240 hour(s)).   Labs: Basic Metabolic Panel:  Recent Labs Lab 02/28/14 1931 03/01/14 0530 03/03/14 0647 03/04/14 0617  NA 141 141 141 142  K 4.4 4.3 3.6* 3.8  CL 98 94* 95* 96  CO2 31 34* 35* 38*  GLUCOSE 136* 283* 273* 275*  BUN 24* 27* 26* 26*  CREATININE 1.41* 0.78 0.59 0.59  CALCIUM 9.4 9.8 10.2 10.0   Liver Function Tests:  Recent Labs Lab 02/28/14 1931  AST 11  ALT 6  ALKPHOS 62  BILITOT 0.4  PROT 6.5  ALBUMIN 2.7*   No results found for this basename: LIPASE, AMYLASE,  in the last 168 hours  No results found for this basename: AMMONIA,  in the last 168 hours CBC:  Recent Labs Lab 02/28/14 1931 03/01/14 0530 03/03/14 0647  WBC 10.8* 7.5 10.6*  NEUTROABS 7.5  --   --   HGB 9.3* 10.0* 11.4*  HCT 30.2* 32.9* 37.0  MCV 79.1 78.9 78.7  PLT 285 324 432*   Cardiac Enzymes:  Recent Labs Lab  02/28/14 1931  TROPONINI <0.30   BNP: BNP (last 3 results)  Recent Labs  12/20/13 0615 01/17/14 0859 02/28/14 1931  PROBNP 1475.0* 1249.0* 784.0*   CBG:  Recent Labs Lab 03/03/14 1707 03/03/14 2106 03/04/14 0723 03/04/14 1059 03/04/14 1608  GLUCAP 299* 356* 220* 198* 255*       Signed:  Jolaine Artist Ledonna Dormer  [facility] 03/04/2014, 9:08 PM

## 2014-03-04 NOTE — Discharge Instructions (Signed)
Chronic Obstructive Pulmonary Disease  Chronic obstructive pulmonary disease (COPD) is a common lung condition in which airflow from the lungs is limited. COPD is a general term that can be used to describe many different lung problems that limit airflow, including both chronic bronchitis and emphysema.  If you have COPD, your lung function will probably never return to normal, but there are measures you can take to improve lung function and make yourself feel better.   CAUSES   · Smoking (common).    · Exposure to secondhand smoke.    · Genetic problems.  · Chronic inflammatory lung diseases or recurrent infections.  SYMPTOMS   · Shortness of breath, especially with physical activity.    · Deep, persistent (chronic) cough with a large amount of thick mucus.    · Wheezing.    · Rapid breaths (tachypnea).    · Gray or bluish discoloration (cyanosis) of the skin, especially in fingers, toes, or lips.    · Fatigue.    · Weight loss.    · Frequent infections or episodes when breathing symptoms become much worse (exacerbations).    · Chest tightness.  DIAGNOSIS   Your healthcare provider will take a medical history and perform a physical examination to make the initial diagnosis.  Additional tests for COPD may include:   · Lung (pulmonary) function tests.  · Chest X-ray.  · CT scan.  · Blood tests.  TREATMENT   Treatment available to help you feel better when you have COPD include:   · Inhaler and nebulizer medicines. These help manage the symptoms of COPD and make your breathing more comfortable  · Supplemental oxygen. Supplemental oxygen is only helpful if you have a low oxygen level in your blood.    · Exercise and physical activity. These are beneficial for nearly all people with COPD. Some people may also benefit from a pulmonary rehabilitation program.  HOME CARE INSTRUCTIONS   · Take all medicines (inhaled or pills) as directed by your health care provider.  · Only take over-the-counter or prescription medicines  for pain, fever, or discomfort as directed by your health care provider.    · Avoid over-the-counter medicines or cough syrups that dry up your airway (such as antihistamines) and slow down the elimination of secretions unless instructed otherwise by your healthcare provider.    · If you are a smoker, the most important thing that you can do is stop smoking. Continuing to smoke will cause further lung damage and breathing trouble. Ask your health care provider for help with quitting smoking. He or she can direct you to community resources or hospitals that provide support.  · Avoid exposure to irritants such as smoke, chemicals, and fumes that aggravate your breathing.  · Use oxygen therapy and pulmonary rehabilitation if directed by your health care provider. If you require home oxygen therapy, ask your healthcare provider whether you should purchase a pulse oximeter to measure your oxygen level at home.    · Avoid contact with individuals who have a contagious illness.  · Avoid extreme temperature and humidity changes.  · Eat healthy foods. Eating smaller, more frequent meals and resting before meals may help you maintain your strength.  · Stay active, but balance activity with periods of rest. Exercise and physical activity will help you maintain your ability to do things you want to do.  · Preventing infection and hospitalization is very important when you have COPD. Make sure to receive all the vaccines your health care provider recommends, especially the pneumococcal and influenza vaccines. Ask your healthcare provider whether you   need a pneumonia vaccine.  · Learn and use relaxation techniques to manage stress.  · Learn and use controlled breathing techniques as directed by your health care provider. Controlled breathing techniques include:    · Pursed lip breathing. Start by breathing in (inhaling) through your nose for 1 second. Then, purse your lips as if you were going to whistle and breathe out (exhale)  through the pursed lips for 2 seconds.    · Diaphragmatic breathing. Start by putting one hand on your abdomen just above your waist. Inhale slowly through your nose. The hand on your abdomen should move out. Then purse your lips and exhale slowly. You should be able to feel the hand on your abdomen moving in as you exhale.    · Learn and use controlled coughing to clear mucus from your lungs. Controlled coughing is a series of short, progressive coughs. The steps of controlled coughing are:    1. Lean your head slightly forward.    2. Breathe in deeply using diaphragmatic breathing.    3. Try to hold your breath for 3 seconds.    4. Keep your mouth slightly open while coughing twice.    5. Spit any mucus out into a tissue.    6. Rest and repeat the steps once or twice as needed.  SEEK MEDICAL CARE IF:   · You are coughing up more mucus than usual.    · There is a change in the color or thickness of your mucus.    · Your breathing is more labored than usual.    · Your breathing is faster than usual.    SEEK IMMEDIATE MEDICAL CARE IF:   · You have shortness of breath while you are resting.    · You have shortness of breath that prevents you from:  · Being able to talk.    · Performing your usual physical activities.    · You have chest pain lasting longer than 5 minutes.    · Your skin color is more cyanotic than usual.  · You measure low oxygen saturations for longer than 5 minutes with a pulse oximeter.  MAKE SURE YOU:   · Understand these instructions.  · Will watch your condition.  · Will get help right away if you are not doing well or get worse.  Document Released: 08/19/2005 Document Revised: 08/30/2013 Document Reviewed: 07/06/2013  [employer]® Patient Information ©2014 [employer].

## 2014-03-04 NOTE — Plan of Care (Signed)
Problem: COPD GOLD Progrssion Goal: ABLE TO WEAN TO ROOM AIR Outcome: Not Met (add Reason) Pt on home O2

## 2014-03-05 NOTE — Progress Notes (Signed)
UR chart review completed.  

## 2014-03-13 ENCOUNTER — Encounter (HOSPITAL_COMMUNITY): Payer: Self-pay | Admitting: Emergency Medicine

## 2014-03-13 ENCOUNTER — Inpatient Hospital Stay (HOSPITAL_COMMUNITY)
Admission: EM | Admit: 2014-03-13 | Discharge: 2014-03-15 | DRG: 189 | Disposition: A | Discharge status: Home Health | Payer: Medicaid Other | Attending: Family Medicine | Admitting: Family Medicine

## 2014-03-13 ENCOUNTER — Emergency Department (HOSPITAL_COMMUNITY): Payer: Medicaid Other

## 2014-03-13 DIAGNOSIS — I959 Hypotension, unspecified: Secondary | ICD-10-CM

## 2014-03-13 DIAGNOSIS — F329 Major depressive disorder, single episode, unspecified: Secondary | ICD-10-CM

## 2014-03-13 DIAGNOSIS — J45901 Unspecified asthma with (acute) exacerbation: Secondary | ICD-10-CM

## 2014-03-13 DIAGNOSIS — I10 Essential (primary) hypertension: Secondary | ICD-10-CM | POA: Diagnosis present

## 2014-03-13 DIAGNOSIS — R0600 Dyspnea, unspecified: Secondary | ICD-10-CM

## 2014-03-13 DIAGNOSIS — Z79899 Other long term (current) drug therapy: Secondary | ICD-10-CM

## 2014-03-13 DIAGNOSIS — J9620 Acute and chronic respiratory failure, unspecified whether with hypoxia or hypercapnia: Principal | ICD-10-CM | POA: Diagnosis present

## 2014-03-13 DIAGNOSIS — Z87891 Personal history of nicotine dependence: Secondary | ICD-10-CM

## 2014-03-13 DIAGNOSIS — Z9981 Dependence on supplemental oxygen: Secondary | ICD-10-CM

## 2014-03-13 DIAGNOSIS — T380X5A Adverse effect of glucocorticoids and synthetic analogues, initial encounter: Secondary | ICD-10-CM | POA: Diagnosis present

## 2014-03-13 DIAGNOSIS — J441 Chronic obstructive pulmonary disease with (acute) exacerbation: Secondary | ICD-10-CM | POA: Diagnosis present

## 2014-03-13 DIAGNOSIS — J9600 Acute respiratory failure, unspecified whether with hypoxia or hypercapnia: Secondary | ICD-10-CM

## 2014-03-13 DIAGNOSIS — I2510 Atherosclerotic heart disease of native coronary artery without angina pectoris: Secondary | ICD-10-CM | POA: Diagnosis present

## 2014-03-13 DIAGNOSIS — I5033 Acute on chronic diastolic (congestive) heart failure: Secondary | ICD-10-CM | POA: Diagnosis present

## 2014-03-13 DIAGNOSIS — M549 Dorsalgia, unspecified: Secondary | ICD-10-CM

## 2014-03-13 DIAGNOSIS — Z833 Family history of diabetes mellitus: Secondary | ICD-10-CM

## 2014-03-13 DIAGNOSIS — E785 Hyperlipidemia, unspecified: Secondary | ICD-10-CM | POA: Diagnosis present

## 2014-03-13 DIAGNOSIS — I313 Pericardial effusion (noninflammatory): Secondary | ICD-10-CM

## 2014-03-13 DIAGNOSIS — E039 Hypothyroidism, unspecified: Secondary | ICD-10-CM | POA: Diagnosis present

## 2014-03-13 DIAGNOSIS — I252 Old myocardial infarction: Secondary | ICD-10-CM

## 2014-03-13 DIAGNOSIS — I4891 Unspecified atrial fibrillation: Secondary | ICD-10-CM | POA: Diagnosis present

## 2014-03-13 DIAGNOSIS — J45909 Unspecified asthma, uncomplicated: Secondary | ICD-10-CM

## 2014-03-13 DIAGNOSIS — I509 Heart failure, unspecified: Secondary | ICD-10-CM

## 2014-03-13 DIAGNOSIS — J9610 Chronic respiratory failure, unspecified whether with hypoxia or hypercapnia: Secondary | ICD-10-CM

## 2014-03-13 DIAGNOSIS — I5032 Chronic diastolic (congestive) heart failure: Secondary | ICD-10-CM

## 2014-03-13 DIAGNOSIS — F17210 Nicotine dependence, cigarettes, uncomplicated: Secondary | ICD-10-CM

## 2014-03-13 DIAGNOSIS — E119 Type 2 diabetes mellitus without complications: Secondary | ICD-10-CM

## 2014-03-13 DIAGNOSIS — I3139 Other pericardial effusion (noninflammatory): Secondary | ICD-10-CM

## 2014-03-13 DIAGNOSIS — J449 Chronic obstructive pulmonary disease, unspecified: Secondary | ICD-10-CM

## 2014-03-13 DIAGNOSIS — F3289 Other specified depressive episodes: Secondary | ICD-10-CM

## 2014-03-13 LAB — URINALYSIS, ROUTINE W REFLEX MICROSCOPIC
Bilirubin Urine: NEGATIVE
GLUCOSE, UA: NEGATIVE mg/dL
HGB URINE DIPSTICK: NEGATIVE
Ketones, ur: NEGATIVE mg/dL
Leukocytes, UA: NEGATIVE
Nitrite: NEGATIVE
Protein, ur: NEGATIVE mg/dL
SPECIFIC GRAVITY, URINE: 1.01 (ref 1.005–1.030)
UROBILINOGEN UA: 0.2 mg/dL (ref 0.0–1.0)
pH: 6 (ref 5.0–8.0)

## 2014-03-13 LAB — TROPONIN I
Troponin I: <0.3 ng/mL (ref <0.30)
Troponin I: <0.3 ng/mL (ref <0.30)

## 2014-03-13 LAB — BASIC METABOLIC PANEL
BUN: 13 mg/dL (ref 6–23)
CHLORIDE: 92 meq/L — AB (ref 96–112)
CO2: 37 meq/L — AB (ref 19–32)
Calcium: 9.1 mg/dL (ref 8.4–10.5)
Creatinine, Ser: 0.56 mg/dL (ref 0.50–1.10)
GFR calc Af Amer: >90 mL/min (ref >90)
GFR calc non Af Amer: >90 mL/min (ref >90)
Glucose, Bld: 196 mg/dL — ABNORMAL HIGH (ref 70–99)
POTASSIUM: 4.4 meq/L (ref 3.7–5.3)
SODIUM: 138 meq/L (ref 137–147)

## 2014-03-13 LAB — CBC WITH DIFFERENTIAL/PLATELET
Basophils Absolute: 0 10*3/uL (ref 0.0–0.1)
Basophils Relative: 0 % (ref 0–1)
Eosinophils Absolute: 0.1 10*3/uL (ref 0.0–0.7)
Eosinophils Relative: 1 % (ref 0–5)
HCT: 33.9 % — ABNORMAL LOW (ref 36.0–46.0)
Hemoglobin: 10.5 g/dL — ABNORMAL LOW (ref 12.0–15.0)
LYMPHS ABS: 1.1 10*3/uL (ref 0.7–4.0)
LYMPHS PCT: 9 % — AB (ref 12–46)
MCH: 24.4 pg — ABNORMAL LOW (ref 26.0–34.0)
MCHC: 31 g/dL (ref 30.0–36.0)
MCV: 78.7 fL (ref 78.0–100.0)
Monocytes Absolute: 1 10*3/uL (ref 0.1–1.0)
Monocytes Relative: 8 % (ref 3–12)
NEUTROS PCT: 83 % — AB (ref 43–77)
Neutro Abs: 10.7 10*3/uL — ABNORMAL HIGH (ref 1.7–7.7)
Platelets: 255 10*3/uL (ref 150–400)
RBC: 4.31 MIL/uL (ref 3.87–5.11)
RDW: 18.6 % — ABNORMAL HIGH (ref 11.5–15.5)
WBC: 13 10*3/uL — AB (ref 4.0–10.5)

## 2014-03-13 LAB — GLUCOSE, CAPILLARY
Glucose-Capillary: 261 mg/dL — ABNORMAL HIGH (ref 70–99)
Glucose-Capillary: 386 mg/dL — ABNORMAL HIGH (ref 70–99)

## 2014-03-13 LAB — THEOPHYLLINE LEVEL: Theophylline Lvl: 14 ug/mL (ref 10.0–20.0)

## 2014-03-13 LAB — LACTIC ACID, PLASMA: LACTIC ACID, VENOUS: 1.9 mmol/L (ref 0.5–2.2)

## 2014-03-13 LAB — PRO B NATRIURETIC PEPTIDE: PRO B NATRI PEPTIDE: 439 pg/mL — AB (ref 0–125)

## 2014-03-13 MED ORDER — DILTIAZEM HCL ER COATED BEADS 240 MG PO CP24
240.0000 mg | ORAL_CAPSULE | Freq: Every day | ORAL | Status: DC
  Administered 2014-03-14: 240 mg via ORAL
  Filled 2014-03-13: qty 1

## 2014-03-13 MED ORDER — FUROSEMIDE 40 MG PO TABS
40.0000 mg | ORAL_TABLET | Freq: Every day | ORAL | Status: DC
  Administered 2014-03-14: 40 mg via ORAL
  Filled 2014-03-13: qty 1

## 2014-03-13 MED ORDER — APIXABAN 5 MG PO TABS
5.0000 mg | ORAL_TABLET | Freq: Two times a day (BID) | ORAL | Status: DC
  Administered 2014-03-13 – 2014-03-14 (×3): 5 mg via ORAL
  Filled 2014-03-13 (×3): qty 1

## 2014-03-13 MED ORDER — SALINE SPRAY 0.65 % NA SOLN
1.0000 | NASAL | Status: DC | PRN
  Filled 2014-03-13: qty 44

## 2014-03-13 MED ORDER — INSULIN GLARGINE 100 UNIT/ML ~~LOC~~ SOLN
10.0000 [IU] | Freq: Every day | SUBCUTANEOUS | Status: DC
  Administered 2014-03-13 – 2014-03-14 (×2): 10 [IU] via SUBCUTANEOUS
  Filled 2014-03-13 (×2): qty 0.1

## 2014-03-13 MED ORDER — SIMVASTATIN 20 MG PO TABS
40.0000 mg | ORAL_TABLET | Freq: Every day | ORAL | Status: DC
  Administered 2014-03-13 – 2014-03-14 (×2): 40 mg via ORAL
  Filled 2014-03-13 (×2): qty 2

## 2014-03-13 MED ORDER — ALBUTEROL SULFATE HFA 108 (90 BASE) MCG/ACT IN AERS
1.0000 | INHALATION_SPRAY | RESPIRATORY_TRACT | Status: DC | PRN
  Filled 2014-03-13: qty 6.7

## 2014-03-13 MED ORDER — ALBUTEROL (5 MG/ML) CONTINUOUS INHALATION SOLN
10.0000 mg/h | INHALATION_SOLUTION | Freq: Once | RESPIRATORY_TRACT | Status: AC
  Administered 2014-03-13: 10 mg/h via RESPIRATORY_TRACT
  Filled 2014-03-13: qty 20

## 2014-03-13 MED ORDER — IPRATROPIUM BROMIDE 0.02 % IN SOLN
1.0000 mg | Freq: Once | RESPIRATORY_TRACT | Status: AC
  Administered 2014-03-13: 1 mg via RESPIRATORY_TRACT
  Filled 2014-03-13: qty 5

## 2014-03-13 MED ORDER — VITAMIN E 45 MG (100 UNIT) PO CAPS
1000.0000 [IU] | ORAL_CAPSULE | Freq: Every day | ORAL | Status: DC
  Administered 2014-03-14: 1000 [IU] via ORAL
  Filled 2014-03-13 (×3): qty 2

## 2014-03-13 MED ORDER — INSULIN ASPART 100 UNIT/ML ~~LOC~~ SOLN
0.0000 [IU] | Freq: Three times a day (TID) | SUBCUTANEOUS | Status: DC
  Administered 2014-03-14: 3 [IU] via SUBCUTANEOUS
  Administered 2014-03-14: 11 [IU] via SUBCUTANEOUS
  Administered 2014-03-14: 8 [IU] via SUBCUTANEOUS
  Administered 2014-03-15: 11 [IU] via SUBCUTANEOUS

## 2014-03-13 MED ORDER — INSULIN ASPART 100 UNIT/ML ~~LOC~~ SOLN
0.0000 [IU] | Freq: Three times a day (TID) | SUBCUTANEOUS | Status: DC
  Administered 2014-03-13: 5 [IU] via SUBCUTANEOUS

## 2014-03-13 MED ORDER — ALBUTEROL SULFATE (2.5 MG/3ML) 0.083% IN NEBU: 2.5000 mg | INHALATION_SOLUTION | RESPIRATORY_TRACT | Status: DC | PRN

## 2014-03-13 MED ORDER — SODIUM CHLORIDE 0.9 % IJ SOLN
3.0000 mL | Freq: Two times a day (BID) | INTRAMUSCULAR | Status: DC
  Administered 2014-03-13 – 2014-03-14 (×3): 3 mL via INTRAVENOUS

## 2014-03-13 MED ORDER — VITAMIN C 500 MG PO TABS
1000.0000 mg | ORAL_TABLET | Freq: Every day | ORAL | Status: DC
  Administered 2014-03-14: 1000 mg via ORAL

## 2014-03-13 MED ORDER — ACETAMINOPHEN 650 MG RE SUPP: 650.0000 mg | Freq: Four times a day (QID) | RECTAL | Status: DC | PRN

## 2014-03-13 MED ORDER — LORATADINE 10 MG PO TABS
10.0000 mg | ORAL_TABLET | Freq: Every day | ORAL | Status: DC
  Administered 2014-03-14: 10 mg via ORAL
  Filled 2014-03-13: qty 1

## 2014-03-13 MED ORDER — HYDROCODONE-ACETAMINOPHEN 10-325 MG PO TABS
1.0000 | ORAL_TABLET | Freq: Two times a day (BID) | ORAL | Status: DC | PRN
  Administered 2014-03-14: 1 via ORAL
  Filled 2014-03-13 (×2): qty 1

## 2014-03-13 MED ORDER — BUDESONIDE-FORMOTEROL FUMARATE 160-4.5 MCG/ACT IN AERO
2.0000 | INHALATION_SPRAY | Freq: Two times a day (BID) | RESPIRATORY_TRACT | Status: DC
  Administered 2014-03-13 – 2014-03-15 (×4): 2 via RESPIRATORY_TRACT
  Filled 2014-03-13: qty 6

## 2014-03-13 MED ORDER — LEVOTHYROXINE SODIUM 112 MCG PO TABS
112.0000 ug | ORAL_TABLET | Freq: Every day | ORAL | Status: DC
  Administered 2014-03-14 – 2014-03-15 (×2): 112 ug via ORAL
  Filled 2014-03-13 (×3): qty 1

## 2014-03-13 MED ORDER — IPRATROPIUM-ALBUTEROL 0.5-2.5 (3) MG/3ML IN SOLN: 3.0000 mL | RESPIRATORY_TRACT | Status: DC | PRN

## 2014-03-13 MED ORDER — ALPRAZOLAM 0.5 MG PO TABS
0.5000 mg | ORAL_TABLET | Freq: Three times a day (TID) | ORAL | Status: DC
  Administered 2014-03-13: 0.5 mg via ORAL
  Administered 2014-03-13 – 2014-03-14 (×4): 1 mg via ORAL
  Filled 2014-03-13 (×3): qty 2
  Filled 2014-03-13: qty 1
  Filled 2014-03-13: qty 2

## 2014-03-13 MED ORDER — IPRATROPIUM-ALBUTEROL 0.5-2.5 (3) MG/3ML IN SOLN
3.0000 mL | RESPIRATORY_TRACT | Status: DC
  Administered 2014-03-13 – 2014-03-15 (×9): 3 mL via RESPIRATORY_TRACT
  Filled 2014-03-13 (×9): qty 3

## 2014-03-13 MED ORDER — CITALOPRAM HYDROBROMIDE 20 MG PO TABS
20.0000 mg | ORAL_TABLET | Freq: Every day | ORAL | Status: DC
  Administered 2014-03-14: 20 mg via ORAL
  Filled 2014-03-13: qty 1

## 2014-03-13 MED ORDER — ONDANSETRON HCL 4 MG PO TABS: 4.0000 mg | ORAL_TABLET | Freq: Four times a day (QID) | ORAL | Status: DC | PRN

## 2014-03-13 MED ORDER — ACETAMINOPHEN 325 MG PO TABS: 650.0000 mg | ORAL_TABLET | Freq: Four times a day (QID) | ORAL | Status: DC | PRN

## 2014-03-13 MED ORDER — RAMIPRIL 2.5 MG PO CAPS
5.0000 mg | ORAL_CAPSULE | Freq: Every day | ORAL | Status: DC
  Administered 2014-03-14: 5 mg via ORAL
  Filled 2014-03-13: qty 2

## 2014-03-13 MED ORDER — GABAPENTIN 100 MG PO CAPS
100.0000 mg | ORAL_CAPSULE | Freq: Three times a day (TID) | ORAL | Status: DC
  Administered 2014-03-13 – 2014-03-14 (×5): 100 mg via ORAL
  Filled 2014-03-13 (×5): qty 1

## 2014-03-13 MED ORDER — FUROSEMIDE 10 MG/ML IJ SOLN
40.0000 mg | Freq: Once | INTRAMUSCULAR | Status: AC
  Administered 2014-03-13: 40 mg via INTRAVENOUS
  Filled 2014-03-13: qty 4

## 2014-03-13 MED ORDER — FLUTICASONE PROPIONATE 50 MCG/ACT NA SUSP
1.0000 | Freq: Every day | NASAL | Status: DC
  Administered 2014-03-13 – 2014-03-14 (×2): 1 via NASAL
  Filled 2014-03-13: qty 16

## 2014-03-13 MED ORDER — METHYLPREDNISOLONE SODIUM SUCC 125 MG IJ SOLR
125.0000 mg | Freq: Once | INTRAMUSCULAR | Status: AC
  Administered 2014-03-13: 125 mg via INTRAVENOUS
  Filled 2014-03-13: qty 2

## 2014-03-13 MED ORDER — PANTOPRAZOLE SODIUM 40 MG PO TBEC
40.0000 mg | DELAYED_RELEASE_TABLET | Freq: Every day | ORAL | Status: DC
  Administered 2014-03-14: 40 mg via ORAL
  Filled 2014-03-13: qty 1

## 2014-03-13 MED ORDER — METHYLPREDNISOLONE SODIUM SUCC 125 MG IJ SOLR
80.0000 mg | Freq: Four times a day (QID) | INTRAMUSCULAR | Status: DC
Start: 2014-03-13 — End: 2014-03-14
  Administered 2014-03-13 – 2014-03-14 (×2): 80 mg via INTRAVENOUS
  Filled 2014-03-13 (×2): qty 2

## 2014-03-13 MED ORDER — ONDANSETRON HCL 4 MG/2ML IJ SOLN: 4.0000 mg | Freq: Four times a day (QID) | INTRAMUSCULAR | Status: DC | PRN

## 2014-03-13 MED ORDER — THEOPHYLLINE ER 200 MG PO TB12
400.0000 mg | ORAL_TABLET | Freq: Two times a day (BID) | ORAL | Status: DC
  Administered 2014-03-13 – 2014-03-14 (×3): 400 mg via ORAL
  Filled 2014-03-13 (×3): qty 2

## 2014-03-13 NOTE — ED Notes (Signed)
Complain of being SOB. States her back hurts from coughing. States she was discharged from the hospital recently for pneumonia

## 2014-03-13 NOTE — ED Notes (Signed)
Patient stat when ambulating dropped to 84% room air. MD made aware.

## 2014-03-13 NOTE — ED Provider Notes (Signed)
CSN: [id number]     Arrival date & time 03/13/14  1108 History   First MD Initiated Contact with Patient 03/13/14 1129     Chief Complaint  Patient presents with  . Shortness of Breath     HPI Pt was seen at 1145.  Per pt, c/o gradual onset and worsening of persistent cough, wheezing and SOB for the past 1 week. Describes her symptoms as "my COPD is acting up."  Has been using home MDI, nebs, and O2 N/C without relief.  Denies CP/palpitations, no back pain, no abd pain, no N/V/D, no fevers, no rash.     Past Medical History  Diagnosis Date  . Asthma   . COPD (chronic obstructive pulmonary disease)   . Hypothyroidism   . Essential hypertension, benign   . Hyperlipidemia   . Type 2 diabetes mellitus   . Atrial fibrillation   . MI (myocardial infarction)     2012  . Pericardial effusion 09/09/2013  . CHF (congestive heart failure)   . On home O2     4L N/C continuous   Past Surgical History  Procedure Laterality Date  . Hernia repair    . Abdominal hysterectomy    . Stomach surgery      Wound vac currently in place  . Esophagogastroduodenoscopy  11/11/2011    Procedure: ESOPHAGOGASTRODUODENOSCOPY (EGD);  Surgeon: Rogene Houston, MD;  Location: [facility];  Service: Endoscopy;  Laterality: N/A;  . Foreign body removal  11/11/2011    Procedure: FOREIGN BODY REMOVAL;  Surgeon: Rogene Houston, MD;  Location: [facility];  Service: Endoscopy;  Laterality: N/A;  . Tracheal surgery    . Abdominal surgery     Family History  Problem Relation Age of Onset  . Diabetes Mother    History  Substance Use Topics  . Smoking status: Former Smoker -- 1.50 packs/day for 35 years    Types: Cigarettes    Quit date: 11/27/2013  . Smokeless tobacco: Never Used  . Alcohol Use: Yes     Comment: 1-2 beers nightly   OB History   Grav Para Term Preterm Abortions TAB SAB Ect Mult Living   2 2 2       1      Review of Systems ROS: Statement: All systems negative except as marked or  noted in the HPI; Constitutional: Negative for fever and chills. +generalized weakness/fatigue.; ; Eyes: Negative for eye pain, redness and discharge. ; ; ENMT: Negative for ear pain, hoarseness, nasal congestion, sinus pressure and sore throat. ; ; Cardiovascular: Negative for chest pain, palpitations, diaphoresis, and peripheral edema. ; ; Respiratory: +SOB, cough, wheezing. Negative for stridor.; ; Gastrointestinal: Negative for nausea, vomiting, diarrhea, abdominal pain, blood in stool, hematemesis, jaundice and rectal bleeding. . ; ; Genitourinary: Negative for dysuria, flank pain and hematuria. ; ; Musculoskeletal: Negative for back pain and neck pain. Negative for swelling and trauma.; ; Skin: Negative for pruritus, rash, abrasions, blisters, bruising and skin lesion.; ; Neuro: Negative for headache, lightheadedness and neck stiffness. Negative for altered level of consciousness , altered mental status, extremity weakness, paresthesias, involuntary movement, seizure and syncope.      Allergies  Amoxicillin; Bactrim; Erythromycin; Keflex; and Penicillins  Home Medications   Prior to Admission medications   Medication Sig Start Date End Date Taking? Authorizing Provider  albuterol (PROVENTIL HFA;VENTOLIN HFA) 108 (90 BASE) MCG/ACT inhaler Inhale 1 puff into the lungs every 4 (four) hours as needed for wheezing or shortness of  breath.   Yes Historical Provider, MD  albuterol (PROVENTIL) (2.5 MG/3ML) 0.083% nebulizer solution Take 3 mLs (2.5 mg total) by nebulization every 6 (six) hours as needed for wheezing or shortness of breath. 01/22/14  Yes Kathie Dike, MD  ALPRAZolam Duanne Moron) 1 MG tablet Take 0.5-1 mg by mouth 3 (three) times daily.   Yes Historical Provider, MD  apixaban (ELIQUIS) 5 MG TABS tablet Take 1 tablet (5 mg total) by mouth 2 (two) times daily. 09/29/13  Yes Lendon Colonel, NP  Ascorbic Acid (VITAMIN C) 1000 MG tablet Take 1,000 mg by mouth daily.    Yes Historical Provider, MD   bisacodyl (DULCOLAX) 5 MG EC tablet Take 5 mg by mouth daily as needed for moderate constipation.   Yes Historical Provider, MD  budesonide-formoterol (SYMBICORT) 160-4.5 MCG/ACT inhaler Inhale 2 puffs into the lungs.    Yes Historical Provider, MD  citalopram (CELEXA) 20 MG tablet Take 20 mg by mouth daily.   Yes Historical Provider, MD  diltiazem (CARDIZEM CD) 240 MG 24 hr capsule Take 1 capsule (240 mg total) by mouth daily. 09/29/13  Yes Lendon Colonel, NP  fexofenadine (ALLEGRA) 180 MG tablet Take 180 mg by mouth 2 (two) times daily.   Yes Historical Provider, MD  fluticasone (FLONASE) 50 MCG/ACT nasal spray Place 1 spray into both nostrils.    Yes Historical Provider, MD  furosemide (LASIX) 20 MG tablet Take 2 tablets (40 mg total) by mouth daily. 01/22/14  Yes Lezlie Octave Black, NP  gabapentin (NEURONTIN) 100 MG capsule Take 100 mg by mouth 3 (three) times daily.  08/15/13 08/15/14 Yes Historical Provider, MD  guaiFENesin (MUCINEX) 600 MG 12 hr tablet Take 600 mg by mouth 2 (two) times daily as needed for cough.    Yes Historical Provider, MD  HYDROcodone-acetaminophen (NORCO) 10-325 MG per tablet Take 1 tablet by mouth every 12 (twelve) hours as needed for severe pain. 11/30/13  Yes Thurnell Lose, MD  levothyroxine (SYNTHROID, LEVOTHROID) 112 MCG tablet Take 112 mcg by mouth daily before breakfast.   Yes Historical Provider, MD  metFORMIN (GLUCOPHAGE) 500 MG tablet Take 1 tablet (500 mg total) by mouth 2 (two) times daily with a meal. 12/22/13  Yes Ripudeep K Rai, MD  pantoprazole (PROTONIX) 40 MG tablet Take 40 mg by mouth daily.    Yes Historical Provider, MD  ramipril (ALTACE) 5 MG capsule Take 1 capsule (5 mg total) by mouth daily. 01/22/14  Yes Lezlie Octave Black, NP  simvastatin (ZOCOR) 40 MG tablet Take 40 mg by mouth daily at 6 PM.    Yes Historical Provider, MD  sodium chloride (OCEAN) 0.65 % SOLN nasal spray Place 1 spray into both nostrils as needed for congestion (also available OTC).  12/22/13  Yes Ripudeep Krystal Eaton, MD  theophylline (THEODUR) 200 MG 12 hr tablet Take 400 mg by mouth 2 (two) times daily.   Yes Historical Provider, MD  vitamin E 1000 UNIT capsule Take 1,000 Units by mouth daily.   Yes Historical Provider, MD   BP 117/58  Pulse 102  Temp(Src) 98.5 F (36.9 C) (Oral)  Resp 14  Ht 5\' 2"  (1.575 m)  Wt 197 lb (89.359 kg)  BMI 36.02 kg/m2  SpO2 94% Physical Exam 1150: Physical examination:  Nursing notes reviewed; Vital signs and O2 SAT reviewed;  Constitutional: Well developed, Well nourished, Uncomfortable appearing.; Head:  Normocephalic, atraumatic; Eyes: EOMI, PERRL, No scleral icterus; ENMT: Mouth and pharynx normal, Mucous membranes dry.;; Neck: Supple, Full  range of motion, No lymphadenopathy; Cardiovascular: Tachycardic rate and rhythm, No gallop; Respiratory: Breath sounds coarse & equal bilaterally, insp/exp wheezes bilat with occasional audible wheezing. Speaking in long phrases. Sitting upright. Tachypneic. No retrax or access mm use.; Chest: Nontender, Movement normal; Abdomen: Soft, Nontender, Nondistended, Normal bowel sounds; Genitourinary: No CVA tenderness; Extremities: Pulses normal, No tenderness, No edema, No calf edema or asymmetry.; Neuro: AA&Ox3, Major CN grossly intact.  Speech clear. No gross focal motor or sensory deficits in extremities.; Skin: Color normal, Warm, Dry.    ED Course  Procedures     EKG Interpretation   Date/Time:  Tuesday March 13 2014 11:25:08 EDT Ventricular Rate:  88 PR Interval:  150 QRS Duration: 62 QT Interval:  336 QTC Calculation: 406 R Axis:   50 Text Interpretation:  Normal sinus rhythm Normal ECG When compared with  ECG of 28-Feb-2014 18:53, No significant change was found Confirmed by  [facility]  MD, Nunzio Cory [phone]) on 03/13/2014 12:24:09 PM      MDM  MDM Reviewed: previous chart, nursing note and vitals Reviewed previous: labs, ECG and x-ray Interpretation: labs, ECG and x-ray Total time  providing critical care: 30-74 minutes. This excludes time spent performing separately reportable procedures and services. Consults: admitting MD    CRITICAL CARE Performed by: Alfonzo Feller Total critical care time: 40 Critical care time was exclusive of separately billable procedures and treating other patients. Critical care was necessary to treat or prevent imminent or life-threatening deterioration. Critical care was time spent personally by me on the following activities: development of treatment plan with patient and/or surrogate as well as nursing, discussions with consultants, evaluation of patient's response to treatment, examination of patient, obtaining history from patient or surrogate, ordering and performing treatments and interventions, ordering and review of laboratory studies, ordering and review of radiographic studies, pulse oximetry and re-evaluation of patient's condition.   Results for orders placed during the hospital encounter of 03/13/14  CBC WITH DIFFERENTIAL      Result Value Ref Range   WBC 13.0 (*) 4.0 - 10.5 K/uL   RBC 4.31  3.87 - 5.11 MIL/uL   Hemoglobin 10.5 (*) 12.0 - 15.0 g/dL   HCT 33.9 (*) 36.0 - 46.0 %   MCV 78.7  78.0 - 100.0 fL   MCH 24.4 (*) 26.0 - 34.0 pg   MCHC 31.0  30.0 - 36.0 g/dL   RDW 18.6 (*) 11.5 - 15.5 %   Platelets 255  150 - 400 K/uL   Neutrophils Relative % 83 (*) 43 - 77 %   Neutro Abs 10.7 (*) 1.7 - 7.7 K/uL   Lymphocytes Relative 9 (*) 12 - 46 %   Lymphs Abs 1.1  0.7 - 4.0 K/uL   Monocytes Relative 8  3 - 12 %   Monocytes Absolute 1.0  0.1 - 1.0 K/uL   Eosinophils Relative 1  0 - 5 %   Eosinophils Absolute 0.1  0.0 - 0.7 K/uL   Basophils Relative 0  0 - 1 %   Basophils Absolute 0.0  0.0 - 0.1 K/uL   WBC Morphology ATYPICAL LYMPHOCYTES    BASIC METABOLIC PANEL      Result Value Ref Range   Sodium 138  137 - 147 mEq/L   Potassium 4.4  3.7 - 5.3 mEq/L   Chloride 92 (*) 96 - 112 mEq/L   CO2 37 (*) 19 - 32 mEq/L    Glucose, Bld 196 (*) 70 - 99 mg/dL   BUN  13  6 - 23 mg/dL   Creatinine, Ser 0.56  0.50 - 1.10 mg/dL   Calcium 9.1  8.4 - 10.5 mg/dL   GFR calc non Af Amer >90  >90 mL/min   GFR calc Af Amer >90  >90 mL/min  TROPONIN I      Result Value Ref Range   Troponin I <0.30  <0.30 ng/mL  PRO B NATRIURETIC PEPTIDE      Result Value Ref Range   Pro B Natriuretic peptide (BNP) 439.0 (*) 0 - 125 pg/mL  LACTIC ACID, PLASMA      Result Value Ref Range   Lactic Acid, Venous 1.9  0.5 - 2.2 mmol/L  URINALYSIS, ROUTINE W REFLEX MICROSCOPIC      Result Value Ref Range   Color, Urine YELLOW  YELLOW   APPearance CLEAR  CLEAR   Specific Gravity, Urine 1.010  1.005 - 1.030   pH 6.0  5.0 - 8.0   Glucose, UA NEGATIVE  NEGATIVE mg/dL   Hgb urine dipstick NEGATIVE  NEGATIVE   Bilirubin Urine NEGATIVE  NEGATIVE   Ketones, ur NEGATIVE  NEGATIVE mg/dL   Protein, ur NEGATIVE  NEGATIVE mg/dL   Urobilinogen, UA 0.2  0.0 - 1.0 mg/dL   Nitrite NEGATIVE  NEGATIVE   Leukocytes, UA NEGATIVE  NEGATIVE    Dg Chest Portable 1 View 03/13/2014   CLINICAL DATA:  Short of breath  EXAM: PORTABLE CHEST - 1 VIEW  COMPARISON:  02/28/2014  FINDINGS: The heart size and mediastinal contours are within normal limits. Both lungs are clear. The visualized skeletal structures are unremarkable.  IMPRESSION: No active disease.   Electronically Signed   By: Franchot Gallo M.D.   On: 03/13/2014 12:02    1155:   Pt uncomfortable appearing, SOB, hypoxic despite wearing her O2 N/C (Sats 89% on 4L N/C O2), lungs with diffuse wheezing: hour long neb and IV solumedrol given.    1400:  Hour long neb completed, lungs continue coarse with scattered wheezing. Resting Sats improved to 96-98% on pt's usual O2 4L N/C. Pt ambulated with Sats dropping to 82% and pt c/o increasing SOB. Pt placed back on stretcher, Sats increased to 94-96%.  H/H per baseline. BNP improved from previous and no pulm vasc congestion on CXR; doubt CHF at this time. Dx and  testing d/w pt and family.  Questions answered.  Verb understanding, agreeable to admit.  T/C to [facility]. Carles Collet, case discussed, including:  HPI, pertinent PM/SHx, VS/PE, dx testing, ED course and treatment:  Agreeable to admit, requests he will come to ED for eval.     Alfonzo Feller, DO 03/14/14 [phone]

## 2014-03-13 NOTE — H&P (Signed)
History and Physical  MILDERD MANOCCHIO XNA:[id number] DOB: [birth year] DOA: 03/13/2014   PCP: Glo Herring., MD   Chief Complaint: Dyspnea  HPI:  58 year old female with a history of diastolic CHF, COPD (4L home O2), hypertension, hypothyroidism, atrial fibrillation, CAD with history of MI, diabetes mellitus presents with a one-day history of worsening shortness of breath and 2 days of worsening cough that is nonproductive. She denies any fevers, chills, vomiting, diarrhea, dysuria, hematuria, hematochezia, melena. The patient complains of some nausea with intermittent chest discomfort on exertion. In addition, she feels that she has gained 8 pounds in the past 4 days. She endorses compliance with all her medications at home. She complains of some orthopnea, but states that this is usual for her. The patient states that she is on the last day of her prednisone from her previous prednisone range after discharge from the hospital on 03/04/2014.  In the ED, the patient was received 1 hour long nebulizer with 125 mg of IV Solu-Medrol. Upon walking, the patient had desaturation to 82%. Her oxygen improved to 95% on her home 4 L. She states that she is breathing a little bit better with the above interventions, but she continues to be short of breath. EKG is unremarkable without ST-T wave change; chest x-ray shows chronic interstitial changes. ProBNP was 439. CBC showed WBC 13.0. BMP was unremarkable. Troponin was negative.  Assessment/Plan:  acute on chronic respiratory failure  -Secondary to COPD exacerbation with a component of CHF  -Continue supplemental oxygen  -Maintain 4 L nasal cannula with a goal SaO2>92% -Pulmonary hygiene  COPD exacerbation -Solu-Medrol 80 mg IV every 6 hours -Aerosolized albuterol and Atrovent -Pulmonary hygiene -Flutter valve -Continue theophylline Acute on chronic diastolic CHF -54/27/0623 echocardiogram shows EF 76-28% with diastolic dysfunction -Today is  as stated weight 89.3 kg -Weight on 03/02/2014--87.9 kg -Give 1 dose of intravenous furosemide -Continue ramipril -Daily weights -Restart furosemide 40 mg by mouth daily 03/14/2014 Atrial fibrillation -Presently in sinus rhythm -Continue diltiazem CD -Continue apixiban Diabetes mellitus type 2 controlled -Discontinue metformin -NovoLog sliding scale -02/28/2014 hemoglobin A1c 6.8,      Past Medical History  Diagnosis Date  . Asthma   . COPD (chronic obstructive pulmonary disease)   . Hypothyroidism   . Essential hypertension, benign   . Hyperlipidemia   . Type 2 diabetes mellitus   . Atrial fibrillation   . MI (myocardial infarction)     2012  . Pericardial effusion 09/09/2013  . CHF (congestive heart failure)   . On home O2     4L N/C continuous   Past Surgical History  Procedure Laterality Date  . Hernia repair    . Abdominal hysterectomy    . Stomach surgery      Wound vac currently in place  . Esophagogastroduodenoscopy  11/11/2011    Procedure: ESOPHAGOGASTRODUODENOSCOPY (EGD);  Surgeon: Rogene Houston, MD;  Location: [facility];  Service: Endoscopy;  Laterality: N/A;  . Foreign body removal  11/11/2011    Procedure: FOREIGN BODY REMOVAL;  Surgeon: Rogene Houston, MD;  Location: [facility];  Service: Endoscopy;  Laterality: N/A;  . Tracheal surgery    . Abdominal surgery     Social History:  reports that she quit smoking about 3 months ago. Her smoking use included Cigarettes. She has a 52.5 pack-year smoking history. She has never used smokeless tobacco. She reports that she drinks alcohol. She reports that she does not use illicit drugs.  Family History  Problem Relation Age of Onset  . Diabetes Mother      Allergies  Allergen Reactions  . Amoxicillin Hives  . Bactrim [Sulfamethoxazole-Tmp Ds] Hives  . Erythromycin Hives  . Keflex [Cephalexin] Hives  . Penicillins Itching and Swelling    Sweating      Prior to Admission medications    Medication Sig Start Date End Date Taking? Authorizing Provider  albuterol (PROVENTIL HFA;VENTOLIN HFA) 108 (90 BASE) MCG/ACT inhaler Inhale 1 puff into the lungs every 4 (four) hours as needed for wheezing or shortness of breath.   Yes Historical Provider, MD  albuterol (PROVENTIL) (2.5 MG/3ML) 0.083% nebulizer solution Take 3 mLs (2.5 mg total) by nebulization every 6 (six) hours as needed for wheezing or shortness of breath. 01/22/14  Yes Kathie Dike, MD  ALPRAZolam Duanne Moron) 1 MG tablet Take 0.5-1 mg by mouth 3 (three) times daily.   Yes Historical Provider, MD  apixaban (ELIQUIS) 5 MG TABS tablet Take 1 tablet (5 mg total) by mouth 2 (two) times daily. 09/29/13  Yes Lendon Colonel, NP  Ascorbic Acid (VITAMIN C) 1000 MG tablet Take 1,000 mg by mouth daily.    Yes Historical Provider, MD  bisacodyl (DULCOLAX) 5 MG EC tablet Take 5 mg by mouth daily as needed for moderate constipation.   Yes Historical Provider, MD  budesonide-formoterol (SYMBICORT) 160-4.5 MCG/ACT inhaler Inhale 2 puffs into the lungs.    Yes Historical Provider, MD  citalopram (CELEXA) 20 MG tablet Take 20 mg by mouth daily.   Yes Historical Provider, MD  diltiazem (CARDIZEM CD) 240 MG 24 hr capsule Take 1 capsule (240 mg total) by mouth daily. 09/29/13  Yes Lendon Colonel, NP  fexofenadine (ALLEGRA) 180 MG tablet Take 180 mg by mouth 2 (two) times daily.   Yes Historical Provider, MD  fluticasone (FLONASE) 50 MCG/ACT nasal spray Place 1 spray into both nostrils.    Yes Historical Provider, MD  furosemide (LASIX) 20 MG tablet Take 2 tablets (40 mg total) by mouth daily. 01/22/14  Yes Lezlie Octave Black, NP  gabapentin (NEURONTIN) 100 MG capsule Take 100 mg by mouth 3 (three) times daily.  08/15/13 08/15/14 Yes Historical Provider, MD  guaiFENesin (MUCINEX) 600 MG 12 hr tablet Take 600 mg by mouth 2 (two) times daily as needed for cough.    Yes Historical Provider, MD  HYDROcodone-acetaminophen (NORCO) 10-325 MG per tablet Take 1  tablet by mouth every 12 (twelve) hours as needed for severe pain. 11/30/13  Yes Thurnell Lose, MD  levothyroxine (SYNTHROID, LEVOTHROID) 112 MCG tablet Take 112 mcg by mouth daily before breakfast.   Yes Historical Provider, MD  metFORMIN (GLUCOPHAGE) 500 MG tablet Take 1 tablet (500 mg total) by mouth 2 (two) times daily with a meal. 12/22/13  Yes Ripudeep K Rai, MD  pantoprazole (PROTONIX) 40 MG tablet Take 40 mg by mouth daily.    Yes Historical Provider, MD  ramipril (ALTACE) 5 MG capsule Take 1 capsule (5 mg total) by mouth daily. 01/22/14  Yes Lezlie Octave Black, NP  simvastatin (ZOCOR) 40 MG tablet Take 40 mg by mouth daily at 6 PM.    Yes Historical Provider, MD  sodium chloride (OCEAN) 0.65 % SOLN nasal spray Place 1 spray into both nostrils as needed for congestion (also available OTC). 12/22/13  Yes Ripudeep Krystal Eaton, MD  theophylline (THEODUR) 200 MG 12 hr tablet Take 400 mg by mouth 2 (two) times daily.   Yes Historical Provider, MD  vitamin E 1000 UNIT capsule Take 1,000 Units by mouth daily.   Yes Historical Provider, MD    Review of Systems:  Constitutional:  No weight loss, night sweats,   Head&Eyes: No headache.  No vision loss.  No eye pain or scotoma ENT:  No Difficulty swallowing,Tooth/dental problems,Sore throat,  No ear ache, post nasal drip,  Cardio-vascular:  No PND, swelling in lower extremities,  dizziness, palpitations  GI:  No  abdominal pain, nausea, vomiting, diarrhea, loss of appetite, hematochezia, melena, heartburn, indigestion, Resp:   No coughing up of blood .No wheezing.No chest wall deformity  Skin:  no rash or lesions.  GU:  no dysuria, change in color of urine, no urgency or frequency. No flank pain.  Musculoskeletal:  No joint pain or swelling. No decreased range of motion. No back pain.  Psych:  No change in mood or affect.  Neurologic: No headache, no dysesthesia, no focal weakness, no vision loss. No syncope  Physical Exam: Filed Vitals:    03/13/14 1212 03/13/14 1232 03/13/14 1300 03/13/14 1401  BP:  138/100 120/56 117/58  Pulse:  90 94 102  Temp:      TempSrc:      Resp:  21 18 14   Height:      Weight:      SpO2: 96% 98% 96% 94%   General:  A&O x 3, NAD, nontoxic, pleasant/cooperative Head/Eye: No conjunctival hemorrhage, no icterus, [place]/AT, No nystagmus ENT:  No icterus,  No thrush, good dentition, no pharyngeal exudate Neck:  No masses, no lymphadenpathy, positive JVD CV:  RRR, no rub, no gallop, no S3 Lung:  Bibasilar crackles. Bilateral expiratory wheeze Abdomen: soft/NT, +BS, nondistended, no peritoneal signs Ext: No cyanosis, No rashes, No petechiae, No lymphangitis, No edema   Labs on Admission:  Basic Metabolic Panel:  Recent Labs Lab 03/13/14 1137  NA 138  K 4.4  CL 92*  CO2 37*  GLUCOSE 196*  BUN 13  CREATININE 0.56  CALCIUM 9.1   Liver Function Tests: No results found for this basename: AST, ALT, ALKPHOS, BILITOT, PROT, ALBUMIN,  in the last 168 hours No results found for this basename: LIPASE, AMYLASE,  in the last 168 hours No results found for this basename: AMMONIA,  in the last 168 hours CBC:  Recent Labs Lab 03/13/14 1137  WBC 13.0*  NEUTROABS 10.7*  HGB 10.5*  HCT 33.9*  MCV 78.7  PLT 255   Cardiac Enzymes:  Recent Labs Lab 03/13/14 1137  TROPONINI <0.30   BNP: No components found with this basename: POCBNP,  CBG: No results found for this basename: GLUCAP,  in the last 168 hours  Radiological Exams on Admission: Dg Chest Portable 1 View  03/13/2014   CLINICAL DATA:  Short of breath  EXAM: PORTABLE CHEST - 1 VIEW  COMPARISON:  02/28/2014  FINDINGS: The heart size and mediastinal contours are within normal limits. Both lungs are clear. The visualized skeletal structures are unremarkable.  IMPRESSION: No active disease.   Electronically Signed   By: Franchot Gallo M.D.   On: 03/13/2014 12:02    EKG: Independently reviewed. Sinus rhythm, no ST -T wave change    Time  spent:70 minutes Code Status:   FULL Family Communication:   Husband at bedside   Orson Eva, DO  [facility] Pager [phone]  If 7PM-7AM, please contact night-coverage [email or web address] Password [facility] 03/13/2014, 2:55 PM

## 2014-03-14 DIAGNOSIS — J449 Chronic obstructive pulmonary disease, unspecified: Secondary | ICD-10-CM

## 2014-03-14 LAB — GLUCOSE, CAPILLARY
GLUCOSE-CAPILLARY: 180 mg/dL — AB (ref 70–99)
GLUCOSE-CAPILLARY: 352 mg/dL — AB (ref 70–99)
Glucose-Capillary: 258 mg/dL — ABNORMAL HIGH (ref 70–99)
Glucose-Capillary: 349 mg/dL — ABNORMAL HIGH (ref 70–99)

## 2014-03-14 LAB — BASIC METABOLIC PANEL
BUN: 15 mg/dL (ref 6–23)
CALCIUM: 9.1 mg/dL (ref 8.4–10.5)
CO2: 36 meq/L — AB (ref 19–32)
Chloride: 91 mEq/L — ABNORMAL LOW (ref 96–112)
Creatinine, Ser: 0.49 mg/dL — ABNORMAL LOW (ref 0.50–1.10)
GFR calc Af Amer: >90 mL/min (ref >90)
GFR calc non Af Amer: >90 mL/min (ref >90)
Glucose, Bld: 282 mg/dL — ABNORMAL HIGH (ref 70–99)
POTASSIUM: 4 meq/L (ref 3.7–5.3)
SODIUM: 139 meq/L (ref 137–147)

## 2014-03-14 LAB — CBC
HCT: 35.1 % — ABNORMAL LOW (ref 36.0–46.0)
HEMOGLOBIN: 10.9 g/dL — AB (ref 12.0–15.0)
MCH: 24.2 pg — ABNORMAL LOW (ref 26.0–34.0)
MCHC: 31.1 g/dL (ref 30.0–36.0)
MCV: 77.8 fL — AB (ref 78.0–100.0)
PLATELETS: 259 10*3/uL (ref 150–400)
RBC: 4.51 MIL/uL (ref 3.87–5.11)
RDW: 18.5 % — ABNORMAL HIGH (ref 11.5–15.5)
WBC: 9.8 10*3/uL (ref 4.0–10.5)

## 2014-03-14 MED ORDER — METHYLPREDNISOLONE SODIUM SUCC 125 MG IJ SOLR
60.0000 mg | Freq: Three times a day (TID) | INTRAMUSCULAR | Status: DC
  Administered 2014-03-14 – 2014-03-15 (×3): 60 mg via INTRAVENOUS
  Filled 2014-03-14 (×3): qty 2

## 2014-03-14 MED ORDER — NYSTATIN 100000 UNIT/ML MT SUSP
5.0000 mL | Freq: Four times a day (QID) | OROMUCOSAL | Status: DC
  Administered 2014-03-14: 500000 [IU] via ORAL
  Filled 2014-03-14: qty 5

## 2014-03-14 MED ORDER — GUAIFENESIN ER 600 MG PO TB12
600.0000 mg | ORAL_TABLET | Freq: Two times a day (BID) | ORAL | Status: DC
  Administered 2014-03-14: 600 mg via ORAL
  Filled 2014-03-14: qty 1

## 2014-03-14 MED ORDER — LEVOFLOXACIN IN D5W 500 MG/100ML IV SOLN
500.0000 mg | INTRAVENOUS | Status: DC
  Administered 2014-03-14 – 2014-03-15 (×2): 500 mg via INTRAVENOUS
  Filled 2014-03-14 (×2): qty 100

## 2014-03-14 NOTE — Care Management Note (Signed)
UR completed 

## 2014-03-14 NOTE — Progress Notes (Signed)
[facility] PROGRESS NOTE  Joanna Perez OYD:[id number] DOB: 06-Sep-1956 DOA: 03/13/2014 PCP: Glo Herring., MD  Assessment/Plan: *acute on chronic respiratory failure  Improved -Secondary to COPD exacerbation with a component of CHF  -Continue supplemental oxygen  -Maintain 4 L nasal cannula with a goal SaO2>92%  -Pulmonary hygiene   COPD exacerbation  -Will taper Solu-Medrol 60 mg q. 8 hours  -Aerosolized albuterol and Atrovent  -Pulmonary hygiene  -Flutter valve  - Start Levaquin per pharmacy consultation -Continue theophylline   Acute on chronic diastolic CHF  - Good diuresis response with IV furosemide 40 mg x1 -03/01/2014 echocardiogram shows EF 67-20% with diastolic dysfunction  -Today is as stated weight 89.3 kg  -Weight on 03/02/2014--87.9 kg  -Continue ramipril  -Daily weights  - furosemide 40 mg by mouth daily   Atrial fibrillation  -Presently in sinus rhythm  -Continue diltiazem CD  -Continue apixiban   Diabetes mellitus type 2 controlled  -Discontinue metformin  -NovoLog sliding scale  -02/28/2014 hemoglobin A1c 6.8, - Continue Lantus 10 units subcutaneous at bedtime   Code Status: *Full code Family Communication: *No family at bedside Disposition Plan: Home when stable   Consultants: None  Procedures: None  Antibiotics:  Levaquin 4/22  HPI/Subjective: 58 year old female with a history of diastolic CHF, COPD, hypertension, atrial fibrillation, CAD, diabetes mellitus who came with worsening shortness of breath likely due to COPD exacerbation as well as diastolic heart failure. Patient was given Solu-Medrol, DuoNeb nebulizer, IV Lasix. Patient has had very good urine output, and had breathing has improved. At this time she feels better.  Objective: Filed Vitals:   03/14/14 0421  BP: 134/79  Pulse: 87  Temp: 98 F (36.7 C)  Resp: 20    Intake/Output Summary (Last 24 hours) at 03/14/14 0909 Last data filed at 03/14/14 0853  Gross per 24 hour  Intake    720 ml  Output   2500 ml  Net  -1780 ml   Filed Weights   03/13/14 1113 03/13/14 1539 03/14/14 0421  Weight: 89.359 kg (197 lb) 88.905 kg (196 lb) 89.721 kg (197 lb 12.8 oz)    Exam:  Physical Exam: Head: Normocephalic, atraumatic.  Eyes: No signs of jaundice, EOMI Nose: Mucous membranes dry.  Throat: Oropharynx nonerythematous, no exudate appreciated.  Neck: supple,No deformities, masses, or tenderness noted. Lungs: Bilateral rhonchi Heart: Regular RR. S1 and S2 normal  Abdomen: BS normoactive. Soft, Nondistended, non-tender.  Extremities: No pretibial edema, no erythema   Data Reviewed: Basic Metabolic Panel:  Recent Labs Lab 03/13/14 1137 03/14/14 0501  NA 138 139  K 4.4 4.0  CL 92* 91*  CO2 37* 36*  GLUCOSE 196* 282*  BUN 13 15  CREATININE 0.56 0.49*  CALCIUM 9.1 9.1   Liver Function Tests: No results found for this basename: AST, ALT, ALKPHOS, BILITOT, PROT, ALBUMIN,  in the last 168 hours No results found for this basename: LIPASE, AMYLASE,  in the last 168 hours No results found for this basename: AMMONIA,  in the last 168 hours CBC:  Recent Labs Lab 03/13/14 1137 03/14/14 0501  WBC 13.0* 9.8  NEUTROABS 10.7*  --   HGB 10.5* 10.9*  HCT 33.9* 35.1*  MCV 78.7 77.8*  PLT 255 259   Cardiac Enzymes:  Recent Labs Lab 03/13/14 1137 03/13/14 1721 03/13/14 2244  TROPONINI <0.30 <0.30 <0.30   BNP (last 3 results)  Recent Labs  01/17/14 0859 02/28/14 1931 03/13/14 1137  PROBNP 1249.0* 784.0* 439.0*   CBG:  Recent Labs  Lab 03/13/14 1555 03/13/14 2124 03/14/14 0712  GLUCAP 261* 386* 258*    No results found for this or any previous visit (from the past 240 hour(s)).   Studies: Dg Chest Portable 1 View  03/13/2014   CLINICAL DATA:  Short of breath  EXAM: PORTABLE CHEST - 1 VIEW  COMPARISON:  02/28/2014  FINDINGS: The heart size and mediastinal contours are within normal limits. Both lungs are clear. The  visualized skeletal structures are unremarkable.  IMPRESSION: No active disease.   Electronically Signed   By: Franchot Gallo M.D.   On: 03/13/2014 12:02    Scheduled Meds: . ALPRAZolam  0.5-1 mg Oral TID  . apixaban  5 mg Oral BID  . budesonide-formoterol  2 puff Inhalation BID  . citalopram  20 mg Oral Daily  . diltiazem  240 mg Oral Daily  . fluticasone  1 spray Each Nare Daily  . furosemide  40 mg Oral Daily  . gabapentin  100 mg Oral TID  . insulin aspart  0-15 Units Subcutaneous TID WC  . insulin glargine  10 Units Subcutaneous QHS  . ipratropium-albuterol  3 mL Nebulization Q4H  . levofloxacin (LEVAQUIN) IV  500 mg Intravenous Q24H  . levothyroxine  112 mcg Oral QAC breakfast  . loratadine  10 mg Oral Daily  . methylPREDNISolone (SOLU-MEDROL) injection  80 mg Intravenous Q6H  . pantoprazole  40 mg Oral Daily  . ramipril  5 mg Oral Daily  . simvastatin  40 mg Oral q1800  . sodium chloride  3 mL Intravenous Q12H  . theophylline  400 mg Oral BID  . vitamin C  1,000 mg Oral Daily  . vitamin E  1,000 Units Oral Daily   Continuous Infusions:   Active Problems:   Acute on chronic respiratory failure    Time spent: 25 minutes    [facility] Pager [phone]. If 7PM-7AM, please contact night-coverage at [email or web address], password [facility] 03/14/2014, 9:09 AM  LOS: 1 day

## 2014-03-14 NOTE — Progress Notes (Signed)
ANTIBIOTIC CONSULT NOTE - INITIAL  Pharmacy Consult for Levaquin Indication: bronchitis  Allergies  Allergen Reactions  . Amoxicillin Hives  . Bactrim [Sulfamethoxazole-Tmp Ds] Hives  . Erythromycin Hives  . Keflex [Cephalexin] Hives  . Penicillins Itching and Swelling    Sweating    Patient Measurements: Height: 5\' 2"  (157.5 cm) Weight: 197 lb 12.8 oz (89.721 kg) IBW/kg (Calculated) : 50.1  Vital Signs: Temp: 98 F (36.7 C) (04/22 0421) Temp src: Oral (04/22 0421) BP: 134/79 mmHg (04/22 0421) Pulse Rate: 87 (04/22 0421) Intake/Output from previous day: 04/21 0701 - 04/22 0700 In: 480 [P.O.:480] Out: 2200 [Urine:2200] Intake/Output from this shift: Total I/O In: -  Out: 300 [Urine:300]  Labs:  Recent Labs  03/13/14 1137 03/14/14 0501  WBC 13.0* 9.8  HGB 10.5* 10.9*  PLT 255 259  CREATININE 0.56 0.49*   Estimated Creatinine Clearance: 80.7 ml/min (by C-G formula based on Cr of 0.49). No results found for this basename: VANCOTROUGH, VANCOPEAK, VANCORANDOM, GENTTROUGH, GENTPEAK, GENTRANDOM, TOBRATROUGH, TOBRAPEAK, TOBRARND, AMIKACINPEAK, AMIKACINTROU, AMIKACIN,  in the last 72 hours   Microbiology: No results found for this or any previous visit (from the past 720 hour(s)).  Medical History: Past Medical History  Diagnosis Date  . Asthma   . COPD (chronic obstructive pulmonary disease)   . Hypothyroidism   . Essential hypertension, benign   . Hyperlipidemia   . Type 2 diabetes mellitus   . Atrial fibrillation   . MI (myocardial infarction)     2012  . Pericardial effusion 09/09/2013  . CHF (congestive heart failure)   . On home O2     4L N/C continuous    Medications:  Scheduled:  . ALPRAZolam  0.5-1 mg Oral TID  . apixaban  5 mg Oral BID  . budesonide-formoterol  2 puff Inhalation BID  . citalopram  20 mg Oral Daily  . diltiazem  240 mg Oral Daily  . fluticasone  1 spray Each Nare Daily  . furosemide  40 mg Oral Daily  . gabapentin  100 mg  Oral TID  . insulin aspart  0-15 Units Subcutaneous TID WC  . insulin glargine  10 Units Subcutaneous QHS  . ipratropium-albuterol  3 mL Nebulization Q4H  . levofloxacin (LEVAQUIN) IV  500 mg Intravenous Q24H  . levothyroxine  112 mcg Oral QAC breakfast  . loratadine  10 mg Oral Daily  . methylPREDNISolone (SOLU-MEDROL) injection  80 mg Intravenous Q6H  . pantoprazole  40 mg Oral Daily  . ramipril  5 mg Oral Daily  . simvastatin  40 mg Oral q1800  . sodium chloride  3 mL Intravenous Q12H  . theophylline  400 mg Oral BID  . vitamin C  1,000 mg Oral Daily  . vitamin E  1,000 Units Oral Daily   Assessment: 58 yo F admitted 4/21 with COPD exacerbation.  Levaquin is being added today. Patient remains afebrile with normal WBC.  CXR is negative for any acute changes. Renal function is good.   Levaquin 4/22>>  Goal of Therapy:  Eradicate infection.  Plan:  Levaquin 500mg  IV q24h Change to po once appropriate Monitor renal function and cx data   Joanna Perez Joanna Perez 03/14/2014,8:35 AM

## 2014-03-14 NOTE — Progress Notes (Signed)
Inpatient Diabetes Program Recommendations  AACE/ADA: New Consensus Statement on Inpatient Glycemic Control (2013)  Target Ranges:  Prepandial:   less than 140 mg/dL      Peak postprandial:   less than 180 mg/dL (1-2 hours)      Critically ill patients:  140 - 180 mg/dL   Results for Joanna Perez, Joanna Perez (MRN [id number]) as of 03/14/2014 09:54  Ref. Range 03/04/2014 07:23 03/04/2014 10:59 03/04/2014 16:08 03/13/2014 15:55 03/13/2014 21:24 03/14/2014 07:12  Glucose-Capillary Latest Range: 70-99 mg/dL 220 (H) 198 (H) 255 (H) 261 (H) 386 (H) 258 (H)   Diabetes history: DM2 Outpatient Diabetes medications: Metformin 500 mg BID Current orders for Inpatient glycemic control: Lantus 10 units QHS, Novolog 0-15 units AC  Inpatient Diabetes Program Recommendations Correction (SSI): Please consider increasing Novolog correction to resistant scale and adding Novolog bedtime correction scale. Insulin-Meal Coverage: If post prandial glucose is consistently elevated while on steroids, consider ordering Novolog 4 units TID with meals.  Thanks, Barnie Alderman, RN, MSN, CCRN Diabetes Coordinator Inpatient Diabetes Program [phone] (Team Pager) [phone] (AP office) [phone] [facility] office)

## 2014-03-14 NOTE — Care Management Note (Addendum)
    Page 1 of 1   03/15/2014     9:23:05 AM CARE MANAGEMENT NOTE 03/15/2014  Patient:  Joanna Perez, Joanna Perez   Account Number:  [id number]  Date Initiated:  03/14/2014  Documentation initiated by:  Theophilus Kinds  Subjective/Objective Assessment:   Pt admitted from home with COPd, respiratory failure. Pt lives with her husband and will return home at discharge. Pts functional status is limited by respiratory status. Pt has [facility] RN and her daughter in law is with pt throughout the day     Action/Plan:   to provide care. Pt has home O2, neb machine, walker, BSC, and glucometer. Will reume HH at discharge.   Anticipated DC Date:  03/16/2014   Anticipated DC Plan:  [place]  CM consult      Choice offered to / List presented to:          [facility] arranged  HH-1 RN      [place].   Status of service:  Completed, signed off Medicare Important Message given?   (If response is "NO", the following Medicare IM given date fields will be blank) Date Medicare IM given:   Date Additional Medicare IM given:    Discharge Disposition:  [place]  Per UR Regulation:    If discussed at Long Length of Stay Meetings, dates discussed:    Comments:  03/15/14 [place], RN BSN CM Pt discharged home today with resumption of [facility] RN (per pts choice). Romualdo Bolk of [facility] is aware and will collect the pts information from the chart. [place] services to resume within 48 hours of discharge. Pt and pts nurse aware of discharge arrangements.  03/14/14 [place], RN BSN CM

## 2014-03-15 DIAGNOSIS — I10 Essential (primary) hypertension: Secondary | ICD-10-CM

## 2014-03-15 LAB — BASIC METABOLIC PANEL
BUN: 23 mg/dL (ref 6–23)
CO2: 35 mEq/L — ABNORMAL HIGH (ref 19–32)
CREATININE: 0.54 mg/dL (ref 0.50–1.10)
Calcium: 9.6 mg/dL (ref 8.4–10.5)
Chloride: 94 mEq/L — ABNORMAL LOW (ref 96–112)
Glucose, Bld: 315 mg/dL — ABNORMAL HIGH (ref 70–99)
Potassium: 3.6 mEq/L — ABNORMAL LOW (ref 3.7–5.3)
Sodium: 141 mEq/L (ref 137–147)

## 2014-03-15 LAB — URINE CULTURE: Colony Count: >=100000

## 2014-03-15 LAB — GLUCOSE, CAPILLARY: Glucose-Capillary: 311 mg/dL — ABNORMAL HIGH (ref 70–99)

## 2014-03-15 MED ORDER — LEVOFLOXACIN 500 MG PO TABS: 500.0000 mg | ORAL_TABLET | Freq: Every day | ORAL | Status: DC

## 2014-03-15 MED ORDER — INSULIN GLARGINE 100 UNIT/ML ~~LOC~~ SOLN
10.0000 [IU] | Freq: Every day | SUBCUTANEOUS | Status: DC
Start: 2014-03-15 — End: 2014-04-07

## 2014-03-15 MED ORDER — GUAIFENESIN ER 600 MG PO TB12: 600.0000 mg | ORAL_TABLET | Freq: Two times a day (BID) | ORAL | Status: DC

## 2014-03-15 MED ORDER — PREDNISONE 10 MG PO TABS
ORAL_TABLET | ORAL | Status: DC
Start: 2014-03-15 — End: 2014-03-28

## 2014-03-15 NOTE — Progress Notes (Signed)
Inpatient Diabetes Program Recommendations  AACE/ADA: New Consensus Statement on Inpatient Glycemic Control (2013)  Target Ranges:  Prepandial:   less than 140 mg/dL      Peak postprandial:   less than 180 mg/dL (1-2 hours)      Critically ill patients:  140 - 180 mg/dL   Results for DEVANI, ODONNEL (MRN [id number]) as of 03/15/2014 08:35  Ref. Range 03/14/2014 07:12 03/14/2014 11:05 03/14/2014 16:18 03/14/2014 20:52 03/15/2014 07:20  Glucose-Capillary Latest Range: 70-99 mg/dL 258 (H) 349 (H) 180 (H) 352 (H) 311 (H)   Diabetes history: DM2  Outpatient Diabetes medications: Metformin 500 mg BID  Current orders for Inpatient glycemic control: Lantus 10 units QHS, Novolog 0-15 units AC  Inpatient Diabetes Program Recommendations Insulin - Basal: Please consider increasing Lantus to 15 units QHS. Correction (SSI): Please consider increasing Novolog correction to resistant scale and adding Novolog bedtime correction scale. Insulin-Meal Coverage: Please consider ordering Novolog 4 units TID with meals for meal coverage.  Thanks, Barnie Alderman, RN, MSN, CCRN Diabetes Coordinator Inpatient Diabetes Program [phone] (Team Pager) [phone] (AP office) [phone] [facility] office)

## 2014-03-15 NOTE — Discharge Summary (Signed)
Physician Discharge Summary  Joanna Perez JAS:[id number] DOB: 1955-12-09 DOA: 03/13/2014  PCP: Glo Herring., MD  Admit date: 03/13/2014 Discharge date: 03/15/2014  Time spent: 50* minutes  Recommendations for Outpatient Follow-up:  1. Follow up PCP in 2 weeks  Discharge Diagnoses:  Active Problems:   Acute on chronic respiratory failure   Discharge Condition: Stable  Diet recommendation: Low salt diet  Filed Weights   03/13/14 1539 03/14/14 0421 03/15/14 0512  Weight: 88.905 kg (196 lb) 89.721 kg (197 lb 12.8 oz) 88.2 kg (194 lb 7.1 oz)    History of present illness:  58 year old female with a history of diastolic CHF, COPD (4L home O2), hypertension, hypothyroidism, atrial fibrillation, CAD with history of MI, diabetes mellitus presents with a one-day history of worsening shortness of breath and 2 days of worsening cough that is nonproductive. She denies any fevers, chills, vomiting, diarrhea, dysuria, hematuria, hematochezia, melena. The patient complains of some nausea with intermittent chest discomfort on exertion. In addition, she feels that she has gained 8 pounds in the past 4 days. She endorses compliance with all her medications at home. She complains of some orthopnea, but states that this is usual for her. The patient states that she is on the last day of her prednisone from her previous prednisone range after discharge from the hospital on 03/04/2014.  In the ED, the patient was received 1 hour long nebulizer with 125 mg of IV Solu-Medrol. Upon walking, the patient had desaturation to 82%. Her oxygen improved to 95% on her home 4 L. She states that she is breathing a little bit better with the above interventions, but she continues to be short of breath. EKG is unremarkable without ST-T wave change; chest x-ray shows chronic interstitial changes. ProBNP was 439. CBC showed WBC 13.0. BMP was unremarkable. Troponin was negative.      Hospital Course:  Acute on chronic  respiratory failure  - Improved with solumedrol and levaquin -Secondary to COPD exacerbation with a component of CHF  -Continue supplemental oxygen at home   COPD exacerbation  -patient was started on Solumedrol, tapered solumedrol  -Aerosolized albuterol and Atrovent  -Pulmonary hygiene  -Continue theophylline  - Will discharge her on Prednisone taper and Levaquin  Acute on chronic diastolic CHF  - Good diuresis response with IV furosemide 40 mg x1  -03/01/2014 echocardiogram shows EF 41-93% with diastolic dysfunction  -Today is as stated weight 89.3 kg  -Weight on 03/02/2014--87.9 kg  -Continue ramipril  -Daily weights  - furosemide 40 mg by mouth daily   Atrial fibrillation  -Presently in sinus rhythm  -Continue diltiazem CD  -Continue apixiban   Diabetes mellitus type 2 controlled  -Continue with metformin  -02/28/2014 hemoglobin A1c 6.8,  -Blood glucose has been elevated due to steroids - Will Continue Lantus 10 units subcutaneous at bedtime  at home    Procedures:  None  Consultations:  None  Discharge Exam: Filed Vitals:   03/15/14 0512  BP: 151/88  Pulse: 108  Temp: 97.6 F (36.4 C)  Resp: 20    General: Appear in no acute distress Cardiovascular: S1S2 RRR Respiratory: Clear bilaterally  Discharge Instructions You were cared for by a hospitalist during your hospital stay. If you have any questions about your discharge medications or the care you received while you were in the hospital after you are discharged, you can call the unit and asked to speak with the hospitalist on call if the hospitalist that took care of you is not  available. Once you are discharged, your primary care physician will handle any further medical issues. Please note that NO REFILLS for any discharge medications will be authorized once you are discharged, as it is imperative that you return to your primary care physician (or establish a relationship with a primary care physician  if you do not have one) for your aftercare needs so that they can reassess your need for medications and monitor your lab values.  Discharge Orders   Future Appointments Provider Department Dept Phone   05/04/2014 1:20 PM Jonelle Sidle, MD [facility] [phone]   Future Orders Complete By Expires   Diet - low sodium heart healthy  As directed    Increase activity slowly  As directed        Medication List         albuterol 108 (90 BASE) MCG/ACT inhaler  Commonly known as:  PROVENTIL HFA;VENTOLIN HFA  Inhale 1 puff into the lungs every 4 (four) hours as needed for wheezing or shortness of breath.     albuterol (2.5 MG/3ML) 0.083% nebulizer solution  Commonly known as:  PROVENTIL  Take 3 mLs (2.5 mg total) by nebulization every 6 (six) hours as needed for wheezing or shortness of breath.     ALPRAZolam 1 MG tablet  Commonly known as:  XANAX  Take 0.5-1 mg by mouth 3 (three) times daily.     apixaban 5 MG Tabs tablet  Commonly known as:  ELIQUIS  Take 1 tablet (5 mg total) by mouth 2 (two) times daily.     bisacodyl 5 MG EC tablet  Commonly known as:  DULCOLAX  Take 5 mg by mouth daily as needed for moderate constipation.     budesonide-formoterol 160-4.5 MCG/ACT inhaler  Commonly known as:  SYMBICORT  Inhale 2 puffs into the lungs.     citalopram 20 MG tablet  Commonly known as:  CELEXA  Take 20 mg by mouth daily.     diltiazem 240 MG 24 hr capsule  Commonly known as:  CARDIZEM CD  Take 1 capsule (240 mg total) by mouth daily.     fexofenadine 180 MG tablet  Commonly known as:  ALLEGRA  Take 180 mg by mouth 2 (two) times daily.     fluticasone 50 MCG/ACT nasal spray  Commonly known as:  FLONASE  Place 1 spray into both nostrils.     furosemide 20 MG tablet  Commonly known as:  LASIX  Take 2 tablets (40 mg total) by mouth daily.     guaiFENesin 600 MG 12 hr tablet  Commonly known as:  MUCINEX  Take 1 tablet (600 mg total) by mouth 2 (two)  times daily.     HYDROcodone-acetaminophen 10-325 MG per tablet  Commonly known as:  NORCO  Take 1 tablet by mouth every 12 (twelve) hours as needed for severe pain.     insulin glargine 100 UNIT/ML injection  Commonly known as:  LANTUS  Inject 0.1 mLs (10 Units total) into the skin at bedtime.     levofloxacin 500 MG tablet  Commonly known as:  LEVAQUIN  Take 1 tablet (500 mg total) by mouth daily.     levothyroxine 112 MCG tablet  Commonly known as:  SYNTHROID, LEVOTHROID  Take 112 mcg by mouth daily before breakfast.     metFORMIN 500 MG tablet  Commonly known as:  GLUCOPHAGE  Take 1 tablet (500 mg total) by mouth 2 (two) times daily with a meal.  NEURONTIN 100 MG capsule  Generic drug:  gabapentin  Take 100 mg by mouth 3 (three) times daily.     pantoprazole 40 MG tablet  Commonly known as:  PROTONIX  Take 40 mg by mouth daily.     predniSONE 10 MG tablet  Commonly known as:  DELTASONE  - Prednisone 40 mg po daily x 2 day then  - Prednisone 30 mg po daily x 2 day then  - Prednisone 20 mg po daily x 2 day then  - Prednisone 10 mg daily x 2 day then stop...     ramipril 5 MG capsule  Commonly known as:  ALTACE  Take 1 capsule (5 mg total) by mouth daily.     simvastatin 40 MG tablet  Commonly known as:  ZOCOR  Take 40 mg by mouth daily at 6 PM.     sodium chloride 0.65 % Soln nasal spray  Commonly known as:  OCEAN  Place 1 spray into both nostrils as needed for congestion (also available OTC).     theophylline 200 MG 12 hr tablet  Commonly known as:  THEODUR  Take 400 mg by mouth 2 (two) times daily.     vitamin C 1000 MG tablet  Take 1,000 mg by mouth daily.     vitamin E 1000 UNIT capsule  Take 1,000 Units by mouth daily.       Allergies  Allergen Reactions  . Amoxicillin Hives  . Bactrim [Sulfamethoxazole-Tmp Ds] Hives  . Erythromycin Hives  . Keflex [Cephalexin] Hives  . Penicillins Itching and Swelling    Sweating      The results  of significant diagnostics from this hospitalization (including imaging, microbiology, ancillary and laboratory) are listed below for reference.    Significant Diagnostic Studies: Dg Lumbar Spine 2-3 Views  02/22/2014   CLINICAL DATA:  Low back pain of 2 months duration.  EXAM: LUMBAR SPINE - 2-3 VIEW  COMPARISON:  CT 09/02/2010  FINDINGS: There is minimal curvature convex to the left. There is 2 mm of anterolisthesis L4-5 because of degenerative facet disease. Disc heights are within normal limits. T12 is not well seen on the lateral view. I cannot be sure there is not a compression fracture at that level.  IMPRESSION: Degenerative facet disease at L4-5 with 2 mm of anterolisthesis, similar to the study of 2011.  T12 is not well seen on the lateral view. There could possibly be a compression fracture at that level. Is there pain at the thoracolumbar junction?   Electronically Signed   By: Nelson Chimes M.D.   On: 02/22/2014 10:13   Dg Chest Portable 1 View  03/13/2014   CLINICAL DATA:  Short of breath  EXAM: PORTABLE CHEST - 1 VIEW  COMPARISON:  02/28/2014  FINDINGS: The heart size and mediastinal contours are within normal limits. Both lungs are clear. The visualized skeletal structures are unremarkable.  IMPRESSION: No active disease.   Electronically Signed   By: Franchot Gallo M.D.   On: 03/13/2014 12:02   Dg Chest Portable 1 View  02/28/2014   CLINICAL DATA:  Cough and shortness of Breath.  EXAM: PORTABLE CHEST - 1 VIEW  COMPARISON:  01/26/2014  FINDINGS: The heart is enlarged. The mediastinal and hilar contours are stable. There is vascular congestion and pulmonary edema consistent with CHF. No definite pleural effusions.  IMPRESSION: CHF.   Electronically Signed   By: Kalman Jewels M.D.   On: 02/28/2014 19:23    Microbiology: Recent Results (  from the past 240 hour(s))  URINE CULTURE     Status: None   Collection Time    03/13/14 12:32 PM      Result Value Ref Range Status   Specimen  Description URINE, CLEAN CATCH   Final   Special Requests NONE   Final   Culture  Setup Time     Final   Value: 03/14/2014 00:19     Performed at [facility] PENDING   Incomplete   Culture     Final   Value: Culture reincubated for better growth     Performed at [facility]   Report Status PENDING   Incomplete     Labs: Basic Metabolic Panel:  Recent Labs Lab 03/13/14 1137 03/14/14 0501 03/15/14 0453  NA 138 139 141  K 4.4 4.0 3.6*  CL 92* 91* 94*  CO2 37* 36* 35*  GLUCOSE 196* 282* 315*  BUN 13 15 23   CREATININE 0.56 0.49* 0.54  CALCIUM 9.1 9.1 9.6   Liver Function Tests: No results found for this basename: AST, ALT, ALKPHOS, BILITOT, PROT, ALBUMIN,  in the last 168 hours No results found for this basename: LIPASE, AMYLASE,  in the last 168 hours No results found for this basename: AMMONIA,  in the last 168 hours CBC:  Recent Labs Lab 03/13/14 1137 03/14/14 0501  WBC 13.0* 9.8  NEUTROABS 10.7*  --   HGB 10.5* 10.9*  HCT 33.9* 35.1*  MCV 78.7 77.8*  PLT 255 259   Cardiac Enzymes:  Recent Labs Lab 03/13/14 1137 03/13/14 1721 03/13/14 2244  TROPONINI <0.30 <0.30 <0.30   BNP: BNP (last 3 results)  Recent Labs  01/17/14 0859 02/28/14 1931 03/13/14 1137  PROBNP 1249.0* 784.0* 439.0*   CBG:  Recent Labs Lab 03/14/14 0712 03/14/14 1105 03/14/14 1618 03/14/14 2052 03/15/14 0720  GLUCAP 258* 349* 180* 352* 311*       Signed:  Oswald Hillock  [facility] 03/15/2014, 9:17 AM

## 2014-03-15 NOTE — Progress Notes (Signed)
Patient being d/c home with prescriptions and o2. IV cath removed and intact. Verbalizes understanding of new prescriptions. Follow up with primary MD. No pain/swelling at site.Family at bedside awaiting discharge.

## 2014-03-23 ENCOUNTER — Encounter (HOSPITAL_COMMUNITY): Payer: Self-pay | Admitting: Emergency Medicine

## 2014-03-23 ENCOUNTER — Inpatient Hospital Stay (HOSPITAL_COMMUNITY): Payer: Medicaid Other

## 2014-03-23 ENCOUNTER — Emergency Department (HOSPITAL_COMMUNITY): Payer: Medicaid Other

## 2014-03-23 ENCOUNTER — Inpatient Hospital Stay (HOSPITAL_COMMUNITY)
Admission: EM | Admit: 2014-03-23 | Discharge: 2014-03-28 | DRG: 189 | Disposition: A | Discharge status: Home Health | Payer: Medicaid Other | Attending: Internal Medicine | Admitting: Internal Medicine

## 2014-03-23 DIAGNOSIS — I509 Heart failure, unspecified: Secondary | ICD-10-CM

## 2014-03-23 DIAGNOSIS — J45909 Unspecified asthma, uncomplicated: Secondary | ICD-10-CM

## 2014-03-23 DIAGNOSIS — J9620 Acute and chronic respiratory failure, unspecified whether with hypoxia or hypercapnia: Principal | ICD-10-CM

## 2014-03-23 DIAGNOSIS — IMO0002 Reserved for concepts with insufficient information to code with codable children: Secondary | ICD-10-CM | POA: Diagnosis present

## 2014-03-23 DIAGNOSIS — Z833 Family history of diabetes mellitus: Secondary | ICD-10-CM

## 2014-03-23 DIAGNOSIS — M549 Dorsalgia, unspecified: Secondary | ICD-10-CM

## 2014-03-23 DIAGNOSIS — K439 Ventral hernia without obstruction or gangrene: Secondary | ICD-10-CM

## 2014-03-23 DIAGNOSIS — M8448XA Pathological fracture, other site, initial encounter for fracture: Secondary | ICD-10-CM | POA: Diagnosis present

## 2014-03-23 DIAGNOSIS — E785 Hyperlipidemia, unspecified: Secondary | ICD-10-CM | POA: Diagnosis present

## 2014-03-23 DIAGNOSIS — F3289 Other specified depressive episodes: Secondary | ICD-10-CM

## 2014-03-23 DIAGNOSIS — F17210 Nicotine dependence, cigarettes, uncomplicated: Secondary | ICD-10-CM

## 2014-03-23 DIAGNOSIS — T380X5A Adverse effect of glucocorticoids and synthetic analogues, initial encounter: Secondary | ICD-10-CM | POA: Diagnosis present

## 2014-03-23 DIAGNOSIS — D649 Anemia, unspecified: Secondary | ICD-10-CM

## 2014-03-23 DIAGNOSIS — I5032 Chronic diastolic (congestive) heart failure: Secondary | ICD-10-CM

## 2014-03-23 DIAGNOSIS — I959 Hypotension, unspecified: Secondary | ICD-10-CM

## 2014-03-23 DIAGNOSIS — Z9981 Dependence on supplemental oxygen: Secondary | ICD-10-CM

## 2014-03-23 DIAGNOSIS — J45901 Unspecified asthma with (acute) exacerbation: Secondary | ICD-10-CM

## 2014-03-23 DIAGNOSIS — J441 Chronic obstructive pulmonary disease with (acute) exacerbation: Secondary | ICD-10-CM

## 2014-03-23 DIAGNOSIS — E669 Obesity, unspecified: Secondary | ICD-10-CM

## 2014-03-23 DIAGNOSIS — G8929 Other chronic pain: Secondary | ICD-10-CM | POA: Diagnosis present

## 2014-03-23 DIAGNOSIS — Z87891 Personal history of nicotine dependence: Secondary | ICD-10-CM

## 2014-03-23 DIAGNOSIS — I313 Pericardial effusion (noninflammatory): Secondary | ICD-10-CM

## 2014-03-23 DIAGNOSIS — I2510 Atherosclerotic heart disease of native coronary artery without angina pectoris: Secondary | ICD-10-CM | POA: Diagnosis present

## 2014-03-23 DIAGNOSIS — I4891 Unspecified atrial fibrillation: Secondary | ICD-10-CM

## 2014-03-23 DIAGNOSIS — I10 Essential (primary) hypertension: Secondary | ICD-10-CM

## 2014-03-23 DIAGNOSIS — R0600 Dyspnea, unspecified: Secondary | ICD-10-CM

## 2014-03-23 DIAGNOSIS — E119 Type 2 diabetes mellitus without complications: Secondary | ICD-10-CM

## 2014-03-23 DIAGNOSIS — Z66 Do not resuscitate: Secondary | ICD-10-CM | POA: Diagnosis present

## 2014-03-23 DIAGNOSIS — F329 Major depressive disorder, single episode, unspecified: Secondary | ICD-10-CM

## 2014-03-23 DIAGNOSIS — I3139 Other pericardial effusion (noninflammatory): Secondary | ICD-10-CM

## 2014-03-23 DIAGNOSIS — S22000A Wedge compression fracture of unspecified thoracic vertebra, initial encounter for closed fracture: Secondary | ICD-10-CM | POA: Diagnosis present

## 2014-03-23 DIAGNOSIS — E039 Hypothyroidism, unspecified: Secondary | ICD-10-CM

## 2014-03-23 DIAGNOSIS — J9610 Chronic respiratory failure, unspecified whether with hypoxia or hypercapnia: Secondary | ICD-10-CM

## 2014-03-23 DIAGNOSIS — Z794 Long term (current) use of insulin: Secondary | ICD-10-CM

## 2014-03-23 DIAGNOSIS — I252 Old myocardial infarction: Secondary | ICD-10-CM

## 2014-03-23 DIAGNOSIS — J4489 Other specified chronic obstructive pulmonary disease: Secondary | ICD-10-CM

## 2014-03-23 DIAGNOSIS — J9600 Acute respiratory failure, unspecified whether with hypoxia or hypercapnia: Secondary | ICD-10-CM

## 2014-03-23 DIAGNOSIS — J449 Chronic obstructive pulmonary disease, unspecified: Secondary | ICD-10-CM

## 2014-03-23 HISTORY — DX: Ventral hernia without obstruction or gangrene: K43.9

## 2014-03-23 HISTORY — DX: Wedge compression fracture of unspecified thoracic vertebra, initial encounter for closed fracture: S22.000A

## 2014-03-23 LAB — CBC WITH DIFFERENTIAL/PLATELET
BASOS PCT: 0 % (ref 0–1)
Basophils Absolute: 0 10*3/uL (ref 0.0–0.1)
Eosinophils Absolute: 0.1 10*3/uL (ref 0.0–0.7)
Eosinophils Relative: 1 % (ref 0–5)
HEMATOCRIT: 33.8 % — AB (ref 36.0–46.0)
Hemoglobin: 10.4 g/dL — ABNORMAL LOW (ref 12.0–15.0)
Lymphocytes Relative: 14 % (ref 12–46)
Lymphs Abs: 1.4 10*3/uL (ref 0.7–4.0)
MCH: 24.3 pg — AB (ref 26.0–34.0)
MCHC: 30.8 g/dL (ref 30.0–36.0)
MCV: 79 fL (ref 78.0–100.0)
Monocytes Absolute: 0.5 10*3/uL (ref 0.1–1.0)
Monocytes Relative: 5 % (ref 3–12)
NEUTROS PCT: 80 % — AB (ref 43–77)
Neutro Abs: 8.2 10*3/uL — ABNORMAL HIGH (ref 1.7–7.7)
Platelets: 231 10*3/uL (ref 150–400)
RBC: 4.28 MIL/uL (ref 3.87–5.11)
RDW: 20 % — ABNORMAL HIGH (ref 11.5–15.5)
WBC: 10.2 10*3/uL (ref 4.0–10.5)

## 2014-03-23 LAB — BLOOD GAS, ARTERIAL
ACID-BASE EXCESS: 4.5 mmol/L — AB (ref 0.0–2.0)
Bicarbonate: 29.5 mEq/L — ABNORMAL HIGH (ref 20.0–24.0)
O2 CONTENT: 4 L/min
O2 SAT: 88.2 %
PATIENT TEMPERATURE: 37
TCO2: 27.3 mmol/L (ref 0–100)
pCO2 arterial: 52.3 mmHg — ABNORMAL HIGH (ref 35.0–45.0)
pH, Arterial: 7.369 (ref 7.350–7.450)
pO2, Arterial: 56 mmHg — ABNORMAL LOW (ref 80.0–100.0)

## 2014-03-23 LAB — BASIC METABOLIC PANEL
BUN: 17 mg/dL (ref 6–23)
CALCIUM: 9.8 mg/dL (ref 8.4–10.5)
CO2: 31 mEq/L (ref 19–32)
CREATININE: 0.67 mg/dL (ref 0.50–1.10)
Chloride: 96 mEq/L (ref 96–112)
GFR calc non Af Amer: >90 mL/min (ref >90)
Glucose, Bld: 142 mg/dL — ABNORMAL HIGH (ref 70–99)
POTASSIUM: 4.4 meq/L (ref 3.7–5.3)
Sodium: 139 mEq/L (ref 137–147)

## 2014-03-23 LAB — HEPATIC FUNCTION PANEL
ALK PHOS: 59 U/L (ref 39–117)
ALT: 13 U/L (ref 0–35)
AST: 11 U/L (ref 0–37)
Albumin: 3.4 g/dL — ABNORMAL LOW (ref 3.5–5.2)
BILIRUBIN TOTAL: 0.3 mg/dL (ref 0.3–1.2)
TOTAL PROTEIN: 6.6 g/dL (ref 6.0–8.3)

## 2014-03-23 LAB — GLUCOSE, CAPILLARY
GLUCOSE-CAPILLARY: 285 mg/dL — AB (ref 70–99)
GLUCOSE-CAPILLARY: 303 mg/dL — AB (ref 70–99)

## 2014-03-23 LAB — PRO B NATRIURETIC PEPTIDE: Pro B Natriuretic peptide (BNP): 369 pg/mL — ABNORMAL HIGH (ref 0–125)

## 2014-03-23 LAB — TROPONIN I: Troponin I: <0.3 ng/mL (ref <0.30)

## 2014-03-23 MED ORDER — SALINE SPRAY 0.65 % NA SOLN
1.0000 | NASAL | Status: DC | PRN
  Filled 2014-03-23: qty 44

## 2014-03-23 MED ORDER — LEVOFLOXACIN 750 MG PO TABS
750.0000 mg | ORAL_TABLET | Freq: Every day | ORAL | Status: DC
  Administered 2014-03-24 – 2014-03-28 (×5): 750 mg via ORAL
  Filled 2014-03-23 (×5): qty 1

## 2014-03-23 MED ORDER — SODIUM CHLORIDE 0.9 % IJ SOLN
3.0000 mL | Freq: Two times a day (BID) | INTRAMUSCULAR | Status: DC
  Administered 2014-03-25 – 2014-03-27 (×5): 3 mL via INTRAVENOUS

## 2014-03-23 MED ORDER — SIMVASTATIN 20 MG PO TABS
40.0000 mg | ORAL_TABLET | Freq: Every day | ORAL | Status: DC
  Administered 2014-03-23 – 2014-03-27 (×5): 40 mg via ORAL
  Filled 2014-03-23 (×5): qty 2

## 2014-03-23 MED ORDER — IPRATROPIUM BROMIDE 0.02 % IN SOLN
0.5000 mg | RESPIRATORY_TRACT | Status: DC
Start: 2014-03-23 — End: 2014-03-23

## 2014-03-23 MED ORDER — THEOPHYLLINE ER 200 MG PO TB12
200.0000 mg | ORAL_TABLET | Freq: Two times a day (BID) | ORAL | Status: DC
  Administered 2014-03-23 – 2014-03-28 (×10): 200 mg via ORAL
  Filled 2014-03-23 (×10): qty 1

## 2014-03-23 MED ORDER — HYDROCODONE-ACETAMINOPHEN 10-325 MG PO TABS
1.0000 | ORAL_TABLET | Freq: Two times a day (BID) | ORAL | Status: DC | PRN
  Administered 2014-03-23: 1 via ORAL
  Filled 2014-03-23: qty 1

## 2014-03-23 MED ORDER — IPRATROPIUM-ALBUTEROL 0.5-2.5 (3) MG/3ML IN SOLN
3.0000 mL | RESPIRATORY_TRACT | Status: DC
  Administered 2014-03-23 – 2014-03-28 (×31): 3 mL via RESPIRATORY_TRACT
  Filled 2014-03-23 (×31): qty 3

## 2014-03-23 MED ORDER — ALUM & MAG HYDROXIDE-SIMETH 200-200-20 MG/5ML PO SUSP: 30.0000 mL | Freq: Four times a day (QID) | ORAL | Status: DC | PRN

## 2014-03-23 MED ORDER — IOHEXOL 350 MG/ML SOLN
100.0000 mL | Freq: Once | INTRAVENOUS | Status: AC | PRN
  Administered 2014-03-23: 100 mL via INTRAVENOUS

## 2014-03-23 MED ORDER — FLUTICASONE PROPIONATE 50 MCG/ACT NA SUSP
1.0000 | Freq: Every day | NASAL | Status: DC
  Administered 2014-03-24 – 2014-03-28 (×5): 1 via NASAL
  Filled 2014-03-23: qty 16

## 2014-03-23 MED ORDER — ALBUTEROL SULFATE (2.5 MG/3ML) 0.083% IN NEBU: 2.5000 mg | INHALATION_SOLUTION | RESPIRATORY_TRACT | Status: DC

## 2014-03-23 MED ORDER — BISACODYL 10 MG RE SUPP: 10.0000 mg | Freq: Every day | RECTAL | Status: DC | PRN

## 2014-03-23 MED ORDER — LEVOTHYROXINE SODIUM 112 MCG PO TABS
112.0000 ug | ORAL_TABLET | Freq: Every day | ORAL | Status: DC
  Administered 2014-03-24 – 2014-03-28 (×5): 112 ug via ORAL
  Filled 2014-03-23 (×8): qty 1

## 2014-03-23 MED ORDER — ALBUTEROL SULFATE (2.5 MG/3ML) 0.083% IN NEBU
2.5000 mg | INHALATION_SOLUTION | Freq: Once | RESPIRATORY_TRACT | Status: AC
  Administered 2014-03-23: 2.5 mg via RESPIRATORY_TRACT
  Filled 2014-03-23: qty 3

## 2014-03-23 MED ORDER — FUROSEMIDE 40 MG PO TABS
40.0000 mg | ORAL_TABLET | Freq: Every day | ORAL | Status: DC
  Administered 2014-03-24 – 2014-03-28 (×5): 40 mg via ORAL
  Filled 2014-03-23 (×5): qty 1

## 2014-03-23 MED ORDER — SENNOSIDES-DOCUSATE SODIUM 8.6-50 MG PO TABS: 1.0000 | ORAL_TABLET | Freq: Every evening | ORAL | Status: DC | PRN

## 2014-03-23 MED ORDER — FUROSEMIDE 10 MG/ML IJ SOLN
40.0000 mg | Freq: Once | INTRAMUSCULAR | Status: AC
  Administered 2014-03-23: 40 mg via INTRAVENOUS
  Filled 2014-03-23: qty 4

## 2014-03-23 MED ORDER — POTASSIUM CHLORIDE IN NACL 20-0.9 MEQ/L-% IV SOLN
INTRAVENOUS | Status: AC
  Administered 2014-03-23: 18:00:00 via INTRAVENOUS

## 2014-03-23 MED ORDER — SODIUM CHLORIDE 0.9 % IV SOLN
250.0000 mL | INTRAVENOUS | Status: DC | PRN
  Administered 2014-03-23 – 2014-03-25 (×2): 250 mL via INTRAVENOUS

## 2014-03-23 MED ORDER — BUDESONIDE-FORMOTEROL FUMARATE 160-4.5 MCG/ACT IN AERO
2.0000 | INHALATION_SPRAY | Freq: Two times a day (BID) | RESPIRATORY_TRACT | Status: DC
  Administered 2014-03-23 – 2014-03-28 (×10): 2 via RESPIRATORY_TRACT
  Filled 2014-03-23: qty 6

## 2014-03-23 MED ORDER — INSULIN GLARGINE 100 UNIT/ML ~~LOC~~ SOLN
10.0000 [IU] | Freq: Every day | SUBCUTANEOUS | Status: DC
  Administered 2014-03-23: 10 [IU] via SUBCUTANEOUS
  Filled 2014-03-23 (×3): qty 0.1

## 2014-03-23 MED ORDER — CALCIUM CARBONATE ANTACID 500 MG PO CHEW
1.0000 | CHEWABLE_TABLET | Freq: Two times a day (BID) | ORAL | Status: DC
  Administered 2014-03-23 – 2014-03-28 (×10): 200 mg via ORAL
  Filled 2014-03-23 (×10): qty 1

## 2014-03-23 MED ORDER — DILTIAZEM HCL ER COATED BEADS 240 MG PO CP24
240.0000 mg | ORAL_CAPSULE | Freq: Every day | ORAL | Status: DC
  Administered 2014-03-24 – 2014-03-28 (×5): 240 mg via ORAL
  Filled 2014-03-23 (×5): qty 1

## 2014-03-23 MED ORDER — INSULIN ASPART 100 UNIT/ML ~~LOC~~ SOLN
0.0000 [IU] | Freq: Every day | SUBCUTANEOUS | Status: DC
  Administered 2014-03-23 – 2014-03-24 (×2): 5 [IU] via SUBCUTANEOUS
  Administered 2014-03-25 – 2014-03-26 (×2): 2 [IU] via SUBCUTANEOUS

## 2014-03-23 MED ORDER — RAMIPRIL 2.5 MG PO CAPS
5.0000 mg | ORAL_CAPSULE | Freq: Every day | ORAL | Status: DC
  Administered 2014-03-24 – 2014-03-28 (×5): 5 mg via ORAL
  Filled 2014-03-23 (×5): qty 2

## 2014-03-23 MED ORDER — INSULIN ASPART 100 UNIT/ML ~~LOC~~ SOLN
0.0000 [IU] | Freq: Three times a day (TID) | SUBCUTANEOUS | Status: DC
  Administered 2014-03-23: 11 [IU] via SUBCUTANEOUS
  Administered 2014-03-24: 7 [IU] via SUBCUTANEOUS
  Administered 2014-03-24: 4 [IU] via SUBCUTANEOUS
  Administered 2014-03-24: 11 [IU] via SUBCUTANEOUS
  Administered 2014-03-25: 15 [IU] via SUBCUTANEOUS
  Administered 2014-03-25: 11 [IU] via SUBCUTANEOUS
  Administered 2014-03-25: 3 [IU] via SUBCUTANEOUS
  Administered 2014-03-26 (×2): 11 [IU] via SUBCUTANEOUS
  Administered 2014-03-26: 7 [IU] via SUBCUTANEOUS
  Administered 2014-03-27: 4 [IU] via SUBCUTANEOUS
  Administered 2014-03-27: 7 [IU] via SUBCUTANEOUS
  Administered 2014-03-27 – 2014-03-28 (×2): 4 [IU] via SUBCUTANEOUS
  Administered 2014-03-28: 11 [IU] via SUBCUTANEOUS

## 2014-03-23 MED ORDER — SODIUM CHLORIDE 0.9 % IJ SOLN: 3.0000 mL | INTRAMUSCULAR | Status: DC | PRN

## 2014-03-23 MED ORDER — ALPRAZOLAM 0.5 MG PO TABS
0.5000 mg | ORAL_TABLET | Freq: Three times a day (TID) | ORAL | Status: DC
  Administered 2014-03-23 – 2014-03-26 (×9): 0.5 mg via ORAL
  Administered 2014-03-26: 1 mg via ORAL
  Administered 2014-03-26: 0.5 mg via ORAL
  Administered 2014-03-27 – 2014-03-28 (×4): 1 mg via ORAL
  Filled 2014-03-23: qty 2
  Filled 2014-03-23 (×3): qty 1
  Filled 2014-03-23 (×2): qty 2
  Filled 2014-03-23 (×7): qty 1
  Filled 2014-03-23 (×2): qty 2

## 2014-03-23 MED ORDER — SODIUM CHLORIDE 0.9 % IJ SOLN
3.0000 mL | Freq: Two times a day (BID) | INTRAMUSCULAR | Status: DC
  Administered 2014-03-27: 3 mL via INTRAVENOUS

## 2014-03-23 MED ORDER — METHYLPREDNISOLONE SODIUM SUCC 125 MG IJ SOLR
125.0000 mg | Freq: Once | INTRAMUSCULAR | Status: AC
Start: 2014-03-23 — End: 2014-03-23
  Administered 2014-03-23: 125 mg via INTRAVENOUS
  Filled 2014-03-23: qty 2

## 2014-03-23 MED ORDER — ONDANSETRON HCL 4 MG/2ML IJ SOLN: 4.0000 mg | Freq: Four times a day (QID) | INTRAMUSCULAR | Status: DC | PRN

## 2014-03-23 MED ORDER — GUAIFENESIN ER 600 MG PO TB12
600.0000 mg | ORAL_TABLET | Freq: Two times a day (BID) | ORAL | Status: DC
  Administered 2014-03-23 – 2014-03-28 (×10): 600 mg via ORAL
  Filled 2014-03-23 (×10): qty 1

## 2014-03-23 MED ORDER — METHYLPREDNISOLONE SODIUM SUCC 125 MG IJ SOLR
80.0000 mg | Freq: Four times a day (QID) | INTRAMUSCULAR | Status: DC
  Administered 2014-03-23 – 2014-03-26 (×11): 80 mg via INTRAVENOUS
  Filled 2014-03-23 (×11): qty 2

## 2014-03-23 MED ORDER — LORATADINE 10 MG PO TABS
10.0000 mg | ORAL_TABLET | Freq: Every day | ORAL | Status: DC
  Administered 2014-03-23 – 2014-03-28 (×6): 10 mg via ORAL
  Filled 2014-03-23 (×6): qty 1

## 2014-03-23 MED ORDER — ALBUTEROL SULFATE (2.5 MG/3ML) 0.083% IN NEBU: 2.5000 mg | INHALATION_SOLUTION | RESPIRATORY_TRACT | Status: DC | PRN

## 2014-03-23 MED ORDER — FUROSEMIDE 40 MG PO TABS: 40.0000 mg | ORAL_TABLET | Freq: Two times a day (BID) | ORAL | Status: DC

## 2014-03-23 MED ORDER — BISACODYL 5 MG PO TBEC: 5.0000 mg | DELAYED_RELEASE_TABLET | Freq: Every day | ORAL | Status: DC | PRN

## 2014-03-23 MED ORDER — ONDANSETRON HCL 4 MG PO TABS: 4.0000 mg | ORAL_TABLET | Freq: Four times a day (QID) | ORAL | Status: DC | PRN

## 2014-03-23 MED ORDER — PANTOPRAZOLE SODIUM 40 MG PO TBEC
40.0000 mg | DELAYED_RELEASE_TABLET | Freq: Every day | ORAL | Status: DC
  Administered 2014-03-24 – 2014-03-28 (×5): 40 mg via ORAL
  Filled 2014-03-23 (×4): qty 1

## 2014-03-23 MED ORDER — IPRATROPIUM-ALBUTEROL 0.5-2.5 (3) MG/3ML IN SOLN
3.0000 mL | Freq: Once | RESPIRATORY_TRACT | Status: AC
Start: 2014-03-23 — End: 2014-03-23
  Administered 2014-03-23: 3 mL via RESPIRATORY_TRACT
  Filled 2014-03-23: qty 3

## 2014-03-23 MED ORDER — CITALOPRAM HYDROBROMIDE 20 MG PO TABS
20.0000 mg | ORAL_TABLET | Freq: Every day | ORAL | Status: DC
  Administered 2014-03-24 – 2014-03-28 (×5): 20 mg via ORAL
  Filled 2014-03-23 (×5): qty 1

## 2014-03-23 MED ORDER — HYDROCODONE-ACETAMINOPHEN 10-325 MG PO TABS
1.0000 | ORAL_TABLET | Freq: Four times a day (QID) | ORAL | Status: DC | PRN
  Administered 2014-03-24 – 2014-03-28 (×10): 1 via ORAL
  Filled 2014-03-23 (×11): qty 1

## 2014-03-23 MED ORDER — GABAPENTIN 100 MG PO CAPS
100.0000 mg | ORAL_CAPSULE | Freq: Three times a day (TID) | ORAL | Status: DC
  Administered 2014-03-23 – 2014-03-28 (×15): 100 mg via ORAL
  Filled 2014-03-23 (×15): qty 1

## 2014-03-23 MED ORDER — APIXABAN 5 MG PO TABS
5.0000 mg | ORAL_TABLET | Freq: Two times a day (BID) | ORAL | Status: DC
  Administered 2014-03-23 – 2014-03-28 (×10): 5 mg via ORAL
  Filled 2014-03-23 (×10): qty 1

## 2014-03-23 MED ORDER — FAMOTIDINE 20 MG PO TABS
20.0000 mg | ORAL_TABLET | Freq: Two times a day (BID) | ORAL | Status: DC
  Administered 2014-03-23 – 2014-03-24 (×2): 20 mg via ORAL
  Filled 2014-03-23 (×2): qty 1

## 2014-03-23 NOTE — ED Notes (Signed)
Pt admitted for COPD, was d/c last Friday. States has been no better. Pt still c/o sob, hurts back to cough hard. Pt color wnl. Sob noted, able to finish sentences. Accessory muscle use. 02 4L [place] at home continued in ED.

## 2014-03-23 NOTE — ED Notes (Signed)
Admitting MD at bedside.

## 2014-03-23 NOTE — H&P (Signed)
[facility] History and Physical  KEALA DRUM AVW:[id number] DOB: [birth year] DOA: 03/23/2014  Referring physician:  PCP: Glo Herring., MD   Chief Complaint: Worsening shortness of breath cough  HPI: Joanna Perez is a 58 y.o. female with a history of diastolic heart failure, COPD she is on 4 L of home oxygen, hypertension, hypothyroidism, atrial fibrillation, CAD with a history of MI, diabetes presents to the emergency department with chief complaint of worsening shortness of breath and cough. She reports that she was discharged from the hospital 10 days ago after a 2 day stay for acute on chronic respiratory failure related to COPD and CHF. She states she went back to her PCP yesterday for followup and he recommended she come to the emergency department. She states "he said he should not have let me go so early". She does report that she felt like her breathing was at baseline upon discharge. However since she's been at home her breathing has gotten gradually progressively worse with a moist productive cough. She denies fever chills vomiting diarrhea. She denies chest pain palpitations diaphoresis nausea vomiting. She does endorse some chronic back pain particularly when she coughs. Reports the pain in mid back and sharp particularly with coughing.  In addition she is experiencing some frequency and urgency with her bladder. He denies dysuria hematuria. She denies lower extremity edema but does indicate she sleeps with 3 pillows. States she's been in the hospital several times this month and before symptoms were related to "fluid buildup".   In the emergency department she is found to be in mild to moderate respiratory distress with oxygen saturation level of 89% on 4 L. Signs significant for blood pressure 151/88 and a heart rate of 108. She is afebrile. Chest x-ray yields chronic bronchitic changes without infiltrate. BNP is 369. Troponin negative. EKG yields sinus tachycardia at a  rate of 100.  In the emergency department she was provided with albuterol nebulizer as well as do a nabs and 125 mg of Solu-Medrol with little improvement. We are asked to admit  Review of Systems:  10 point review of systems complete and all systems are negative except as indicated in the history of present illness.   Past Medical History  Diagnosis Date  . Asthma   . COPD (chronic obstructive pulmonary disease)   . Hypothyroidism   . Essential hypertension, benign   . Hyperlipidemia   . Type 2 diabetes mellitus   . Atrial fibrillation   . MI (myocardial infarction)     2012  . Pericardial effusion 09/09/2013  . CHF (congestive heart failure)   . On home O2     4L N/C continuous   Past Surgical History  Procedure Laterality Date  . Hernia repair    . Abdominal hysterectomy    . Stomach surgery      Wound vac currently in place  . Esophagogastroduodenoscopy  11/11/2011    Procedure: ESOPHAGOGASTRODUODENOSCOPY (EGD);  Surgeon: Rogene Houston, MD;  Location: [facility];  Service: Endoscopy;  Laterality: N/A;  . Foreign body removal  11/11/2011    Procedure: FOREIGN BODY REMOVAL;  Surgeon: Rogene Houston, MD;  Location: [facility];  Service: Endoscopy;  Laterality: N/A;  . Tracheal surgery    . Abdominal surgery     Social History:  reports that she quit smoking about 3 months ago. Her smoking use included Cigarettes. She has a 52.5 pack-year smoking history. She has never used smokeless tobacco. She reports that  she drinks alcohol. She reports that she does not use illicit drugs.  Allergies  Allergen Reactions  . Amoxicillin Hives  . Bactrim [Sulfamethoxazole-Tmp Ds] Hives  . Erythromycin Hives  . Keflex [Cephalexin] Hives  . Penicillins Itching and Swelling    Sweating    Family History  Problem Relation Age of Onset  . Diabetes Mother      Prior to Admission medications   Medication Sig Start Date End Date Taking? Authorizing Provider  albuterol  (PROVENTIL HFA;VENTOLIN HFA) 108 (90 BASE) MCG/ACT inhaler Inhale 1 puff into the lungs every 4 (four) hours as needed for wheezing or shortness of breath.   Yes Historical Provider, MD  albuterol (PROVENTIL) (2.5 MG/3ML) 0.083% nebulizer solution Take 3 mLs (2.5 mg total) by nebulization every 6 (six) hours as needed for wheezing or shortness of breath. 01/22/14  Yes Kathie Dike, MD  ALPRAZolam Duanne Moron) 1 MG tablet Take 0.5-1 mg by mouth 3 (three) times daily.   Yes Historical Provider, MD  apixaban (ELIQUIS) 5 MG TABS tablet Take 1 tablet (5 mg total) by mouth 2 (two) times daily. 09/29/13  Yes Lendon Colonel, NP  Ascorbic Acid (VITAMIN C) 1000 MG tablet Take 1,000 mg by mouth daily.    Yes Historical Provider, MD  bisacodyl (DULCOLAX) 5 MG EC tablet Take 5 mg by mouth daily as needed for moderate constipation.   Yes Historical Provider, MD  budesonide-formoterol (SYMBICORT) 160-4.5 MCG/ACT inhaler Inhale 2 puffs into the lungs.    Yes Historical Provider, MD  citalopram (CELEXA) 20 MG tablet Take 20 mg by mouth daily.   Yes Historical Provider, MD  diltiazem (CARDIZEM CD) 240 MG 24 hr capsule Take 1 capsule (240 mg total) by mouth daily. 09/29/13  Yes Lendon Colonel, NP  fexofenadine (ALLEGRA) 180 MG tablet Take 180 mg by mouth 2 (two) times daily.   Yes Historical Provider, MD  fluticasone (FLONASE) 50 MCG/ACT nasal spray Place 1 spray into both nostrils.    Yes Historical Provider, MD  furosemide (LASIX) 20 MG tablet Take 2 tablets (40 mg total) by mouth daily. 01/22/14  Yes Lezlie Octave Trenisha Lafavor, NP  gabapentin (NEURONTIN) 100 MG capsule Take 100 mg by mouth 3 (three) times daily.  08/15/13 08/15/14 Yes Historical Provider, MD  guaiFENesin (MUCINEX) 600 MG 12 hr tablet Take 1 tablet (600 mg total) by mouth 2 (two) times daily. 03/15/14  Yes Oswald Hillock, MD  HYDROcodone-acetaminophen (NORCO) 10-325 MG per tablet Take 1 tablet by mouth every 12 (twelve) hours as needed for severe pain. 11/30/13  Yes  Thurnell Lose, MD  insulin glargine (LANTUS) 100 UNIT/ML injection Inject 0.1 mLs (10 Units total) into the skin at bedtime. 03/15/14  Yes Oswald Hillock, MD  levofloxacin (LEVAQUIN) 500 MG tablet Take 1 tablet (500 mg total) by mouth daily. 03/15/14  Yes Oswald Hillock, MD  levothyroxine (SYNTHROID, LEVOTHROID) 112 MCG tablet Take 112 mcg by mouth daily before breakfast.   Yes Historical Provider, MD  metFORMIN (GLUCOPHAGE) 500 MG tablet Take 1 tablet (500 mg total) by mouth 2 (two) times daily with a meal. 12/22/13  Yes Ripudeep K Rai, MD  pantoprazole (PROTONIX) 40 MG tablet Take 40 mg by mouth daily.    Yes Historical Provider, MD  predniSONE (DELTASONE) 10 MG tablet Prednisone 40 mg po daily x 2 day then Prednisone 30 mg po daily x 2 day then Prednisone 20 mg po daily x 2 day then Prednisone 10 mg daily x 2 day  then stop... 03/15/14  Yes Oswald Hillock, MD  ramipril (ALTACE) 5 MG capsule Take 1 capsule (5 mg total) by mouth daily. 01/22/14  Yes Lezlie Octave Kenedi Cilia, NP  simvastatin (ZOCOR) 40 MG tablet Take 40 mg by mouth daily at 6 PM.    Yes Historical Provider, MD  sodium chloride (OCEAN) 0.65 % SOLN nasal spray Place 1 spray into both nostrils as needed for congestion (also available OTC). 12/22/13  Yes Ripudeep Krystal Eaton, MD  theophylline (THEODUR) 200 MG 12 hr tablet Take 400 mg by mouth 2 (two) times daily.   Yes Historical Provider, MD  vitamin E 1000 UNIT capsule Take 1,000 Units by mouth daily.   Yes Historical Provider, MD   Physical Exam: Filed Vitals:   03/23/14 1532  BP: 130/56  Pulse: 99  Temp: 98.2 F (36.8 C)  Resp: 22    BP 130/56  Pulse 99  Temp(Src) 98.2 F (36.8 C) (Oral)  Resp 22  Ht 5\' 2"  (1.575 m)  SpO2 93%  General:  Obese somewhat anxious. appearing in mild distress Eyes: PERRL, normal lids, irises & conjunctiva ENT: grossly normal hearing, lips & tongue because membranes of her mouth are moist and pink. Oropharynx without erythema or exudate Neck: no LAD, masses or  thyromegaly Cardiovascular: Tachycardic but regular I hear no murmur no gallop no there is no lower extremity edema pedal pulses are present and palpable Telemetry: Sinus tachycardia, no arrhythmias  Respiratory: Moderate increased work of breathing with conversation. She has difficulty completing a sentence. There is a prolonged expiratory phase. Breath sounds are rhonchorous with faint expiratory wheeze throughout. Also faint crackle particularly left base. There is some use of her abdominal accessory muscles as well Abdomen: Obese soft positive bowel sounds throughout nontender to palpation. Hernia noted Skin: no rash or induration seen on limited exam Musculoskeletal: grossly normal tone BUE/BLE Psychiatric: grossly normal mood and affect, speech fluent and appropriate Neurologic: grossly non-focal.          Labs on Admission:  Basic Metabolic Panel:  Recent Labs Lab 03/23/14 1027  NA 139  K 4.4  CL 96  CO2 31  GLUCOSE 142*  BUN 17  CREATININE 0.67  CALCIUM 9.8   Liver Function Tests:  Recent Labs Lab 03/23/14 1046  AST 11  ALT 13  ALKPHOS 59  BILITOT 0.3  PROT 6.6  ALBUMIN 3.4*   No results found for this basename: LIPASE, AMYLASE,  in the last 168 hours No results found for this basename: AMMONIA,  in the last 168 hours CBC:  Recent Labs Lab 03/23/14 1027  WBC 10.2  NEUTROABS 8.2*  HGB 10.4*  HCT 33.8*  MCV 79.0  PLT 231   Cardiac Enzymes:  Recent Labs Lab 03/23/14 1046  TROPONINI <0.30    BNP (last 3 results)  Recent Labs  02/28/14 1931 03/13/14 1137 03/23/14 1046  PROBNP 784.0* 439.0* 369.0*   CBG: No results found for this basename: GLUCAP,  in the last 168 hours  Radiological Exams on Admission: Ct Angio Chest Pe W/cm &/or Wo Cm  03/23/2014   CLINICAL DATA:  Shortness of breath, hypoxia.  EXAM: CT ANGIOGRAPHY CHEST WITH CONTRAST  TECHNIQUE: Multidetector CT imaging of the chest was performed using the standard protocol during bolus  administration of intravenous contrast. Multiplanar CT image reconstructions and MIPs were obtained to evaluate the vascular anatomy.  CONTRAST:  170mL OMNIPAQUE IOHEXOL 350 MG/ML SOLN  COMPARISON:  08/27/2013  FINDINGS: Satisfactory opacification of pulmonary arteries noted,  and there is no evidence of pulmonary emboli. Adequate contrast opacification of the thoracic aorta with no evidence of dissection, aneurysm, or stenosis. There is bovine variant brachiocephalic arch anatomy without proximal stenosis. Patchy atheromatous plaque in the aortic arch and coronary arteries. No pleural or pericardial effusion. Interval improvement in previous pretracheal and right paratracheal adenopathy. No hilar adenopathy. Calcified posterior right pleural plaque as before. Lungs are clear. Visualized portions of upper abdomen unremarkable. New compression fracture deformity of T12 with approximately 50% loss of height anteriorly, no significant bony retropulsion or posterior element involvement. There is also mild superior endplate compression fracture deformity of T6 which has progressed since previous exam.  Review of the MIP images confirms the above findings.  IMPRESSION: 1. Negative for acute PE or thoracic aortic dissection. 2. New T6 and  T12 vertebral compression fracture deformities. 3. Atherosclerosis, including aortic and coronary artery disease. Please note that although the presence of coronary artery calcium documents the presence of coronary artery disease, the severity of this disease and any potential stenosis cannot be assessed on this non-gated CT examination. Assessment for potential risk factor modification, dietary therapy or pharmacologic therapy may be warranted, if clinically indicated.   Electronically Signed   By: Arne Cleveland M.D.   On: 03/23/2014 15:31   Dg Chest Portable 1 View  03/23/2014   CLINICAL DATA:  Shortness of breath, history asthma, COPD, essential hypertension, diabetes, coronary  artery disease post MI, atrial fibrillation, CHF  EXAM: PORTABLE CHEST - 1 VIEW  COMPARISON:  Portable exam 1036 hr compared to 03/13/2014  FINDINGS: Upper normal heart size.  Normal mediastinal contours and pulmonary vascularity.  Chronic bronchitic changes.  Slight chronic accentuation of interstitial markings similar to previous exam.  No definite acute infiltrate, pleural effusion or pneumothorax.  Bones demineralized.  Exam limited by body habitus.  IMPRESSION: Chronic bronchitic changes without acute infiltrate.   Electronically Signed   By: Lavonia Dana M.D.   On: 03/23/2014 10:57    EKG: Independently reviewed sinus tachycardia  Assessment/Plan Principal Problem:   Acute on chronic respiratory failure with hypoxia: Likely secondary to COPD exacerbation as well as mild CHF exacerbation. Will admit to telemetry. Continue oxygen supplementation to keep oxygen saturation levels above 92%. Will provide Solu-Medrol 80 mg every 6 hours, nebulizer treatments every 4 hours. Will give 1 dose of IV Lasix and resume home regimen. Continue theophylline at half home dose.  Patient with multiple admissions the past several months for same. Will obtain an ABG and CT angiogram the chest.  Active Problems:  COPD with exacerbation: See #1. Will obtain an ABG. Patient with history bronchitis and smoking. She quit 4 months ago. Her frequent admissions will also obtain a CT MG of the chest for thoroughness.   Chronic diastolic heart failure: Chest x-ray without any indication of pulmonary edema at this time. On exam fine crackles noted. Patient is on by mouth Lasix at home. Will give her one dose of intravenous Lasix on admission and resume home Lasix regimen. She does indicate that she had been on 40 mg of Lasix twice a day until about 3 months ago when this was changed to just daily. She denies any indication of fluid overload however 2 of her recent admission she did have edema on x-ray and elevated BNP's. Will  monitor intake and output and obtain daily weight    Atrial fibrillation: Rhythm sinus tachycardia at the time of my exam. Will continue her diltiazem. She is on our request  and we will continue this  Type 2 diabetes mellitus: Globin A1c 6.83 weeks ago. We will continue her home Lantus and use sliding scale insulin. Anticipate elevation of her glucose given steroids.  Compression fracture: new at T6 and t12 consistent with patient history. Pain medicine po. monitor    Essential hypertension, benign: Controlled in the emergency department. Cardizem, Lasix, Ramapril, will continue these.    HYPOTHYROIDISM: We'll obtain a TSH and continue her home medications. Home medications include      DEPRESSION: Patient very teary-eyed during exam. Will continue home Xanax. Continue Celexa as well.    BACK PAIN, CHRONIC: Appears to be stable at baseline. Continue home medications     Code Status: DNR Family Communication: none present Disposition Plan: home when ready  Time spent: 60  minutes  [facility] Pager [phone]

## 2014-03-23 NOTE — ED Notes (Signed)
Respiratory paged and at bedside.

## 2014-03-23 NOTE — ED Notes (Signed)
Respiratory at bedside.

## 2014-03-23 NOTE — ED Provider Notes (Signed)
CSN: [id number]     Arrival date & time 03/23/14  0938 History   This chart was scribed for Maudry Diego, MD by Allena Earing, ED Scribe. This patient was seen in room [facility] and the patient's care was started at 10:30 AM .    Chief Complaint  Patient presents with  . Shortness of Breath      Patient is a 58 y.o. female presenting with shortness of breath. The history is provided by the patient and a relative. No language interpreter was used.  Shortness of Breath Severity:  Moderate Onset quality:  Gradual Duration:  5 days Timing:  Rare Progression:  Worsening Chronicity:  Recurrent Relieved by:  Nothing Worsened by:  Nothing tried Ineffective treatments:  None tried Associated symptoms: cough   Associated symptoms: no abdominal pain, no chest pain, no headaches and no rash     HPI Comments: Joanna Perez is a 58 y.o. female with h/o COPD, CHF who presents to the Emergency Department complaining of worsening SOB. Pt reports associated chest congestion and COPD. She was in the hospital for 2 days last week, left and went to her PCP and was told to come back to the ED. She reports that she has been in and out of the hospital a lot recently.  Pt is on 4 litres of oxygen a day.  Pt also reports incontinence and back pain. She will "go the the bathroom, get back in bed and go all over herself".   Past Medical History  Diagnosis Date  . Asthma   . COPD (chronic obstructive pulmonary disease)   . Hypothyroidism   . Essential hypertension, benign   . Hyperlipidemia   . Type 2 diabetes mellitus   . Atrial fibrillation   . MI (myocardial infarction)     2012  . Pericardial effusion 09/09/2013  . CHF (congestive heart failure)   . On home O2     4L N/C continuous   Past Surgical History  Procedure Laterality Date  . Hernia repair    . Abdominal hysterectomy    . Stomach surgery      Wound vac currently in place  . Esophagogastroduodenoscopy  11/11/2011   Procedure: ESOPHAGOGASTRODUODENOSCOPY (EGD);  Surgeon: Rogene Houston, MD;  Location: [facility];  Service: Endoscopy;  Laterality: N/A;  . Foreign body removal  11/11/2011    Procedure: FOREIGN BODY REMOVAL;  Surgeon: Rogene Houston, MD;  Location: [facility];  Service: Endoscopy;  Laterality: N/A;  . Tracheal surgery    . Abdominal surgery     Family History  Problem Relation Age of Onset  . Diabetes Mother    History  Substance Use Topics  . Smoking status: Former Smoker -- 1.50 packs/day for 35 years    Types: Cigarettes    Quit date: 11/27/2013  . Smokeless tobacco: Never Used  . Alcohol Use: Yes     Comment: 1-2 beers nightly   OB History   Grav Para Term Preterm Abortions TAB SAB Ect Mult Living   2 2 2       1      Review of Systems  Constitutional: Negative for appetite change and fatigue.  HENT: Positive for congestion. Negative for ear discharge and sinus pressure.   Eyes: Negative for discharge.  Respiratory: Positive for cough and shortness of breath.   Cardiovascular: Negative for chest pain.  Gastrointestinal: Negative for abdominal pain and diarrhea.  Genitourinary: Negative for frequency and hematuria.  Musculoskeletal: Negative  for back pain.  Skin: Negative for rash.  Neurological: Negative for seizures and headaches.  Psychiatric/Behavioral: Negative for hallucinations.      Allergies  Amoxicillin; Bactrim; Erythromycin; Keflex; and Penicillins  Home Medications   Prior to Admission medications   Medication Sig Start Date End Date Taking? Authorizing Provider  albuterol (PROVENTIL HFA;VENTOLIN HFA) 108 (90 BASE) MCG/ACT inhaler Inhale 1 puff into the lungs every 4 (four) hours as needed for wheezing or shortness of breath.    Historical Provider, MD  albuterol (PROVENTIL) (2.5 MG/3ML) 0.083% nebulizer solution Take 3 mLs (2.5 mg total) by nebulization every 6 (six) hours as needed for wheezing or shortness of breath. 01/22/14   Kathie Dike,  MD  ALPRAZolam Duanne Moron) 1 MG tablet Take 0.5-1 mg by mouth 3 (three) times daily.    Historical Provider, MD  apixaban (ELIQUIS) 5 MG TABS tablet Take 1 tablet (5 mg total) by mouth 2 (two) times daily. 09/29/13   Lendon Colonel, NP  Ascorbic Acid (VITAMIN C) 1000 MG tablet Take 1,000 mg by mouth daily.     Historical Provider, MD  bisacodyl (DULCOLAX) 5 MG EC tablet Take 5 mg by mouth daily as needed for moderate constipation.    Historical Provider, MD  budesonide-formoterol (SYMBICORT) 160-4.5 MCG/ACT inhaler Inhale 2 puffs into the lungs.     Historical Provider, MD  citalopram (CELEXA) 20 MG tablet Take 20 mg by mouth daily.    Historical Provider, MD  diltiazem (CARDIZEM CD) 240 MG 24 hr capsule Take 1 capsule (240 mg total) by mouth daily. 09/29/13   Lendon Colonel, NP  fexofenadine (ALLEGRA) 180 MG tablet Take 180 mg by mouth 2 (two) times daily.    Historical Provider, MD  fluticasone (FLONASE) 50 MCG/ACT nasal spray Place 1 spray into both nostrils.     Historical Provider, MD  furosemide (LASIX) 20 MG tablet Take 2 tablets (40 mg total) by mouth daily. 01/22/14   Radene Gunning, NP  gabapentin (NEURONTIN) 100 MG capsule Take 100 mg by mouth 3 (three) times daily.  08/15/13 08/15/14  Historical Provider, MD  guaiFENesin (MUCINEX) 600 MG 12 hr tablet Take 1 tablet (600 mg total) by mouth 2 (two) times daily. 03/15/14   Oswald Hillock, MD  HYDROcodone-acetaminophen (NORCO) 10-325 MG per tablet Take 1 tablet by mouth every 12 (twelve) hours as needed for severe pain. 11/30/13   Thurnell Lose, MD  insulin glargine (LANTUS) 100 UNIT/ML injection Inject 0.1 mLs (10 Units total) into the skin at bedtime. 03/15/14   Oswald Hillock, MD  levofloxacin (LEVAQUIN) 500 MG tablet Take 1 tablet (500 mg total) by mouth daily. 03/15/14   Oswald Hillock, MD  levothyroxine (SYNTHROID, LEVOTHROID) 112 MCG tablet Take 112 mcg by mouth daily before breakfast.    Historical Provider, MD  metFORMIN (GLUCOPHAGE) 500 MG  tablet Take 1 tablet (500 mg total) by mouth 2 (two) times daily with a meal. 12/22/13   Ripudeep K Rai, MD  pantoprazole (PROTONIX) 40 MG tablet Take 40 mg by mouth daily.     Historical Provider, MD  predniSONE (DELTASONE) 10 MG tablet Prednisone 40 mg po daily x 2 day then Prednisone 30 mg po daily x 2 day then Prednisone 20 mg po daily x 2 day then Prednisone 10 mg daily x 2 day then stop... 03/15/14   Oswald Hillock, MD  ramipril (ALTACE) 5 MG capsule Take 1 capsule (5 mg total) by mouth daily. 01/22/14   Santiago Glad  Gaetano Net, NP  simvastatin (ZOCOR) 40 MG tablet Take 40 mg by mouth daily at 6 PM.     Historical Provider, MD  sodium chloride (OCEAN) 0.65 % SOLN nasal spray Place 1 spray into both nostrils as needed for congestion (also available OTC). 12/22/13   Ripudeep Krystal Eaton, MD  theophylline (THEODUR) 200 MG 12 hr tablet Take 400 mg by mouth 2 (two) times daily.    Historical Provider, MD  vitamin E 1000 UNIT capsule Take 1,000 Units by mouth daily.    Historical Provider, MD   BP 131/75  Pulse 103  Temp(Src) 98.8 F (37.1 C) (Oral)  Resp 20  SpO2 98% Physical Exam  Nursing note and vitals reviewed. Constitutional: She is oriented to person, place, and time. She appears well-developed.  HENT:  Head: Normocephalic.  Dry mucous membrane  Eyes: Conjunctivae and EOM are normal. No scleral icterus.  Neck: Neck supple. No thyromegaly present.  Cardiovascular: Normal rate and regular rhythm.  Exam reveals no gallop and no friction rub.   No murmur heard. Pulmonary/Chest: No stridor. She has no wheezes. She has no rales. She exhibits no tenderness.  Crackles bilaterally  Abdominal: She exhibits no distension. There is no tenderness. There is no rebound.  Musculoskeletal: Normal range of motion. She exhibits no edema.  Lumbar tenderness   Lymphadenopathy:    She has no cervical adenopathy.  Neurological: She is oriented to person, place, and time. She exhibits normal muscle tone. Coordination  normal.  Skin: No rash noted. No erythema.  Psychiatric: She has a normal mood and affect. Her behavior is normal.    ED Course  Procedures (including critical care time)  DIAGNOSTIC STUDIES: Oxygen Saturation is 98% on  4L [place], normal by my interpretation.    COORDINATION OF CARE:   10:37 AM-Discussed treatment plan which includes CXR, labs, EKG  with pt at bedside and pt agreed to plan.   Labs Review Labs Reviewed  CBC WITH DIFFERENTIAL - Abnormal; Notable for the following:    Hemoglobin 10.4 (*)    HCT 33.8 (*)    MCH 24.3 (*)    RDW 20.0 (*)    Neutrophils Relative % 80 (*)    Neutro Abs 8.2 (*)    All other components within normal limits  BASIC METABOLIC PANEL    Imaging Review No results found.   EKG Interpretation None     Ekg nl  Rate 100 MDM   Final diagnoses:  None      I personally performed the services described in this documentation, which was scribed in my presence. The recorded information has been reviewed and is accurate.  The chart was scribed for me under my direct supervision.  I personally performed the history, physical, and medical decision making and all procedures in the evaluation of this patient.Maudry Diego, MD 03/23/14 1344

## 2014-03-23 NOTE — H&P (Signed)
The patient was seen and examined. Her vital signs, laboratory studies, in chart were reviewed. She was discussed with nurse practitioner, Ms. Renard Hamper. Agree with her assessment and plan with additions/changes below. On exam, she has diffuse rhonchus wheezes, but she does not appear to be in extreme respiratory distress; cardiac exam reveals S1, S2, with borderline tachycardia; lower extremities without edema.  -Dependent COPD with bronchitic exacerbation. The patient has had frequent hospitalizations for COPD exacerbations. She appears to be on adequate outpatient medication regimen. Dr. Luan Pulling is her primary pulmonologist. The etiology of her frequent exacerbations it is unclear. CT angiogram of her chest reveals no PE and no infiltrates. She stopped smoking several months ago, but her long history has certainly taken its toll on her lungs. We'll continue IV Solu-Medrol, albuterol/Atrovent nebulizer, Symbicort, Levaquin, and theophylline (at half the dose until she is less tachycardic and then restart her usual home dose.) We'll consult Dr. Luan Pulling when he is available.  -Acute on chronic respiratory failure with hypoxia. The patient is on 4 L of nasal cannula oxygen chronically. Her ABG today on 03/23/14 reveals a pH of 7.37, PCO2 of 52, and PO2 of 56. We will titrate her oxygen supplementation accordingly. She does not appear to be retaining much CO2.  -Chronic diastolic heart failure. I do not believe there is acute diastolic heart failure as she has no significant peripheral edema and her chest x-ray/CT of the chest is without pulmonary edema. Nevertheless, she was given 1 dose of intravenous Lasix in the ED. We'll continue her home dose of oral Lasix (40 mg daily). Echo on 03/01/14 revealed an EF of 65-70% and borderline diastolic dysfunction.  -Atrial fibrillation. On exam, she is in sinus rhythm though mildly tachycardic. We'll continue diltiazem and Eliquis.  -Hypertension. Stable on diltiazem  and Ramipril.  -Hypothyroidism.  We'll continue Synthroid and order TSH.  -Chronic back pain/new compression fracture at T6 andT12. Her compression fractures are likely the consequence of chronic steroid therapy. We'll add calcium with vitamin D if she tolerates it.  -Type 2 diabetes mellitus. We'll continue home Lantus dose, but I anticipate having to increase it. Continue sliding scale NovoLog started. We'll titrate accordingly. Holding metformin 4048 hours due to to IV contrast given. Hemoglobin A1c ordered and pending.  -GI prophylaxis. We'll add twice a day dosing of Pepcid in light of chronic steroid therapy. We'll add TUMS as a calcium supplement.  -We'll add gentle IV fluids although she was given IV Lasix. This is for nephro protection.

## 2014-03-24 DIAGNOSIS — D649 Anemia, unspecified: Secondary | ICD-10-CM | POA: Diagnosis present

## 2014-03-24 DIAGNOSIS — E669 Obesity, unspecified: Secondary | ICD-10-CM | POA: Diagnosis present

## 2014-03-24 LAB — GLUCOSE, CAPILLARY
GLUCOSE-CAPILLARY: 175 mg/dL — AB (ref 70–99)
Glucose-Capillary: 280 mg/dL — ABNORMAL HIGH (ref 70–99)
Glucose-Capillary: 234 mg/dL — ABNORMAL HIGH (ref 70–99)
Glucose-Capillary: 335 mg/dL — ABNORMAL HIGH (ref 70–99)

## 2014-03-24 LAB — CBC
HEMATOCRIT: 32.5 % — AB (ref 36.0–46.0)
HEMOGLOBIN: 10.1 g/dL — AB (ref 12.0–15.0)
MCH: 24.5 pg — ABNORMAL LOW (ref 26.0–34.0)
MCHC: 31.1 g/dL (ref 30.0–36.0)
MCV: 78.9 fL (ref 78.0–100.0)
Platelets: 262 10*3/uL (ref 150–400)
RBC: 4.12 MIL/uL (ref 3.87–5.11)
RDW: 20 % — ABNORMAL HIGH (ref 11.5–15.5)
WBC: 8.5 10*3/uL (ref 4.0–10.5)

## 2014-03-24 LAB — BASIC METABOLIC PANEL
BUN: 21 mg/dL (ref 6–23)
CHLORIDE: 96 meq/L (ref 96–112)
CO2: 32 mEq/L (ref 19–32)
Calcium: 8.9 mg/dL (ref 8.4–10.5)
Creatinine, Ser: 0.55 mg/dL (ref 0.50–1.10)
Glucose, Bld: 311 mg/dL — ABNORMAL HIGH (ref 70–99)
POTASSIUM: 4.3 meq/L (ref 3.7–5.3)
Sodium: 138 mEq/L (ref 137–147)

## 2014-03-24 LAB — HEMOGLOBIN A1C
HEMOGLOBIN A1C: 7.2 % — AB (ref <5.7)
MEAN PLASMA GLUCOSE: 160 mg/dL — AB (ref <117)

## 2014-03-24 LAB — T4, FREE: Free T4: 1.38 ng/dL (ref 0.80–1.80)

## 2014-03-24 LAB — TSH: TSH: 0.222 u[IU]/mL — ABNORMAL LOW (ref 0.350–4.500)

## 2014-03-24 MED ORDER — INSULIN ASPART 100 UNIT/ML ~~LOC~~ SOLN
4.0000 [IU] | Freq: Three times a day (TID) | SUBCUTANEOUS | Status: DC
  Administered 2014-03-24 – 2014-03-25 (×3): 4 [IU] via SUBCUTANEOUS

## 2014-03-24 MED ORDER — INSULIN GLARGINE 100 UNIT/ML ~~LOC~~ SOLN
20.0000 [IU] | Freq: Every day | SUBCUTANEOUS | Status: DC
  Filled 2014-03-24 (×2): qty 0.2

## 2014-03-24 MED ORDER — CHOLECALCIFEROL 10 MCG (400 UNIT) PO TABS
400.0000 [IU] | ORAL_TABLET | Freq: Every day | ORAL | Status: DC
  Administered 2014-03-24 – 2014-03-28 (×5): 400 [IU] via ORAL
  Filled 2014-03-24 (×5): qty 1

## 2014-03-24 NOTE — Progress Notes (Signed)
[facility] PROGRESS NOTE  Joanna Perez:[id number] DOB: 1956/01/19 DOA: 03/23/2014 PCP: Joanna Herring., MD    Code Status: DO NOT RESUSCITATE Family Communication: Family not available Disposition Plan: Eventual discharge to home when medically improved and stable for discharge.   Consultants:  Pulmonology pending  Procedures:  None  Antibiotics:  Levaquin 03/23/14>>>  HPI/Subjective: The patient says that she is breathing more comfortably, but she is still chest congested. She denies chest pain.  Objective: Filed Vitals:   03/24/14 1332  BP: 116/62  Pulse: 110  Temp: 97.9 F (36.6 C)  Resp: 18   oxygen saturation on 4 L of oxygen-95%.  Intake/Output Summary (Last 24 hours) at 03/24/14 1455 Last data filed at 03/24/14 1200  Gross per 24 hour  Intake    720 ml  Output   3000 ml  Net  -2280 ml   Filed Weights   03/23/14 1532 03/24/14 0500  Weight: 87.272 kg (192 lb 6.4 oz) 88.769 kg (195 lb 11.2 oz)    Exam:   General: Obese, less anxious appearing 58 year old woman in no acute respiratory distress.  Cardiovascular: S1, S2, with mild tachycardia.  Respiratory: Scattered bilateral rhonchus wheezes.  Abdomen: Moderate size ventral hernia, soft, positive bowel sounds, nontender, nondistended.  Musculoskeletal: No pedal edema. No acute hot red joints.  Psychiatric/neurologic: She has a flat affect. Her speech is clear. She is alert and oriented x3. Cranial nerves II through XII are intact.   Data Reviewed: Basic Metabolic Panel:  Recent Labs Lab 03/23/14 1027 03/24/14 0602  NA 139 138  K 4.4 4.3  CL 96 96  CO2 31 32  GLUCOSE 142* 311*  BUN 17 21  CREATININE 0.67 0.55  CALCIUM 9.8 8.9   Liver Function Tests:  Recent Labs Lab 03/23/14 1046  AST 11  ALT 13  ALKPHOS 59  BILITOT 0.3  PROT 6.6  ALBUMIN 3.4*   No results found for this basename: LIPASE, AMYLASE,  in the last 168 hours No results found for this basename:  AMMONIA,  in the last 168 hours CBC:  Recent Labs Lab 03/23/14 1027 03/24/14 0602  WBC 10.2 8.5  NEUTROABS 8.2*  --   HGB 10.4* 10.1*  HCT 33.8* 32.5*  MCV 79.0 78.9  PLT 231 262   Cardiac Enzymes:  Recent Labs Lab 03/23/14 1046  TROPONINI <0.30   BNP (last 3 results)  Recent Labs  02/28/14 1931 03/13/14 1137 03/23/14 1046  PROBNP 784.0* 439.0* 369.0*   CBG:  Recent Labs Lab 03/23/14 1645 03/23/14 2100 03/24/14 0723 03/24/14 1152  GLUCAP 285* 303* 280* 234*    No results found for this or any previous visit (from the past 240 hour(s)).   Studies: Ct Angio Chest Pe W/cm &/or Wo Cm  03/23/2014   CLINICAL DATA:  Shortness of breath, hypoxia.  EXAM: CT ANGIOGRAPHY CHEST WITH CONTRAST  TECHNIQUE: Multidetector CT imaging of the chest was performed using the standard protocol during bolus administration of intravenous contrast. Multiplanar CT image reconstructions and MIPs were obtained to evaluate the vascular anatomy.  CONTRAST:  178mL OMNIPAQUE IOHEXOL 350 MG/ML SOLN  COMPARISON:  08/27/2013  FINDINGS: Satisfactory opacification of pulmonary arteries noted, and there is no evidence of pulmonary emboli. Adequate contrast opacification of the thoracic aorta with no evidence of dissection, aneurysm, or stenosis. There is bovine variant brachiocephalic arch anatomy without proximal stenosis. Patchy atheromatous plaque in the aortic arch and coronary arteries. No pleural or pericardial effusion. Interval improvement in previous pretracheal and  right paratracheal adenopathy. No hilar adenopathy. Calcified posterior right pleural plaque as before. Lungs are clear. Visualized portions of upper abdomen unremarkable. New compression fracture deformity of T12 with approximately 50% loss of height anteriorly, no significant bony retropulsion or posterior element involvement. There is also mild superior endplate compression fracture deformity of T6 which has progressed since previous  exam.  Review of the MIP images confirms the above findings.  IMPRESSION: 1. Negative for acute PE or thoracic aortic dissection. 2. New T6 and  T12 vertebral compression fracture deformities. 3. Atherosclerosis, including aortic and coronary artery disease. Please note that although the presence of coronary artery calcium documents the presence of coronary artery disease, the severity of this disease and any potential stenosis cannot be assessed on this non-gated CT examination. Assessment for potential risk factor modification, dietary therapy or pharmacologic therapy may be warranted, if clinically indicated.   Electronically Signed   By: Arne Cleveland M.D.   On: 03/23/2014 15:31   Dg Chest Portable 1 View  03/23/2014   CLINICAL DATA:  Shortness of breath, history asthma, COPD, essential hypertension, diabetes, coronary artery disease post MI, atrial fibrillation, CHF  EXAM: PORTABLE CHEST - 1 VIEW  COMPARISON:  Portable exam 1036 hr compared to 03/13/2014  FINDINGS: Upper normal heart size.  Normal mediastinal contours and pulmonary vascularity.  Chronic bronchitic changes.  Slight chronic accentuation of interstitial markings similar to previous exam.  No definite acute infiltrate, pleural effusion or pneumothorax.  Bones demineralized.  Exam limited by body habitus.  IMPRESSION: Chronic bronchitic changes without acute infiltrate.   Electronically Signed   By: Lavonia Dana M.D.   On: 03/23/2014 10:57    Scheduled Meds: . ALPRAZolam  0.5-1 mg Oral TID  . apixaban  5 mg Oral BID  . budesonide-formoterol  2 puff Inhalation BID  . calcium carbonate  1 tablet Oral BID  . citalopram  20 mg Oral Daily  . diltiazem  240 mg Oral Daily  . fluticasone  1 spray Each Nare Daily  . furosemide  40 mg Oral Daily  . gabapentin  100 mg Oral TID  . guaiFENesin  600 mg Oral BID  . insulin aspart  0-20 Units Subcutaneous TID WC  . insulin aspart  0-5 Units Subcutaneous QHS  . insulin glargine  10 Units  Subcutaneous QHS  . ipratropium-albuterol  3 mL Nebulization Q4H  . levofloxacin  750 mg Oral Daily  . levothyroxine  112 mcg Oral QAC breakfast  . loratadine  10 mg Oral Daily  . methylPREDNISolone (SOLU-MEDROL) injection  80 mg Intravenous Q6H  . pantoprazole  40 mg Oral Daily  . ramipril  5 mg Oral Daily  . simvastatin  40 mg Oral q1800  . sodium chloride  3 mL Intravenous Q12H  . sodium chloride  3 mL Intravenous Q12H  . theophylline  200 mg Oral Q12H   Continuous Infusions: . 0.9 % NaCl with KCl 20 mEq / L 75 mL/hr at 03/23/14 1811   Assessment and plan  Principal Problem:   Acute on chronic respiratory failure Active Problems:   COPD with exacerbation   HYPOTHYROIDISM   Type 2 diabetes mellitus   DEPRESSION   Essential hypertension, benign   BACK PAIN, CHRONIC   Atrial fibrillation   Chronic diastolic heart failure   COPD (chronic obstructive pulmonary disease)   Thoracic compression fracture   Ventral hernia   Obesity   -Oxygen dependent COPD with bronchitic exacerbation.  The patient has had frequent hospitalizations  for COPD exacerbations. She appears to be on adequate outpatient medication regimen. Dr. Luan Pulling is her primary pulmonologist. The etiology of her frequent exacerbations it is unclear. CT angiogram of her chest revealed no PE and no infiltrates. She stopped smoking several months ago, but her long history has certainly taken its toll on her lungs. We'll continue IV Solu-Medrol, albuterol/Atrovent nebulizer, Symbicort, Levaquin, and theophylline (at half the dose until she is less tachycardic and then restart her usual home dose.) We'll consult Dr. Luan Pulling when he is available.  -Acute on chronic respiratory failure with hypoxia.  The patient is on 4 L of nasal cannula oxygen chronically. Her ABG today on 03/23/14 revealed a pH of 7.37, PCO2 of 52, and PO2 of 56. We will titrate her oxygen supplementation accordingly. She does not appear to be retaining much  CO2.  -Chronic diastolic heart failure.  I do not believe there is acute diastolic heart failure as she has no significant peripheral edema and her chest x-ray/CT chest is without pulmonary edema. Nevertheless, she was given 1 dose of intravenous Lasix in the ED. We'll continue her home dose of oral Lasix (40 mg daily). Echo on 03/01/14 revealed an EF of 65-70% and borderline diastolic dysfunction.  -Atrial fibrillation.  On exam, she is in sinus rhythm though mildly tachycardic. We'll continue diltiazem and Eliquis.  -Hypertension.  Stable on diltiazem and Ramipril.  -Hypothyroidism.  We'll continue Synthroid. TSH/FT4 pending  -Chronic back pain/new compression fracture at T6 andT12.  Her compression fractures are likely the consequence of chronic steroid therapy. Tums and Vitamin D started.  -Type 2 diabetes mellitus.  We'll increase Lantus to 20 units at bedtime and increase sliding scale NovoLog to resistant scale.  Holding metformin 48 hours due to to IV contrast given. Hemoglobin A1c 7.2.  -GI prophylaxis.  Twice daily Pepcid added in light of chronic steroid therapy. TUMS added as a calcium supplement.   Status post gentle IV fluids 5/1-03/24/2014 for nephro protection in the setting of diabetes and IV contrast. Renal function within normal limits. Will taper down IV fluids.   Time spent: 35 minutes.    Rexene Alberts  [facility] Pager [phone]. If 7PM-7AM, please contact night-coverage at [email or web address], password [facility] 03/24/2014, 2:55 PM  LOS: 1 day

## 2014-03-25 LAB — GLUCOSE, CAPILLARY
GLUCOSE-CAPILLARY: 125 mg/dL — AB (ref 70–99)
Glucose-Capillary: 281 mg/dL — ABNORMAL HIGH (ref 70–99)
Glucose-Capillary: 305 mg/dL — ABNORMAL HIGH (ref 70–99)

## 2014-03-25 MED ORDER — INSULIN ASPART 100 UNIT/ML ~~LOC~~ SOLN
4.0000 [IU] | Freq: Three times a day (TID) | SUBCUTANEOUS | Status: DC
  Administered 2014-03-25 – 2014-03-26 (×2): 4 [IU] via SUBCUTANEOUS

## 2014-03-25 MED ORDER — INSULIN GLARGINE 100 UNIT/ML ~~LOC~~ SOLN
25.0000 [IU] | Freq: Two times a day (BID) | SUBCUTANEOUS | Status: DC
  Administered 2014-03-25: 25 [IU] via SUBCUTANEOUS
  Filled 2014-03-25 (×2): qty 0.25

## 2014-03-25 MED ORDER — SENNOSIDES-DOCUSATE SODIUM 8.6-50 MG PO TABS
1.0000 | ORAL_TABLET | Freq: Every day | ORAL | Status: DC
  Administered 2014-03-25: 1 via ORAL
  Filled 2014-03-25 (×2): qty 1

## 2014-03-25 MED ORDER — METFORMIN HCL 500 MG PO TABS
500.0000 mg | ORAL_TABLET | Freq: Two times a day (BID) | ORAL | Status: DC
  Administered 2014-03-26 – 2014-03-28 (×5): 500 mg via ORAL
  Filled 2014-03-25 (×6): qty 1

## 2014-03-25 NOTE — Progress Notes (Addendum)
[facility] PROGRESS NOTE  Joanna Perez ZDG:[id number] DOB: 05-18-1956 DOA: 03/23/2014 PCP: Glo Herring., MD    Code Status: DO NOT RESUSCITATE Family Communication: Family not available Disposition Plan: Eventual discharge to home when medically improved and stable for discharge.   Consultants:  Pulmonology pending  Procedures:  None  Antibiotics:  Levaquin 03/23/14>>>  HPI/Subjective: The patient says that she is breathing more comfortably. She feels less congested.  Objective: Filed Vitals:   03/25/14 1300  BP: 133/85  Pulse: 96  Temp: 97.7 F (36.5 C)  Resp: 16   oxygen saturation on 3 L oxygen-96%.  Intake/Output Summary (Last 24 hours) at 03/25/14 1551 Last data filed at 03/25/14 1200  Gross per 24 hour  Intake    240 ml  Output   4300 ml  Net  -4060 ml   Filed Weights   03/23/14 1532 03/24/14 0500 03/25/14 0458  Weight: 87.272 kg (192 lb 6.4 oz) 88.769 kg (195 lb 11.2 oz) 88.2 kg (194 lb 7.1 oz)    Exam:   General: Obese, less anxious appearing 58 year old woman in no acute respiratory distress.  Cardiovascular: S1, S2, with mild tachycardia.  Respiratory: Scattered crackles and wheezes, no rhonchi compared to yesterday.  Abdomen: Moderate size ventral hernia, soft, positive bowel sounds, nontender, nondistended.  Musculoskeletal: No pedal edema. No acute hot red joints.  Psychiatric/neurologic: She has a flat affect. Her speech is clear. She is alert and oriented x3. Cranial nerves II through XII are intact.   Data Reviewed: Basic Metabolic Panel:  Recent Labs Lab 03/23/14 1027 03/24/14 0602  NA 139 138  K 4.4 4.3  CL 96 96  CO2 31 32  GLUCOSE 142* 311*  BUN 17 21  CREATININE 0.67 0.55  CALCIUM 9.8 8.9   Liver Function Tests:  Recent Labs Lab 03/23/14 1046  AST 11  ALT 13  ALKPHOS 59  BILITOT 0.3  PROT 6.6  ALBUMIN 3.4*   No results found for this basename: LIPASE, AMYLASE,  in the last 168 hours No  results found for this basename: AMMONIA,  in the last 168 hours CBC:  Recent Labs Lab 03/23/14 1027 03/24/14 0602  WBC 10.2 8.5  NEUTROABS 8.2*  --   HGB 10.4* 10.1*  HCT 33.8* 32.5*  MCV 79.0 78.9  PLT 231 262   Cardiac Enzymes:  Recent Labs Lab 03/23/14 1046  TROPONINI <0.30   BNP (last 3 results)  Recent Labs  02/28/14 1931 03/13/14 1137 03/23/14 1046  PROBNP 784.0* 439.0* 369.0*   CBG:  Recent Labs Lab 03/24/14 1152 03/24/14 1649 03/24/14 2025 03/25/14 0759 03/25/14 1119  GLUCAP 234* 175* 335* 281* 305*    No results found for this or any previous visit (from the past 240 hour(s)).   Studies: No results found.  Scheduled Meds: . ALPRAZolam  0.5-1 mg Oral TID  . apixaban  5 mg Oral BID  . budesonide-formoterol  2 puff Inhalation BID  . calcium carbonate  1 tablet Oral BID  . cholecalciferol  400 Units Oral Daily  . citalopram  20 mg Oral Daily  . diltiazem  240 mg Oral Daily  . fluticasone  1 spray Each Nare Daily  . furosemide  40 mg Oral Daily  . gabapentin  100 mg Oral TID  . guaiFENesin  600 mg Oral BID  . insulin aspart  0-20 Units Subcutaneous TID WC  . insulin aspart  0-5 Units Subcutaneous QHS  . insulin aspart  4 Units Subcutaneous TID WC  .  insulin glargine  20 Units Subcutaneous QHS  . ipratropium-albuterol  3 mL Nebulization Q4H  . levofloxacin  750 mg Oral Daily  . levothyroxine  112 mcg Oral QAC breakfast  . loratadine  10 mg Oral Daily  . methylPREDNISolone (SOLU-MEDROL) injection  80 mg Intravenous Q6H  . pantoprazole  40 mg Oral Daily  . ramipril  5 mg Oral Daily  . senna-docusate  1 tablet Oral QHS  . simvastatin  40 mg Oral q1800  . sodium chloride  3 mL Intravenous Q12H  . sodium chloride  3 mL Intravenous Q12H  . theophylline  200 mg Oral Q12H   Continuous Infusions:   Assessment and plan  Principal Problem:   Acute on chronic respiratory failure Active Problems:   COPD with exacerbation   HYPOTHYROIDISM    Type 2 diabetes mellitus   DEPRESSION   Essential hypertension, benign   BACK PAIN, CHRONIC   Atrial fibrillation   Chronic diastolic heart failure   COPD (chronic obstructive pulmonary disease)   Thoracic compression fracture   Ventral hernia   Obesity   Anemia   -Oxygen dependent COPD with bronchitic exacerbation.  The patient has had frequent hospitalizations for COPD exacerbations. She appears to be on adequate outpatient medication regimen. Dr. Luan Pulling is her primary pulmonologist. The etiology of her frequent exacerbations is unclear. CT angiogram of her chest revealed no PE and no infiltrates. She stopped smoking several months ago, but her long history has certainly taken its toll on her lungs. We'll continue IV Solu-Medrol, albuterol/Atrovent nebulizers, Symbicort, Levaquin, and theophylline (at half the dose until she is less tachycardic and then restart her usual home dose.) We'll consult Dr. Luan Pulling when he is available.  -Acute on chronic respiratory failure with hypoxia.  Her oxygen saturations appear to be improving. She is now on 3 L and oxygenating in the mid to upper 90s. She is on 4 L of nasal cannula oxygen chronically. Her ABG today on 03/23/14 revealed a pH of 7.37, PCO2 of 52, and PO2 of 56. She does not appear to be retaining much CO2.  -Chronic diastolic heart failure.  I do not believe there was acute diastolic heart failure as she had no significant peripheral edema on admission and her chest x-ray/CT chest was without pulmonary edema. Nevertheless, she was given 1 dose of intravenous Lasix in the ED. We'll continue her home dose of oral Lasix (40 mg daily). Echo on 03/01/14 revealed an EF of 65-70% and borderline diastolic dysfunction.  -Atrial fibrillation.  On exam, she is in sinus rhythm though mildly tachycardic. We'll continue diltiazem and Eliquis.  -Hypertension.  Stable on diltiazem and Ramipril.  -Hypothyroidism.  Her TSH is slightly low, but her free T4 is  within normal limits. We'll continue Synthroid at the current dose, but she will need further outpatient monitoring. -Chronic back pain/new compression fracture at T6 andT12.  Her compression fractures are likely the consequence of chronic steroid therapy. Tums and Vitamin D were started.  -Type 2 diabetes mellitus.  Capillary blood glucose exacerbated by steroid therapy. We'll increase Lantus to 25 units and change it to twice a day instead of bedtime only. Continue sliding scale NovoLog to resistant scale and add additional mealtime coverage of NovoLog. Will restart metformin on 03/26/14 after holding it for 48 hours due to to IV contrast given. Hemoglobin A1c 7.2.  -Anemia Her anemia is chronic per chart review. We'll continue to monitor. Further outpatient evaluation will be deferred to her primary care physician. -  GI prophylaxis.  Twice daily Pepcid added in light of chronic steroid therapy. TUMS added as a calcium supplement.   Status post gentle IV fluids 5/1-03/24/2014 for nephro protection in the setting of diabetes and IV contrast. Renal function within normal limits. Will taper off IV fluids.   Time spent: 30 minutes.    Rexene Alberts  [facility] Pager [phone]. If 7PM-7AM, please contact night-coverage at [email or web address], password [facility] 03/25/2014, 3:51 PM  LOS: 2 days

## 2014-03-25 NOTE — Progress Notes (Signed)
Pt ambulated in halls with O2 3liters via nasal canula Pt went half way to nurses station then returned to room  O2 sat 92% after ambulation

## 2014-03-26 LAB — BASIC METABOLIC PANEL
BUN: 28 mg/dL — ABNORMAL HIGH (ref 6–23)
CO2: 35 mEq/L — ABNORMAL HIGH (ref 19–32)
Calcium: 10.2 mg/dL (ref 8.4–10.5)
Chloride: 91 mEq/L — ABNORMAL LOW (ref 96–112)
Creatinine, Ser: 0.52 mg/dL (ref 0.50–1.10)
Glucose, Bld: 298 mg/dL — ABNORMAL HIGH (ref 70–99)
POTASSIUM: 3.9 meq/L (ref 3.7–5.3)
SODIUM: 138 meq/L (ref 137–147)

## 2014-03-26 LAB — CBC
HEMATOCRIT: 38 % (ref 36.0–46.0)
Hemoglobin: 12.1 g/dL (ref 12.0–15.0)
MCH: 24.9 pg — ABNORMAL LOW (ref 26.0–34.0)
MCHC: 31.8 g/dL (ref 30.0–36.0)
MCV: 78.4 fL (ref 78.0–100.0)
Platelets: 301 10*3/uL (ref 150–400)
RBC: 4.85 MIL/uL (ref 3.87–5.11)
RDW: 20.1 % — AB (ref 11.5–15.5)
WBC: 12.1 10*3/uL — AB (ref 4.0–10.5)

## 2014-03-26 LAB — GLUCOSE, CAPILLARY
GLUCOSE-CAPILLARY: 289 mg/dL — AB (ref 70–99)
GLUCOSE-CAPILLARY: 275 mg/dL — AB (ref 70–99)
GLUCOSE-CAPILLARY: 264 mg/dL — AB (ref 70–99)
GLUCOSE-CAPILLARY: 236 mg/dL — AB (ref 70–99)
Glucose-Capillary: 203 mg/dL — ABNORMAL HIGH (ref 70–99)

## 2014-03-26 MED ORDER — METHYLPREDNISOLONE SODIUM SUCC 125 MG IJ SOLR
60.0000 mg | Freq: Three times a day (TID) | INTRAMUSCULAR | Status: DC
  Administered 2014-03-26 – 2014-03-28 (×7): 60 mg via INTRAVENOUS
  Filled 2014-03-26 (×7): qty 2

## 2014-03-26 MED ORDER — INSULIN GLARGINE 100 UNIT/ML ~~LOC~~ SOLN
25.0000 [IU] | Freq: Every day | SUBCUTANEOUS | Status: DC
  Administered 2014-03-26: 25 [IU] via SUBCUTANEOUS
  Filled 2014-03-26: qty 0.25

## 2014-03-26 MED ORDER — INSULIN ASPART 100 UNIT/ML ~~LOC~~ SOLN
6.0000 [IU] | Freq: Three times a day (TID) | SUBCUTANEOUS | Status: DC
  Administered 2014-03-26 – 2014-03-27 (×3): 6 [IU] via SUBCUTANEOUS

## 2014-03-26 NOTE — Progress Notes (Signed)
The patient was seen and examined. Her chart, vital signs, and laboratory studies were reviewed. She was discussed with nurse practitioner, Ms. Renard Hamper. Agree with her assessment and plan. Dr. Luan Pulling evaluation and recommendations are noted and appreciated.

## 2014-03-26 NOTE — Progress Notes (Signed)
Utilization Review Complete  

## 2014-03-26 NOTE — Progress Notes (Signed)
[facility] PROGRESS NOTE  Joanna Perez GMW:[id number] DOB: 24-Jun-1956 DOA: 03/23/2014 PCP: Glo Herring., MD  Assessment/Plan: -Oxygen dependent COPD with bronchitic exacerbation.  Some improvement today. The patient has had frequent hospitalizations for COPD exacerbations. She appears to be on adequate outpatient medication regimen. Dr. Luan Pulling is her primary pulmonologist. The etiology of her frequent exacerbations is unclear. CT angiogram of her chest revealed no PE and no infiltrates. She stopped smoking several months ago. We'll taper IV Solu-Medrol, continue albuterol/Atrovent nebulizers, Symbicort, Levaquin, and theophylline (at half the dose until she is less tachycardic and then restart her usual home dose.) Of note, she was on 10mg  prednisone chronically and PCP decreased to 5mg .  She was evaluated by Dr. Luan Pulling who opines baseline is severe COPD and added flutter valve.   -Acute on chronic respiratory failure with hypoxia.  Her oxygen saturations are 91% on 3L [place]. She is on 4 L of nasal cannula oxygen chronically. Her ABG  on 03/23/14 revealed a pH of 7.37, PCO2 of 52, and PO2 of 56. She does not appear to be retaining much CO2. Will mobilize some today and monitor respiratory effort and oxygen saturation level.  -Chronic diastolic heart failure.   volume status -8.1L. Weight 88.2kg today down from 89.7kg on admission.she does not appear volume overloaded.  No significant peripheral edema on admission and her chest x-ray/CT chest was without pulmonary edema. she was given 1 dose of intravenous Lasix in the ED. Continue her home dose of oral Lasix (40 mg daily). Echo on 03/01/14 revealed an EF of 65-70% and borderline diastolic dysfunction.  -Atrial fibrillation.  On exam, she is in sinus rhythm though mildly tachycardic. HR range 96-107. We'll continue diltiazem and Eliquis.  -Hypertension.  Stable on diltiazem and Ramipril.  -Hypothyroidism.  Her TSH is slightly low, but her  free T4 is within normal limits. We'll continue Synthroid at the current dose, but she will need further outpatient monitoring.  -Chronic back pain/new compression fracture at T6 andT12.  Her compression fractures are likely the consequence of chronic steroid therapy. Tums and Vitamin D were started.  -Type 2 diabetes mellitus.  Capillary blood glucose exacerbated by steroid therapy.CBG range 175-335.  Lantus decreased to 25 units at bedtime. Continue sliding scale NovoLog to resistant scale and add additional mealtime coverage of NovoLog. Metformin resumed today. Hemoglobin A1c 7.2.  -Anemia  Her anemia is chronic per chart review. We'll continue to monitor. Further outpatient evaluation will be deferred to her primary care physician.  -GI prophylaxis.  Twice daily Pepcid added in light of chronic steroid therapy. TUMS added as a calcium supplement  Code Status: DNR Family Communication: none present Disposition Plan: home when ready hopefully 24-48 hours   Consultants:  Dr. Luan Pulling pulmonology  Procedures:  none  Antibiotics:  03/23/14>>  HPI/Subjective: Sitting up in bed. Reports improved breathing. Denies pain/discomfort.   Objective: Filed Vitals:   03/26/14 0440  BP: 167/84  Pulse: 102  Temp: 97.8 F (36.6 C)  Resp: 16    Intake/Output Summary (Last 24 hours) at 03/26/14 0831 Last data filed at 03/26/14 4403  Gross per 24 hour  Intake    480 ml  Output   3300 ml  Net  -2820 ml   Filed Weights   03/24/14 0500 03/25/14 0458 03/26/14 0440  Weight: 88.769 kg (195 lb 11.2 oz) 88.2 kg (194 lb 7.1 oz) 88.5 kg (195 lb 1.7 oz)    Exam:   General:  Obese NAD  Cardiovascular: tachycardic  but regular. No MGR No LE edema  Respiratory: normal effort with conversation. Diffuse rhonchi but mild with faint expiratory wheeze. No crackles  Abdomen: obese soft +BS non-tender to palpation  Musculoskeletal: no clubbing or cyanosis. Joints without swelling/erythema    Data Reviewed: Basic Metabolic Panel:  Recent Labs Lab 03/23/14 1027 03/24/14 0602  NA 139 138  K 4.4 4.3  CL 96 96  CO2 31 32  GLUCOSE 142* 311*  BUN 17 21  CREATININE 0.67 0.55  CALCIUM 9.8 8.9   Liver Function Tests:  Recent Labs Lab 03/23/14 1046  AST 11  ALT 13  ALKPHOS 59  BILITOT 0.3  PROT 6.6  ALBUMIN 3.4*   No results found for this basename: LIPASE, AMYLASE,  in the last 168 hours No results found for this basename: AMMONIA,  in the last 168 hours CBC:  Recent Labs Lab 03/23/14 1027 03/24/14 0602  WBC 10.2 8.5  NEUTROABS 8.2*  --   HGB 10.4* 10.1*  HCT 33.8* 32.5*  MCV 79.0 78.9  PLT 231 262   Cardiac Enzymes:  Recent Labs Lab 03/23/14 1046  TROPONINI <0.30   BNP (last 3 results)  Recent Labs  02/28/14 1931 03/13/14 1137 03/23/14 1046  PROBNP 784.0* 439.0* 369.0*   CBG:  Recent Labs Lab 03/24/14 2025 03/25/14 0759 03/25/14 1119 03/25/14 1638 03/25/14 2131  GLUCAP 335* 281* 305* 125* 289*    No results found for this or any previous visit (from the past 240 hour(s)).   Studies: No results found.  Scheduled Meds: . ALPRAZolam  0.5-1 mg Oral TID  . apixaban  5 mg Oral BID  . budesonide-formoterol  2 puff Inhalation BID  . calcium carbonate  1 tablet Oral BID  . cholecalciferol  400 Units Oral Daily  . citalopram  20 mg Oral Daily  . diltiazem  240 mg Oral Daily  . fluticasone  1 spray Each Nare Daily  . furosemide  40 mg Oral Daily  . gabapentin  100 mg Oral TID  . guaiFENesin  600 mg Oral BID  . insulin aspart  0-20 Units Subcutaneous TID WC  . insulin aspart  0-5 Units Subcutaneous QHS  . insulin aspart  4 Units Subcutaneous TID WC  . insulin glargine  25 Units Subcutaneous QHS  . ipratropium-albuterol  3 mL Nebulization Q4H  . levofloxacin  750 mg Oral Daily  . levothyroxine  112 mcg Oral QAC breakfast  . loratadine  10 mg Oral Daily  . metFORMIN  500 mg Oral BID WC  . methylPREDNISolone (SOLU-MEDROL)  injection  80 mg Intravenous Q6H  . pantoprazole  40 mg Oral Daily  . ramipril  5 mg Oral Daily  . senna-docusate  1 tablet Oral QHS  . simvastatin  40 mg Oral q1800  . sodium chloride  3 mL Intravenous Q12H  . sodium chloride  3 mL Intravenous Q12H  . theophylline  200 mg Oral Q12H   Continuous Infusions:   Principal Problem:   Acute on chronic respiratory failure Active Problems:   HYPOTHYROIDISM   Type 2 diabetes mellitus   DEPRESSION   Essential hypertension, benign   BACK PAIN, CHRONIC   Atrial fibrillation   Chronic diastolic heart failure   COPD with exacerbation   COPD (chronic obstructive pulmonary disease)   Thoracic compression fracture   Ventral hernia   Obesity   Anemia    Time spent: 35 minutes    [facility] Pager [phone]. If 7PM-7AM, please  contact night-coverage at [email or web address], password [facility] 03/26/2014, 8:31 AM  LOS: 3 days

## 2014-03-26 NOTE — Consult Note (Signed)
Consult requested by: Dr. Caryn Section Consult requested for COPD exacerbation:  HPI: this is a 58 year old with severe COPD she has been in and out of the hospital on multiple occasions with COPD exacerbations. She was hospitalized last month and discharged after she had improved but fairly rapidly deteriorated after that and ended up having to come back to the hospital. She says she's feeling better but still weak still coughing and still congested. She's having difficulty coughing anything up.  Past Medical History  Diagnosis Date  . Asthma   . COPD (chronic obstructive pulmonary disease)   . Hypothyroidism   . Essential hypertension, benign   . Hyperlipidemia   . Type 2 diabetes mellitus   . Atrial fibrillation   . MI (myocardial infarction)     2012  . Pericardial effusion 09/09/2013  . CHF (congestive heart failure)   . On home O2     4L [place] continuous  . Thoracic compression fracture 03/23/2014  . Ventral hernia      Family History  Problem Relation Age of Onset  . Diabetes Mother      History   Social History  . Marital Status: Legally Separated    Spouse Name: N/A    Number of Children: N/A  . Years of Education: N/A   Social History Main Topics  . Smoking status: Former Smoker -- 1.50 packs/day for 35 years    Types: Cigarettes    Quit date: 11/27/2013  . Smokeless tobacco: Never Used  . Alcohol Use: Yes     Comment: 1-2 beers nightly  . Drug Use: No  . Sexual Activity: No   Other Topics Concern  . None   Social History Narrative  . None     ROS: She has not had any chest pain hemoptysis fever chills nausea or vomiting    Objective: Vital signs in last 24 hours: Temp:  [97.5 F (36.4 C)-97.8 F (36.6 C)] 97.8 F (36.6 C) (05/04 0440) Pulse Rate:  [96-107] 102 (05/04 0440) Resp:  [16] 16 (05/04 0440) BP: (133-167)/(76-85) 167/84 mmHg (05/04 0440) SpO2:  [91 %-97 %] 91 % (05/04 0718) Weight:  [88.5 kg (195 lb 1.7 oz)] 88.5 kg (195 lb 1.7 oz)  (05/04 0440) Weight change: 0.3 kg (10.6 oz) Last BM Date: 03/22/14  Intake/Output from previous day: 05/03 0701 - 05/04 0700 In: 480 [P.O.:480] Out: 3300 [Urine:3300]  PHYSICAL EXAM She is awake and alert. Her mucous membranes are moist. Her neck is supple without masses. The rest of her HEENT exam is generally unremarkable. Her chest shows rhonchi bilaterally. There is no wheezes. Her heart is regular without gallop. Her abdomen is soft without masses. She does not have any peripheral edema. Central nervous system examination is grossly intact  Lab Results: Basic Metabolic Panel:  Recent Labs  03/23/14 1027 03/24/14 0602  NA 139 138  K 4.4 4.3  CL 96 96  CO2 31 32  GLUCOSE 142* 311*  BUN 17 21  CREATININE 0.67 0.55  CALCIUM 9.8 8.9   Liver Function Tests:  Recent Labs  03/23/14 1046  AST 11  ALT 13  ALKPHOS 59  BILITOT 0.3  PROT 6.6  ALBUMIN 3.4*   No results found for this basename: LIPASE, AMYLASE,  in the last 72 hours No results found for this basename: AMMONIA,  in the last 72 hours CBC:  Recent Labs  03/23/14 1027 03/24/14 0602  WBC 10.2 8.5  NEUTROABS 8.2*  --   HGB 10.4* 10.1*  HCT 33.8* 32.5*  MCV 79.0 78.9  PLT 231 262   Cardiac Enzymes:  Recent Labs  03/23/14 1046  TROPONINI <0.30   BNP:  Recent Labs  03/23/14 1046  PROBNP 369.0*   D-Dimer: No results found for this basename: DDIMER,  in the last 72 hours CBG:  Recent Labs  03/24/14 1152 03/24/14 1649 03/24/14 2025 03/25/14 0759 03/25/14 1119 03/25/14 1638  GLUCAP 234* 175* 335* 281* 305* 125*   Hemoglobin A1C:  Recent Labs  03/23/14 1541  HGBA1C 7.2*   Fasting Lipid Panel: No results found for this basename: CHOL, HDL, LDLCALC, TRIG, CHOLHDL, LDLDIRECT,  in the last 72 hours Thyroid Function Tests:  Recent Labs  03/24/14 0602  TSH 0.222*  FREET4 1.38   Anemia Panel: No results found for this basename: VITAMINB12, FOLATE, FERRITIN, TIBC, IRON,  RETICCTPCT,  in the last 72 hours Coagulation: No results found for this basename: LABPROT, INR,  in the last 72 hours Urine Drug Screen: Drugs of Abuse  No results found for this basename: labopia, cocainscrnur, labbenz, amphetmu, thcu, labbarb    Alcohol Level: No results found for this basename: ETH,  in the last 72 hours Urinalysis: No results found for this basename: COLORURINE, APPERANCEUR, LABSPEC, PHURINE, GLUCOSEU, HGBUR, BILIRUBINUR, KETONESUR, PROTEINUR, UROBILINOGEN, NITRITE, LEUKOCYTESUR,  in the last 72 hours Misc. Labs:   ABGS:  Recent Labs  03/23/14 1430  PHART 7.369  PO2ART 56.0*  TCO2 27.3  HCO3 29.5*     MICROBIOLOGY: No results found for this or any previous visit (from the past 240 hour(s)).  Studies/Results: No results found.  Medications:  Prior to Admission:  Prescriptions prior to admission  Medication Sig Dispense Refill  . albuterol (PROVENTIL HFA;VENTOLIN HFA) 108 (90 BASE) MCG/ACT inhaler Inhale 1 puff into the lungs every 4 (four) hours as needed for wheezing or shortness of breath.      Marland Kitchen albuterol (PROVENTIL) (2.5 MG/3ML) 0.083% nebulizer solution Take 3 mLs (2.5 mg total) by nebulization every 6 (six) hours as needed for wheezing or shortness of breath.  75 mL  12  . ALPRAZolam (XANAX) 1 MG tablet Take 0.5-1 mg by mouth 3 (three) times daily.      Marland Kitchen apixaban (ELIQUIS) 5 MG TABS tablet Take 1 tablet (5 mg total) by mouth 2 (two) times daily.  60 tablet  6  . Ascorbic Acid (VITAMIN C) 1000 MG tablet Take 1,000 mg by mouth daily.       . bisacodyl (DULCOLAX) 5 MG EC tablet Take 5 mg by mouth daily as needed for moderate constipation.      . budesonide-formoterol (SYMBICORT) 160-4.5 MCG/ACT inhaler Inhale 2 puffs into the lungs.       . citalopram (CELEXA) 20 MG tablet Take 20 mg by mouth daily.      Marland Kitchen diltiazem (CARDIZEM CD) 240 MG 24 hr capsule Take 1 capsule (240 mg total) by mouth daily.  30 capsule  6  . fexofenadine (ALLEGRA) 180 MG  tablet Take 180 mg by mouth 2 (two) times daily.      . fluticasone (FLONASE) 50 MCG/ACT nasal spray Place 1 spray into both nostrils.       . furosemide (LASIX) 20 MG tablet Take 2 tablets (40 mg total) by mouth daily.  30 tablet  0  . gabapentin (NEURONTIN) 100 MG capsule Take 100 mg by mouth 3 (three) times daily.       Marland Kitchen guaiFENesin (MUCINEX) 600 MG 12 hr tablet Take 1 tablet (600  mg total) by mouth 2 (two) times daily.  20 tablet  0  . HYDROcodone-acetaminophen (NORCO) 10-325 MG per tablet Take 1 tablet by mouth every 12 (twelve) hours as needed for severe pain.  30 tablet  0  . insulin glargine (LANTUS) 100 UNIT/ML injection Inject 0.1 mLs (10 Units total) into the skin at bedtime.  10 mL  11  . levofloxacin (LEVAQUIN) 500 MG tablet Take 1 tablet (500 mg total) by mouth daily.  7 tablet  0  . levothyroxine (SYNTHROID, LEVOTHROID) 112 MCG tablet Take 112 mcg by mouth daily before breakfast.      . metFORMIN (GLUCOPHAGE) 500 MG tablet Take 1 tablet (500 mg total) by mouth 2 (two) times daily with a meal.  60 tablet  3  . pantoprazole (PROTONIX) 40 MG tablet Take 40 mg by mouth daily.       . predniSONE (DELTASONE) 10 MG tablet Prednisone 40 mg po daily x 2 day then Prednisone 30 mg po daily x 2 day then Prednisone 20 mg po daily x 2 day then Prednisone 10 mg daily x 2 day then stop...  30 tablet  0  . ramipril (ALTACE) 5 MG capsule Take 1 capsule (5 mg total) by mouth daily.  30 capsule  1  . simvastatin (ZOCOR) 40 MG tablet Take 40 mg by mouth daily at 6 PM.       . sodium chloride (OCEAN) 0.65 % SOLN nasal spray Place 1 spray into both nostrils as needed for congestion (also available OTC).  30 mL  2  . theophylline (THEODUR) 200 MG 12 hr tablet Take 400 mg by mouth 2 (two) times daily.      . vitamin E 1000 UNIT capsule Take 1,000 Units by mouth daily.       Scheduled: . ALPRAZolam  0.5-1 mg Oral TID  . apixaban  5 mg Oral BID  . budesonide-formoterol  2 puff Inhalation BID  . calcium  carbonate  1 tablet Oral BID  . cholecalciferol  400 Units Oral Daily  . citalopram  20 mg Oral Daily  . diltiazem  240 mg Oral Daily  . fluticasone  1 spray Each Nare Daily  . furosemide  40 mg Oral Daily  . gabapentin  100 mg Oral TID  . guaiFENesin  600 mg Oral BID  . insulin aspart  0-20 Units Subcutaneous TID WC  . insulin aspart  0-5 Units Subcutaneous QHS  . insulin aspart  4 Units Subcutaneous TID WC  . insulin glargine  25 Units Subcutaneous QHS  . ipratropium-albuterol  3 mL Nebulization Q4H  . levofloxacin  750 mg Oral Daily  . levothyroxine  112 mcg Oral QAC breakfast  . loratadine  10 mg Oral Daily  . metFORMIN  500 mg Oral BID WC  . methylPREDNISolone (SOLU-MEDROL) injection  80 mg Intravenous Q6H  . pantoprazole  40 mg Oral Daily  . ramipril  5 mg Oral Daily  . senna-docusate  1 tablet Oral QHS  . simvastatin  40 mg Oral q1800  . sodium chloride  3 mL Intravenous Q12H  . sodium chloride  3 mL Intravenous Q12H  . theophylline  200 mg Oral Q12H   Continuous:  ONG:EXBMWU chloride, albuterol, alum & mag hydroxide-simeth, bisacodyl, bisacodyl, HYDROcodone-acetaminophen, ondansetron (ZOFRAN) IV, ondansetron, senna-docusate, sodium chloride, sodium chloride  Assesment: She was admitted with acute on chronic respiratory failure. She has severe COPD. She's had trouble with anxiety and depression. She seems to be improving. She is  pretty severely symptomatic at baseline. She would like to have a portable oxygen concentrator. Principal Problem:   Acute on chronic respiratory failure Active Problems:   HYPOTHYROIDISM   Type 2 diabetes mellitus   DEPRESSION   Essential hypertension, benign   BACK PAIN, CHRONIC   Atrial fibrillation   Chronic diastolic heart failure   COPD with exacerbation   COPD (chronic obstructive pulmonary disease)   Thoracic compression fracture   Ventral hernia   Obesity   Anemia    Plan: I will add a flutter valve to see if it will help with  her ability to cough up any sputum. She will continue everything else.  Thanks for allow me to see her with you    LOS: 3 days   Alonza Bogus 03/26/2014, 8:18 AM

## 2014-03-26 NOTE — Care Management Note (Signed)
    Page 1 of 1   03/28/2014     2:18:53 PM CARE MANAGEMENT NOTE 03/28/2014  Patient:  DOLORIS, SERVANTES   Account Number:  [id number]  Date Initiated:  03/26/2014  Documentation initiated by:  Claretha Cooper  Subjective/Objective Assessment:   Pt admitted from home. Has chronice O2 with [facility] and wishes to discuss portable O2. Emma with [facility] will meet with pt.     Action/Plan:   Anticipated DC Date:     Anticipated DC Plan:  [place]  CM consult      [facility] Choice  Resumption Of Svcs/PTA Provider   Choice offered to / List presented to:          [facility] arranged  HH-1 RN  [place].   Status of service:  In process, will continue to follow Medicare Important Message given?   (If response is "NO", the following Medicare IM given date fields will be blank) Date Medicare IM given:   Date Additional Medicare IM given:    Discharge Disposition:  [place]  Per UR Regulation:    If discussed at Long Length of Stay Meetings, dates discussed:    Comments:  03/26/14 Claretha Cooper RN BSN CM

## 2014-03-27 LAB — CBC
HCT: 37.6 % (ref 36.0–46.0)
Hemoglobin: 11.8 g/dL — ABNORMAL LOW (ref 12.0–15.0)
MCH: 24.8 pg — AB (ref 26.0–34.0)
MCHC: 31.4 g/dL (ref 30.0–36.0)
MCV: 79.2 fL (ref 78.0–100.0)
PLATELETS: 313 10*3/uL (ref 150–400)
RBC: 4.75 MIL/uL (ref 3.87–5.11)
RDW: 20.1 % — AB (ref 11.5–15.5)
WBC: 13.1 10*3/uL — ABNORMAL HIGH (ref 4.0–10.5)

## 2014-03-27 LAB — GLUCOSE, CAPILLARY
GLUCOSE-CAPILLARY: 198 mg/dL — AB (ref 70–99)
Glucose-Capillary: 215 mg/dL — ABNORMAL HIGH (ref 70–99)
Glucose-Capillary: 195 mg/dL — ABNORMAL HIGH (ref 70–99)
Glucose-Capillary: 184 mg/dL — ABNORMAL HIGH (ref 70–99)

## 2014-03-27 LAB — BASIC METABOLIC PANEL
BUN: 34 mg/dL — ABNORMAL HIGH (ref 6–23)
CALCIUM: 10 mg/dL (ref 8.4–10.5)
CO2: 35 mEq/L — ABNORMAL HIGH (ref 19–32)
Chloride: 91 mEq/L — ABNORMAL LOW (ref 96–112)
Creatinine, Ser: 0.66 mg/dL (ref 0.50–1.10)
GFR calc non Af Amer: >90 mL/min (ref >90)
GLUCOSE: 244 mg/dL — AB (ref 70–99)
POTASSIUM: 3.6 meq/L — AB (ref 3.7–5.3)
SODIUM: 140 meq/L (ref 137–147)

## 2014-03-27 MED ORDER — INSULIN GLARGINE 100 UNIT/ML ~~LOC~~ SOLN
30.0000 [IU] | Freq: Every day | SUBCUTANEOUS | Status: DC
  Administered 2014-03-27: 30 [IU] via SUBCUTANEOUS
  Filled 2014-03-27 (×2): qty 0.3

## 2014-03-27 MED ORDER — INSULIN ASPART 100 UNIT/ML ~~LOC~~ SOLN: 30.0000 [IU] | Freq: Three times a day (TID) | SUBCUTANEOUS | Status: DC

## 2014-03-27 MED ORDER — POTASSIUM CHLORIDE CRYS ER 20 MEQ PO TBCR
40.0000 meq | EXTENDED_RELEASE_TABLET | Freq: Once | ORAL | Status: AC
  Administered 2014-03-27: 40 meq via ORAL
  Filled 2014-03-27: qty 2

## 2014-03-27 MED ORDER — INSULIN ASPART 100 UNIT/ML ~~LOC~~ SOLN
7.0000 [IU] | Freq: Three times a day (TID) | SUBCUTANEOUS | Status: DC
  Administered 2014-03-27 – 2014-03-28 (×4): 7 [IU] via SUBCUTANEOUS

## 2014-03-27 NOTE — Progress Notes (Signed)
The patient was seen and examined. Her chart, vital signs, laboratory studies were reviewed. She was discussed with nurse practitioner, Ms. Renard Hamper. Agree with findings and plan. However, per my exam, the patient has more rhonchus wheezes. She acknowledges a little bit more chest congestion compared to yesterday. She is coughing, but is unable to expectorate. Her only complaint now is of loose stools. We'll therefore discontinue Senokot S.  I thought that the patient would be ready for hospital discharge either today or tomorrow, but in light of her ongoing pulmonary symptoms, would recommend taking it day to day and assess her for symptomatic relief and stability for hospital discharge. Her progress has been slow. Appreciate input from Dr. Luan Pulling.

## 2014-03-27 NOTE — Progress Notes (Signed)
Subjective: She says she feels some better but is still quite congested. She's coughing but not coughing much up.  Objective: Vital signs in last 24 hours: Temp:  [97.7 F (36.5 C)-98.2 F (36.8 C)] 97.7 F (36.5 C) (05/04 2017) Pulse Rate:  [95-110] 110 (05/05 0456) Resp:  [20] 20 (05/05 0456) BP: (126-148)/(70-76) 148/76 mmHg (05/05 0456) SpO2:  [84 %-98 %] 90 % (05/05 0707) Weight:  [87.8 kg (193 lb 9 oz)] 87.8 kg (193 lb 9 oz) (05/05 0456) Weight change: -0.7 kg (-1 lb 8.7 oz) Last BM Date: 03/26/14  Intake/Output from previous day: 05/04 0701 - 05/05 0700 In: 120 [P.O.:120] Out: 1101 [Urine:1100; Stool:1]  PHYSICAL EXAM General appearance: alert and mild distress Resp: rhonchi bilaterally Cardio: regular rate and rhythm, S1, S2 normal, no murmur, click, rub or gallop GI: soft, non-tender; bowel sounds normal; no masses,  no organomegaly Extremities: extremities normal, atraumatic, no cyanosis or edema  Lab Results:    Basic Metabolic Panel:  Recent Labs  03/26/14 0809 03/27/14 0533  NA 138 140  K 3.9 3.6*  CL 91* 91*  CO2 35* 35*  GLUCOSE 298* 244*  BUN 28* 34*  CREATININE 0.52 0.66  CALCIUM 10.2 10.0   Liver Function Tests: No results found for this basename: AST, ALT, ALKPHOS, BILITOT, PROT, ALBUMIN,  in the last 72 hours No results found for this basename: LIPASE, AMYLASE,  in the last 72 hours No results found for this basename: AMMONIA,  in the last 72 hours CBC:  Recent Labs  03/26/14 0809 03/27/14 0533  WBC 12.1* 13.1*  HGB 12.1 11.8*  HCT 38.0 37.6  MCV 78.4 79.2  PLT 301 313   Cardiac Enzymes: No results found for this basename: CKTOTAL, CKMB, CKMBINDEX, TROPONINI,  in the last 72 hours BNP: No results found for this basename: PROBNP,  in the last 72 hours D-Dimer: No results found for this basename: DDIMER,  in the last 72 hours CBG:  Recent Labs  03/25/14 2131 03/26/14 0826 03/26/14 1219 03/26/14 1636 03/26/14 2017  03/27/14 0756  GLUCAP 289* 275* 264* 236* 203* 215*   Hemoglobin A1C: No results found for this basename: HGBA1C,  in the last 72 hours Fasting Lipid Panel: No results found for this basename: CHOL, HDL, LDLCALC, TRIG, CHOLHDL, LDLDIRECT,  in the last 72 hours Thyroid Function Tests: No results found for this basename: TSH, T4TOTAL, FREET4, T3FREE, THYROIDAB,  in the last 72 hours Anemia Panel: No results found for this basename: VITAMINB12, FOLATE, FERRITIN, TIBC, IRON, RETICCTPCT,  in the last 72 hours Coagulation: No results found for this basename: LABPROT, INR,  in the last 72 hours Urine Drug Screen: Drugs of Abuse  No results found for this basename: labopia, cocainscrnur, labbenz, amphetmu, thcu, labbarb    Alcohol Level: No results found for this basename: ETH,  in the last 72 hours Urinalysis: No results found for this basename: COLORURINE, APPERANCEUR, LABSPEC, PHURINE, GLUCOSEU, HGBUR, BILIRUBINUR, KETONESUR, PROTEINUR, UROBILINOGEN, NITRITE, LEUKOCYTESUR,  in the last 72 hours Misc. Labs:  ABGS No results found for this basename: PHART, PCO2, PO2ART, TCO2, HCO3,  in the last 72 hours CULTURES No results found for this or any previous visit (from the past 240 hour(s)). Studies/Results: No results found.  Medications:  Prior to Admission:  Prescriptions prior to admission  Medication Sig Dispense Refill  . albuterol (PROVENTIL HFA;VENTOLIN HFA) 108 (90 BASE) MCG/ACT inhaler Inhale 1 puff into the lungs every 4 (four) hours as needed for wheezing or shortness  of breath.      Marland Kitchen albuterol (PROVENTIL) (2.5 MG/3ML) 0.083% nebulizer solution Take 3 mLs (2.5 mg total) by nebulization every 6 (six) hours as needed for wheezing or shortness of breath.  75 mL  12  . ALPRAZolam (XANAX) 1 MG tablet Take 0.5-1 mg by mouth 3 (three) times daily.      Marland Kitchen apixaban (ELIQUIS) 5 MG TABS tablet Take 1 tablet (5 mg total) by mouth 2 (two) times daily.  60 tablet  6  . Ascorbic Acid  (VITAMIN C) 1000 MG tablet Take 1,000 mg by mouth daily.       . bisacodyl (DULCOLAX) 5 MG EC tablet Take 5 mg by mouth daily as needed for moderate constipation.      . budesonide-formoterol (SYMBICORT) 160-4.5 MCG/ACT inhaler Inhale 2 puffs into the lungs.       . citalopram (CELEXA) 20 MG tablet Take 20 mg by mouth daily.      Marland Kitchen diltiazem (CARDIZEM CD) 240 MG 24 hr capsule Take 1 capsule (240 mg total) by mouth daily.  30 capsule  6  . fexofenadine (ALLEGRA) 180 MG tablet Take 180 mg by mouth 2 (two) times daily.      . fluticasone (FLONASE) 50 MCG/ACT nasal spray Place 1 spray into both nostrils.       . furosemide (LASIX) 20 MG tablet Take 2 tablets (40 mg total) by mouth daily.  30 tablet  0  . gabapentin (NEURONTIN) 100 MG capsule Take 100 mg by mouth 3 (three) times daily.       Marland Kitchen guaiFENesin (MUCINEX) 600 MG 12 hr tablet Take 1 tablet (600 mg total) by mouth 2 (two) times daily.  20 tablet  0  . HYDROcodone-acetaminophen (NORCO) 10-325 MG per tablet Take 1 tablet by mouth every 12 (twelve) hours as needed for severe pain.  30 tablet  0  . insulin glargine (LANTUS) 100 UNIT/ML injection Inject 0.1 mLs (10 Units total) into the skin at bedtime.  10 mL  11  . levofloxacin (LEVAQUIN) 500 MG tablet Take 1 tablet (500 mg total) by mouth daily.  7 tablet  0  . levothyroxine (SYNTHROID, LEVOTHROID) 112 MCG tablet Take 112 mcg by mouth daily before breakfast.      . metFORMIN (GLUCOPHAGE) 500 MG tablet Take 1 tablet (500 mg total) by mouth 2 (two) times daily with a meal.  60 tablet  3  . pantoprazole (PROTONIX) 40 MG tablet Take 40 mg by mouth daily.       . predniSONE (DELTASONE) 10 MG tablet Prednisone 40 mg po daily x 2 day then Prednisone 30 mg po daily x 2 day then Prednisone 20 mg po daily x 2 day then Prednisone 10 mg daily x 2 day then stop...  30 tablet  0  . ramipril (ALTACE) 5 MG capsule Take 1 capsule (5 mg total) by mouth daily.  30 capsule  1  . simvastatin (ZOCOR) 40 MG tablet  Take 40 mg by mouth daily at 6 PM.       . sodium chloride (OCEAN) 0.65 % SOLN nasal spray Place 1 spray into both nostrils as needed for congestion (also available OTC).  30 mL  2  . theophylline (THEODUR) 200 MG 12 hr tablet Take 400 mg by mouth 2 (two) times daily.      . vitamin E 1000 UNIT capsule Take 1,000 Units by mouth daily.       Scheduled: . ALPRAZolam  0.5-1 mg Oral TID  .  apixaban  5 mg Oral BID  . budesonide-formoterol  2 puff Inhalation BID  . calcium carbonate  1 tablet Oral BID  . cholecalciferol  400 Units Oral Daily  . citalopram  20 mg Oral Daily  . diltiazem  240 mg Oral Daily  . fluticasone  1 spray Each Nare Daily  . furosemide  40 mg Oral Daily  . gabapentin  100 mg Oral TID  . guaiFENesin  600 mg Oral BID  . insulin aspart  0-20 Units Subcutaneous TID WC  . insulin aspart  0-5 Units Subcutaneous QHS  . insulin aspart  6 Units Subcutaneous TID WC  . insulin glargine  25 Units Subcutaneous QHS  . ipratropium-albuterol  3 mL Nebulization Q4H  . levofloxacin  750 mg Oral Daily  . levothyroxine  112 mcg Oral QAC breakfast  . loratadine  10 mg Oral Daily  . metFORMIN  500 mg Oral BID WC  . methylPREDNISolone (SOLU-MEDROL) injection  60 mg Intravenous 3 times per day  . pantoprazole  40 mg Oral Daily  . ramipril  5 mg Oral Daily  . senna-docusate  1 tablet Oral QHS  . simvastatin  40 mg Oral q1800  . sodium chloride  3 mL Intravenous Q12H  . sodium chloride  3 mL Intravenous Q12H  . theophylline  200 mg Oral Q12H   Continuous:  GYJ:EHUDJS chloride, albuterol, alum & mag hydroxide-simeth, bisacodyl, bisacodyl, HYDROcodone-acetaminophen, ondansetron (ZOFRAN) IV, ondansetron, senna-docusate, sodium chloride, sodium chloride  Assesment: She has COPD exacerbation and acute on chronic respiratory failure. She has atrial fibrillation but today her heart seems fairly regular. I did not review the monitor yet. She has a history of chronic diastolic heart failure but  that does not seem to be playing a large part in her current problem. She is very confused about her DME supplier and says she's getting oxygen from hospice but is involved with advanced home care. She does not want to participate in the hospice program at this point. Principal Problem:   Acute on chronic respiratory failure Active Problems:   HYPOTHYROIDISM   Type 2 diabetes mellitus   DEPRESSION   Essential hypertension, benign   BACK PAIN, CHRONIC   Atrial fibrillation   Chronic diastolic heart failure   COPD with exacerbation   COPD (chronic obstructive pulmonary disease)   Thoracic compression fracture   Ventral hernia   Obesity   Anemia    Plan: Continue current treatments    LOS: 4 days   Joanna Perez 03/27/2014, 8:46 AM

## 2014-03-27 NOTE — Progress Notes (Signed)
[facility] PROGRESS NOTE  Joanna Perez:[id number] DOB: 08-06-1956 DOA: 03/23/2014 PCP: Glo Herring., MD  Assessment/Plan: -Oxygen dependent COPD with bronchitic exacerbation.  Very slow to improve. The patient has had frequent hospitalizations for COPD exacerbations. Dr. Luan Pulling is her primary pulmonologist. The etiology of her frequent exacerbations is unclear. CT angiogram of her chest revealed no PE and no infiltrates. She stopped smoking several months ago. Appreciate dr. Luan Pulling assistance. Will continue IV Solu-Medrol at current dose, continue albuterol/Atrovent nebulizers, Symbicort, Levaquin day #5 with  theophylline (at half the dose until she is less tachycardic and then restart her usual home dose.) Of note, she was on 10mg  prednisone chronically and PCP decreased to 5mg . She was evaluated by Dr. Luan Pulling who opines baseline is severe COPD and added flutter valve.  -Acute on chronic respiratory failure with hypoxia.  Her oxygen saturations are 90% on 3L [place]. She is on 4 L of nasal cannula oxygen chronically. Her ABG on 03/23/14 revealed a pH of 7.37, PCO2 of 52, and PO2 of 56.  Will mobilize some today and monitor respiratory effort and oxygen saturation level.  -Chronic diastolic heart failure.  volume status -9.1L. Weight 87.8kg today down from 89.7kg on admission.she does not appear volume overloaded. No significant peripheral edema on admission and her chest x-ray/CT chest was without pulmonary edema. she was given 1 dose of intravenous Lasix in the ED. Continue her home dose of oral Lasix (40 mg daily). Echo on 03/01/14 revealed an EF of 65-70% and borderline diastolic dysfunction.  -Atrial fibrillation.  On exam, she is in sinus rhythm though mildly tachycardic. We'll continue diltiazem and Eliquis.  -Hypertension.   Fair control on diltiazem and Ramipril.  -Hypothyroidism.  Her TSH is slightly low, but her free T4 is within normal limits. We'll continue Synthroid at the  current dose, but she will need further outpatient monitoring.  -Chronic back pain/new compression fracture at T6 andT12.  Her compression fractures are likely the consequence of chronic steroid therapy. Tums and Vitamin D were started.  -Type 2 diabetes mellitus.  Capillary blood glucose exacerbated by steroid therapy.CBG range 203-215.  Will increaseLantus to 30 units at bedtime. Continue sliding scale NovoLog to resistant scale and add additional mealtime coverage of NovoLog. Metformin resumed today. Hemoglobin A1c 7.2.  -Anemia  Her anemia is chronic per chart review. We'll continue to monitor. Further outpatient evaluation will be deferred to her primary care physician.  -GI prophylaxis.  Twice daily Pepcid added in light of chronic steroid therapy. TUMS added as a calcium supplement    Code Status: DNR Family Communication: none present Disposition Plan: home when ready hopefully tomorrow   Consultants:  Dr. Luan Pulling  Procedures:  none  Antibiotics:  levaquin 03/23/14>>  HPI/Subjective: Lying in bed. Reports still "have this infection in my lungs." reports back pain with cough  Objective: Filed Vitals:   03/27/14 0456  BP: 148/76  Pulse: 110  Temp:   Resp: 20    Intake/Output Summary (Last 24 hours) at 03/27/14 1005 Last data filed at 03/27/14 0800  Gross per 24 hour  Intake    360 ml  Output   1101 ml  Net   -741 ml   Filed Weights   03/25/14 0458 03/26/14 0440 03/27/14 0456  Weight: 88.2 kg (194 lb 7.1 oz) 88.5 kg (195 lb 1.7 oz) 87.8 kg (193 lb 9 oz)    Exam:   General:  Appears comfortable. Somewhat teary today  Cardiovascular: tachycardic mildly but regular. No  mgr no LE edema  Respiratory: mild increased work of breathing with conversation. Some improved air flow in bases. BS diminished in upper lobes. Diffuse rhonchi  Abdomen: obese soft +BS non-tender to palpation  Musculoskeletal: no clubbing or cyanosis   Data Reviewed: Basic Metabolic  Panel:  Recent Labs Lab 03/23/14 1027 03/24/14 0602 03/26/14 0809 03/27/14 0533  NA 139 138 138 140  K 4.4 4.3 3.9 3.6*  CL 96 96 91* 91*  CO2 31 32 35* 35*  GLUCOSE 142* 311* 298* 244*  BUN 17 21 28* 34*  CREATININE 0.67 0.55 0.52 0.66  CALCIUM 9.8 8.9 10.2 10.0   Liver Function Tests:  Recent Labs Lab 03/23/14 1046  AST 11  ALT 13  ALKPHOS 59  BILITOT 0.3  PROT 6.6  ALBUMIN 3.4*   No results found for this basename: LIPASE, AMYLASE,  in the last 168 hours No results found for this basename: AMMONIA,  in the last 168 hours CBC:  Recent Labs Lab 03/23/14 1027 03/24/14 0602 03/26/14 0809 03/27/14 0533  WBC 10.2 8.5 12.1* 13.1*  NEUTROABS 8.2*  --   --   --   HGB 10.4* 10.1* 12.1 11.8*  HCT 33.8* 32.5* 38.0 37.6  MCV 79.0 78.9 78.4 79.2  PLT 231 262 301 313   Cardiac Enzymes:  Recent Labs Lab 03/23/14 1046  TROPONINI <0.30   BNP (last 3 results)  Recent Labs  02/28/14 1931 03/13/14 1137 03/23/14 1046  PROBNP 784.0* 439.0* 369.0*   CBG:  Recent Labs Lab 03/26/14 0826 03/26/14 1219 03/26/14 1636 03/26/14 2017 03/27/14 0756  GLUCAP 275* 264* 236* 203* 215*    No results found for this or any previous visit (from the past 240 hour(s)).   Studies: No results found.  Scheduled Meds: . ALPRAZolam  0.5-1 mg Oral TID  . apixaban  5 mg Oral BID  . budesonide-formoterol  2 puff Inhalation BID  . calcium carbonate  1 tablet Oral BID  . cholecalciferol  400 Units Oral Daily  . citalopram  20 mg Oral Daily  . diltiazem  240 mg Oral Daily  . fluticasone  1 spray Each Nare Daily  . furosemide  40 mg Oral Daily  . gabapentin  100 mg Oral TID  . guaiFENesin  600 mg Oral BID  . insulin aspart  0-20 Units Subcutaneous TID WC  . insulin aspart  0-5 Units Subcutaneous QHS  . insulin aspart  6 Units Subcutaneous TID WC  . insulin glargine  25 Units Subcutaneous QHS  . ipratropium-albuterol  3 mL Nebulization Q4H  . levofloxacin  750 mg Oral Daily   . levothyroxine  112 mcg Oral QAC breakfast  . loratadine  10 mg Oral Daily  . metFORMIN  500 mg Oral BID WC  . methylPREDNISolone (SOLU-MEDROL) injection  60 mg Intravenous 3 times per day  . pantoprazole  40 mg Oral Daily  . ramipril  5 mg Oral Daily  . senna-docusate  1 tablet Oral QHS  . simvastatin  40 mg Oral q1800  . sodium chloride  3 mL Intravenous Q12H  . sodium chloride  3 mL Intravenous Q12H  . theophylline  200 mg Oral Q12H   Continuous Infusions:   Principal Problem:   Acute on chronic respiratory failure Active Problems:   HYPOTHYROIDISM   Type 2 diabetes mellitus   DEPRESSION   Essential hypertension, benign   BACK PAIN, CHRONIC   Atrial fibrillation   Chronic diastolic heart failure   COPD with exacerbation  COPD (chronic obstructive pulmonary disease)   Thoracic compression fracture   Ventral hernia   Obesity   Anemia    Time spent: 35 minutes    [facility] Pager [phone]. If 7PM-7AM, please contact night-coverage at [email or web address], password [facility] 03/27/2014, 10:05 AM  LOS: 4 days

## 2014-03-28 DIAGNOSIS — M549 Dorsalgia, unspecified: Secondary | ICD-10-CM

## 2014-03-28 DIAGNOSIS — I5032 Chronic diastolic (congestive) heart failure: Secondary | ICD-10-CM

## 2014-03-28 DIAGNOSIS — J449 Chronic obstructive pulmonary disease, unspecified: Secondary | ICD-10-CM

## 2014-03-28 DIAGNOSIS — I10 Essential (primary) hypertension: Secondary | ICD-10-CM

## 2014-03-28 LAB — BASIC METABOLIC PANEL
BUN: 32 mg/dL — AB (ref 6–23)
CALCIUM: 9.6 mg/dL (ref 8.4–10.5)
CO2: 33 mEq/L — ABNORMAL HIGH (ref 19–32)
CREATININE: 0.59 mg/dL (ref 0.50–1.10)
Chloride: 92 mEq/L — ABNORMAL LOW (ref 96–112)
GLUCOSE: 300 mg/dL — AB (ref 70–99)
Potassium: 4.4 mEq/L (ref 3.7–5.3)
Sodium: 139 mEq/L (ref 137–147)

## 2014-03-28 LAB — CBC
HEMATOCRIT: 39.3 % (ref 36.0–46.0)
Hemoglobin: 12.5 g/dL (ref 12.0–15.0)
MCH: 25.2 pg — AB (ref 26.0–34.0)
MCHC: 31.8 g/dL (ref 30.0–36.0)
MCV: 79.2 fL (ref 78.0–100.0)
Platelets: 305 10*3/uL (ref 150–400)
RBC: 4.96 MIL/uL (ref 3.87–5.11)
RDW: 20.1 % — ABNORMAL HIGH (ref 11.5–15.5)
WBC: 11 10*3/uL — ABNORMAL HIGH (ref 4.0–10.5)

## 2014-03-28 LAB — GLUCOSE, CAPILLARY
Glucose-Capillary: 271 mg/dL — ABNORMAL HIGH (ref 70–99)
Glucose-Capillary: 180 mg/dL — ABNORMAL HIGH (ref 70–99)

## 2014-03-28 MED ORDER — PREDNISONE 10 MG PO TABS: ORAL_TABLET | ORAL | Status: DC

## 2014-03-28 MED ORDER — NYSTATIN 100000 UNIT/ML MT SUSP: 5.0000 mL | Freq: Four times a day (QID) | OROMUCOSAL | Status: DC

## 2014-03-28 MED ORDER — BUDESONIDE 0.25 MG/2ML IN SUSP: 0.2500 mg | Freq: Two times a day (BID) | RESPIRATORY_TRACT | Status: DC

## 2014-03-28 MED ORDER — NYSTATIN 100000 UNIT/ML MT SUSP
5.0000 mL | Freq: Four times a day (QID) | OROMUCOSAL | Status: DC
  Administered 2014-03-28: 500000 [IU] via ORAL
  Filled 2014-03-28: qty 5

## 2014-03-28 MED ORDER — VITAMIN D 400 UNITS PO TABS: 400.0000 [IU] | ORAL_TABLET | Freq: Every day | ORAL | Status: AC

## 2014-03-28 NOTE — Discharge Summary (Signed)
Physician Discharge Summary  ROBERTINE KIPPER WNI:[id number] DOB: February 28, 1956 DOA: 03/23/2014  PCP: Glo Herring., MD  Admit date: 03/23/2014 Discharge date: 03/28/2014  Time spent: 40 minutes  Recommendations for Outpatient Follow-up:  1. Dr Gerarda Fraction PCP in 1 week for evaluation of symptoms particularly respiratory status as well as follow TSH and Free T4. Follow CBG's in setting of steroid taper and chronic use 2. Recommend discussion regarding goals of care given severity of lung disease.    Discharge Diagnoses:  Principal Problem:   Acute on chronic respiratory failure Active Problems:   HYPOTHYROIDISM   Type 2 diabetes mellitus   DEPRESSION   Essential hypertension, benign   BACK PAIN, CHRONIC   Atrial fibrillation   Chronic diastolic heart failure   COPD with exacerbation   COPD (chronic obstructive pulmonary disease)   Thoracic compression fracture   Ventral hernia   Obesity   Anemia   Discharge Condition: stable at baseline  Diet recommendation: carb modified  Filed Weights   03/26/14 0440 03/27/14 0456 03/28/14 0513  Weight: 88.5 kg (195 lb 1.7 oz) 87.8 kg (193 lb 9 oz) 87 kg (191 lb 12.8 oz)    History of present illness:  Joanna Perez is a 58 y.o. female with a history of diastolic heart failure, COPD she is on 4 L of home oxygen, hypertension, hypothyroidism, atrial fibrillation, CAD with a history of MI, diabetes presented to the emergency department with chief complaint of worsening shortness of breath and cough. She reported that she was discharged from the hospital 10 days prior after a 2 day stay for acute on chronic respiratory failure related to COPD and CHF. She stated she went back to her PCP the day prior for followup and he recommended she come to the emergency department. She stated "he said you should not have let me go so early". She reported that she felt like her breathing was at baseline upon discharge. However since she'd been at home her  breathing had gotten gradually progressively worse with a moist productive cough. She denied fever chills vomiting diarrhea. She denied chest pain palpitations diaphoresis nausea vomiting. She did endorse some chronic back pain particularly when she coughed. Reported the pain in mid back and sharp particularly with coughing. In addition she wass experiencing some frequency and urgency with her bladder. He denied dysuria hematuria. She denied lower extremity edema but indicated she sleeps with 3 pillows. Stated she'd been in the hospital several times this month and before symptoms were related to "fluid buildup".   Hospital Course:  Oxygen dependent COPD with bronchitic exacerbation.  -Admitted to medical floor and provided with steroids, nebs, levaquin. Very slow to improve. Dr. Luan Pulling  her primary pulmonologist evaluated and added flutter valve, plus recommended low tapering and use of pulmicort.  -CT angiogram of her chest revealed no PE and no infiltrates. She stopped smoking several months ago. At discharge oxygen saturation level 90-94% on 4L which is baseline. She completed 5 days of Levaquin inpatient.  -She will be discharged with steroid taper to 10mg  daily indefinitely. Also pulmicort nebs added. Continue spiriva and PRN albuterol -Recommend follow up with PCP in 1 week for evaluation of respiratory. Recommend discussion regarding goals of care.    -Acute on chronic respiratory failure with hypoxia.  -Resolved at discharge. Her oxygen saturations are 90-94% on 4L [place]. She is on 4 L of nasal cannula oxygen chronically.  -Her ABG on 03/23/14 revealed a pH of 7.37, PCO2 of 52, and PO2  of 56.  -discharged on pulmicort, spiriva and PRN albuterol -close follow up with PCP and Dr. Luan Pulling as an outpatient -will need chronic prednisone   Chronic diastolic heart failure.  -volume status -10.4L. Weight 87.8kg today down from 89.7kg on admission. -she does not appear volume overloaded. No significant  peripheral edema on admission and her chest x-ray/CT chest also demonstrated no pulmonary edema. -Continue her home dose of oral Lasix (40 mg daily). Echo on 03/01/14 revealed an EF of 65-70% and borderline diastolic dysfunction.  -daily weight and los sodium diet recommended  -Atrial fibrillation.  She remained in sinus rhythm though mildly tachycardic. Will continue diltiazem and Eliquis.   -Hypertension.  Fair control on diltiazem and Ramipril.   -Hypothyroidism.  Her TSH is slightly low, but her free T4 is within normal limits. Continue Synthroid at the current dose, but she will need further outpatient monitoring and dose adjustments.   -Chronic back pain/new compression fracture at T6 andT12.  -Her compression fractures are likely the consequence of chronic steroid therapy. Tums and Vitamin D were started.  -continue PRN analgesics  -Type 2 diabetes mellitus.  Capillary blood glucose exacerbated by steroid therapy.CBG range 180-270. Hemoglobin A1c 7.2. Will be better controlled with steroid tapered; but might required oral hypoglycemic medications given A1C 7.2 and the fact she is going to be on chronic prednisone. -PCP to evaluate and decide further treatment. Patient wants PCP opinion prior to start any oral regimen. Meanwhile advise to follow lo carbs diet.    -Anemia   Mild and chronic per chart review. Further outpatient evaluation will be deferred to her primary care physician.   -GI prophylaxis.  Twice daily Pepcid added in light of chronic steroid therapy. TUMS added as a calcium supplement    Procedures:  See below for x-ray reports   Consultations:  Dr Luan Pulling  Discharge Exam: Filed Vitals:   03/28/14 1024  BP: 146/81  Pulse:   Temp:   Resp:     General: obese appears comfortable Cardiovascular: RRR no MGR No LE edema Respiratory: mild increased work of breathing with exertion, but according to patient mainly close to baseline. Improved air flow and able  to speak in full sentences. No wheezes or crackles.   Discharge Instructions You were cared for by a hospitalist during your hospital stay. If you have any questions about your discharge medications or the care you received while you were in the hospital after you are discharged, you can call the unit and asked to speak with the hospitalist on call if the hospitalist that took care of you is not available. Once you are discharged, your primary care physician will handle any further medical issues. Please note that NO REFILLS for any discharge medications will be authorized once you are discharged, as it is imperative that you return to your primary care physician (or establish a relationship with a primary care physician if you do not have one) for your aftercare needs so that they can reassess your need for medications and monitor your lab values.      Discharge Orders   Future Appointments Provider Department Dept Phone   05/04/2014 1:20 PM Satira Sark, MD [place] [phone]   Future Orders Complete By Expires   Diet - low sodium heart healthy  As directed    Discharge instructions  As directed    Increase activity slowly  As directed        Medication List    STOP taking these  medications       budesonide-formoterol 160-4.5 MCG/ACT inhaler  Commonly known as:  SYMBICORT     levofloxacin 500 MG tablet  Commonly known as:  LEVAQUIN      TAKE these medications       albuterol 108 (90 BASE) MCG/ACT inhaler  Commonly known as:  PROVENTIL HFA;VENTOLIN HFA  Inhale 1 puff into the lungs every 4 (four) hours as needed for wheezing or shortness of breath.     albuterol (2.5 MG/3ML) 0.083% nebulizer solution  Commonly known as:  PROVENTIL  Take 3 mLs (2.5 mg total) by nebulization every 6 (six) hours as needed for wheezing or shortness of breath.     ALPRAZolam 1 MG tablet  Commonly known as:  XANAX  Take 0.5-1 mg by mouth 3 (three) times daily.     apixaban  5 MG Tabs tablet  Commonly known as:  ELIQUIS  Take 1 tablet (5 mg total) by mouth 2 (two) times daily.     bisacodyl 5 MG EC tablet  Commonly known as:  DULCOLAX  Take 5 mg by mouth daily as needed for moderate constipation.     budesonide 0.25 MG/2ML nebulizer solution  Commonly known as:  PULMICORT  Take 2 mLs (0.25 mg total) by nebulization 2 (two) times daily.     citalopram 20 MG tablet  Commonly known as:  CELEXA  Take 20 mg by mouth daily.     diltiazem 240 MG 24 hr capsule  Commonly known as:  CARDIZEM CD  Take 1 capsule (240 mg total) by mouth daily.     fexofenadine 180 MG tablet  Commonly known as:  ALLEGRA  Take 180 mg by mouth 2 (two) times daily.     fluticasone 50 MCG/ACT nasal spray  Commonly known as:  FLONASE  Place 1 spray into both nostrils.     furosemide 20 MG tablet  Commonly known as:  LASIX  Take 2 tablets (40 mg total) by mouth daily.     guaiFENesin 600 MG 12 hr tablet  Commonly known as:  MUCINEX  Take 1 tablet (600 mg total) by mouth 2 (two) times daily.     HYDROcodone-acetaminophen 10-325 MG per tablet  Commonly known as:  NORCO  Take 1 tablet by mouth every 12 (twelve) hours as needed for severe pain.     insulin glargine 100 UNIT/ML injection  Commonly known as:  LANTUS  Inject 0.1 mLs (10 Units total) into the skin at bedtime.     levothyroxine 112 MCG tablet  Commonly known as:  SYNTHROID, LEVOTHROID  Take 112 mcg by mouth daily before breakfast.     metFORMIN 500 MG tablet  Commonly known as:  GLUCOPHAGE  Take 1 tablet (500 mg total) by mouth 2 (two) times daily with a meal.     NEURONTIN 100 MG capsule  Generic drug:  gabapentin  Take 100 mg by mouth 3 (three) times daily.     nystatin 100000 UNIT/ML suspension  Commonly known as:  MYCOSTATIN  Take 5 mLs (500,000 Units total) by mouth 4 (four) times daily.     pantoprazole 40 MG tablet  Commonly known as:  PROTONIX  Take 40 mg by mouth daily.     predniSONE 10 MG  tablet  Commonly known as:  DELTASONE  Take 6 tabs daily for 2 days starting 03/29/14 then take 5 tabs daily for 2 days then take 4 tabs daily for 2 das then take 1 tab daily indefinitely.  ramipril 5 MG capsule  Commonly known as:  ALTACE  Take 1 capsule (5 mg total) by mouth daily.     simvastatin 40 MG tablet  Commonly known as:  ZOCOR  Take 40 mg by mouth daily at 6 PM.     sodium chloride 0.65 % Soln nasal spray  Commonly known as:  OCEAN  Place 1 spray into both nostrils as needed for congestion (also available OTC).     theophylline 200 MG 12 hr tablet  Commonly known as:  THEODUR  Take 400 mg by mouth 2 (two) times daily.     vitamin C 1000 MG tablet  Take 1,000 mg by mouth daily.     vitamin D (CHOLECALCIFEROL) 400 UNITS tablet  Take 1 tablet (400 Units total) by mouth daily.     vitamin E 1000 UNIT capsule  Take 1,000 Units by mouth daily.       Allergies  Allergen Reactions  . Amoxicillin Hives  . Bactrim [Sulfamethoxazole-Tmp Ds] Hives  . Erythromycin Hives  . Keflex [Cephalexin] Hives  . Penicillins Itching and Swelling    Sweating   Follow-up Information   Follow up with Glo Herring., MD. Schedule an appointment as soon as possible for a visit in 1 week. (for evaluation of respiratory status)    Specialty:  Internal Medicine   Contact information:   [address] [place] [address] [phone]        The results of significant diagnostics from this hospitalization (including imaging, microbiology, ancillary and laboratory) are listed below for reference.    Significant Diagnostic Studies: Ct Angio Chest Pe W/cm &/or Wo Cm  03/23/2014   CLINICAL DATA:  Shortness of breath, hypoxia.  EXAM: CT ANGIOGRAPHY CHEST WITH CONTRAST  TECHNIQUE: Multidetector CT imaging of the chest was performed using the standard protocol during bolus administration of intravenous contrast. Multiplanar CT image reconstructions and MIPs were  obtained to evaluate the vascular anatomy.  CONTRAST:  161mL OMNIPAQUE IOHEXOL 350 MG/ML SOLN  COMPARISON:  08/27/2013  FINDINGS: Satisfactory opacification of pulmonary arteries noted, and there is no evidence of pulmonary emboli. Adequate contrast opacification of the thoracic aorta with no evidence of dissection, aneurysm, or stenosis. There is bovine variant brachiocephalic arch anatomy without proximal stenosis. Patchy atheromatous plaque in the aortic arch and coronary arteries. No pleural or pericardial effusion. Interval improvement in previous pretracheal and right paratracheal adenopathy. No hilar adenopathy. Calcified posterior right pleural plaque as before. Lungs are clear. Visualized portions of upper abdomen unremarkable. New compression fracture deformity of T12 with approximately 50% loss of height anteriorly, no significant bony retropulsion or posterior element involvement. There is also mild superior endplate compression fracture deformity of T6 which has progressed since previous exam.  Review of the MIP images confirms the above findings.  IMPRESSION: 1. Negative for acute PE or thoracic aortic dissection. 2. New T6 and  T12 vertebral compression fracture deformities. 3. Atherosclerosis, including aortic and coronary artery disease. Please note that although the presence of coronary artery calcium documents the presence of coronary artery disease, the severity of this disease and any potential stenosis cannot be assessed on this non-gated CT examination. Assessment for potential risk factor modification, dietary therapy or pharmacologic therapy may be warranted, if clinically indicated.   Electronically Signed   By: Arne Cleveland M.D.   On: 03/23/2014 15:31   Dg Chest Portable 1 View  03/23/2014   CLINICAL DATA:  Shortness of breath, history asthma, COPD, essential hypertension,  diabetes, coronary artery disease post MI, atrial fibrillation, CHF  EXAM: PORTABLE CHEST - 1 VIEW  COMPARISON:   Portable exam 1036 hr compared to 03/13/2014  FINDINGS: Upper normal heart size.  Normal mediastinal contours and pulmonary vascularity.  Chronic bronchitic changes.  Slight chronic accentuation of interstitial markings similar to previous exam.  No definite acute infiltrate, pleural effusion or pneumothorax.  Bones demineralized.  Exam limited by body habitus.  IMPRESSION: Chronic bronchitic changes without acute infiltrate.   Electronically Signed   By: Lavonia Dana M.D.   On: 03/23/2014 10:57   Dg Chest Portable 1 View  03/13/2014   CLINICAL DATA:  Short of breath  EXAM: PORTABLE CHEST - 1 VIEW  COMPARISON:  02/28/2014  FINDINGS: The heart size and mediastinal contours are within normal limits. Both lungs are clear. The visualized skeletal structures are unremarkable.  IMPRESSION: No active disease.   Electronically Signed   By: Franchot Gallo M.D.   On: 03/13/2014 12:02   Dg Chest Portable 1 View  02/28/2014   CLINICAL DATA:  Cough and shortness of Breath.  EXAM: PORTABLE CHEST - 1 VIEW  COMPARISON:  01/26/2014  FINDINGS: The heart is enlarged. The mediastinal and hilar contours are stable. There is vascular congestion and pulmonary edema consistent with CHF. No definite pleural effusions.  IMPRESSION: CHF.   Electronically Signed   By: Kalman Jewels M.D.   On: 02/28/2014 19:23    Labs: Basic Metabolic Panel:  Recent Labs Lab 03/23/14 1027 03/24/14 0602 03/26/14 0809 03/27/14 0533 03/28/14 0528  NA 139 138 138 140 139  K 4.4 4.3 3.9 3.6* 4.4  CL 96 96 91* 91* 92*  CO2 31 32 35* 35* 33*  GLUCOSE 142* 311* 298* 244* 300*  BUN 17 21 28* 34* 32*  CREATININE 0.67 0.55 0.52 0.66 0.59  CALCIUM 9.8 8.9 10.2 10.0 9.6   Liver Function Tests:  Recent Labs Lab 03/23/14 1046  AST 11  ALT 13  ALKPHOS 59  BILITOT 0.3  PROT 6.6  ALBUMIN 3.4*   CBC:  Recent Labs Lab 03/23/14 1027 03/24/14 0602 03/26/14 0809 03/27/14 0533 03/28/14 0528  WBC 10.2 8.5 12.1* 13.1* 11.0*  NEUTROABS  8.2*  --   --   --   --   HGB 10.4* 10.1* 12.1 11.8* 12.5  HCT 33.8* 32.5* 38.0 37.6 39.3  MCV 79.0 78.9 78.4 79.2 79.2  PLT 231 262 301 313 305   Cardiac Enzymes:  Recent Labs Lab 03/23/14 1046  TROPONINI <0.30   BNP: BNP (last 3 results)  Recent Labs  02/28/14 1931 03/13/14 1137 03/23/14 1046  PROBNP 784.0* 439.0* 369.0*   CBG:  Recent Labs Lab 03/27/14 1203 03/27/14 1625 03/27/14 2045 03/28/14 0723 03/28/14 1159  GLUCAP 198* 195* 184* 271* 180*       Signed: Barton Dubois  [facility] 03/28/2014, 2:03 PM

## 2014-03-28 NOTE — Progress Notes (Addendum)
Inpatient Diabetes Program Recommendations  AACE/ADA: New Consensus Statement on Inpatient Glycemic Control (2013)  Target Ranges:  Prepandial:   less than 140 mg/dL      Peak postprandial:   less than 180 mg/dL (1-2 hours)      Critically ill patients:  140 - 180 mg/dL  Results for JENIPHER, HAVEL (MRN [id number]) as of 03/28/2014 13:54  Ref. Range 03/27/2014 07:56 03/27/2014 12:03 03/27/2014 16:25 03/27/2014 20:45 03/28/2014 07:23 03/28/2014 11:59  Glucose-Capillary Latest Range: 70-99 mg/dL 215 (H) 198 (H) 195 (H) 184 (H) 271 (H) 180 (H)   Diabetes history: DM2  Outpatient Diabetes medications: Metformin 500 mg BID, Lantus 10 units QHS  Current orders for Inpatient glycemic control: Lantus 30 units QHS, Novolog 0-20 units AC, Novolog 0-5 units HS, Novolog 7 units TID with meals for meal coverage  Inpatient Diabetes Program Recommendations Insulin - Meal Coverage: If steroids are continued and not decreased, may want to consider increasing Novolog meal coverage to 10 units TID with meals.  Thanks, Barnie Alderman, RN, MSN, CCRN Diabetes Coordinator Inpatient Diabetes Program [phone] (Team Pager) [phone] ([facility] office) [phone] [facility] office)

## 2014-03-28 NOTE — Progress Notes (Signed)
Patient being d/c home with prescriptions. IV cath removed and  Intact. No pain/swelling at site. Patient awaiting for family to arrive. A little SOB upon ambulation. Patient on continue oxygen.

## 2014-03-28 NOTE — Progress Notes (Signed)
She seems about the same today. She still has cough and congestion but I think she's at baseline. When I asked her how she is compared to her baseline situation she says she feels like she is about back to her usual state.  Her exam shows that she still has some rhonchi but I think that is a chronic situation. Her heart is regular to my exam.  I think from a purely pulmonary point of view she's okay to go home. She has baseline severe pulmonary disease and I think she is high risk for readmission in the future

## 2014-03-29 NOTE — Progress Notes (Signed)
UR chart review completed.  

## 2014-04-07 ENCOUNTER — Inpatient Hospital Stay (HOSPITAL_COMMUNITY)
Admission: EM | Admit: 2014-04-07 | Discharge: 2014-05-18 | DRG: 003 | Disposition: A | Discharge status: Skilled Nursing Facility | Payer: Medicaid Other | Attending: Internal Medicine | Admitting: Internal Medicine

## 2014-04-07 ENCOUNTER — Emergency Department (HOSPITAL_COMMUNITY): Payer: Medicaid Other

## 2014-04-07 ENCOUNTER — Inpatient Hospital Stay (HOSPITAL_COMMUNITY): Payer: Medicaid Other

## 2014-04-07 ENCOUNTER — Encounter (HOSPITAL_COMMUNITY): Payer: Self-pay | Admitting: Emergency Medicine

## 2014-04-07 DIAGNOSIS — E669 Obesity, unspecified: Secondary | ICD-10-CM | POA: Diagnosis present

## 2014-04-07 DIAGNOSIS — D649 Anemia, unspecified: Secondary | ICD-10-CM | POA: Diagnosis present

## 2014-04-07 DIAGNOSIS — I2510 Atherosclerotic heart disease of native coronary artery without angina pectoris: Secondary | ICD-10-CM | POA: Diagnosis present

## 2014-04-07 DIAGNOSIS — J9610 Chronic respiratory failure, unspecified whether with hypoxia or hypercapnia: Secondary | ICD-10-CM | POA: Diagnosis present

## 2014-04-07 DIAGNOSIS — J438 Other emphysema: Secondary | ICD-10-CM

## 2014-04-07 DIAGNOSIS — R197 Diarrhea, unspecified: Secondary | ICD-10-CM | POA: Diagnosis not present

## 2014-04-07 DIAGNOSIS — E2749 Other adrenocortical insufficiency: Secondary | ICD-10-CM | POA: Diagnosis not present

## 2014-04-07 DIAGNOSIS — G9340 Encephalopathy, unspecified: Secondary | ICD-10-CM | POA: Diagnosis present

## 2014-04-07 DIAGNOSIS — Z9981 Dependence on supplemental oxygen: Secondary | ICD-10-CM

## 2014-04-07 DIAGNOSIS — W19XXXA Unspecified fall, initial encounter: Secondary | ICD-10-CM | POA: Diagnosis present

## 2014-04-07 DIAGNOSIS — Z833 Family history of diabetes mellitus: Secondary | ICD-10-CM | POA: Diagnosis not present

## 2014-04-07 DIAGNOSIS — L89109 Pressure ulcer of unspecified part of back, unspecified stage: Secondary | ICD-10-CM | POA: Diagnosis present

## 2014-04-07 DIAGNOSIS — L89309 Pressure ulcer of unspecified buttock, unspecified stage: Secondary | ICD-10-CM | POA: Diagnosis not present

## 2014-04-07 DIAGNOSIS — S22000A Wedge compression fracture of unspecified thoracic vertebra, initial encounter for closed fracture: Secondary | ICD-10-CM

## 2014-04-07 DIAGNOSIS — J9620 Acute and chronic respiratory failure, unspecified whether with hypoxia or hypercapnia: Secondary | ICD-10-CM | POA: Diagnosis not present

## 2014-04-07 DIAGNOSIS — J95851 Ventilator associated pneumonia: Secondary | ICD-10-CM | POA: Diagnosis not present

## 2014-04-07 DIAGNOSIS — Z7901 Long term (current) use of anticoagulants: Secondary | ICD-10-CM

## 2014-04-07 DIAGNOSIS — Z87891 Personal history of nicotine dependence: Secondary | ICD-10-CM | POA: Diagnosis not present

## 2014-04-07 DIAGNOSIS — A498 Other bacterial infections of unspecified site: Secondary | ICD-10-CM

## 2014-04-07 DIAGNOSIS — L259 Unspecified contact dermatitis, unspecified cause: Secondary | ICD-10-CM | POA: Diagnosis not present

## 2014-04-07 DIAGNOSIS — E861 Hypovolemia: Secondary | ICD-10-CM | POA: Diagnosis not present

## 2014-04-07 DIAGNOSIS — I4891 Unspecified atrial fibrillation: Secondary | ICD-10-CM | POA: Diagnosis present

## 2014-04-07 DIAGNOSIS — T380X5A Adverse effect of glucocorticoids and synthetic analogues, initial encounter: Secondary | ICD-10-CM | POA: Diagnosis not present

## 2014-04-07 DIAGNOSIS — Z880 Allergy status to penicillin: Secondary | ICD-10-CM

## 2014-04-07 DIAGNOSIS — I5032 Chronic diastolic (congestive) heart failure: Secondary | ICD-10-CM

## 2014-04-07 DIAGNOSIS — E876 Hypokalemia: Secondary | ICD-10-CM | POA: Diagnosis not present

## 2014-04-07 DIAGNOSIS — Z1624 Resistance to multiple antibiotics: Secondary | ICD-10-CM

## 2014-04-07 DIAGNOSIS — R579 Shock, unspecified: Secondary | ICD-10-CM | POA: Diagnosis not present

## 2014-04-07 DIAGNOSIS — T40605A Adverse effect of unspecified narcotics, initial encounter: Secondary | ICD-10-CM | POA: Diagnosis not present

## 2014-04-07 DIAGNOSIS — S22009A Unspecified fracture of unspecified thoracic vertebra, initial encounter for closed fracture: Secondary | ICD-10-CM | POA: Diagnosis present

## 2014-04-07 DIAGNOSIS — E662 Morbid (severe) obesity with alveolar hypoventilation: Secondary | ICD-10-CM | POA: Diagnosis present

## 2014-04-07 DIAGNOSIS — J449 Chronic obstructive pulmonary disease, unspecified: Secondary | ICD-10-CM

## 2014-04-07 DIAGNOSIS — Y9210 Unspecified residential institution as the place of occurrence of the external cause: Secondary | ICD-10-CM | POA: Diagnosis not present

## 2014-04-07 DIAGNOSIS — E43 Unspecified severe protein-calorie malnutrition: Secondary | ICD-10-CM | POA: Diagnosis not present

## 2014-04-07 DIAGNOSIS — Z2989 Encounter for other specified prophylactic measures: Secondary | ICD-10-CM

## 2014-04-07 DIAGNOSIS — G959 Disease of spinal cord, unspecified: Secondary | ICD-10-CM | POA: Diagnosis present

## 2014-04-07 DIAGNOSIS — G8929 Other chronic pain: Secondary | ICD-10-CM | POA: Diagnosis present

## 2014-04-07 DIAGNOSIS — F411 Generalized anxiety disorder: Secondary | ICD-10-CM | POA: Diagnosis not present

## 2014-04-07 DIAGNOSIS — J9621 Acute and chronic respiratory failure with hypoxia: Secondary | ICD-10-CM

## 2014-04-07 DIAGNOSIS — Z930 Tracheostomy status: Secondary | ICD-10-CM

## 2014-04-07 DIAGNOSIS — J9600 Acute respiratory failure, unspecified whether with hypoxia or hypercapnia: Secondary | ICD-10-CM

## 2014-04-07 DIAGNOSIS — G9519 Other vascular myelopathies: Secondary | ICD-10-CM | POA: Diagnosis present

## 2014-04-07 DIAGNOSIS — E039 Hypothyroidism, unspecified: Secondary | ICD-10-CM | POA: Diagnosis present

## 2014-04-07 DIAGNOSIS — Z228 Carrier of other infectious diseases: Secondary | ICD-10-CM | POA: Diagnosis not present

## 2014-04-07 DIAGNOSIS — I10 Essential (primary) hypertension: Secondary | ICD-10-CM | POA: Diagnosis present

## 2014-04-07 DIAGNOSIS — E872 Acidosis, unspecified: Secondary | ICD-10-CM | POA: Diagnosis not present

## 2014-04-07 DIAGNOSIS — Y849 Medical procedure, unspecified as the cause of abnormal reaction of the patient, or of later complication, without mention of misadventure at the time of the procedure: Secondary | ICD-10-CM | POA: Diagnosis not present

## 2014-04-07 DIAGNOSIS — E860 Dehydration: Secondary | ICD-10-CM

## 2014-04-07 DIAGNOSIS — J45901 Unspecified asthma with (acute) exacerbation: Secondary | ICD-10-CM | POA: Diagnosis not present

## 2014-04-07 DIAGNOSIS — E1169 Type 2 diabetes mellitus with other specified complication: Secondary | ICD-10-CM | POA: Diagnosis present

## 2014-04-07 DIAGNOSIS — E1165 Type 2 diabetes mellitus with hyperglycemia: Secondary | ICD-10-CM

## 2014-04-07 DIAGNOSIS — M6281 Muscle weakness (generalized): Secondary | ICD-10-CM | POA: Diagnosis present

## 2014-04-07 DIAGNOSIS — E870 Hyperosmolality and hypernatremia: Secondary | ICD-10-CM | POA: Diagnosis present

## 2014-04-07 DIAGNOSIS — L8993 Pressure ulcer of unspecified site, stage 3: Secondary | ICD-10-CM | POA: Diagnosis not present

## 2014-04-07 DIAGNOSIS — I252 Old myocardial infarction: Secondary | ICD-10-CM | POA: Diagnosis not present

## 2014-04-07 DIAGNOSIS — J441 Chronic obstructive pulmonary disease with (acute) exacerbation: Secondary | ICD-10-CM

## 2014-04-07 DIAGNOSIS — N179 Acute kidney failure, unspecified: Secondary | ICD-10-CM

## 2014-04-07 DIAGNOSIS — L8992 Pressure ulcer of unspecified site, stage 2: Secondary | ICD-10-CM | POA: Diagnosis present

## 2014-04-07 DIAGNOSIS — Z418 Encounter for other procedures for purposes other than remedying health state: Secondary | ICD-10-CM

## 2014-04-07 DIAGNOSIS — A4901 Methicillin susceptible Staphylococcus aureus infection, unspecified site: Secondary | ICD-10-CM | POA: Diagnosis present

## 2014-04-07 DIAGNOSIS — N170 Acute kidney failure with tubular necrosis: Secondary | ICD-10-CM

## 2014-04-07 DIAGNOSIS — M549 Dorsalgia, unspecified: Secondary | ICD-10-CM

## 2014-04-07 DIAGNOSIS — D473 Essential (hemorrhagic) thrombocythemia: Secondary | ICD-10-CM | POA: Diagnosis present

## 2014-04-07 DIAGNOSIS — E119 Type 2 diabetes mellitus without complications: Secondary | ICD-10-CM | POA: Diagnosis present

## 2014-04-07 DIAGNOSIS — Z6841 Body Mass Index (BMI) 40.0 and over, adult: Secondary | ICD-10-CM

## 2014-04-07 DIAGNOSIS — I509 Heart failure, unspecified: Secondary | ICD-10-CM | POA: Diagnosis present

## 2014-04-07 DIAGNOSIS — Z79899 Other long term (current) drug therapy: Secondary | ICD-10-CM | POA: Diagnosis not present

## 2014-04-07 DIAGNOSIS — G8220 Paraplegia, unspecified: Secondary | ICD-10-CM | POA: Diagnosis present

## 2014-04-07 DIAGNOSIS — B9689 Other specified bacterial agents as the cause of diseases classified elsewhere: Secondary | ICD-10-CM | POA: Diagnosis present

## 2014-04-07 DIAGNOSIS — IMO0002 Reserved for concepts with insufficient information to code with codable children: Secondary | ICD-10-CM | POA: Diagnosis present

## 2014-04-07 DIAGNOSIS — M4804 Spinal stenosis, thoracic region: Secondary | ICD-10-CM

## 2014-04-07 DIAGNOSIS — G4733 Obstructive sleep apnea (adult) (pediatric): Secondary | ICD-10-CM | POA: Diagnosis present

## 2014-04-07 DIAGNOSIS — K219 Gastro-esophageal reflux disease without esophagitis: Secondary | ICD-10-CM | POA: Diagnosis present

## 2014-04-07 DIAGNOSIS — J439 Emphysema, unspecified: Secondary | ICD-10-CM

## 2014-04-07 DIAGNOSIS — B957 Other staphylococcus as the cause of diseases classified elsewhere: Secondary | ICD-10-CM

## 2014-04-07 DIAGNOSIS — Z1635 Resistance to multiple antimicrobial drugs: Secondary | ICD-10-CM

## 2014-04-07 DIAGNOSIS — J4489 Other specified chronic obstructive pulmonary disease: Secondary | ICD-10-CM

## 2014-04-07 DIAGNOSIS — I959 Hypotension, unspecified: Secondary | ICD-10-CM

## 2014-04-07 DIAGNOSIS — E785 Hyperlipidemia, unspecified: Secondary | ICD-10-CM | POA: Diagnosis present

## 2014-04-07 DIAGNOSIS — R7881 Bacteremia: Secondary | ICD-10-CM

## 2014-04-07 LAB — CBC WITH DIFFERENTIAL/PLATELET
Basophils Absolute: 0 10*3/uL (ref 0.0–0.1)
Basophils Relative: 0 % (ref 0–1)
EOS ABS: 0 10*3/uL (ref 0.0–0.7)
Eosinophils Relative: 0 % (ref 0–5)
HEMATOCRIT: 29.6 % — AB (ref 36.0–46.0)
HEMOGLOBIN: 9 g/dL — AB (ref 12.0–15.0)
Lymphocytes Relative: 3 % — ABNORMAL LOW (ref 12–46)
Lymphs Abs: 0.4 10*3/uL — ABNORMAL LOW (ref 0.7–4.0)
MCH: 25.2 pg — AB (ref 26.0–34.0)
MCHC: 30.4 g/dL (ref 30.0–36.0)
MCV: 82.9 fL (ref 78.0–100.0)
MONO ABS: 0.6 10*3/uL (ref 0.1–1.0)
MONOS PCT: 4 % (ref 3–12)
NEUTROS PCT: 93 % — AB (ref 43–77)
Neutro Abs: 12 10*3/uL — ABNORMAL HIGH (ref 1.7–7.7)
Platelets: 285 10*3/uL (ref 150–400)
RBC: 3.57 MIL/uL — AB (ref 3.87–5.11)
RDW: 19.9 % — ABNORMAL HIGH (ref 11.5–15.5)
WBC: 13 10*3/uL — ABNORMAL HIGH (ref 4.0–10.5)

## 2014-04-07 LAB — URINALYSIS, ROUTINE W REFLEX MICROSCOPIC
Bilirubin Urine: NEGATIVE
Glucose, UA: NEGATIVE mg/dL
Hgb urine dipstick: NEGATIVE
KETONES UR: NEGATIVE mg/dL
LEUKOCYTES UA: NEGATIVE
Nitrite: NEGATIVE
PROTEIN: NEGATIVE mg/dL
Specific Gravity, Urine: 1.025 (ref 1.005–1.030)
UROBILINOGEN UA: 0.2 mg/dL (ref 0.0–1.0)
pH: 5.5 (ref 5.0–8.0)

## 2014-04-07 LAB — COMPREHENSIVE METABOLIC PANEL
ALK PHOS: 52 U/L (ref 39–117)
ALT: 43 U/L — ABNORMAL HIGH (ref 0–35)
AST: 68 U/L — ABNORMAL HIGH (ref 0–37)
Albumin: 1.9 g/dL — ABNORMAL LOW (ref 3.5–5.2)
BUN: 49 mg/dL — ABNORMAL HIGH (ref 6–23)
CO2: 30 mEq/L (ref 19–32)
Calcium: 8.2 mg/dL — ABNORMAL LOW (ref 8.4–10.5)
Chloride: 95 mEq/L — ABNORMAL LOW (ref 96–112)
Creatinine, Ser: 1.98 mg/dL — ABNORMAL HIGH (ref 0.50–1.10)
GFR calc Af Amer: 31 mL/min — ABNORMAL LOW (ref >90)
GFR calc non Af Amer: 27 mL/min — ABNORMAL LOW (ref >90)
GLUCOSE: 185 mg/dL — AB (ref 70–99)
POTASSIUM: 4 meq/L (ref 3.7–5.3)
Sodium: 140 mEq/L (ref 137–147)
Total Bilirubin: 0.2 mg/dL — ABNORMAL LOW (ref 0.3–1.2)
Total Protein: 5.7 g/dL — ABNORMAL LOW (ref 6.0–8.3)

## 2014-04-07 LAB — GLUCOSE, CAPILLARY
GLUCOSE-CAPILLARY: 147 mg/dL — AB (ref 70–99)
Glucose-Capillary: 118 mg/dL — ABNORMAL HIGH (ref 70–99)

## 2014-04-07 LAB — LACTIC ACID, PLASMA
LACTIC ACID, VENOUS: 1.7 mmol/L (ref 0.5–2.2)
Lactic Acid, Venous: 0.9 mmol/L (ref 0.5–2.2)

## 2014-04-07 LAB — PROCALCITONIN: Procalcitonin: 1.51 ng/mL

## 2014-04-07 LAB — POC OCCULT BLOOD, ED: Fecal Occult Bld: NEGATIVE

## 2014-04-07 MED ORDER — BUDESONIDE 0.25 MG/2ML IN SUSP
RESPIRATORY_TRACT | Status: AC
  Filled 2014-04-07: qty 4

## 2014-04-07 MED ORDER — ALPRAZOLAM 0.5 MG PO TABS
1.0000 mg | ORAL_TABLET | Freq: Three times a day (TID) | ORAL | Status: DC
  Administered 2014-04-07 – 2014-04-08 (×2): 1 mg via ORAL
  Filled 2014-04-07 (×2): qty 2

## 2014-04-07 MED ORDER — ONDANSETRON HCL 4 MG PO TABS: 4.0000 mg | ORAL_TABLET | Freq: Four times a day (QID) | ORAL | Status: DC | PRN

## 2014-04-07 MED ORDER — HYDROCORTISONE NA SUCCINATE PF 100 MG IJ SOLR
50.0000 mg | Freq: Four times a day (QID) | INTRAMUSCULAR | Status: DC
  Administered 2014-04-07 – 2014-04-10 (×9): 50 mg via INTRAVENOUS
  Filled 2014-04-07: qty 2
  Filled 2014-04-07 (×4): qty 1
  Filled 2014-04-07: qty 2
  Filled 2014-04-07 (×5): qty 1
  Filled 2014-04-07: qty 2
  Filled 2014-04-07 (×8): qty 1

## 2014-04-07 MED ORDER — SODIUM CHLORIDE 0.9 % IJ SOLN
INTRAMUSCULAR | Status: AC
  Filled 2014-04-07: qty 250

## 2014-04-07 MED ORDER — SODIUM CHLORIDE 0.9 % IV BOLUS (SEPSIS)
1000.0000 mL | Freq: Once | INTRAVENOUS | Status: AC
  Administered 2014-04-07: 1000 mL via INTRAVENOUS

## 2014-04-07 MED ORDER — SIMVASTATIN 40 MG PO TABS: 40.0000 mg | ORAL_TABLET | Freq: Every day | ORAL | Status: DC

## 2014-04-07 MED ORDER — CITALOPRAM HYDROBROMIDE 20 MG PO TABS
20.0000 mg | ORAL_TABLET | Freq: Every day | ORAL | Status: DC
  Administered 2014-04-08 – 2014-05-10 (×32): 20 mg via ORAL
  Filled 2014-04-07 (×34): qty 1

## 2014-04-07 MED ORDER — DILTIAZEM HCL ER COATED BEADS 240 MG PO CP24
240.0000 mg | ORAL_CAPSULE | Freq: Every day | ORAL | Status: DC
  Administered 2014-04-08: 240 mg via ORAL
  Filled 2014-04-07: qty 1

## 2014-04-07 MED ORDER — ALBUTEROL SULFATE (2.5 MG/3ML) 0.083% IN NEBU
2.5000 mg | INHALATION_SOLUTION | Freq: Four times a day (QID) | RESPIRATORY_TRACT | Status: DC | PRN
  Administered 2014-04-07 – 2014-04-09 (×2): 2.5 mg via RESPIRATORY_TRACT
  Filled 2014-04-07 (×2): qty 3

## 2014-04-07 MED ORDER — INSULIN ASPART 100 UNIT/ML ~~LOC~~ SOLN
0.0000 [IU] | Freq: Three times a day (TID) | SUBCUTANEOUS | Status: DC
  Administered 2014-04-08: 2 [IU] via SUBCUTANEOUS

## 2014-04-07 MED ORDER — TIOTROPIUM BROMIDE MONOHYDRATE 18 MCG IN CAPS
18.0000 ug | ORAL_CAPSULE | Freq: Every day | RESPIRATORY_TRACT | Status: DC
  Administered 2014-04-08 – 2014-04-10 (×2): 18 ug via RESPIRATORY_TRACT
  Filled 2014-04-07 (×2): qty 5

## 2014-04-07 MED ORDER — LEVOTHYROXINE SODIUM 112 MCG PO TABS
112.0000 ug | ORAL_TABLET | Freq: Every day | ORAL | Status: DC
  Administered 2014-04-08 – 2014-04-23 (×14): 112 ug via ORAL
  Filled 2014-04-07 (×18): qty 1

## 2014-04-07 MED ORDER — ONDANSETRON HCL 4 MG/2ML IJ SOLN
4.0000 mg | Freq: Four times a day (QID) | INTRAMUSCULAR | Status: DC | PRN
  Administered 2014-04-08 – 2014-05-07 (×3): 4 mg via INTRAVENOUS
  Filled 2014-04-07 (×4): qty 2

## 2014-04-07 MED ORDER — BISACODYL 5 MG PO TBEC
5.0000 mg | DELAYED_RELEASE_TABLET | Freq: Every day | ORAL | Status: DC | PRN
  Filled 2014-04-07: qty 1

## 2014-04-07 MED ORDER — FLUMAZENIL 0.5 MG/5ML IV SOLN
0.2000 mg | Freq: Once | INTRAVENOUS | Status: AC
  Administered 2014-04-07: 12:00:00 via INTRAVENOUS
  Administered 2014-04-07: 0.2 mg via INTRAVENOUS
  Filled 2014-04-07: qty 5

## 2014-04-07 MED ORDER — GABAPENTIN 100 MG PO CAPS
100.0000 mg | ORAL_CAPSULE | Freq: Three times a day (TID) | ORAL | Status: DC
  Administered 2014-04-07 – 2014-04-08 (×4): 100 mg via ORAL
  Filled 2014-04-07 (×8): qty 1

## 2014-04-07 MED ORDER — CHOLECALCIFEROL 10 MCG (400 UNIT) PO TABS
400.0000 [IU] | ORAL_TABLET | Freq: Every day | ORAL | Status: DC
  Administered 2014-04-08 – 2014-05-17 (×38): 400 [IU] via ORAL
  Filled 2014-04-07 (×41): qty 1

## 2014-04-07 MED ORDER — FLUTICASONE PROPIONATE 50 MCG/ACT NA SUSP
1.0000 | Freq: Every day | NASAL | Status: DC
  Administered 2014-04-09 – 2014-04-10 (×2): 1 via NASAL
  Filled 2014-04-07: qty 16

## 2014-04-07 MED ORDER — FLUMAZENIL 0.5 MG/5ML IV SOLN
INTRAVENOUS | Status: AC
  Filled 2014-04-07: qty 5

## 2014-04-07 MED ORDER — PANTOPRAZOLE SODIUM 40 MG PO TBEC
40.0000 mg | DELAYED_RELEASE_TABLET | Freq: Every day | ORAL | Status: DC
  Administered 2014-04-08: 40 mg via ORAL
  Filled 2014-04-07: qty 1

## 2014-04-07 MED ORDER — BUDESONIDE 0.25 MG/2ML IN SUSP
0.2500 mg | Freq: Two times a day (BID) | RESPIRATORY_TRACT | Status: DC
  Administered 2014-04-07 – 2014-04-16 (×17): 0.25 mg via RESPIRATORY_TRACT
  Filled 2014-04-07 (×21): qty 2

## 2014-04-07 MED ORDER — SODIUM CHLORIDE 0.9 % IV SOLN
INTRAVENOUS | Status: DC
  Administered 2014-04-07: 16:00:00 via INTRAVENOUS

## 2014-04-07 MED ORDER — SODIUM CHLORIDE 0.9 % IV SOLN
INTRAVENOUS | Status: DC
  Administered 2014-04-08 – 2014-04-10 (×3): via INTRAVENOUS

## 2014-04-07 MED ORDER — ATORVASTATIN CALCIUM 20 MG PO TABS
20.0000 mg | ORAL_TABLET | Freq: Every day | ORAL | Status: DC
  Administered 2014-04-07 – 2014-04-17 (×10): 20 mg via ORAL
  Filled 2014-04-07 (×12): qty 1

## 2014-04-07 MED ORDER — LEVOFLOXACIN 500 MG PO TABS: 500.0000 mg | ORAL_TABLET | Freq: Every day | ORAL | Status: DC

## 2014-04-07 MED ORDER — SODIUM CHLORIDE 0.9 % IV BOLUS (SEPSIS)
500.0000 mL | Freq: Once | INTRAVENOUS | Status: AC
  Administered 2014-04-07: 500 mL via INTRAVENOUS

## 2014-04-07 MED ORDER — HYDROCODONE-ACETAMINOPHEN 10-325 MG PO TABS: 1.0000 | ORAL_TABLET | Freq: Three times a day (TID) | ORAL | Status: DC

## 2014-04-07 MED ORDER — INSULIN GLARGINE 100 UNIT/ML ~~LOC~~ SOLN
10.0000 [IU] | Freq: Two times a day (BID) | SUBCUTANEOUS | Status: DC
  Administered 2014-04-07 – 2014-04-13 (×12): 10 [IU] via SUBCUTANEOUS
  Filled 2014-04-07 (×18): qty 0.1

## 2014-04-07 MED ORDER — NYSTATIN 100000 UNIT/ML MT SUSP
5.0000 mL | Freq: Four times a day (QID) | OROMUCOSAL | Status: DC
  Administered 2014-04-07 – 2014-04-13 (×19): 500000 [IU] via ORAL
  Filled 2014-04-07 (×26): qty 5

## 2014-04-07 MED ORDER — HEPARIN SODIUM (PORCINE) 5000 UNIT/ML IJ SOLN
5000.0000 [IU] | Freq: Three times a day (TID) | INTRAMUSCULAR | Status: DC
  Administered 2014-04-07 – 2014-04-08 (×3): 5000 [IU] via SUBCUTANEOUS
  Filled 2014-04-07 (×3): qty 1

## 2014-04-07 MED ORDER — ALBUTEROL SULFATE HFA 108 (90 BASE) MCG/ACT IN AERS
1.0000 | INHALATION_SPRAY | RESPIRATORY_TRACT | Status: DC | PRN
Start: 2014-04-07 — End: 2014-04-07
  Filled 2014-04-07: qty 6.7

## 2014-04-07 MED ORDER — TIOTROPIUM BROMIDE MONOHYDRATE 18 MCG IN CAPS
ORAL_CAPSULE | RESPIRATORY_TRACT | Status: AC
  Filled 2014-04-07: qty 5

## 2014-04-07 MED ORDER — GUAIFENESIN ER 600 MG PO TB12
600.0000 mg | ORAL_TABLET | Freq: Every day | ORAL | Status: DC
  Administered 2014-04-08 – 2014-04-22 (×10): 600 mg via ORAL
  Filled 2014-04-07 (×16): qty 1

## 2014-04-07 MED ORDER — HYDROCODONE-ACETAMINOPHEN 10-325 MG PO TABS
1.0000 | ORAL_TABLET | Freq: Three times a day (TID) | ORAL | Status: DC | PRN
  Administered 2014-04-08 (×2): 1 via ORAL
  Filled 2014-04-07 (×2): qty 1

## 2014-04-07 MED ORDER — PANTOPRAZOLE SODIUM 40 MG PO TBEC: 40.0000 mg | DELAYED_RELEASE_TABLET | Freq: Every day | ORAL | Status: DC

## 2014-04-07 MED ORDER — LEVOFLOXACIN 500 MG PO TABS
500.0000 mg | ORAL_TABLET | Freq: Every day | ORAL | Status: DC
  Administered 2014-04-08 – 2014-04-09 (×2): 500 mg via ORAL
  Filled 2014-04-07 (×3): qty 1

## 2014-04-07 MED ORDER — NALOXONE HCL 0.4 MG/ML IJ SOLN
0.4000 mg | Freq: Once | INTRAMUSCULAR | Status: AC
  Administered 2014-04-07: 0.4 mg via INTRAVENOUS
  Filled 2014-04-07: qty 1

## 2014-04-07 MED ORDER — SALINE SPRAY 0.65 % NA SOLN: 1.0000 | NASAL | Status: DC | PRN

## 2014-04-07 MED ORDER — INSULIN ASPART 100 UNIT/ML ~~LOC~~ SOLN: 0.0000 [IU] | Freq: Every day | SUBCUTANEOUS | Status: DC

## 2014-04-07 MED ORDER — APIXABAN 5 MG PO TABS: 5.0000 mg | ORAL_TABLET | Freq: Two times a day (BID) | ORAL | Status: DC

## 2014-04-07 MED ORDER — PREDNISONE 10 MG PO TABS: 10.0000 mg | ORAL_TABLET | Freq: Every day | ORAL | Status: DC

## 2014-04-07 NOTE — Progress Notes (Signed)
eLink Physician-Brief Progress Note Patient Name: Joanna Perez DOB: April 28, 1956 MRN: [id number]  Date of Service  04/07/2014   HPI/Events of Note   Hypotensive Noted to be on low dose Prednisone, not clear the duration of this Suspect relative adrenal insufficiency   eICU Interventions   Prednisone 10 d/c'd Hydrocortisone 50 q6h ordered    Intervention Category Major Interventions: Hypotension - evaluation and management  Reyli Schroth 04/07/2014, 7:19 PM

## 2014-04-07 NOTE — ED Notes (Signed)
Pt comes from home via EMS after family called out due to altered mental status.. Family states symptoms began approximately 0900 this morning. EMS states pt was leaning toward left side, drooling with small facial droop.

## 2014-04-07 NOTE — ED Provider Notes (Signed)
CSN: [id number]     Arrival date & time 04/07/14  1056 History   First MD Initiated Contact with Patient 04/07/14 1102     Chief Complaint  Patient presents with  . Altered Mental Status   HPI This chart was scribed for Doree Albee, MD by Thea Alken, ED Scribe. This patient was seen in room APA10/APA10 and the patient's care was started at 3:37 PM.  HPI Comments:   level 5 caveat for urgent need for intervention. SHANIECE BUSSA is a 58 y.o. female who present to the Emergency Department complaining of AMS. Husband states the last 3 days pt has not been able to move efficiently at home. This morning she walked into the kitchen, had a syncopal spell, and was unable to get up. Pt has multiple medical problems including COPD, myocardial infarction, congestive heart failure, diabetes, hypertension, hyperlipidemia, back pain.. Husband believes she may be abusing her pain or "nerve" mediaction.   No chest pain, dyspnea, dysuria, cough, fever, chills  Past Medical History  Diagnosis Date  . Asthma   . COPD (chronic obstructive pulmonary disease)   . Hypothyroidism   . Essential hypertension, benign   . Hyperlipidemia   . Type 2 diabetes mellitus   . Atrial fibrillation   . MI (myocardial infarction)     2012  . Pericardial effusion 09/09/2013  . CHF (congestive heart failure)   . On home O2     4L N/C continuous  . Thoracic compression fracture 03/23/2014  . Ventral hernia    Past Surgical History  Procedure Laterality Date  . Hernia repair    . Abdominal hysterectomy    . Stomach surgery      Wound vac currently in place  . Esophagogastroduodenoscopy  11/11/2011    Procedure: ESOPHAGOGASTRODUODENOSCOPY (EGD);  Surgeon: Rogene Houston, MD;  Location: [facility];  Service: Endoscopy;  Laterality: N/A;  . Foreign body removal  11/11/2011    Procedure: FOREIGN BODY REMOVAL;  Surgeon: Rogene Houston, MD;  Location: [facility];  Service: Endoscopy;  Laterality: N/A;  .  Tracheal surgery    . Abdominal surgery     Family History  Problem Relation Age of Onset  . Diabetes Mother    History  Substance Use Topics  . Smoking status: Former Smoker -- 1.50 packs/day for 35 years    Types: Cigarettes    Quit date: 11/27/2013  . Smokeless tobacco: Never Used  . Alcohol Use: Yes     Comment: 1-2 beers nightly   OB History   Grav Para Term Preterm Abortions TAB SAB Ect Mult Living   2 2 2       1      Review of Systems A complete 10 system review of systems was obtained and all systems are negative except as noted in the HPI and PMH.   Allergies  Amoxicillin; Bactrim; Erythromycin; Keflex; and Penicillins  Home Medications   Prior to Admission medications   Medication Sig Start Date End Date Taking? Authorizing Provider  albuterol (PROVENTIL HFA;VENTOLIN HFA) 108 (90 BASE) MCG/ACT inhaler Inhale 1 puff into the lungs every 4 (four) hours as needed for wheezing or shortness of breath.    Historical Provider, MD  albuterol (PROVENTIL) (2.5 MG/3ML) 0.083% nebulizer solution Take 3 mLs (2.5 mg total) by nebulization every 6 (six) hours as needed for wheezing or shortness of breath. 01/22/14   Kathie Dike, MD  ALPRAZolam Duanne Moron) 1 MG tablet Take 0.5-1 mg by mouth  3 (three) times daily.    Historical Provider, MD  apixaban (ELIQUIS) 5 MG TABS tablet Take 1 tablet (5 mg total) by mouth 2 (two) times daily. 09/29/13   Lendon Colonel, NP  Ascorbic Acid (VITAMIN C) 1000 MG tablet Take 1,000 mg by mouth daily.     Historical Provider, MD  bisacodyl (DULCOLAX) 5 MG EC tablet Take 5 mg by mouth daily as needed for moderate constipation.    Historical Provider, MD  budesonide (PULMICORT) 0.25 MG/2ML nebulizer solution Take 2 mLs (0.25 mg total) by nebulization 2 (two) times daily. 03/28/14   Radene Gunning, NP  cholecalciferol 400 UNITS tablet Take 1 tablet (400 Units total) by mouth daily. 03/28/14   Radene Gunning, NP  citalopram (CELEXA) 20 MG tablet Take 20 mg by  mouth daily.    Historical Provider, MD  diltiazem (CARDIZEM CD) 240 MG 24 hr capsule Take 1 capsule (240 mg total) by mouth daily. 09/29/13   Lendon Colonel, NP  fexofenadine (ALLEGRA) 180 MG tablet Take 180 mg by mouth 2 (two) times daily.    Historical Provider, MD  fluticasone (FLONASE) 50 MCG/ACT nasal spray Place 1 spray into both nostrils.     Historical Provider, MD  furosemide (LASIX) 20 MG tablet Take 2 tablets (40 mg total) by mouth daily. 01/22/14   Radene Gunning, NP  gabapentin (NEURONTIN) 100 MG capsule Take 100 mg by mouth 3 (three) times daily.  08/15/13 08/15/14  Historical Provider, MD  guaiFENesin (MUCINEX) 600 MG 12 hr tablet Take 1 tablet (600 mg total) by mouth 2 (two) times daily. 03/15/14   Oswald Hillock, MD  HYDROcodone-acetaminophen (NORCO) 10-325 MG per tablet Take 1 tablet by mouth every 12 (twelve) hours as needed for severe pain. 11/30/13   Thurnell Lose, MD  insulin glargine (LANTUS) 100 UNIT/ML injection Inject 0.1 mLs (10 Units total) into the skin at bedtime. 03/15/14   Oswald Hillock, MD  levothyroxine (SYNTHROID, LEVOTHROID) 112 MCG tablet Take 112 mcg by mouth daily before breakfast.    Historical Provider, MD  metFORMIN (GLUCOPHAGE) 500 MG tablet Take 1 tablet (500 mg total) by mouth 2 (two) times daily with a meal. 12/22/13   Ripudeep K Rai, MD  nystatin (MYCOSTATIN) 100000 UNIT/ML suspension Take 5 mLs (500,000 Units total) by mouth 4 (four) times daily. 03/28/14   Radene Gunning, NP  pantoprazole (PROTONIX) 40 MG tablet Take 40 mg by mouth daily.     Historical Provider, MD  predniSONE (DELTASONE) 10 MG tablet Take 6 tabs daily for 2 days starting 03/29/14 then take 5 tabs daily for 2 days then take 4 tabs daily for 2 das then take 1 tab daily indefinitely. 03/28/14   Radene Gunning, NP  ramipril (ALTACE) 5 MG capsule Take 1 capsule (5 mg total) by mouth daily. 01/22/14   Radene Gunning, NP  simvastatin (ZOCOR) 40 MG tablet Take 40 mg by mouth daily at 6 PM.     Historical  Provider, MD  sodium chloride (OCEAN) 0.65 % SOLN nasal spray Place 1 spray into both nostrils as needed for congestion (also available OTC). 12/22/13   Ripudeep Krystal Eaton, MD  theophylline (THEODUR) 200 MG 12 hr tablet Take 400 mg by mouth 2 (two) times daily.    Historical Provider, MD  vitamin E 1000 UNIT capsule Take 1,000 Units by mouth daily.    Historical Provider, MD   BP 86/42  Pulse 95  Temp(Src) 98 F (  36.7 C) (Rectal)  Resp 9  SpO2 91% Physical Exam  Nursing note and vitals reviewed. Constitutional: She is oriented to person, place, and time. She appears well-developed and well-nourished.  Pale, obese, and dehydrated  HENT:  Head: Normocephalic and atraumatic.  Eyes: Conjunctivae and EOM are normal. Pupils are equal, round, and reactive to light.  Neck: Normal range of motion. Neck supple.  Cardiovascular: Normal rate, regular rhythm and normal heart sounds.   Pulmonary/Chest: Effort normal and breath sounds normal.  Abdominal: Soft. Bowel sounds are normal.  Genitourinary:  Rectal exam negative for hemoglobin  Musculoskeletal: Normal range of motion.  Neurological: She is alert and oriented to person, place, and time.  Skin: There is pallor.  Psychiatric: She has a normal mood and affect. Her behavior is normal.    ED Course  Procedures  11:47 AM- Pt will be admitted into hospital. Discussed treatment plan which includes CBC and UA  Labs Review Labs Reviewed  CBC WITH DIFFERENTIAL - Abnormal; Notable for the following:    WBC 13.0 (*)    RBC 3.57 (*)    Hemoglobin 9.0 (*)    HCT 29.6 (*)    MCH 25.2 (*)    RDW 19.9 (*)    Neutrophils Relative % 93 (*)    Neutro Abs 12.0 (*)    Lymphocytes Relative 3 (*)    Lymphs Abs 0.4 (*)    All other components within normal limits  COMPREHENSIVE METABOLIC PANEL - Abnormal; Notable for the following:    Chloride 95 (*)    Glucose, Bld 185 (*)    BUN 49 (*)    Creatinine, Ser 1.98 (*)    Calcium 8.2 (*)    Total  Protein 5.7 (*)    Albumin 1.9 (*)    AST 68 (*)    ALT 43 (*)    Total Bilirubin 0.2 (*)    GFR calc non Af Amer 27 (*)    GFR calc Af Amer 31 (*)    All other components within normal limits  CULTURE, BLOOD (ROUTINE X 2)  CULTURE, BLOOD (ROUTINE X 2)  URINE CULTURE  URINALYSIS, ROUTINE W REFLEX MICROSCOPIC  LACTIC ACID, PLASMA  LACTIC ACID, PLASMA  PROCALCITONIN  POC OCCULT BLOOD, ED    Imaging Review Ct Head Wo Contrast  04/07/2014   CLINICAL DATA:  Altered mental status  EXAM: CT HEAD WITHOUT CONTRAST  TECHNIQUE: Contiguous axial images were obtained from the base of the skull through the vertex without intravenous contrast.  COMPARISON:  CT HEAD W/O CM dated 12/18/2012  FINDINGS: The patient is angled on the gantry. Motion artifact degrades imaging. Images were repeated but motion persisted. No acute hemorrhage, infarct, or mass lesion is identified. Mild cortical volume loss with proportional ventricular prominence reidentified. Orbits are unremarkable. Mild pansinusitis. No skull fracture.  IMPRESSION: No acute intracranial findings.  Mild pansinusitis.   Electronically Signed   By: Conchita Paris M.D.   On: 04/07/2014 11:48   Dg Chest Port 1 View  04/07/2014   CLINICAL DATA:  Hypertension and shortness of breath  EXAM: PORTABLE CHEST - 1 VIEW  COMPARISON:  03/23/2014  FINDINGS: Apparent hazy opacity at the right apex is likely overlapping soft tissues given the underlying lung markings appear preserved/symmetric. Normal heart size and mediastinal contours for technique. No definite consolidation, edema, effusion, or pneumothorax.  IMPRESSION: No edema or definitive pneumonia.  Asymmetric density at the right apex is favored technical, but if no resolution of symptoms  recommend follow-up chest x-ray.   Electronically Signed   By: Jorje Guild M.D.   On: 04/07/2014 11:18     EKG Interpretation   Date/Time:  Saturday Apr 07 2014 11:03:08 EDT Ventricular Rate:  104 PR Interval:   147 QRS Duration: 65 QT Interval:  377 QTC Calculation: 496 R Axis:   18 Text Interpretation:  Sinus tachycardia Low voltage, precordial leads  Borderline prolonged QT interval Confirmed by Lacinda Axon  MD, Sayre Mazor ([id number]) on  04/07/2014 12:35:03 PM     CRITICAL CARE Performed by: Nat Christen Total critical care time: 40 Critical care time was exclusive of separately billable procedures and treating other patients. Critical care was necessary to treat or prevent imminent or life-threatening deterioration. Critical care was time spent personally by me on the following activities: development of treatment plan with patient and/or surrogate as well as nursing, discussions with consultants, evaluation of patient's response to treatment, examination of patient, obtaining history from patient or surrogate, ordering and performing treatments and interventions, ordering and review of laboratory studies, ordering and review of radiographic studies, pulse oximetry and re-evaluation of patient's condition. MDM   Final diagnoses:  COPD (chronic obstructive pulmonary disease)  Hypotension  Anemia  Blood pressure is still soft, but pt is more alert.  Color has improved.  Lactic acid normal.   CXR and UA show no obvious infection.  Good urinary output.  Patient is anemic. Rectal negative for heme.   Admit to general medicine  I personally performed the services described in this documentation, which was scribed in my presence. The recorded information has been reviewed and is accurate.     Nat Christen, MD 04/07/14 1540

## 2014-04-07 NOTE — ED Notes (Signed)
Pt on [place] at 4 liters oxygen

## 2014-04-07 NOTE — Progress Notes (Signed)
Spoke with Dr Anastasio Champion via phone regarding hypotension. NS bolus over 1 hour ordered, and infusing.

## 2014-04-07 NOTE — ED Notes (Signed)
Earrings and necklace and charm given to husband.  Removed when pt was over at ct.

## 2014-04-07 NOTE — H&P (Signed)
[facility] History and Physical  Joanna Perez NFA:[id number] DOB: [birth year] DOA: 04/07/2014  Referring physician: Dr. Lacinda Axon, ER PCP: Glo Herring., MD   Chief Complaint: Hypotension, weakness, altered mental status.  HPI: Joanna Perez is a 58 y.o. female  This is a 58 year old lady who was recently discharged from the hospital 10 days ago. The patient presented with COPD exacerbation. The patient is known to have atrial fibrillation and is on Eliquis for anticoagulation. Today, the patient apparently was less responsive him according to the husband. She apparently had some sort of syncopal spell in the kitchen and was unable to get up. When she was evaluated in the emergency room, she was found to be hypotensive. She is now being admitted for further investigation and management. The patient herself is somewhat drowsy but is able to respond to some questions, but not all.   Review of Systems:  Unable to obtain review of systems because of her altered mental status.  Past Medical History  Diagnosis Date  . Asthma   . COPD (chronic obstructive pulmonary disease)   . Hypothyroidism   . Essential hypertension, benign   . Hyperlipidemia   . Type 2 diabetes mellitus   . Atrial fibrillation   . MI (myocardial infarction)     2012  . Pericardial effusion 09/09/2013  . CHF (congestive heart failure)   . On home O2     4L N/C continuous  . Thoracic compression fracture 03/23/2014  . Ventral hernia    Past Surgical History  Procedure Laterality Date  . Hernia repair    . Abdominal hysterectomy    . Stomach surgery      Wound vac currently in place  . Esophagogastroduodenoscopy  11/11/2011    Procedure: ESOPHAGOGASTRODUODENOSCOPY (EGD);  Surgeon: Rogene Houston, MD;  Location: [facility];  Service: Endoscopy;  Laterality: N/A;  . Foreign body removal  11/11/2011    Procedure: FOREIGN BODY REMOVAL;  Surgeon: Rogene Houston, MD;  Location: [facility];  Service:  Endoscopy;  Laterality: N/A;  . Tracheal surgery    . Abdominal surgery     Social History:  reports that she quit smoking about 4 months ago. Her smoking use included Cigarettes. She has a 52.5 pack-year smoking history. She has never used smokeless tobacco. She reports that she drinks alcohol. She reports that she does not use illicit drugs.  Allergies  Allergen Reactions  . Amoxicillin Hives  . Bactrim [Sulfamethoxazole-Tmp Ds] Hives  . Erythromycin Hives  . Keflex [Cephalexin] Hives  . Penicillins Itching and Swelling    Sweating    Family History  Problem Relation Age of Onset  . Diabetes Mother      Prior to Admission medications   Medication Sig Start Date End Date Taking? Authorizing Provider  albuterol (PROVENTIL HFA;VENTOLIN HFA) 108 (90 BASE) MCG/ACT inhaler Inhale 1 puff into the lungs every 4 (four) hours as needed for wheezing or shortness of breath.   Yes Historical Provider, MD  albuterol (PROVENTIL) (2.5 MG/3ML) 0.083% nebulizer solution Take 3 mLs (2.5 mg total) by nebulization every 6 (six) hours as needed for wheezing or shortness of breath. 01/22/14  Yes Kathie Dike, MD  ALPRAZolam Duanne Moron) 1 MG tablet Take 1 mg by mouth 3 (three) times daily.    Yes Historical Provider, MD  apixaban (ELIQUIS) 5 MG TABS tablet Take 1 tablet (5 mg total) by mouth 2 (two) times daily. 09/29/13  Yes Lendon Colonel, NP  bisacodyl (DULCOLAX) 5  MG EC tablet Take 5 mg by mouth daily as needed for moderate constipation.   Yes Historical Provider, MD  budesonide (PULMICORT) 0.25 MG/2ML nebulizer solution Take 2 mLs (0.25 mg total) by nebulization 2 (two) times daily. 03/28/14  Yes Radene Gunning, NP  cholecalciferol 400 UNITS tablet Take 1 tablet (400 Units total) by mouth daily. 03/28/14  Yes Lezlie Octave Black, NP  citalopram (CELEXA) 20 MG tablet Take 20 mg by mouth daily.   Yes Historical Provider, MD  diltiazem (CARDIZEM CD) 240 MG 24 hr capsule Take 1 capsule (240 mg total) by mouth daily.  09/29/13  Yes Lendon Colonel, NP  fluticasone (FLONASE) 50 MCG/ACT nasal spray Place 1 spray into both nostrils.    Yes Historical Provider, MD  furosemide (LASIX) 20 MG tablet Take 40 mg by mouth daily.   Yes Historical Provider, MD  gabapentin (NEURONTIN) 100 MG capsule Take 100 mg by mouth 3 (three) times daily.  08/15/13 08/15/14 Yes Historical Provider, MD  guaiFENesin (MUCINEX) 600 MG 12 hr tablet Take 600 mg by mouth daily.   Yes Historical Provider, MD  HYDROcodone-acetaminophen (NORCO) 10-325 MG per tablet Take 1 tablet by mouth 3 (three) times daily.   Yes Historical Provider, MD  insulin glargine (LANTUS) 100 UNIT/ML injection Inject 10 Units into the skin 2 (two) times daily.   Yes Historical Provider, MD  levofloxacin (LEVAQUIN) 500 MG tablet Take 500 mg by mouth daily.   Yes Historical Provider, MD  levothyroxine (SYNTHROID, LEVOTHROID) 112 MCG tablet Take 112 mcg by mouth daily before breakfast.   Yes Historical Provider, MD  metFORMIN (GLUCOPHAGE) 500 MG tablet Take 1 tablet (500 mg total) by mouth 2 (two) times daily with a meal. 12/22/13  Yes Ripudeep K Rai, MD  nystatin (MYCOSTATIN) 100000 UNIT/ML suspension Take 5 mLs (500,000 Units total) by mouth 4 (four) times daily. 03/28/14  Yes Lezlie Octave Black, NP  pantoprazole (PROTONIX) 40 MG tablet Take 40 mg by mouth daily.    Yes Historical Provider, MD  predniSONE (DELTASONE) 10 MG tablet Take 6 tabs daily for 2 days starting 03/29/14 then take 5 tabs daily for 2 days then take 4 tabs daily for 2 das then take 1 tab daily indefinitely. 03/28/14  Yes Lezlie Octave Black, NP  ramipril (ALTACE) 5 MG capsule Take 1 capsule (5 mg total) by mouth daily. 01/22/14  Yes Lezlie Octave Black, NP  simvastatin (ZOCOR) 40 MG tablet Take 40 mg by mouth daily at 6 PM.    Yes Historical Provider, MD  sodium chloride (OCEAN) 0.65 % SOLN nasal spray Place 1 spray into both nostrils as needed for congestion (also available OTC). 12/22/13  Yes Ripudeep Krystal Eaton, MD  theophylline  (THEODUR) 200 MG 12 hr tablet Take 400 mg by mouth 2 (two) times daily.   Yes Historical Provider, MD  tiotropium (SPIRIVA) 18 MCG inhalation capsule Place 18 mcg into inhaler and inhale daily.   Yes Historical Provider, MD   Physical Exam: Filed Vitals:   04/07/14 1545  BP: 73/40  Pulse: 100  Temp:   Resp: 15    BP 73/40  Pulse 100  Temp(Src) 98 F (36.7 C) (Rectal)  Resp 15  SpO2 92%  General:  Appears calm and comfortable. Peripheries are warm and pulses are somewhat bounding but blood pressure is soft. She is not clammy. She does look clinically dehydrated. Eyes: PERRL, normal lids, irises & conjunctiva ENT: grossly normal hearing, lips & tongue Neck: no LAD, masses or  thyromegaly Cardiovascular: RRR, no m/r/g. No LE edema. Telemetry: SR, no arrhythmias  Respiratory: CTA bilaterally, no w/r/r. Normal respiratory effort. Abdomen: Tender abdomen in the generalized fashion. No obvious abdominal aortic aneurysm felt. Bowel sounds are somewhat scanty. Skin: no rash or induration seen on limited exam  Neurologic: grossly non-focal.          Labs on Admission:  Basic Metabolic Panel:  Recent Labs Lab 04/07/14 1202  NA 140  K 4.0  CL 95*  CO2 30  GLUCOSE 185*  BUN 49*  CREATININE 1.98*  CALCIUM 8.2*   Liver Function Tests:  Recent Labs Lab 04/07/14 1202  AST 68*  ALT 43*  ALKPHOS 52  BILITOT 0.2*  PROT 5.7*  ALBUMIN 1.9*   No results found for this basename: LIPASE, AMYLASE,  in the last 168 hours No results found for this basename: AMMONIA,  in the last 168 hours CBC:  Recent Labs Lab 04/07/14 1202  WBC 13.0*  NEUTROABS 12.0*  HGB 9.0*  HCT 29.6*  MCV 82.9  PLT 285   Cardiac Enzymes: No results found for this basename: CKTOTAL, CKMB, CKMBINDEX, TROPONINI,  in the last 168 hours  BNP (last 3 results)  Recent Labs  02/28/14 1931 03/13/14 1137 03/23/14 1046  PROBNP 784.0* 439.0* 369.0*   CBG: No results found for this basename: GLUCAP,   in the last 168 hours  Radiological Exams on Admission: Ct Head Wo Contrast  04/07/2014   CLINICAL DATA:  Altered mental status  EXAM: CT HEAD WITHOUT CONTRAST  TECHNIQUE: Contiguous axial images were obtained from the base of the skull through the vertex without intravenous contrast.  COMPARISON:  CT HEAD W/O CM dated 12/18/2012  FINDINGS: The patient is angled on the gantry. Motion artifact degrades imaging. Images were repeated but motion persisted. No acute hemorrhage, infarct, or mass lesion is identified. Mild cortical volume loss with proportional ventricular prominence reidentified. Orbits are unremarkable. Mild pansinusitis. No skull fracture.  IMPRESSION: No acute intracranial findings.  Mild pansinusitis.   Electronically Signed   By: Conchita Paris M.D.   On: 04/07/2014 11:48   Dg Chest Port 1 View  04/07/2014   CLINICAL DATA:  Hypertension and shortness of breath  EXAM: PORTABLE CHEST - 1 VIEW  COMPARISON:  03/23/2014  FINDINGS: Apparent hazy opacity at the right apex is likely overlapping soft tissues given the underlying lung markings appear preserved/symmetric. Normal heart size and mediastinal contours for technique. No definite consolidation, edema, effusion, or pneumothorax.  IMPRESSION: No edema or definitive pneumonia.  Asymmetric density at the right apex is favored technical, but if no resolution of symptoms recommend follow-up chest x-ray.   Electronically Signed   By: Jorje Guild M.D.   On: 04/07/2014 11:18    EKG: Independently reviewed. Normal sinus rhythm, sinus tachycardia, no acute ST-T wave changes.  Assessment/Plan   1. Hypotension, unclear etiology. 2. Acute renal failure. 3. Dehydration causing #2. 4. Abdominal pain and tenderness, unclear etiology although patient claims this is more chronic. 5. Hypothyroidism. 6. Type 2 diabetes mellitus. 7. Chronic back pain on chronic narcotic medications. 8. Chronic diastolic congestive heart failure, currently  compensated. 9. COPD and chronic respiratory failure, stable. 10. Paroxysmal atrial fibrillation, currently in sinus rhythm. Patient is on chronic anticoagulation therapy with Eliquis.  Plan: 1. Admit to step down unit. 2. Aggressive  fluid resuscitation. 3. Discontinue Eliquis in view of the anemia. Hemoccult stools. 4. CT scan of the abdomen to make sure there is no intra-abdominal pathology  accounting for the drop in hemoglobin. 5. Consider GI consultation depending on hemoglobin. Monitor hemoglobin closely.  Further recommendations will depend on patient's hospital progress.  Code Status: Full code.  Family Communication: I discussed the plan with the patient and patient's husband at the bedside.   Disposition Plan: Home when medically stable.  Time spent: 60 minutes.  [facility] Pager [phone].

## 2014-04-08 ENCOUNTER — Encounter (HOSPITAL_COMMUNITY): Payer: Self-pay | Admitting: *Deleted

## 2014-04-08 ENCOUNTER — Inpatient Hospital Stay (HOSPITAL_COMMUNITY): Payer: Medicaid Other | Admitting: Anesthesiology

## 2014-04-08 ENCOUNTER — Encounter (HOSPITAL_COMMUNITY)
Admission: EM | Disposition: A | Discharge status: Skilled Nursing Facility | Payer: Self-pay | Source: Home / Self Care | Attending: Internal Medicine

## 2014-04-08 ENCOUNTER — Encounter (HOSPITAL_COMMUNITY): Payer: Medicaid Other | Admitting: Anesthesiology

## 2014-04-08 ENCOUNTER — Inpatient Hospital Stay (HOSPITAL_COMMUNITY): Payer: Medicaid Other

## 2014-04-08 DIAGNOSIS — S22009A Unspecified fracture of unspecified thoracic vertebra, initial encounter for closed fracture: Principal | ICD-10-CM

## 2014-04-08 DIAGNOSIS — R7881 Bacteremia: Secondary | ICD-10-CM

## 2014-04-08 DIAGNOSIS — G9340 Encephalopathy, unspecified: Secondary | ICD-10-CM

## 2014-04-08 DIAGNOSIS — D649 Anemia, unspecified: Secondary | ICD-10-CM

## 2014-04-08 DIAGNOSIS — G8220 Paraplegia, unspecified: Secondary | ICD-10-CM

## 2014-04-08 DIAGNOSIS — M4804 Spinal stenosis, thoracic region: Secondary | ICD-10-CM

## 2014-04-08 HISTORY — PX: LAMINECTOMY: SHX219

## 2014-04-08 LAB — CBC
HCT: 29.1 % — ABNORMAL LOW (ref 36.0–46.0)
HEMOGLOBIN: 8.8 g/dL — AB (ref 12.0–15.0)
MCH: 24.9 pg — AB (ref 26.0–34.0)
MCHC: 30.2 g/dL (ref 30.0–36.0)
MCV: 82.4 fL (ref 78.0–100.0)
Platelets: 318 10*3/uL (ref 150–400)
RBC: 3.53 MIL/uL — ABNORMAL LOW (ref 3.87–5.11)
RDW: 19.8 % — AB (ref 11.5–15.5)
WBC: 13.6 10*3/uL — ABNORMAL HIGH (ref 4.0–10.5)

## 2014-04-08 LAB — COMPREHENSIVE METABOLIC PANEL
ALBUMIN: 1.8 g/dL — AB (ref 3.5–5.2)
ALK PHOS: 48 U/L (ref 39–117)
ALT: 34 U/L (ref 0–35)
AST: 44 U/L — AB (ref 0–37)
BUN: 37 mg/dL — ABNORMAL HIGH (ref 6–23)
CALCIUM: 7.9 mg/dL — AB (ref 8.4–10.5)
CO2: 30 mEq/L (ref 19–32)
Chloride: 100 mEq/L (ref 96–112)
Creatinine, Ser: 1.19 mg/dL — ABNORMAL HIGH (ref 0.50–1.10)
GFR calc Af Amer: 58 mL/min — ABNORMAL LOW (ref >90)
GFR calc non Af Amer: 50 mL/min — ABNORMAL LOW (ref >90)
GLUCOSE: 102 mg/dL — AB (ref 70–99)
POTASSIUM: 4 meq/L (ref 3.7–5.3)
SODIUM: 142 meq/L (ref 137–147)
Total Bilirubin: 0.2 mg/dL — ABNORMAL LOW (ref 0.3–1.2)
Total Protein: 5.4 g/dL — ABNORMAL LOW (ref 6.0–8.3)

## 2014-04-08 LAB — CK TOTAL AND CKMB (NOT AT ARMC)
CK TOTAL: 76 U/L (ref 7–177)
CK, MB: 2.7 ng/mL (ref 0.3–4.0)
RELATIVE INDEX: INVALID (ref 0.0–2.5)

## 2014-04-08 LAB — GLUCOSE, CAPILLARY
GLUCOSE-CAPILLARY: 123 mg/dL — AB (ref 70–99)
GLUCOSE-CAPILLARY: 157 mg/dL — AB (ref 70–99)
Glucose-Capillary: 124 mg/dL — ABNORMAL HIGH (ref 70–99)
Glucose-Capillary: 117 mg/dL — ABNORMAL HIGH (ref 70–99)

## 2014-04-08 LAB — PREPARE RBC (CROSSMATCH)

## 2014-04-08 LAB — SURGICAL PCR SCREEN
MRSA, PCR: NEGATIVE
STAPHYLOCOCCUS AUREUS: NEGATIVE

## 2014-04-08 SURGERY — THORACIC LAMINECTOMY FOR TUMOR
Anesthesia: General | Site: Back

## 2014-04-08 MED ORDER — FENTANYL CITRATE 0.05 MG/ML IJ SOLN
INTRAMUSCULAR | Status: AC
  Filled 2014-04-08: qty 5

## 2014-04-08 MED ORDER — PROPOFOL 10 MG/ML IV BOLUS
INTRAVENOUS | Status: AC
  Filled 2014-04-08: qty 20

## 2014-04-08 MED ORDER — VECURONIUM BROMIDE 10 MG IV SOLR
INTRAVENOUS | Status: DC | PRN
  Administered 2014-04-08 – 2014-04-09 (×2): 4 mg via INTRAVENOUS

## 2014-04-08 MED ORDER — ONDANSETRON HCL 4 MG/2ML IJ SOLN
INTRAMUSCULAR | Status: AC
  Filled 2014-04-08: qty 2

## 2014-04-08 MED ORDER — SODIUM CHLORIDE 0.9 % IV SOLN
INTRAVENOUS | Status: DC | PRN
  Administered 2014-04-08: 22:00:00 via INTRAVENOUS

## 2014-04-08 MED ORDER — ONDANSETRON HCL 4 MG/2ML IJ SOLN
INTRAMUSCULAR | Status: DC | PRN
  Administered 2014-04-08: 4 mg via INTRAVENOUS

## 2014-04-08 MED ORDER — SUCCINYLCHOLINE CHLORIDE 20 MG/ML IJ SOLN
INTRAMUSCULAR | Status: DC | PRN
  Administered 2014-04-08: 140 mg via INTRAVENOUS

## 2014-04-08 MED ORDER — ARTIFICIAL TEARS OP OINT
TOPICAL_OINTMENT | OPHTHALMIC | Status: DC | PRN
  Administered 2014-04-08: 1 via OPHTHALMIC

## 2014-04-08 MED ORDER — 0.9 % SODIUM CHLORIDE (POUR BTL) OPTIME
TOPICAL | Status: DC | PRN
Start: 2014-04-08 — End: 2014-04-09
  Administered 2014-04-08: 1000 mL

## 2014-04-08 MED ORDER — DEXAMETHASONE SODIUM PHOSPHATE 10 MG/ML IJ SOLN
INTRAMUSCULAR | Status: AC
  Filled 2014-04-08: qty 1

## 2014-04-08 MED ORDER — BIOTENE DRY MOUTH MT LIQD
15.0000 mL | Freq: Two times a day (BID) | OROMUCOSAL | Status: DC
  Administered 2014-04-08 – 2014-04-11 (×6): 15 mL via OROMUCOSAL

## 2014-04-08 MED ORDER — SODIUM CHLORIDE 0.9 % IR SOLN
Status: DC | PRN
  Administered 2014-04-08: 23:00:00

## 2014-04-08 MED ORDER — THROMBIN 20000 UNITS EX KIT
PACK | CUTANEOUS | Status: DC | PRN
  Administered 2014-04-08: 23:00:00 via TOPICAL

## 2014-04-08 MED ORDER — DEXAMETHASONE SODIUM PHOSPHATE 4 MG/ML IJ SOLN
4.0000 mg | Freq: Once | INTRAMUSCULAR | Status: AC
  Administered 2014-04-08: 4 mg via INTRAVENOUS
  Filled 2014-04-08: qty 1

## 2014-04-08 MED ORDER — MIDAZOLAM HCL 2 MG/2ML IJ SOLN
INTRAMUSCULAR | Status: AC
  Filled 2014-04-08: qty 2

## 2014-04-08 MED ORDER — ALBUMIN HUMAN 5 % IV SOLN
INTRAVENOUS | Status: DC | PRN
  Administered 2014-04-08: via INTRAVENOUS

## 2014-04-08 MED ORDER — VECURONIUM BROMIDE 10 MG IV SOLR
INTRAVENOUS | Status: AC
  Filled 2014-04-08: qty 10

## 2014-04-08 MED ORDER — STERILE WATER FOR INJECTION IJ SOLN
INTRAMUSCULAR | Status: AC
  Filled 2014-04-08: qty 20

## 2014-04-08 MED ORDER — ARTIFICIAL TEARS OP OINT
TOPICAL_OINTMENT | OPHTHALMIC | Status: AC
  Filled 2014-04-08: qty 3.5

## 2014-04-08 MED ORDER — EPHEDRINE SULFATE 50 MG/ML IJ SOLN
INTRAMUSCULAR | Status: AC
  Filled 2014-04-08: qty 1

## 2014-04-08 MED ORDER — PROPOFOL 10 MG/ML IV BOLUS
INTRAVENOUS | Status: DC | PRN
  Administered 2014-04-08: 100 mg via INTRAVENOUS

## 2014-04-08 MED ORDER — ROCURONIUM BROMIDE 100 MG/10ML IV SOLN: INTRAVENOUS | Status: DC | PRN

## 2014-04-08 MED ORDER — ROCURONIUM BROMIDE 50 MG/5ML IV SOLN
INTRAVENOUS | Status: AC
  Filled 2014-04-08: qty 1

## 2014-04-08 MED ORDER — FENTANYL CITRATE 0.05 MG/ML IJ SOLN
INTRAMUSCULAR | Status: DC | PRN
  Administered 2014-04-08 (×3): 50 ug via INTRAVENOUS
  Administered 2014-04-08: 100 ug via INTRAVENOUS
  Administered 2014-04-08: 200 ug via INTRAVENOUS
  Administered 2014-04-09: 150 ug via INTRAVENOUS
  Administered 2014-04-09: 100 ug via INTRAVENOUS
  Administered 2014-04-09: 50 ug via INTRAVENOUS

## 2014-04-08 MED ORDER — ROCURONIUM BROMIDE 100 MG/10ML IV SOLN
INTRAVENOUS | Status: DC | PRN
  Administered 2014-04-08: 50 mg via INTRAVENOUS

## 2014-04-08 MED ORDER — VANCOMYCIN HCL IN DEXTROSE 750-5 MG/150ML-% IV SOLN
750.0000 mg | Freq: Two times a day (BID) | INTRAVENOUS | Status: DC
  Administered 2014-04-08 – 2014-04-10 (×4): 750 mg via INTRAVENOUS
  Filled 2014-04-08 (×6): qty 150

## 2014-04-08 SURGICAL SUPPLY — 71 items
BAG DECANTER FOR FLEXI CONT (MISCELLANEOUS) ×3 IMPLANT
BENZOIN TINCTURE PRP APPL 2/3 (GAUZE/BANDAGES/DRESSINGS) ×3 IMPLANT
BLADE 10 SAFETY STRL DISP (BLADE) ×3 IMPLANT
BLADE SURG 11 STRL SS (BLADE) IMPLANT
BLADE SURG ROTATE 9660 (MISCELLANEOUS) IMPLANT
BRUSH SCRUB EZ PLAIN DRY (MISCELLANEOUS) ×3 IMPLANT
BUR CUTTER 7.0 ROUND (BURR) ×3 IMPLANT
BUR MATCHSTICK NEURO 3.0 LAGG (BURR) ×3 IMPLANT
CANISTER SUCT 3000ML (MISCELLANEOUS) ×3 IMPLANT
CLOSURE WOUND 1/2 X4 (GAUZE/BANDAGES/DRESSINGS) ×1
CONT SPEC 4OZ CLIKSEAL STRL BL (MISCELLANEOUS) ×3 IMPLANT
DERMABOND ADVANCED (GAUZE/BANDAGES/DRESSINGS) ×2
DERMABOND ADVANCED .7 DNX12 (GAUZE/BANDAGES/DRESSINGS) ×1 IMPLANT
DRAPE LAPAROTOMY 100X72X124 (DRAPES) ×3 IMPLANT
DRAPE LAPAROTOMY T 102X78X121 (DRAPES) IMPLANT
DRAPE MICROSCOPE LEICA (MISCELLANEOUS) ×3 IMPLANT
DRAPE MICROSCOPE ZEISS OPMI (DRAPES) IMPLANT
DRAPE POUCH INSTRU U-SHP 10X18 (DRAPES) ×3 IMPLANT
DRAPE SURG 17X23 STRL (DRAPES) ×6 IMPLANT
DRSG OPSITE POSTOP 4X10 (GAUZE/BANDAGES/DRESSINGS) ×3 IMPLANT
ELECT REM PT RETURN 9FT ADLT (ELECTROSURGICAL) ×3
ELECTRODE REM PT RTRN 9FT ADLT (ELECTROSURGICAL) ×1 IMPLANT
GAUZE SPONGE 4X4 16PLY XRAY LF (GAUZE/BANDAGES/DRESSINGS) IMPLANT
GLOVE BIO SURGEON STRL SZ 6.5 (GLOVE) ×2 IMPLANT
GLOVE BIO SURGEON STRL SZ7 (GLOVE) ×3 IMPLANT
GLOVE BIO SURGEONS STRL SZ 6.5 (GLOVE) ×1
GLOVE ECLIPSE 9.0 STRL (GLOVE) ×3 IMPLANT
GLOVE EXAM NITRILE LRG STRL (GLOVE) IMPLANT
GLOVE EXAM NITRILE MD LF STRL (GLOVE) IMPLANT
GLOVE EXAM NITRILE XL STR (GLOVE) IMPLANT
GLOVE EXAM NITRILE XS STR PU (GLOVE) IMPLANT
GOWN STRL REUS W/ TWL LRG LVL3 (GOWN DISPOSABLE) ×1 IMPLANT
GOWN STRL REUS W/ TWL XL LVL3 (GOWN DISPOSABLE) ×1 IMPLANT
GOWN STRL REUS W/TWL 2XL LVL3 (GOWN DISPOSABLE) IMPLANT
GOWN STRL REUS W/TWL LRG LVL3 (GOWN DISPOSABLE) ×2
GOWN STRL REUS W/TWL XL LVL3 (GOWN DISPOSABLE) ×2
HEMOSTAT SURGICEL 2X14 (HEMOSTASIS) IMPLANT
KIT BASIN OR (CUSTOM PROCEDURE TRAY) ×3 IMPLANT
KIT ROOM TURNOVER OR (KITS) ×3 IMPLANT
NEEDLE HYPO 22GX1.5 SAFETY (NEEDLE) IMPLANT
NEEDLE HYPO 25X1 1.5 SAFETY (NEEDLE) ×3 IMPLANT
NEEDLE SPNL 20GX3.5 QUINCKE YW (NEEDLE) IMPLANT
NS IRRIG 1000ML POUR BTL (IV SOLUTION) ×3 IMPLANT
PACK LAMINECTOMY NEURO (CUSTOM PROCEDURE TRAY) ×3 IMPLANT
PAD EYE OVAL STERILE LF (GAUZE/BANDAGES/DRESSINGS) IMPLANT
PATTIES SURGICAL .5 X.5 (GAUZE/BANDAGES/DRESSINGS) IMPLANT
PATTIES SURGICAL .5 X3 (DISPOSABLE) IMPLANT
RUBBERBAND STERILE (MISCELLANEOUS) ×6 IMPLANT
SPONGE GAUZE 4X4 12PLY (GAUZE/BANDAGES/DRESSINGS) ×3 IMPLANT
SPONGE LAP 4X18 X RAY DECT (DISPOSABLE) ×6 IMPLANT
SPONGE SURGIFOAM ABS GEL 100 (HEMOSTASIS) IMPLANT
STAPLER VISISTAT 35W (STAPLE) ×3 IMPLANT
STRIP CLOSURE SKIN 1/2X4 (GAUZE/BANDAGES/DRESSINGS) ×2 IMPLANT
SUT BONE WAX W31G (SUTURE) IMPLANT
SUT ETHILON 4 0 PS 2 18 (SUTURE) ×3 IMPLANT
SUT NURALON 4 0 TR CR/8 (SUTURE) IMPLANT
SUT PROLENE 6 0 BV (SUTURE) IMPLANT
SUT VIC AB 0 CT1 18XCR BRD8 (SUTURE) IMPLANT
SUT VIC AB 0 CT1 8-18 (SUTURE)
SUT VIC AB 2-0 CT1 18 (SUTURE) ×6 IMPLANT
SUT VIC AB 3-0 FS2 27 (SUTURE) ×3 IMPLANT
SUT VIC AB 3-0 SH 8-18 (SUTURE) ×3 IMPLANT
SUT VICRYL 4-0 PS2 18IN ABS (SUTURE) IMPLANT
SYR 20ML ECCENTRIC (SYRINGE) IMPLANT
TAPE STRIPS DRAPE STRL (GAUZE/BANDAGES/DRESSINGS) ×3 IMPLANT
TOWEL OR 17X24 6PK STRL BLUE (TOWEL DISPOSABLE) ×3 IMPLANT
TOWEL OR 17X26 10 PK STRL BLUE (TOWEL DISPOSABLE) ×3 IMPLANT
TRAY FOLEY CATH 14FRSI W/METER (CATHETERS) IMPLANT
TUBE CONNECTING 12'X1/4 (SUCTIONS)
TUBE CONNECTING 12X1/4 (SUCTIONS) IMPLANT
WATER STERILE IRR 1000ML POUR (IV SOLUTION) ×3 IMPLANT

## 2014-04-08 NOTE — Progress Notes (Signed)
ANTIBIOTIC CONSULT NOTE - INITIAL  Pharmacy Consult for vancomycin  Indication: bacteremia  Allergies  Allergen Reactions  . Amoxicillin Hives  . Bactrim [Sulfamethoxazole-Tmp Ds] Hives  . Erythromycin Hives  . Keflex [Cephalexin] Hives  . Penicillins Itching and Swelling    Sweating    Patient Measurements: Height: 5\' 2"  (157.5 cm) Weight: 198 lb 13.7 oz (90.2 kg) IBW/kg (Calculated) : 50.1 Adjusted Body Weight:   Vital Signs: Temp: 98.2 F (36.8 C) (05/17 1301) Temp src: Oral (05/17 1301) BP: 116/56 mmHg (05/17 1301) Pulse Rate: 104 (05/17 1146) Intake/Output from previous day: 05/16 0701 - 05/17 0700 In: 218.8 [I.V.:218.8] Out: 3100 [Urine:3100] Intake/Output from this shift: Total I/O In: -  Out: 1700 [Urine:1700]  Labs:  Recent Labs  04/07/14 1202 04/08/14 0439  WBC 13.0* 13.6*  HGB 9.0* 8.8*  PLT 285 318  CREATININE 1.98* 1.19*   Estimated Creatinine Clearance: 54.4 ml/min (by C-G formula based on Cr of 1.19). No results found for this basename: Letta Median, VANCORANDOM, [place], GENTPEAK, GENTRANDOM, TOBRATROUGH, TOBRAPEAK, TOBRARND, AMIKACINPEAK, AMIKACINTROU, AMIKACIN,  in the last 72 hours   Microbiology: Recent Results (from the past 720 hour(s))  URINE CULTURE     Status: None   Collection Time    03/13/14 12:32 PM      Result Value Ref Range Status   Specimen Description URINE, CLEAN CATCH   Final   Special Requests NONE   Final   Culture  Setup Time     Final   Value: 03/14/2014 00:19     Performed at [facility]     Final   Value: >=100,000 COLONIES/ML     Performed at [facility]   Culture     Final   Value: Multiple bacterial morphotypes present, none predominant. Suggest appropriate recollection if clinically indicated.     Performed at [facility]   Report Status 03/15/2014 FINAL   Final  CULTURE, BLOOD (ROUTINE X 2)     Status: None   Collection Time    04/07/14 12:02 PM       Result Value Ref Range Status   Specimen Description RIGHT ANTECUBITAL   Final   Special Requests BOTTLES DRAWN AEROBIC ONLY 6CC   Final   Culture     Final   Value: GRAM POSITIVE COCCI IN CLUSTERS     Gram Stain Report Called to,Read Back By and Verified With: L.SCHONEWITZ AT 1121 ON 04/08/14 BY S.VANHOORNE   Report Status PENDING   Incomplete  CULTURE, BLOOD (ROUTINE X 2)     Status: None   Collection Time    04/07/14 12:02 PM      Result Value Ref Range Status   Specimen Description BLOOD RIGHT HAND   Final   Special Requests BOTTLES DRAWN AEROBIC AND ANAEROBIC 8CC   Final   Culture     Final   Value: GRAM POSITIVE COCCI IN PAIRS     Gram Stain Report Called to,Read Back By and Verified With: STONE A. AT 0932T ON [id number] BY THOMPSON S.   Report Status PENDING   Incomplete    Medical History: Past Medical History  Diagnosis Date  . Asthma   . COPD (chronic obstructive pulmonary disease)   . Hypothyroidism   . Essential hypertension, benign   . Hyperlipidemia   . Type 2 diabetes mellitus   . Atrial fibrillation   . MI (myocardial infarction)     2012  . Pericardial effusion 09/09/2013  .  CHF (congestive heart failure)   . On home O2     4L N/C continuous  . Thoracic compression fracture 03/23/2014  . Ventral hernia     Medications:  Anti-infectives   Start     Dose/Rate Route Frequency Ordered Stop   04/08/14 1630  vancomycin (VANCOCIN) IVPB 750 mg/150 ml premix     750 mg 150 mL/hr over 60 Minutes Intravenous Every 12 hours 04/08/14 1538     04/08/14 1000  levofloxacin (LEVAQUIN) tablet 500 mg     500 mg Oral Daily 04/07/14 1636     04/07/14 1615  levofloxacin (LEVAQUIN) tablet 500 mg  Status:  Discontinued     500 mg Oral Daily 04/07/14 1611 04/07/14 1635     Assessment: 3 yof initally presented with AMS and weakness. Now with positive blood cultures. To start vancomycin. Pt is also on levaquin. Pt is afebrile, WBC 13.6 and Scr improved to 1.19.   Vanc  5/17>> Levaquin 5/17>>  5/16 Blood - GPC 2/2  Goal of Therapy:  Vancomycin trough level 15-20 mcg/ml  Plan:  1. Vancomycin 750mg  IV Q12H 2. F/u renal fxn, C&S, clinical status and trough at [place] 04/08/2014,3:39 PM

## 2014-04-08 NOTE — Consult Note (Signed)
Reason for Consult: New onset paraplegia Referring Physician: Dr Sarajane Jews  CC: Paraplegia  HPI: Joanna Perez is a 58 y.o. female with multiple medical problems who was transferred to [facility] today from [facility] for evaluation of paraplegia. Most of the history was obtained from the patient's husband as the patient was extremely lethargic and at times appeared confused. The patient had recently been admitted to [facility] hospital for a COPD exacerbation from 03/23/2014 until 03/28/2014.   Yesterday the patient was having breakfast in the kitchen. Her husband came out to join her and found her to be somewhat unresponsive. He attempted to get her back to bed but was unable to lift her. He called EMS and when they tried to place the patient on a stretcher she slid to the floor. According to the patient's husband EMS was initially unable to find a pulse or a blood pressure.  The patient was brought to the emergency department at [facility] , stabilized, and admitted.  In the emergency department the patient's husband stated that he was concerned that the patient might be abusing her prescription medications.   Today when the patient awoke she stated that she was unable to feel or move her legs. She was transferred to [facility] for further evaluation. The patient's husband reported that the patient was unable to void yesterday and had a urinary catheter placed at [facility]. He also notes that the patient has been intermittently confused for approximately 3 months. He states that she stays tired all of the time. She was evaluated in the past for obstructive sleep apnea but told she did not have this diagnosis.  Past Medical History  Diagnosis Date  . Asthma   . COPD (chronic obstructive pulmonary disease)   . Hypothyroidism   . Essential hypertension, benign   . Hyperlipidemia   . Type 2 diabetes mellitus   . Atrial fibrillation   . MI (myocardial infarction)     2012  . Pericardial effusion 09/09/2013  . CHF (congestive heart  failure)   . On home O2     4L N/C continuous  . Thoracic compression fracture 03/23/2014  . Ventral hernia     Past Surgical History  Procedure Laterality Date  . Hernia repair    . Abdominal hysterectomy    . Stomach surgery      Wound vac currently in place  . Esophagogastroduodenoscopy  11/11/2011    Procedure: ESOPHAGOGASTRODUODENOSCOPY (EGD);  Surgeon: Rogene Houston, MD;  Location: [facility];  Service: Endoscopy;  Laterality: N/A;  . Foreign body removal  11/11/2011    Procedure: FOREIGN BODY REMOVAL;  Surgeon: Rogene Houston, MD;  Location: [facility];  Service: Endoscopy;  Laterality: N/A;  . Tracheal surgery    . Abdominal surgery      Family History  Problem Relation Age of Onset  . Diabetes Mother     Social History:  reports that she quit smoking about 4 months ago. Her smoking use included Cigarettes. She has a 52.5 pack-year smoking history. She has never used smokeless tobacco. She reports that she drinks alcohol. She reports that she does not use illicit drugs.  Allergies  Allergen Reactions  . Amoxicillin Hives  . Bactrim [Sulfamethoxazole-Tmp Ds] Hives  . Erythromycin Hives  . Keflex [Cephalexin] Hives  . Penicillins Itching and Swelling    Sweating    Medications:  Scheduled: . ALPRAZolam  1 mg Oral TID  . antiseptic oral rinse  15 mL Mouth Rinse BID  .  atorvastatin  20 mg Oral q1800  . budesonide  0.25 mg Nebulization BID  . cholecalciferol  400 Units Oral Daily  . citalopram  20 mg Oral Daily  . fluticasone  1 spray Each Nare Daily  . gabapentin  100 mg Oral TID  . guaiFENesin  600 mg Oral Daily  . hydrocortisone sodium succinate  50 mg Intravenous Q6H  . insulin aspart  0-15 Units Subcutaneous TID WC  . insulin aspart  0-5 Units Subcutaneous QHS  . insulin glargine  10 Units Subcutaneous BID  . levofloxacin  500 mg Oral Daily  . levothyroxine  112 mcg Oral QAC breakfast  . nystatin  5 mL Oral QID  . pantoprazole  40 mg Oral Daily   . tiotropium  18 mcg Inhalation Daily    ROS: The patient is confused and is unable to give a review of systems. Her husband reports nothing unusual except as noted above.    Physical Examination: Blood pressure 116/56, pulse 104, temperature 98.2 F (36.8 C), temperature source Oral, resp. rate 18, height 5\' 2"  (1.575 m), weight 198 lb 13.7 oz (90.2 kg), SpO2 96.00%.  Neurologic Examination  Mental Status: The patient is very lethargic. She can be aroused but quickly falls back to sleep.  She follows some commands. She is oriented to person place and 2015. At other times she appears confused and answers questions inappropriately. She is poorly cooperative with the exam. Cranial Nerves: II: Discs not visualized; Visual fields grossly normal, pupils equal, round, reactive to light and accommodation III,IV, VI: ptosis not present, extra-ocular motions intact bilaterally V,VII: smile symmetric, facial light touch sensation normal bilaterally VIII: hearing normal bilaterally IX,X: gag reflex present XI: bilateral shoulder shrug XII: midline tongue extension Motor: Right : Upper extremity   5/5    Left:     Upper extremity   5/5  Lower extremity   0/5     Lower extremity   0/5 Tone and bulk:normal tone throughout; no atrophy noted Sensory: Sensation to light touch decreased but not absent in both lower extremities. She also reports decreased sensation to light touch in the right upper extremity. Deep Tendon Reflexes: 2+ and symmetric throughout Plantars: Right: downgoing   Left: downgoing Cerebellar: Finger to nose revealed bilateral dysmetria. Gait: The patient is unable to ambulate    Laboratory Studies:   Basic Metabolic Panel:  Recent Labs Lab 04/07/14 1202 04/08/14 0439  NA 140 142  K 4.0 4.0  CL 95* 100  CO2 30 30  GLUCOSE 185* 102*  BUN 49* 37*  CREATININE 1.98* 1.19*  CALCIUM 8.2* 7.9*    Liver Function Tests:  Recent Labs Lab 04/07/14 1202  04/08/14 0439  AST 68* 44*  ALT 43* 34  ALKPHOS 52 48  BILITOT 0.2* 0.2*  PROT 5.7* 5.4*  ALBUMIN 1.9* 1.8*   No results found for this basename: LIPASE, AMYLASE,  in the last 168 hours No results found for this basename: AMMONIA,  in the last 168 hours  CBC:  Recent Labs Lab 04/07/14 1202 04/08/14 0439  WBC 13.0* 13.6*  NEUTROABS 12.0*  --   HGB 9.0* 8.8*  HCT 29.6* 29.1*  MCV 82.9 82.4  PLT 285 318    Cardiac Enzymes: No results found for this basename: CKTOTAL, CKMB, CKMBINDEX, TROPONINI,  in the last 168 hours  BNP: No components found with this basename: POCBNP,   CBG:  Recent Labs Lab 04/07/14 1734 04/07/14 2041 04/08/14 0744 04/08/14 1109  GLUCAP 118* 147*  53* 97*    Microbiology: Results for orders placed during the hospital encounter of 04/07/14  CULTURE, BLOOD (ROUTINE X 2)     Status: None   Collection Time    04/07/14 12:02 PM      Result Value Ref Range Status   Specimen Description RIGHT ANTECUBITAL   Final   Special Requests BOTTLES DRAWN AEROBIC ONLY 6CC   Final   Culture     Final   Value: GRAM POSITIVE COCCI IN CLUSTERS     Gram Stain Report Called to,Read Back By and Verified With: L.SCHONEWITZ AT 1121 ON 04/08/14 BY S.VANHOORNE   Report Status PENDING   Incomplete  CULTURE, BLOOD (ROUTINE X 2)     Status: None   Collection Time    04/07/14 12:02 PM      Result Value Ref Range Status   Specimen Description BLOOD RIGHT HAND   Final   Special Requests BOTTLES DRAWN AEROBIC AND ANAEROBIC 8CC   Final   Culture     Final   Value: GRAM POSITIVE COCCI IN PAIRS     Gram Stain Report Called to,Read Back By and Verified With: STONE A. AT [id number] ON [id number] BY THOMPSON S.   Report Status PENDING   Incomplete    Coagulation Studies: No results found for this basename: LABPROT, INR,  in the last 72 hours  Urinalysis:  Recent Labs Lab 04/07/14 1150  COLORURINE YELLOW  LABSPEC 1.025  PHURINE 5.5  GLUCOSEU NEGATIVE  HGBUR NEGATIVE   BILIRUBINUR NEGATIVE  KETONESUR NEGATIVE  PROTEINUR NEGATIVE  UROBILINOGEN 0.2  NITRITE NEGATIVE  LEUKOCYTESUR NEGATIVE    Lipid Panel:  No results found for this basename: chol, trig, hdl, cholhdl, vldl, ldlcalc    HgbA1C:  Lab Results  Component Value Date   HGBA1C 7.2* 03/23/2014    Urine Drug Screen:   No results found for this basename: labopia, cocainscrnur, labbenz, amphetmu, thcu, labbarb    Alcohol Level: No results found for this basename: ETH,  in the last 168 hours  Other results: EKG: ST rate 104 BPM  Imaging:  Ct Abdomen Pelvis Wo Contrast 04/07/2014    1. There is a large lower abdominal and pelvic ventral wall hernia containing loops of normal calibered small and large bowel as well as normal appearing mesenteric fat. No inflammatory changes are demonstrated within the hernia proper. There is a small amount of increased density in the subcutaneous fat over the lower aspect of the hernia on images 73 through 80. This may reflect inflammatory change of the skin and subcutaneous tissues  2. There is mild distention of the gallbladder without evidence of gallstones or surrounding inflammatory change. No other acute hepatobiliary abnormality is demonstrated.  3. There is no evidence of a small or large bowel obstruction nor evidence of colitis or diverticulitis.  4. There is a small right pleural effusion and trace left pleural effusion. There are emphysematous changes at both lung bases. 5. There is a known wedge compression of the body of T12.    Ct Head Wo Contrast 04/07/2014    No acute intracranial findings.  Mild pansinusitis.     Dg Lumbar Spine 1 View 04/08/2014    Unable to exclude lumbar spine fracture given patient rotation and single AP view.    Dg Thoracic Spine 1 View 04/08/2014    Nondiagnostic examination for evaluation of thoracic spine fracture.    Dg Chest Port 1 View 04/07/2014    No edema or definitive pneumonia.  Asymmetric density at  the right apex is favored technical, but if no resolution of symptoms recommend follow-up chest x-ray.     Assessment/Plan:   58 year old female with altered mental status and new onset paraplegia of unknown etiology with MRI study showing acute cord compression at T6 with thoracic stenosis as well as abnormal cord signal.  Recommendations: 1. Agree with obtaining urgent neurosurgical consultation. 2. Decadron 10 mg initially, then 4 mg IV every 6 hours 3. Physical therapy consultation  We will continue follow this patient with you.   Mikey Bussing PA-C [facility] Pager [phone]  I personally participated in this patient's evaluation and management, including formulating above clinical impression and management recommendations.  04/08/2014, 2:58 PM

## 2014-04-08 NOTE — Anesthesia Preprocedure Evaluation (Addendum)
Anesthesia Evaluation  Patient identified by MRN, date of birth, ID bandGeneral Assessment Comment:Patient sleepy  Reviewed: Allergy & Precautions, H&P , NPO status , Patient's Chart, lab work & pertinent test results  History of Anesthesia Complications Negative for: history of anesthetic complications  Airway Mallampati: II TM Distance: >3 FB Neck ROM: Full    Dental  (+) Edentulous Upper, Edentulous Lower   Pulmonary shortness of breath and Long-Term Oxygen Therapy, COPD (4l NCO2) COPD inhaler and oxygen dependent, former smoker,  breath sounds clear to auscultation        Cardiovascular hypertension, Pt. 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    GI/Hepatic GERD-  Medicated and Controlled,Elevated LFT's transiently with recent hypotensive episode   Endo/Other  diabetes (glu 117), Oral Hypoglycemic AgentsHypothyroidism Morbid obesity  Renal/GU Renal InsufficiencyRenal disease (creat 1.19)     Musculoskeletal   Abdominal (+) + obese,   Peds  Hematology  (+) Blood dyscrasia (Hb 8.8, eliquis), anemia ,   Anesthesia Other Findings   Reproductive/Obstetrics                         Anesthesia Physical Anesthesia Plan  ASA: IV and emergent  Anesthesia Plan: General   Post-op Pain Management:    Induction: Intravenous  Airway Management Planned: Oral ETT  Additional Equipment: Arterial line  Intra-op Plan:   Post-operative Plan: Post-operative intubation/ventilation  Informed Consent: I have reviewed the patients History and Physical, chart, labs and discussed the procedure including the risks, benefits and alternatives for the proposed anesthesia with the patient or  authorized representative who has indicated his/her understanding and acceptance.   Only emergency history available and History available from chart only  Plan Discussed with: CRNA and Surgeon  Anesthesia Plan Comments: (Plan routine monitors, A line, GETA with post op vnetilation)       Anesthesia Quick Evaluation

## 2014-04-08 NOTE — Progress Notes (Signed)
PROGRESS NOTE  LUCELY LEARD FTD:[id number] DOB: 04-10-1956 DOA: 04/07/2014 PCP: Glo Herring., MD Pulmonologist: Dr. Luan Pulling.  Summary: 58 year old woman with complicated past medical history . Presented to the emergency department 5/16 with history of decreased responsiveness, syncopal spell at home, and profound hypotension of unclear etiology. Treated with stress dose steroids and IV fluids with resolution of hypotension and improvement of renal failure. The morning of 5/17, husband reported lower extremity weakness. Patient had acute paraplegia and plans made for transfer to [facility], neurology consultation, stat MRI.  Assessment/Plan: 1. Acute bilateral lower extremity paraplegia. See discussion below. 2. Profound hypotension of unclear etiology. Consider adrenal insufficiency, seems to have improved with initiation of stress dose steroids. Currently normotensive. 3. Acute renal failure, dehydration. Resolving rapidly with IV fluids. Etiology unclear, possibly related to hypotension. 4. 1/2 gram-positive bacteremia. Possible contaminant, followup cultures. Await MRI, if no evidence of abscess or infection, consider initiation of antibiotics until culture resulted.  5. Normocytic anemia. Acute on chronic. No evidence of bleeding at this point. Anticoagulation held. Follow clinically. Currently appears stable. 6. Abdominal pain. Seems to be resolved. Exam benign. CT revealed known hernia. 7. Acute encephalopathy, resolved. Etiology unclear. 8. Transaminitis, improving. Likely related to hypotension, hypoperfusion.  9. Syncopal spell at home. Husband denies, reports no trauma last 24 hours.  10. Facial droop, left-sided weakness? This was reported by ER RN in chart, but no documented elsewhere and patient is no evidence of this on exam. 11. Chronic diastolic heart failure. Appears well compensated. 12. Atrial fibrillation maintained on Cardizem, on Eliquis as an outpatient. 13. COPD,  chronic hypoxic respiratory failure maintained on 4 L per minute nasal cannula at home. Appears stable at this time. 14. Diabetes mellitus type 2. Stable. 15. Chronic back pain on narcotics.   Discussed below in detail with patient and husband, agreeable to transfer to [facility].  Patient with acute paraplegia lower extremities. Last seen normal yesterday evening. No history of trauma in the last 24 hours, but did "sit down hard on the commode" 5/14. Plain films of the thoracic and lumbar spine nondiagnostic. Given profound hypotension for greater than 12 hours yesterday, concern for anterior spinal infarct (intact sensation), differential includes epidural hematoma (on Eliquis as an outpatient), abcess or transverse myelitis. Case discussed in detail with Dr. Vertell Limber neurosurgery--recommended MRI, neurology consult but doubts surgery issue. If MRI reveals structural lesion, his partner Dr. Annette Stable should be notified. Discussed with neurology Dr. Leonel Ramsay. Will see in consult. Per our discussion he advised that given last known normal, patient is not a candidate for tPA or intervention at this point and no ASA should be given as epidural hematoma is in differential.  Transfer to tele at [facility] Dr. Venetia Constable, stat MRI cervical, thoracic, lumbar with/without contrast per neurology if meets criteria based on kidney function/h/o DM. Otherwise, without contrast (discussed with Dr. Nyoka Cowden radiology). Discussed with flow manager and Dr. Venetia Constable.  Eliquis remains on hold. Subcutaneous heparin discontinued.  Remain NPO, continue IVF, monitor volume status with h/o hypotension, but also h/o diastolic dysfunction.  Code Status: full code DVT prophylaxis: SCDs  Murray Hodgkins, MD  [facility]  Pager [phone] If 7PM-7AM, please contact night-coverage at [email or web address], password [facility] 04/08/2014, 7:43 AM  LOS: 1 day   Consultants:    Procedures:    Antibiotics:    HPI/Subjective: Hypotension corrected  overnight. No other issues per RN.  Patient reports chronic low back pain. She requests pain medication. She reports her breathing is at baseline. Denies  pain elsewhere.  Husband at bedside reports "she cannot move her legs". He reports the patient complained of some radicular pain from her low back this morning. He reports she was able to move her legs yesterday. Although the patient reports syncope and fall yesterday, husband reports no syncope, no fall. No trauma since 5/21 when "she sat down hard on the commode".  Objective: Filed Vitals:   04/08/14 0330 04/08/14 0345 04/08/14 0400 04/08/14 0500  BP: 124/53 130/58 124/53 128/58  Pulse:      Temp:   99 F (37.2 C)   TempSrc:   Oral   Resp: 14 14 13 13   Height:      Weight:    90.2 kg (198 lb 13.7 oz)  SpO2:        Intake/Output Summary (Last 24 hours) at 04/08/14 0743 Last data filed at 04/08/14 0500  Gross per 24 hour  Intake 218.75 ml  Output   3100 ml  Net -2881.25 ml     Filed Weights   04/07/14 1622 04/08/14 0500  Weight: 87.9 kg (193 lb 12.6 oz) 90.2 kg (198 lb 13.7 oz)    Exam:   Afebrile, vital signs are stable. Stable hypoxia on 4 L.  Gen. Appears calm, chronically ill but nontoxic.  Psychiatric. Grossly normal mood and affect. Speech fluent and appropriate.  Neurologic. Cranial nerves 2-12 intact. No upper extremity dysdiadochokinesis. No pronator drift.  Musculoskeletal. Upper chimney strength 5/5, symmetric. Grossly normal range of movement. Able to move the trunk. Flaccid paralysis bilateral lower extremities. 2+ patellar reflex. Equivocal Babinski. Does not withdrawal to pain. Reports normal sensation. Correctly identifies when examiner is touching either extremity.  Eyes. Grossly unremarkable  ENT grossly unremarkable.  Cardiovascular regular rate and rhythm. No murmur, rub gallop. No lower extremity edema. Telemetry sinus rhythm.  Respiratory. Clear to auscultation bilaterally. No wheezes, rales  or rhonchi. Normal respiratory effort.  Abdomen soft, nontender, nondistended. Hernia noted.  Data Reviewed:  I/O inaccurate, boluses not listed  Creatinine has nearly normalized, 1.19. BUN trending downwards.  LFTs improving. WBC without significant change, 13.6. Patient on steroids.  Hemoglobin stable this morning, and 20 to  Plain films of the thoracic and lumbar spine nondiagnostic.  Scheduled Meds: . ALPRAZolam  1 mg Oral TID  . antiseptic oral rinse  15 mL Mouth Rinse BID  . atorvastatin  20 mg Oral q1800  . budesonide  0.25 mg Nebulization BID  . cholecalciferol  400 Units Oral Daily  . citalopram  20 mg Oral Daily  . diltiazem  240 mg Oral Daily  . fluticasone  1 spray Each Nare Daily  . gabapentin  100 mg Oral TID  . guaiFENesin  600 mg Oral Daily  . heparin subcutaneous  5,000 Units Subcutaneous 3 times per day  . hydrocortisone sodium succinate  50 mg Intravenous Q6H  . insulin aspart  0-15 Units Subcutaneous TID WC  . insulin aspart  0-5 Units Subcutaneous QHS  . insulin glargine  10 Units Subcutaneous BID  . levofloxacin  500 mg Oral Daily  . levothyroxine  112 mcg Oral QAC breakfast  . nystatin  5 mL Oral QID  . pantoprazole  40 mg Oral Daily  . tiotropium  18 mcg Inhalation Daily   Continuous Infusions: . sodium chloride 125 mL/hr at 04/08/14 0505    Principal Problem:   Acute paraplegia Active Problems:   HYPOTHYROIDISM   Type 2 diabetes mellitus   BACK PAIN, CHRONIC   Chronic respiratory failure  Chronic diastolic heart failure   Hypotension   Obesity   ARF (acute renal failure)   Dehydration   Acute encephalopathy   Bacteremia   Normocytic anemia   Time spent 60 minutesGreater than 50% counseling and coordination of care

## 2014-04-08 NOTE — Consult Note (Signed)
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 female.  HPI: Patient is a 58 year old female with multiple medical problems including severe chronic obstructive pulmonary disease on home oxygen, congestive heart failure, diabetes mellitus, atrial fibrillation, coronary artery disease status post previous MI, and chronic hypertension who presents on transfer from [facility] for evaluation and treatment of bilateral lower extremity paraplegia. Patient had an episode yesterday where she became obtunded and was taken to the emergency department for evaluation. She was found to be profoundly hypotensive at this point. She was resuscitated but upon further evaluation was found to be unable to move either lower extremity. She has diminished but still present sensation in both lower extremities. She has lost bowel and bladder control. She has no history of trauma aside from a previous possible fall to a sitting position a few days ago. She does have a history of a previous lower thoracic compression fracture. She has been fluid resuscitated and her hypertension has resolved. She had some element of acute renal failure but this is mostly improved as well. Her mental status remains diminished but she is able to awaken and answer some simple questions. She does complain of upper thoracic pain. She denies any upper extremity symptoms.  MRI scan was obtained upon transfer to [facility] hospital. This demonstrates an apparent fracture at T6 with marked spinal stenosis and spinal cord compression. This is complicated by a dorsal epidural process as well possibly consistent with a small epidural hematoma. I do not necessarily see any evidence of neoplastic disease but this is certainly a possibility I do not see any evidence of definite infectious pathology. This seems most consistent with a compression fracture with significant settling and stenosis  complicated by dorsal ligamentous buckling and the aforementioned epidural hematoma. There spinal cord signal abnormality at this level.     Past Medical History  Diagnosis Date  . Asthma   . COPD (chronic obstructive pulmonary disease)   . Hypothyroidism   . Essential hypertension, benign   . Hyperlipidemia   . Type 2 diabetes mellitus   . Atrial fibrillation   . MI (myocardial infarction)     2012  . Pericardial effusion 09/09/2013  . CHF (congestive heart failure)   . On home O2     4L N/C continuous  . Thoracic compression fracture 03/23/2014  . Ventral hernia     Past Surgical History  Procedure Laterality Date  . Hernia repair    . Abdominal hysterectomy    . Stomach surgery      Wound vac currently in place  . Esophagogastroduodenoscopy  11/11/2011    Procedure: ESOPHAGOGASTRODUODENOSCOPY (EGD);  Surgeon: Rogene Houston, MD;  Location: [facility];  Service: Endoscopy;  Laterality: N/A;  . Foreign body removal  11/11/2011    Procedure: FOREIGN BODY REMOVAL;  Surgeon: Rogene Houston, MD;  Location: [facility];  Service: Endoscopy;  Laterality: N/A;  . Tracheal surgery    . Abdominal surgery      Family History  Problem Relation Age of Onset  . Diabetes Mother     Social History:  reports that she quit smoking about 4 months ago. Her smoking use included Cigarettes. She has a 52.5 pack-year smoking history. She has never used smokeless tobacco. She reports that she drinks alcohol. She reports that she does not use illicit drugs.  Allergies:  Allergies  Allergen Reactions  . Amoxicillin Hives  .  Bactrim [Sulfamethoxazole-Tmp Ds] Hives  . Erythromycin Hives  . Keflex [Cephalexin] Hives  . Penicillins Itching and Swelling    Sweating    Medications: I have reviewed the patient's current medications.  Results for orders placed during the hospital encounter of 04/07/14 (from the past 48 hour(s))  URINALYSIS, ROUTINE W REFLEX MICROSCOPIC     Status: None    Collection Time    04/07/14 11:50 AM      Result Value Ref Range   Color, Urine YELLOW  YELLOW   APPearance CLEAR  CLEAR   Specific Gravity, Urine 1.025  1.005 - 1.030   pH 5.5  5.0 - 8.0   Glucose, UA NEGATIVE  NEGATIVE mg/dL   Hgb urine dipstick NEGATIVE  NEGATIVE   Bilirubin Urine NEGATIVE  NEGATIVE   Ketones, ur NEGATIVE  NEGATIVE mg/dL   Protein, ur NEGATIVE  NEGATIVE mg/dL   Urobilinogen, UA 0.2  0.0 - 1.0 mg/dL   Nitrite NEGATIVE  NEGATIVE   Leukocytes, UA NEGATIVE  NEGATIVE   Comment: MICROSCOPIC NOT DONE ON URINES WITH NEGATIVE PROTEIN, BLOOD, LEUKOCYTES, NITRITE, OR GLUCOSE <1000 mg/dL.  LACTIC ACID, PLASMA     Status: None   Collection Time    04/07/14 12:00 PM      Result Value Ref Range   Lactic Acid, Venous 1.7  0.5 - 2.2 mmol/L  CBC WITH DIFFERENTIAL     Status: Abnormal   Collection Time    04/07/14 12:02 PM      Result Value Ref Range   WBC 13.0 (*) 4.0 - 10.5 K/uL   RBC 3.57 (*) 3.87 - 5.11 MIL/uL   Hemoglobin 9.0 (*) 12.0 - 15.0 g/dL   HCT 29.6 (*) 36.0 - 46.0 %   MCV 82.9  78.0 - 100.0 fL   MCH 25.2 (*) 26.0 - 34.0 pg   MCHC 30.4  30.0 - 36.0 g/dL   RDW 19.9 (*) 11.5 - 15.5 %   Platelets 285  150 - 400 K/uL   Neutrophils Relative % 93 (*) 43 - 77 %   Neutro Abs 12.0 (*) 1.7 - 7.7 K/uL   Lymphocytes Relative 3 (*) 12 - 46 %   Lymphs Abs 0.4 (*) 0.7 - 4.0 K/uL   Monocytes Relative 4  3 - 12 %   Monocytes Absolute 0.6  0.1 - 1.0 K/uL   Eosinophils Relative 0  0 - 5 %   Eosinophils Absolute 0.0  0.0 - 0.7 K/uL   Basophils Relative 0  0 - 1 %   Basophils Absolute 0.0  0.0 - 0.1 K/uL  COMPREHENSIVE METABOLIC PANEL     Status: Abnormal   Collection Time    04/07/14 12:02 PM      Result Value Ref Range   Sodium 140  137 - 147 mEq/L   Potassium 4.0  3.7 - 5.3 mEq/L   Chloride 95 (*) 96 - 112 mEq/L   CO2 30  19 - 32 mEq/L   Glucose, Bld 185 (*) 70 - 99 mg/dL   BUN 49 (*) 6 - 23 mg/dL   Creatinine, Ser 1.98 (*) 0.50 - 1.10 mg/dL   Calcium 8.2 (*) 8.4  - 10.5 mg/dL   Total Protein 5.7 (*) 6.0 - 8.3 g/dL   Albumin 1.9 (*) 3.5 - 5.2 g/dL   AST 68 (*) 0 - 37 U/L   ALT 43 (*) 0 - 35 U/L   Alkaline Phosphatase 52  39 - 117 U/L   Total Bilirubin 0.2 (*)  0.3 - 1.2 mg/dL   GFR calc non Af Amer 27 (*) >90 mL/min   GFR calc Af Amer 31 (*) >90 mL/min   Comment: (NOTE)     The eGFR has been calculated using the CKD EPI equation.     This calculation has not been validated in all clinical situations.     eGFR's persistently <90 mL/min signify possible Chronic Kidney     Disease.  CULTURE, BLOOD (ROUTINE X 2)     Status: None   Collection Time    04/07/14 12:02 PM      Result Value Ref Range   Specimen Description RIGHT ANTECUBITAL     Special Requests BOTTLES DRAWN AEROBIC ONLY 6CC     Culture       Value: GRAM POSITIVE COCCI IN CLUSTERS     Gram Stain Report Called to,Read Back By and Verified With: L.SCHONEWITZ AT 1121 ON 04/08/14 BY S.VANHOORNE   Report Status PENDING    CULTURE, BLOOD (ROUTINE X 2)     Status: None   Collection Time    04/07/14 12:02 PM      Result Value Ref Range   Specimen Description BLOOD RIGHT HAND     Special Requests BOTTLES DRAWN AEROBIC AND ANAEROBIC 8CC     Culture       Value: GRAM POSITIVE COCCI IN PAIRS     Gram Stain Report Called to,Read Back By and Verified With: STONE A. AT [id number] ON [id number] BY THOMPSON S.   Report Status PENDING    POC OCCULT BLOOD, ED     Status: None   Collection Time    04/07/14  2:19 PM      Result Value Ref Range   Fecal Occult Bld NEGATIVE  NEGATIVE  PROCALCITONIN     Status: None   Collection Time    04/07/14  2:50 PM      Result Value Ref Range   Procalcitonin 1.51     Comment:            Interpretation:     PCT > 0.5 ng/mL and <= 2 ng/mL:     Systemic infection (sepsis) is possible,     but other conditions are known to elevate     PCT as well.     (NOTE)             ICU PCT Algorithm               Non ICU PCT Algorithm        ----------------------------      ------------------------------             PCT < 0.25 ng/mL                 PCT < 0.1 ng/mL         Stopping of antibiotics            Stopping of antibiotics           strongly encouraged.               strongly encouraged.        ----------------------------     ------------------------------           PCT level decrease by               PCT < 0.25 ng/mL           >= 80% from peak PCT  OR PCT 0.25 - 0.5 ng/mL          Stopping of antibiotics                                                 encouraged.         Stopping of antibiotics               encouraged.        ----------------------------     ------------------------------           PCT level decrease by              PCT >= 0.25 ng/mL           < 80% from peak PCT            AND PCT >= 0.5 ng/mL            Continuing antibiotics                                                  encouraged.           Continuing antibiotics                encouraged.        ----------------------------     ------------------------------         PCT level increase compared          PCT > 0.5 ng/mL             with peak PCT AND              PCT >= 0.5 ng/mL             Escalation of antibiotics                                              strongly encouraged.          Escalation of antibiotics            strongly encouraged.  LACTIC ACID, PLASMA     Status: None   Collection Time    04/07/14  2:59 PM      Result Value Ref Range   Lactic Acid, Venous 0.9  0.5 - 2.2 mmol/L  GLUCOSE, CAPILLARY     Status: Abnormal   Collection Time    04/07/14  5:34 PM      Result Value Ref Range   Glucose-Capillary 118 (*) 70 - 99 mg/dL  GLUCOSE, CAPILLARY     Status: Abnormal   Collection Time    04/07/14  8:41 PM      Result Value Ref Range   Glucose-Capillary 147 (*) 70 - 99 mg/dL   Comment 1 Notify RN    COMPREHENSIVE METABOLIC PANEL     Status: Abnormal   Collection Time    04/08/14  4:39 AM      Result Value Ref Range   Sodium 142  137 - 147  mEq/L   Potassium 4.0  3.7 - 5.3 mEq/L   Chloride 100  96 - 112 mEq/L  CO2 30  19 - 32 mEq/L   Glucose, Bld 102 (*) 70 - 99 mg/dL   BUN 37 (*) 6 - 23 mg/dL   Creatinine, Ser 1.19 (*) 0.50 - 1.10 mg/dL   Comment: DELTA CHECK NOTED   Calcium 7.9 (*) 8.4 - 10.5 mg/dL   Total Protein 5.4 (*) 6.0 - 8.3 g/dL   Albumin 1.8 (*) 3.5 - 5.2 g/dL   AST 44 (*) 0 - 37 U/L   ALT 34  0 - 35 U/L   Alkaline Phosphatase 48  39 - 117 U/L   Total Bilirubin 0.2 (*) 0.3 - 1.2 mg/dL   GFR calc non Af Amer 50 (*) >90 mL/min   GFR calc Af Amer 58 (*) >90 mL/min   Comment: (NOTE)     The eGFR has been calculated using the CKD EPI equation.     This calculation has not been validated in all clinical situations.     eGFR's persistently <90 mL/min signify possible Chronic Kidney     Disease.  CBC     Status: Abnormal   Collection Time    04/08/14  4:39 AM      Result Value Ref Range   WBC 13.6 (*) 4.0 - 10.5 K/uL   RBC 3.53 (*) 3.87 - 5.11 MIL/uL   Hemoglobin 8.8 (*) 12.0 - 15.0 g/dL   HCT 29.1 (*) 36.0 - 46.0 %   MCV 82.4  78.0 - 100.0 fL   MCH 24.9 (*) 26.0 - 34.0 pg   MCHC 30.2  30.0 - 36.0 g/dL   RDW 19.8 (*) 11.5 - 15.5 %   Platelets 318  150 - 400 K/uL  GLUCOSE, CAPILLARY     Status: Abnormal   Collection Time    04/08/14  7:44 AM      Result Value Ref Range   Glucose-Capillary 124 (*) 70 - 99 mg/dL  GLUCOSE, CAPILLARY     Status: Abnormal   Collection Time    04/08/14 11:09 AM      Result Value Ref Range   Glucose-Capillary 123 (*) 70 - 99 mg/dL  GLUCOSE, CAPILLARY     Status: Abnormal   Collection Time    04/08/14  5:46 PM      Result Value Ref Range   Glucose-Capillary 117 (*) 70 - 99 mg/dL   Comment 1 Documented in Chart     Comment 2 Notify RN      Ct Abdomen Pelvis Wo Contrast  04/07/2014   CLINICAL DATA:  Generalized pain and anemia with altered level of consciousness.  EXAM: CT ABDOMEN AND PELVIS WITHOUT CONTRAST  TECHNIQUE: Multidetector CT imaging of the abdomen and pelvis  was performed following the standard protocol without IV contrast. The patient did receive oral contrast material.  COMPARISON:  DG LUMBAR SPINE 2-3 VIEWS dated 02/22/2014; DG ABDOMEN 1V dated 10/03/2013; CT ABD/PELVIS W CM dated 09/02/2010  FINDINGS: Again demonstrated is a broadly necked lower abdominal and pelvic ventral hernia. This contains loops of normal calibered small and large bowel. No inflammatory changes are demonstrated. Since the previous time there has developed an additional hernia pocket along the anterior inferior aspect of the primary hernia. It contains loops of normal calibered small bowel. This is demonstrated best on images 62-70. The stomach is partially distended with gas and contrast and is grossly normal. The jejunum and ileum exhibit no evidence of obstruction. The colon contains a moderate volume of stool without evidence of obstruction or colitis.  The liver,  spleen, adrenal glands, and kidneys exhibit no acute abnormalities. The gallbladder is mildly distended. No definite stones are demonstrated. The pancreas exhibits no focal mass nor ductal dilation. The caliber of the abdominal aorta is normal. The periaortic and pericaval regions appear normal. The psoas musculature is normal in appearance. The urinary bladder is decompressed with a Foley catheter. The uterus is surgically absent. There are no adnexal masses.  The lumbar vertebral bodies are preserved in height. There is wedge compression of the body of T12 with loss of height anteriorly of 50%. This was demonstrated on the previous lumbar spine series of April 2015, but it is new since a CT scan of October 2011. Vacuum disc phenomena at T11-12 and T12-L1 are present. There is no retropulsed bony fragment. The bony pelvis exhibits no acute abnormality. The lung bases exhibit mild emphysematous changes.  There is a small pleural effusion layering posteriorly on the right. A trace of pleural fluid is present on the left.  IMPRESSION:  1. There is a large lower abdominal and pelvic ventral wall hernia containing loops of normal calibered small and large bowel as well as normal appearing mesenteric fat. No inflammatory changes are demonstrated within the hernia proper. There is a small amount of increased density in the subcutaneous fat over the lower aspect of the hernia on images 73 through 80. This may reflect inflammatory change of the skin and subcutaneous tissues 2. There is mild distention of the gallbladder without evidence of gallstones or surrounding inflammatory change. No other acute hepatobiliary abnormality is demonstrated. 3. There is no evidence of a small or large bowel obstruction nor evidence of colitis or diverticulitis. 4. There is a small right pleural effusion and trace left pleural effusion. There are emphysematous changes at both lung bases. 5. There is a known wedge compression of the body of T12.   Electronically Signed   By: David  Martinique   On: 04/07/2014 22:32   Ct Head Wo Contrast  04/07/2014   CLINICAL DATA:  Altered mental status  EXAM: CT HEAD WITHOUT CONTRAST  TECHNIQUE: Contiguous axial images were obtained from the base of the skull through the vertex without intravenous contrast.  COMPARISON:  CT HEAD W/O CM dated 12/18/2012  FINDINGS: The patient is angled on the gantry. Motion artifact degrades imaging. Images were repeated but motion persisted. No acute hemorrhage, infarct, or mass lesion is identified. Mild cortical volume loss with proportional ventricular prominence reidentified. Orbits are unremarkable. Mild pansinusitis. No skull fracture.  IMPRESSION: No acute intracranial findings.  Mild pansinusitis.   Electronically Signed   By: Conchita Paris M.D.   On: 04/07/2014 11:48   Dg Lumbar Spine 1 View  04/08/2014   CLINICAL DATA:  Status post fall  EXAM: LUMBAR SPINE - 1 VIEW  COMPARISON:  CT ABD/PELV WO CM dated 04/07/2014  FINDINGS: Degenerative change of the lower thoracic spine. Otherwise  evaluation for lumbar spine fracture is nondiagnostic given single AP view and patient rotation.  IMPRESSION: Unable to exclude lumbar spine fracture given patient rotation and single AP view. Recommend correlation with dedicated AP and lateral lumbar spine radiographs or CT to exclude fracture.   Electronically Signed   By: Lovey Newcomer M.D.   On: 04/08/2014 09:59   Dg Thoracic Spine 1 View  04/08/2014   CLINICAL DATA:  Status post fall.  EXAM: THORACIC SPINE - 1 VIEW  COMPARISON:  None  FINDINGS: Portable examination is nondiagnostic for evaluation of spine fracture given the technique and exposure.  The osseous thoracic spine is not well visualized on current examination. No lateral views are demonstrated.  IMPRESSION: Nondiagnostic examination for evaluation of thoracic spine fracture. Recommend correlation with repeat examination or CT as clinically indicated.   Electronically Signed   By: Lovey Newcomer M.D.   On: 04/08/2014 09:58   Dg Chest Port 1 View  04/07/2014   CLINICAL DATA:  Hypertension and shortness of breath  EXAM: PORTABLE CHEST - 1 VIEW  COMPARISON:  03/23/2014  FINDINGS: Apparent hazy opacity at the right apex is likely overlapping soft tissues given the underlying lung markings appear preserved/symmetric. Normal heart size and mediastinal contours for technique. No definite consolidation, edema, effusion, or pneumothorax.  IMPRESSION: No edema or definitive pneumonia.  Asymmetric density at the right apex is favored technical, but if no resolution of symptoms recommend follow-up chest x-ray.   Electronically Signed   By: Jorje Guild M.D.   On: 04/07/2014 11:18    Pertinent items are noted in HPI. Blood pressure 116/56, pulse 104, temperature 98.2 F (36.8 C), temperature source Oral, resp. rate 18, height $RemoveBe'5\' 2"'[id number]$  (1.575 m), weight 90.2 kg (198 lb 13.7 oz), SpO2 96.00%. She is somewhat somnolent but awakens easily. She is oriented to person place and time but does not appear to be  mentating at full capacity. Her speech is sparse but fluent. Cranial nerve function is intact bilaterally. Motor and sensory function of her upper extremities is normal. She has 0/5 strength in bilateral lower extremities. She has a relative sensory level intermittent thoracic spine. She has preservation of deep pressure and some pain response in both lower extremities. Position sense is diminished. She still has lower extremity reflexes. She has no lower extremity clonus.  Assessment/Plan: Acute bilateral lower extremity paraplegia secondary to severe thoracic stenosis at T6. I discussed situation with the patient's family and the patient. Given her overall medical condition and the length of time of her paraplegia it is unknown whether decompressive surgery we'll give her any benefit however, in my opinion it is her only chance for possible improvement. I discussed the possibility of moving forward with an emergent thoracic laminectomy for spinal cord decompression. I've discussed the risks and benefits. These include but are not limited to the risk of anesthesia, bleeding, infection, CSF leak, nerve root or spinal cord injury, continued pain, and most importantly nonbenefit. The patient and her husband wished to proceed.  Mallie Mussel A Jalysa Swopes 04/08/2014, 8:05 PM

## 2014-04-08 NOTE — Anesthesia Procedure Notes (Addendum)
Procedure Name: Intubation Date/Time: 04/08/2014 10:21 PM Performed by: Jacquiline Doe A Pre-anesthesia Checklist: Patient identified, Timeout performed, Emergency Drugs available, Suction available and Patient being monitored Patient Re-evaluated:Patient Re-evaluated prior to inductionOxygen Delivery Method: Circle system utilized Preoxygenation: Pre-oxygenation with 100% oxygen Intubation Type: IV induction, Rapid sequence and Cricoid Pressure applied Laryngoscope Size: Mac and 4 Grade View: Grade I Tube type: Subglottic suction tube Tube size: 7.5 mm Number of attempts: 1 Airway Equipment and Method: Stylet Placement Confirmation: ETT inserted through vocal cords under direct vision,  breath sounds checked- equal and bilateral and positive ETCO2 Secured at: 22 cm Tube secured with: Tape Dental Injury: Teeth and Oropharynx as per pre-operative assessment  Comments: Pt edentulous .

## 2014-04-08 NOTE — Progress Notes (Signed)
Patient transferred to [facility] via [facility]. Family aware, and will meet patient there. Will call [place] with report.

## 2014-04-08 NOTE — Progress Notes (Signed)
CRITICAL VALUE ALERT  Critical value received:  Positive blood culture  Date of notification:  04/08/14  Time of notification:  0815  Critical value read back:yes  Nurse who received alert:  Girard Cooter RN  MD notified (1st page):  Dr Sarajane Jews  Time of first page:  [phone]

## 2014-04-08 NOTE — Progress Notes (Signed)
Report given to Ti Amado Coe RN, [address] [place], at [facility].

## 2014-04-09 ENCOUNTER — Inpatient Hospital Stay (HOSPITAL_COMMUNITY): Payer: Medicaid Other

## 2014-04-09 DIAGNOSIS — N179 Acute kidney failure, unspecified: Secondary | ICD-10-CM

## 2014-04-09 DIAGNOSIS — G9340 Encephalopathy, unspecified: Secondary | ICD-10-CM

## 2014-04-09 DIAGNOSIS — J449 Chronic obstructive pulmonary disease, unspecified: Secondary | ICD-10-CM

## 2014-04-09 DIAGNOSIS — M4804 Spinal stenosis, thoracic region: Secondary | ICD-10-CM | POA: Diagnosis present

## 2014-04-09 DIAGNOSIS — G8220 Paraplegia, unspecified: Secondary | ICD-10-CM

## 2014-04-09 DIAGNOSIS — I5032 Chronic diastolic (congestive) heart failure: Secondary | ICD-10-CM

## 2014-04-09 LAB — BLOOD GAS, ARTERIAL
ACID-BASE DEFICIT: 0.3 mmol/L (ref 0.0–2.0)
Bicarbonate: 25.2 mEq/L — ABNORMAL HIGH (ref 20.0–24.0)
DRAWN BY: 29017
FIO2: 40 %
LHR: 14 {breaths}/min
O2 SAT: 97.8 %
PATIENT TEMPERATURE: 97.9
PEEP: 5 cmH2O
TCO2: 26.8 mmol/L (ref 0–100)
VT: 500 mL
pCO2 arterial: 50.7 mmHg — ABNORMAL HIGH (ref 35.0–45.0)
pH, Arterial: 7.314 — ABNORMAL LOW (ref 7.350–7.450)
pO2, Arterial: 104 mmHg — ABNORMAL HIGH (ref 80.0–100.0)

## 2014-04-09 LAB — POCT I-STAT 7, (LYTES, BLD GAS, ICA,H+H)
ACID-BASE EXCESS: 8 mmol/L — AB (ref 0.0–2.0)
Acid-Base Excess: 3 mmol/L — ABNORMAL HIGH (ref 0.0–2.0)
Bicarbonate: 27.6 mEq/L — ABNORMAL HIGH (ref 20.0–24.0)
Bicarbonate: 33.1 mEq/L — ABNORMAL HIGH (ref 20.0–24.0)
CALCIUM ION: 1.03 mmol/L — AB (ref 1.12–1.23)
CALCIUM ION: 1.13 mmol/L (ref 1.12–1.23)
HCT: 24 % — ABNORMAL LOW (ref 36.0–46.0)
HEMATOCRIT: 23 % — AB (ref 36.0–46.0)
HEMOGLOBIN: 7.8 g/dL — AB (ref 12.0–15.0)
Hemoglobin: 8.2 g/dL — ABNORMAL LOW (ref 12.0–15.0)
O2 SAT: 100 %
O2 Saturation: 100 %
PCO2 ART: 41.7 mmHg (ref 35.0–45.0)
PCO2 ART: 47.8 mmHg — AB (ref 35.0–45.0)
PH ART: 7.426 (ref 7.350–7.450)
PO2 ART: 477 mmHg — AB (ref 80.0–100.0)
POTASSIUM: 3.5 meq/L — AB (ref 3.7–5.3)
Potassium: 3.6 mEq/L — ABNORMAL LOW (ref 3.7–5.3)
SODIUM: 145 meq/L (ref 137–147)
Sodium: 143 mEq/L (ref 137–147)
TCO2: 29 mmol/L (ref 0–100)
TCO2: 35 mmol/L (ref 0–100)
pH, Arterial: 7.447 (ref 7.350–7.450)
pO2, Arterial: 484 mmHg — ABNORMAL HIGH (ref 80.0–100.0)

## 2014-04-09 LAB — GLUCOSE, CAPILLARY
GLUCOSE-CAPILLARY: 134 mg/dL — AB (ref 70–99)
GLUCOSE-CAPILLARY: 120 mg/dL — AB (ref 70–99)
Glucose-Capillary: 113 mg/dL — ABNORMAL HIGH (ref 70–99)
Glucose-Capillary: 142 mg/dL — ABNORMAL HIGH (ref 70–99)
Glucose-Capillary: 143 mg/dL — ABNORMAL HIGH (ref 70–99)
Glucose-Capillary: 137 mg/dL — ABNORMAL HIGH (ref 70–99)

## 2014-04-09 LAB — URINE CULTURE
Colony Count: NO GROWTH
Culture: NO GROWTH

## 2014-04-09 LAB — POCT I-STAT 3, ART BLOOD GAS (G3+)
ACID-BASE EXCESS: 1 mmol/L (ref 0.0–2.0)
Bicarbonate: 27.9 mEq/L — ABNORMAL HIGH (ref 20.0–24.0)
O2 SAT: 95 %
PCO2 ART: 54.4 mmHg — AB (ref 35.0–45.0)
Patient temperature: 97.7
TCO2: 30 mmol/L (ref 0–100)
pH, Arterial: 7.316 — ABNORMAL LOW (ref 7.350–7.450)
pO2, Arterial: 83 mmHg (ref 80.0–100.0)

## 2014-04-09 LAB — ABO/RH: ABO/RH(D): A POS

## 2014-04-09 LAB — POCT I-STAT GLUCOSE
GLUCOSE: 130 mg/dL — AB (ref 70–99)
OPERATOR ID: 147011

## 2014-04-09 LAB — PREPARE RBC (CROSSMATCH)

## 2014-04-09 MED ORDER — PANTOPRAZOLE SODIUM 40 MG IV SOLR
40.0000 mg | Freq: Every day | INTRAVENOUS | Status: DC
  Administered 2014-04-09 – 2014-04-12 (×4): 40 mg via INTRAVENOUS
  Filled 2014-04-09 (×6): qty 40

## 2014-04-09 MED ORDER — MENTHOL 3 MG MT LOZG: 1.0000 | LOZENGE | OROMUCOSAL | Status: DC | PRN

## 2014-04-09 MED ORDER — POLYETHYLENE GLYCOL 3350 17 G PO PACK
17.0000 g | PACK | Freq: Every day | ORAL | Status: DC | PRN
  Administered 2014-04-17: 17 g via ORAL
  Filled 2014-04-09 (×2): qty 1

## 2014-04-09 MED ORDER — BIOTENE DRY MOUTH MT LIQD
15.0000 mL | Freq: Four times a day (QID) | OROMUCOSAL | Status: DC
  Administered 2014-04-09 – 2014-05-18 (×152): 15 mL via OROMUCOSAL

## 2014-04-09 MED ORDER — HYDROMORPHONE HCL PF 1 MG/ML IJ SOLN
0.5000 mg | INTRAMUSCULAR | Status: DC | PRN
  Administered 2014-04-09: 0.5 mg via INTRAVENOUS
  Administered 2014-04-10 (×2): 1 mg via INTRAVENOUS
  Filled 2014-04-09 (×3): qty 1

## 2014-04-09 MED ORDER — FENTANYL CITRATE 0.05 MG/ML IJ SOLN
12.5000 ug | INTRAMUSCULAR | Status: DC | PRN
  Administered 2014-04-09 (×2): 12.5 ug via INTRAVENOUS
  Filled 2014-04-09 (×2): qty 2

## 2014-04-09 MED ORDER — MEPERIDINE HCL 25 MG/ML IJ SOLN: 6.2500 mg | INTRAMUSCULAR | Status: DC | PRN

## 2014-04-09 MED ORDER — PROMETHAZINE HCL 25 MG/ML IJ SOLN: 6.2500 mg | INTRAMUSCULAR | Status: DC | PRN

## 2014-04-09 MED ORDER — OXYCODONE HCL 5 MG PO TABS: 5.0000 mg | ORAL_TABLET | Freq: Once | ORAL | Status: DC | PRN

## 2014-04-09 MED ORDER — FLEET ENEMA 7-19 GM/118ML RE ENEM
1.0000 | ENEMA | Freq: Once | RECTAL | Status: AC | PRN
Start: 2014-04-09 — End: 2014-04-09
  Filled 2014-04-09: qty 1

## 2014-04-09 MED ORDER — SODIUM CHLORIDE 0.9 % IJ SOLN
3.0000 mL | Freq: Two times a day (BID) | INTRAMUSCULAR | Status: DC
  Administered 2014-04-09 – 2014-04-12 (×8): 3 mL via INTRAVENOUS

## 2014-04-09 MED ORDER — PROPOFOL 10 MG/ML IV EMUL
5.0000 ug/kg/min | INTRAVENOUS | Status: DC
  Administered 2014-04-09 (×2): 5 ug/kg/min via INTRAVENOUS
  Filled 2014-04-09 (×2): qty 100

## 2014-04-09 MED ORDER — SODIUM CHLORIDE 0.9 % IV SOLN
INTRAVENOUS | Status: DC
  Administered 2014-04-09: 02:00:00 via INTRAVENOUS

## 2014-04-09 MED ORDER — CHLORHEXIDINE GLUCONATE 0.12 % MT SOLN
15.0000 mL | Freq: Two times a day (BID) | OROMUCOSAL | Status: DC
  Administered 2014-04-09 – 2014-05-18 (×78): 15 mL via OROMUCOSAL
  Filled 2014-04-09 (×82): qty 15

## 2014-04-09 MED ORDER — MIDAZOLAM HCL 2 MG/2ML IJ SOLN
INTRAMUSCULAR | Status: DC | PRN
  Administered 2014-04-09: 2 mg via INTRAVENOUS

## 2014-04-09 MED ORDER — OXYCODONE HCL 5 MG/5ML PO SOLN: 5.0000 mg | Freq: Once | ORAL | Status: DC | PRN

## 2014-04-09 MED ORDER — WHITE PETROLATUM GEL
Status: AC
  Administered 2014-04-10: 01:00:00
  Filled 2014-04-09: qty 5

## 2014-04-09 MED ORDER — LABETALOL HCL 5 MG/ML IV SOLN
20.0000 mg | Freq: Once | INTRAVENOUS | Status: AC
  Administered 2014-04-09: 20 mg via INTRAVENOUS
  Filled 2014-04-09: qty 4

## 2014-04-09 MED ORDER — FENTANYL CITRATE 0.05 MG/ML IJ SOLN
INTRAMUSCULAR | Status: AC
  Filled 2014-04-09: qty 5

## 2014-04-09 MED ORDER — SENNA 8.6 MG PO TABS
1.0000 | ORAL_TABLET | Freq: Two times a day (BID) | ORAL | Status: DC
  Administered 2014-04-09 – 2014-05-11 (×56): 8.6 mg via ORAL
  Filled 2014-04-09 (×68): qty 1

## 2014-04-09 MED ORDER — SODIUM CHLORIDE 0.9 % IJ SOLN: 3.0000 mL | INTRAMUSCULAR | Status: DC | PRN

## 2014-04-09 MED ORDER — THROMBIN 5000 UNITS EX SOLR
OROMUCOSAL | Status: DC | PRN
  Administered 2014-04-08 – 2014-04-09 (×2): via TOPICAL

## 2014-04-09 MED ORDER — GABAPENTIN 250 MG/5ML PO SOLN
100.0000 mg | Freq: Three times a day (TID) | ORAL | Status: DC
  Administered 2014-04-09: 100 mg via ORAL
  Filled 2014-04-09 (×6): qty 2

## 2014-04-09 MED ORDER — LIDOCAINE HCL (CARDIAC) 20 MG/ML IV SOLN
INTRAVENOUS | Status: DC | PRN
  Administered 2014-04-09: 100 mg via INTRAVENOUS

## 2014-04-09 MED ORDER — INSULIN ASPART 100 UNIT/ML ~~LOC~~ SOLN
0.0000 [IU] | SUBCUTANEOUS | Status: DC
  Administered 2014-04-09 – 2014-04-10 (×3): 2 [IU] via SUBCUTANEOUS
  Administered 2014-04-10 (×2): 3 [IU] via SUBCUTANEOUS
  Administered 2014-04-11 (×3): 2 [IU] via SUBCUTANEOUS
  Administered 2014-04-12: 3 [IU] via SUBCUTANEOUS
  Administered 2014-04-12 (×2): 5 [IU] via SUBCUTANEOUS
  Administered 2014-04-12: 3 [IU] via SUBCUTANEOUS
  Administered 2014-04-12 (×3): 2 [IU] via SUBCUTANEOUS
  Administered 2014-04-13: 5 [IU] via SUBCUTANEOUS
  Administered 2014-04-13: 3 [IU] via SUBCUTANEOUS
  Administered 2014-04-13 (×2): 5 [IU] via SUBCUTANEOUS
  Administered 2014-04-13: 3 [IU] via SUBCUTANEOUS
  Administered 2014-04-14 (×3): 8 [IU] via SUBCUTANEOUS
  Administered 2014-04-14: 5 [IU] via SUBCUTANEOUS
  Administered 2014-04-14: 3 [IU] via SUBCUTANEOUS
  Administered 2014-04-14 – 2014-04-15 (×3): 5 [IU] via SUBCUTANEOUS
  Administered 2014-04-15: 8 [IU] via SUBCUTANEOUS
  Administered 2014-04-15 – 2014-04-16 (×4): 3 [IU] via SUBCUTANEOUS
  Administered 2014-04-16: 8 [IU] via SUBCUTANEOUS
  Administered 2014-04-16 (×3): 5 [IU] via SUBCUTANEOUS
  Administered 2014-04-16: 3 [IU] via SUBCUTANEOUS
  Administered 2014-04-17: 5 [IU] via SUBCUTANEOUS
  Administered 2014-04-17: 2 [IU] via SUBCUTANEOUS
  Administered 2014-04-17 – 2014-04-18 (×3): 3 [IU] via SUBCUTANEOUS
  Administered 2014-04-18: 5 [IU] via SUBCUTANEOUS
  Administered 2014-04-18: 3 [IU] via SUBCUTANEOUS
  Administered 2014-04-19: 5 [IU] via SUBCUTANEOUS
  Administered 2014-04-19: 3 [IU] via SUBCUTANEOUS
  Administered 2014-04-19: 5 [IU] via SUBCUTANEOUS
  Administered 2014-04-19 (×3): 2 [IU] via SUBCUTANEOUS
  Administered 2014-04-20: 3 [IU] via SUBCUTANEOUS
  Administered 2014-04-20: 5 [IU] via SUBCUTANEOUS
  Administered 2014-04-20 (×3): 3 [IU] via SUBCUTANEOUS
  Administered 2014-04-20: 2 [IU] via SUBCUTANEOUS
  Administered 2014-04-21: 3 [IU] via SUBCUTANEOUS
  Administered 2014-04-21: 5 [IU] via SUBCUTANEOUS
  Administered 2014-04-21: 2 [IU] via SUBCUTANEOUS
  Administered 2014-04-21: 3 [IU] via SUBCUTANEOUS
  Administered 2014-04-21 – 2014-04-22 (×7): 2 [IU] via SUBCUTANEOUS
  Administered 2014-04-23: 3 [IU] via SUBCUTANEOUS
  Administered 2014-04-23 – 2014-04-24 (×3): 2 [IU] via SUBCUTANEOUS
  Administered 2014-04-24: 3 [IU] via SUBCUTANEOUS
  Administered 2014-04-24: 2 [IU] via SUBCUTANEOUS
  Administered 2014-04-24 (×2): 3 [IU] via SUBCUTANEOUS
  Administered 2014-04-25: 2 [IU] via SUBCUTANEOUS
  Administered 2014-04-25 (×2): 3 [IU] via SUBCUTANEOUS
  Administered 2014-04-25: 2 [IU] via SUBCUTANEOUS
  Administered 2014-04-25: 3 [IU] via SUBCUTANEOUS
  Administered 2014-04-25 – 2014-04-28 (×4): 2 [IU] via SUBCUTANEOUS
  Administered 2014-04-28: 3 [IU] via SUBCUTANEOUS
  Administered 2014-04-28: 2 [IU] via SUBCUTANEOUS
  Administered 2014-04-28: 3 [IU] via SUBCUTANEOUS
  Administered 2014-04-28: 2 [IU] via SUBCUTANEOUS
  Administered 2014-04-29: 3 [IU] via SUBCUTANEOUS
  Administered 2014-04-29: 2 [IU] via SUBCUTANEOUS
  Administered 2014-04-29: 3 [IU] via SUBCUTANEOUS
  Administered 2014-04-30: 5 [IU] via SUBCUTANEOUS
  Administered 2014-04-30 (×2): 3 [IU] via SUBCUTANEOUS
  Administered 2014-04-30: 2 [IU] via SUBCUTANEOUS
  Administered 2014-05-01 (×2): 3 [IU] via SUBCUTANEOUS
  Administered 2014-05-01: 2 [IU] via SUBCUTANEOUS
  Administered 2014-05-01 – 2014-05-02 (×3): 3 [IU] via SUBCUTANEOUS
  Administered 2014-05-02 (×2): 2 [IU] via SUBCUTANEOUS
  Administered 2014-05-02: 5 [IU] via SUBCUTANEOUS
  Administered 2014-05-03: 2 [IU] via SUBCUTANEOUS
  Administered 2014-05-03 (×2): 3 [IU] via SUBCUTANEOUS
  Administered 2014-05-04: 2 [IU] via SUBCUTANEOUS
  Administered 2014-05-04: 3 [IU] via SUBCUTANEOUS
  Administered 2014-05-04 (×2): 2 [IU] via SUBCUTANEOUS
  Administered 2014-05-04: 3 [IU] via SUBCUTANEOUS
  Administered 2014-05-05: 2 [IU] via SUBCUTANEOUS
  Administered 2014-05-05: 3 [IU] via SUBCUTANEOUS
  Administered 2014-05-05: 2 [IU] via SUBCUTANEOUS
  Administered 2014-05-05: 3 [IU] via SUBCUTANEOUS
  Administered 2014-05-06: 5 [IU] via SUBCUTANEOUS
  Administered 2014-05-06: 3 [IU] via SUBCUTANEOUS
  Administered 2014-05-06 – 2014-05-07 (×2): 2 [IU] via SUBCUTANEOUS
  Administered 2014-05-07: 3 [IU] via SUBCUTANEOUS
  Administered 2014-05-07 (×2): 2 [IU] via SUBCUTANEOUS
  Administered 2014-05-07 – 2014-05-08 (×3): 3 [IU] via SUBCUTANEOUS
  Administered 2014-05-08: 2 [IU] via SUBCUTANEOUS
  Administered 2014-05-08: 3 [IU] via SUBCUTANEOUS
  Administered 2014-05-09: 5 [IU] via SUBCUTANEOUS
  Administered 2014-05-09: 3 [IU] via SUBCUTANEOUS
  Administered 2014-05-09: 5 [IU] via SUBCUTANEOUS
  Administered 2014-05-09: 21:00:00 via SUBCUTANEOUS
  Administered 2014-05-09 (×2): 5 [IU] via SUBCUTANEOUS
  Administered 2014-05-10: 3 [IU] via SUBCUTANEOUS
  Administered 2014-05-10 (×2): 5 [IU] via SUBCUTANEOUS
  Administered 2014-05-10: 3 [IU] via SUBCUTANEOUS
  Administered 2014-05-10: 5 [IU] via SUBCUTANEOUS
  Administered 2014-05-10: 3 [IU] via SUBCUTANEOUS
  Administered 2014-05-11 (×3): 5 [IU] via SUBCUTANEOUS
  Administered 2014-05-11: 8 [IU] via SUBCUTANEOUS
  Administered 2014-05-11: 5 [IU] via SUBCUTANEOUS
  Administered 2014-05-11 – 2014-05-12 (×2): 8 [IU] via SUBCUTANEOUS
  Administered 2014-05-12: 5 [IU] via SUBCUTANEOUS
  Administered 2014-05-12: 15 [IU] via SUBCUTANEOUS

## 2014-04-09 MED ORDER — HYDROMORPHONE HCL PF 1 MG/ML IJ SOLN: 0.2500 mg | INTRAMUSCULAR | Status: DC | PRN

## 2014-04-09 MED ORDER — ALBUTEROL SULFATE (2.5 MG/3ML) 0.083% IN NEBU
2.5000 mg | INHALATION_SOLUTION | RESPIRATORY_TRACT | Status: DC
  Administered 2014-04-09 – 2014-04-12 (×14): 2.5 mg via RESPIRATORY_TRACT
  Filled 2014-04-09 (×14): qty 3

## 2014-04-09 MED ORDER — FUROSEMIDE 10 MG/ML IJ SOLN
40.0000 mg | Freq: Once | INTRAMUSCULAR | Status: AC
Start: 2014-04-09 — End: 2014-04-09
  Administered 2014-04-09: 40 mg via INTRAVENOUS
  Filled 2014-04-09: qty 4

## 2014-04-09 MED ORDER — ACETAMINOPHEN 325 MG PO TABS
650.0000 mg | ORAL_TABLET | ORAL | Status: DC | PRN
  Administered 2014-04-12 – 2014-04-13 (×3): 650 mg via ORAL
  Filled 2014-04-09 (×3): qty 2

## 2014-04-09 MED ORDER — BISACODYL 10 MG RE SUPP: 10.0000 mg | Freq: Every day | RECTAL | Status: DC | PRN

## 2014-04-09 MED ORDER — PHENOL 1.4 % MT LIQD: 1.0000 | OROMUCOSAL | Status: DC | PRN

## 2014-04-09 MED ORDER — ALBUTEROL SULFATE (2.5 MG/3ML) 0.083% IN NEBU
2.5000 mg | INHALATION_SOLUTION | RESPIRATORY_TRACT | Status: DC | PRN
  Administered 2014-04-10 – 2014-05-09 (×2): 2.5 mg via RESPIRATORY_TRACT
  Filled 2014-04-09 (×3): qty 3

## 2014-04-09 MED ORDER — ONDANSETRON HCL 4 MG/2ML IJ SOLN
4.0000 mg | INTRAMUSCULAR | Status: DC | PRN
  Administered 2014-04-18: 4 mg via INTRAVENOUS
  Filled 2014-04-09: qty 2

## 2014-04-09 MED ORDER — ACETAMINOPHEN 650 MG RE SUPP: 650.0000 mg | RECTAL | Status: DC | PRN

## 2014-04-09 MED ORDER — ALUM & MAG HYDROXIDE-SIMETH 200-200-20 MG/5ML PO SUSP
30.0000 mL | Freq: Four times a day (QID) | ORAL | Status: DC | PRN
  Filled 2014-04-09: qty 30

## 2014-04-09 MED ORDER — SODIUM CHLORIDE 0.9 % IV SOLN: 250.0000 mL | INTRAVENOUS | Status: DC

## 2014-04-09 NOTE — Progress Notes (Signed)
Subjective: Went to surgery last night.   Exam: Filed Vitals:   04/09/14 0900  BP: 142/72  Pulse: 84  Temp:   Resp: 15   Gen: In bed, NAD MS: awake, following commands.  DH:RCBUL, EOMI Motor: moves upper extremities to command. To noxious stimuli, does have some movements in her legs, unclear if triple flexion vs withdrawal.  Sensory:endorses sensation throughout.    Impression: paraplegia secondary to cord compression now s/p surgery. AMS reportedly was much improved per IM note yesterday, likely multifactorial in nature.   Recommendations: 1) No further recommendations from a neurological standpoint. Please call if there are any furtehr question or concerns.   Roland Rack, MD [facility] [phone]  If 7pm- 7am, please page neurology on call as listed in [place].

## 2014-04-09 NOTE — Progress Notes (Signed)
UR completed.  Guillermo Difrancesco, RN BSN [facility] CCM Trauma/Neuro ICU Case Manager [phone]  

## 2014-04-09 NOTE — Op Note (Signed)
Date of procedure: 04/09/2014  Date of dictation: Same  Service: Neurosurgery  Preoperative diagnosis: T6 fracture with severe stenosis and spinal cord injury  Postoperative diagnosis: Same  Procedure Name: T5, T6, T7, T8 decompressive laminectomy and evacuation of subdural hematoma  Surgeon:Kaiden Pech A.Jacorion Klem, M.D.  Asst. Surgeon: None  Anesthesia: General  Indication: 58 year old female with multiple medical problems presents with subacute paraplegia. Workup demonstrates evidence of critical stenosis at T6 secondary to fractured associated hemorrhage. Patient presents now for emergent decompressive surgery. At  Operative note: After induction anesthesia, patient positioned prone onto Wilson frame and her purply padded. Patient's thoracic region prepped and draped. Incision made extending from approximately T4-T9. This carried down sharply in the midline. Supper off Sexton performed bilaterally exposing the lamina facet joints [id number] and 9. Retractor placed. Decompressive laminectomies then performed using Le[place]ell rongeurs [employer] years high-speed drill to remove the entire lamina of T5, T6, T7, T8 and the superior aspect of lamina of T9 and in  Of lamina of T4. Ligament flavum was elevated and resected piecemeal fashion. Partial facetectomies were performed bilaterally. Epidural hemorrhage was encountered and resected. Spinal cord was completely decompressed. There is no evidence of any gross instability. Wounds and copes irrigated out like solution. A medium Hemovac drain was left in the epidural space. Wounds and close in layers with Vicryl sutures. Steri-Strips triggers were applied. No apparent complications. Patient tolerated the procedure will well and returned to the recovery room postop.

## 2014-04-09 NOTE — Progress Notes (Signed)
Patient and patient husband state that the patient takes her nebs at home every four hours while awake. Patient has expiratory wheeze at this time. I have assessed the patient and based on her score have scheduled treatments Albuterol q 4/ while awake.

## 2014-04-09 NOTE — Consult Note (Signed)
PULMONARY / CRITICAL CARE MEDICINE   Name: JANELLI RITTENBERG MRN: [id number] DOB: February 11, 1956    ADMISSION DATE:  04/07/2014 CONSULTATION DATE:  04/09/2014  REFERRING MD :  Earnie Larsson, MD PRIMARY SERVICE: Neurosurgery   CHIEF COMPLAINT:  Paraplegia   BRIEF PATIENT DESCRIPTION:  58 yo female with COPD, CHF, DM, CAD s/p MI, atrial fibrillation transferred from [facility] for BLE paraplegia, found to have T6 fracture with spinal stenosis and spinal cord compression on MRI. She was taken for decompressive surgery and remained on mechanical ventilation post surgery. We are consulted for ventilator management.  SIGNIFICANT EVENTS / STUDIES:  04/07/2014 Admitted to [facility], hypotensive and with new paraplegia 04/08/2014 Transfer to [facility] 04/08/2014 MR Cervical/Thoracic/Lumbar Spine - worsened T6 compression fracture with paraspinal and epidural hematoma causing cord compression and edema, possible early cord infarct 04/09/2014 Intubation for procedure 04/09/2014 Thoracic laminectomy   LINES / TUBES: ETT 04/09/2014 >>> R radial arterial line 04/08/2014 >>> Foley catheter 04/07/2014 >>> PIV   CULTURES: Blood culture x2 04/07/2014 >>> GPC in clusters Urine culture 04/07/2014 >>>  ANTIBIOTICS: Vancomycin 04/08/2014 >>>  HISTORY OF PRESENT ILLNESS:   58 yo female with multiple medical problems who presented as transfer from [facility] for evaluation of BLE paraplegia. She had an episode 5/16 where she became non-responsive and was taken to [facility] ED where she was found to be hypotensive and responded to resuscitation. She was then found to be unable to move her lower extremities and has lost bowel/bladder control. MRI obtained upon transfer showed T6 fracture with spinal stenosis and spinal cord compression. Neurology and Neurosurgery were consulted and after discussion with patient and her husband, decision was made to proceed with thoracic laminectomy for decompression. After the  procedure, patient remained intubated and on mechanical ventilation and was admitted to the Neuro ICU. We are now consulted for ventilator management.  PAST MEDICAL HISTORY :  Past Medical History  Diagnosis Date  . Asthma   . COPD (chronic obstructive pulmonary disease)   . Hypothyroidism   . Essential hypertension, benign   . Hyperlipidemia   . Type 2 diabetes mellitus   . Atrial fibrillation   . MI (myocardial infarction)     2012  . Pericardial effusion 09/09/2013  . CHF (congestive heart failure)   . On home O2     4L N/C continuous  . Thoracic compression fracture 03/23/2014  . Ventral hernia    Past Surgical History  Procedure Laterality Date  . Hernia repair    . Abdominal hysterectomy    . Stomach surgery      Wound vac currently in place  . Esophagogastroduodenoscopy  11/11/2011    Procedure: ESOPHAGOGASTRODUODENOSCOPY (EGD);  Surgeon: Rogene Houston, MD;  Location: [facility] ENDO SUITE;  Service: Endoscopy;  Laterality: N/A;  . Foreign body removal  11/11/2011    Procedure: FOREIGN BODY REMOVAL;  Surgeon: Rogene Houston, MD;  Location: [facility] ENDO SUITE;  Service: Endoscopy;  Laterality: N/A;  . Tracheal surgery    . Abdominal surgery     Prior to Admission medications   Medication Sig Start Date End Date Taking? Authorizing Provider  albuterol (PROVENTIL HFA;VENTOLIN HFA) 108 (90 BASE) MCG/ACT inhaler Inhale 1 puff into the lungs every 4 (four) hours as needed for wheezing or shortness of breath.   Yes Historical Provider, MD  albuterol (PROVENTIL) (2.5 MG/3ML) 0.083% nebulizer solution Take 3 mLs (2.5 mg total) by nebulization every 6 (six) hours as needed for wheezing or  shortness of breath. 01/22/14  Yes Kathie Dike, MD  ALPRAZolam Duanne Moron) 1 MG tablet Take 1 mg by mouth 3 (three) times daily.    Yes Historical Provider, MD  apixaban (ELIQUIS) 5 MG TABS tablet Take 1 tablet (5 mg total) by mouth 2 (two) times daily. 09/29/13  Yes Lendon Colonel, NP  bisacodyl (DULCOLAX)  5 MG EC tablet Take 5 mg by mouth daily as needed for moderate constipation.   Yes Historical Provider, MD  budesonide (PULMICORT) 0.25 MG/2ML nebulizer solution Take 2 mLs (0.25 mg total) by nebulization 2 (two) times daily. 03/28/14  Yes Radene Gunning, NP  cholecalciferol 400 UNITS tablet Take 1 tablet (400 Units total) by mouth daily. 03/28/14  Yes Lezlie Octave Black, NP  citalopram (CELEXA) 20 MG tablet Take 20 mg by mouth daily.   Yes Historical Provider, MD  diltiazem (CARDIZEM CD) 240 MG 24 hr capsule Take 1 capsule (240 mg total) by mouth daily. 09/29/13  Yes Lendon Colonel, NP  fluticasone (FLONASE) 50 MCG/ACT nasal spray Place 1 spray into both nostrils.    Yes Historical Provider, MD  furosemide (LASIX) 20 MG tablet Take 40 mg by mouth daily.   Yes Historical Provider, MD  gabapentin (NEURONTIN) 100 MG capsule Take 100 mg by mouth 3 (three) times daily.  08/15/13 08/15/14 Yes Historical Provider, MD  guaiFENesin (MUCINEX) 600 MG 12 hr tablet Take 600 mg by mouth daily.   Yes Historical Provider, MD  HYDROcodone-acetaminophen (NORCO) 10-325 MG per tablet Take 1 tablet by mouth 3 (three) times daily.   Yes Historical Provider, MD  insulin glargine (LANTUS) 100 UNIT/ML injection Inject 10 Units into the skin 2 (two) times daily.   Yes Historical Provider, MD  levofloxacin (LEVAQUIN) 500 MG tablet Take 500 mg by mouth daily.   Yes Historical Provider, MD  levothyroxine (SYNTHROID, LEVOTHROID) 112 MCG tablet Take 112 mcg by mouth daily before breakfast.   Yes Historical Provider, MD  metFORMIN (GLUCOPHAGE) 500 MG tablet Take 1 tablet (500 mg total) by mouth 2 (two) times daily with a meal. 12/22/13  Yes Ripudeep K Rai, MD  nystatin (MYCOSTATIN) 100000 UNIT/ML suspension Take 5 mLs (500,000 Units total) by mouth 4 (four) times daily. 03/28/14  Yes Lezlie Octave Black, NP  pantoprazole (PROTONIX) 40 MG tablet Take 40 mg by mouth daily.    Yes Historical Provider, MD  predniSONE (DELTASONE) 10 MG tablet Take 6  tabs daily for 2 days starting 03/29/14 then take 5 tabs daily for 2 days then take 4 tabs daily for 2 das then take 1 tab daily indefinitely. 03/28/14  Yes Lezlie Octave Black, NP  ramipril (ALTACE) 5 MG capsule Take 1 capsule (5 mg total) by mouth daily. 01/22/14  Yes Lezlie Octave Black, NP  simvastatin (ZOCOR) 40 MG tablet Take 40 mg by mouth daily at 6 PM.    Yes Historical Provider, MD  sodium chloride (OCEAN) 0.65 % SOLN nasal spray Place 1 spray into both nostrils as needed for congestion (also available OTC). 12/22/13  Yes Ripudeep Krystal Eaton, MD  theophylline (THEODUR) 200 MG 12 hr tablet Take 400 mg by mouth 2 (two) times daily.   Yes Historical Provider, MD  tiotropium (SPIRIVA) 18 MCG inhalation capsule Place 18 mcg into inhaler and inhale daily.   Yes Historical Provider, MD   Allergies  Allergen Reactions  . Amoxicillin Hives  . Bactrim [Sulfamethoxazole-Tmp Ds] Hives  . Erythromycin Hives  . Keflex [Cephalexin] Hives  . Penicillins Itching and  Swelling    Sweating    FAMILY HISTORY:  Family History  Problem Relation Age of Onset  . Diabetes Mother    SOCIAL HISTORY:  reports that she quit smoking about 4 months ago. Her smoking use included Cigarettes. She has a 52.5 pack-year smoking history. She has never used smokeless tobacco. She reports that she drinks alcohol. She reports that she does not use illicit drugs.  REVIEW OF SYSTEMS:  Unable to obtain secondary to patient intubated and sedated.   VITAL SIGNS: Temp:  [97.9 F (36.6 C)-98.2 F (36.8 C)] 97.9 F (36.6 C) (05/18 0100) Pulse Rate:  [78-118] 85 (05/18 0330) Resp:  [12-18] 15 (05/18 0330) BP: (90-137)/(48-67) 113/48 mmHg (05/18 0330) SpO2:  [91 %-100 %] 98 % (05/18 0330) Arterial Line BP: (151-178)/(59-65) 153/63 mmHg (05/18 0330) FiO2 (%):  [30 %] 30 % (05/18 0100) Weight:  [198 lb 13.7 oz (90.2 kg)-206 lb (93.441 kg)] 206 lb (93.441 kg) (05/18 0100) HEMODYNAMICS:   VENTILATOR SETTINGS: Vent Mode:  [-] PRVC FiO2 (%):   [30 %] 30 % Set Rate:  [14 bmp] 14 bmp Vt Set:  [500 mL] 500 mL PEEP:  [5 cmH20] 5 cmH20 Plateau Pressure:  [22 cmH20] 22 cmH20 INTAKE / OUTPUT: Intake/Output     05/17 0701 - 05/18 0700   I.V. (mL/kg) 1800 (19.3)   Blood 1005   IV Piggyback 250   Total Intake(mL/kg) 3055 (32.7)   Urine (mL/kg/hr) 3250 (1.4)   Blood 600 (0.3)   Total Output 3850   Net -795         PHYSICAL EXAMINATION: General:  Intubated and on mechanical ventilation, no acute distress Neuro:  Sedated but briefly opens eyes and moves upper extremities, does not follow commands  HEENT:  AT, [place], EOMI, ETT in place  Cardiovascular:  RRR, no murmurs/rubs/gallops Lungs:  Clear to auscultation bilaterally anteriorly Abdomen:  +BS, obese, soft, nontender, nondistended  Musculoskeletal:  No clubbing or cyanosis  Skin:  No rash  LABS:  CBC  Recent Labs Lab 04/07/14 1202 04/08/14 0439  WBC 13.0* 13.6*  HGB 9.0* 8.8*  HCT 29.6* 29.1*  PLT 285 318   Coag's No results found for this basename: APTT, INR,  in the last 168 hours BMET  Recent Labs Lab 04/07/14 1202 04/08/14 0439  NA 140 142  K 4.0 4.0  CL 95* 100  CO2 30 30  BUN 49* 37*  CREATININE 1.98* 1.19*  GLUCOSE 185* 102*   Electrolytes  Recent Labs Lab 04/07/14 1202 04/08/14 0439  CALCIUM 8.2* 7.9*   Sepsis Markers  Recent Labs Lab 04/07/14 1200 04/07/14 1450 04/07/14 1459  LATICACIDVEN 1.7  --  0.9  PROCALCITON  --  1.51  --    ABG No results found for this basename: PHART, PCO2ART, PO2ART,  in the last 168 hours Liver Enzymes  Recent Labs Lab 04/07/14 1202 04/08/14 0439  AST 68* 44*  ALT 43* 34  ALKPHOS 52 48  BILITOT 0.2* 0.2*  ALBUMIN 1.9* 1.8*   Cardiac Enzymes No results found for this basename: TROPONINI, PROBNP,  in the last 168 hours Glucose  Recent Labs Lab 04/07/14 2041 04/08/14 0744 04/08/14 1109 04/08/14 1746 04/08/14 2115 04/09/14 0125  GLUCAP 147* 124* 123* 117* 157* 113*    Imaging Ct  Abdomen Pelvis Wo Contrast  04/07/2014   CLINICAL DATA:  Generalized pain and anemia with altered level of consciousness.  EXAM: CT ABDOMEN AND PELVIS WITHOUT CONTRAST  TECHNIQUE: Multidetector CT imaging of the  abdomen and pelvis was performed following the standard protocol without IV contrast. The patient did receive oral contrast material.  COMPARISON:  DG LUMBAR SPINE 2-3 VIEWS dated 02/22/2014; DG ABDOMEN 1V dated 10/03/2013; CT ABD/PELVIS W CM dated 09/02/2010  FINDINGS: Again demonstrated is a broadly necked lower abdominal and pelvic ventral hernia. This contains loops of normal calibered small and large bowel. No inflammatory changes are demonstrated. Since the previous time there has developed an additional hernia pocket along the anterior inferior aspect of the primary hernia. It contains loops of normal calibered small bowel. This is demonstrated best on images 62-70. The stomach is partially distended with gas and contrast and is grossly normal. The jejunum and ileum exhibit no evidence of obstruction. The colon contains a moderate volume of stool without evidence of obstruction or colitis.  The liver, spleen, adrenal glands, and kidneys exhibit no acute abnormalities. The gallbladder is mildly distended. No definite stones are demonstrated. The pancreas exhibits no focal mass nor ductal dilation. The caliber of the abdominal aorta is normal. The periaortic and pericaval regions appear normal. The psoas musculature is normal in appearance. The urinary bladder is decompressed with a Foley catheter. The uterus is surgically absent. There are no adnexal masses.  The lumbar vertebral bodies are preserved in height. There is wedge compression of the body of T12 with loss of height anteriorly of 50%. This was demonstrated on the previous lumbar spine series of April 2015, but it is new since a CT scan of October 2011. Vacuum disc phenomena at T11-12 and T12-L1 are present. There is no retropulsed bony  fragment. The bony pelvis exhibits no acute abnormality. The lung bases exhibit mild emphysematous changes.  There is a small pleural effusion layering posteriorly on the right. A trace of pleural fluid is present on the left.  IMPRESSION: 1. There is a large lower abdominal and pelvic ventral wall hernia containing loops of normal calibered small and large bowel as well as normal appearing mesenteric fat. No inflammatory changes are demonstrated within the hernia proper. There is a small amount of increased density in the subcutaneous fat over the lower aspect of the hernia on images 73 through 80. This may reflect inflammatory change of the skin and subcutaneous tissues 2. There is mild distention of the gallbladder without evidence of gallstones or surrounding inflammatory change. No other acute hepatobiliary abnormality is demonstrated. 3. There is no evidence of a small or large bowel obstruction nor evidence of colitis or diverticulitis. 4. There is a small right pleural effusion and trace left pleural effusion. There are emphysematous changes at both lung bases. 5. There is a known wedge compression of the body of T12.   Electronically Signed   By: David  Martinique   On: 04/07/2014 22:32   Ct Head Wo Contrast  04/07/2014   CLINICAL DATA:  Altered mental status  EXAM: CT HEAD WITHOUT CONTRAST  TECHNIQUE: Contiguous axial images were obtained from the base of the skull through the vertex without intravenous contrast.  COMPARISON:  CT HEAD W/O CM dated 12/18/2012  FINDINGS: The patient is angled on the gantry. Motion artifact degrades imaging. Images were repeated but motion persisted. No acute hemorrhage, infarct, or mass lesion is identified. Mild cortical volume loss with proportional ventricular prominence reidentified. Orbits are unremarkable. Mild pansinusitis. No skull fracture.  IMPRESSION: No acute intracranial findings.  Mild pansinusitis.   Electronically Signed   By: Conchita Paris M.D.   On:  04/07/2014 11:48   Mr Cervical  Spine Wo Contrast  04/08/2014   CLINICAL DATA:  Paraplegia. Lethargy. Recent COPD exacerbation. Altered mental status. Possible apneic episode.  EXAM: MRI CERVICAL SPINE WITHOUT CONTRAST ; MRI THORACIC SPINE WITHOUT CONTRAST; MRI LUMBAR SPINE WITHOUT CONTRAST  TECHNIQUE: Multiplanar, multisequence MR imaging of the CERVICAL, THORACIC, AND LUMBAR spine was performed. No intravenous contrast was administered.  COMPARISON:  03/23/2014 CT chest  FINDINGS: CERVICAL MRI:  The patient was unable to comply with imaging and despite numerous attempts, no diagnostic axial images of the cervical spine could be obtained. The sagittal images of the cervical spine are significantly degraded by motion artifact with resulting reduced sensitivity and specificity.  No edema is observed in the cervical cord on the T2 and inversion recovery weighted images. The visualized portion of the brainstem appears unremarkable. There are likely mild disc bulges or posterior osseous ridging in the cervical spine at C5-6 and C6-7 which may thin the ventral subarachnoid space but which is not causing critical cervical stenosis. Similarly although minor degrees of foraminal impingement cannot be excluded, there is no dramatic foraminal impingement. I do not demonstrate abnormal signal along the epidural space in the cervical region to suggest cervical epidural hematoma, and no vertebral edema or vertebral malalignment is appreciated. The neck is extended during imaging.  THORACIC MRI:  Despite efforts by the technologist and patient, motion artifact is present on today's exam and could not be eliminated. This reduces exam sensitivity and specificity.  There is considerable progression of the T6 compression fracture since the prior CT chest, with over 50% loss of vertebral height and with abnormal paraspinal hematoma and suspected epidural hematoma compressing the thoracic cord with associated cord edema. Epidural  hematoma is present anteriorly at the T5 through T6 levels, and along the left posterolateral epidural space from the T6 level extending down to the T7-8 level. Potential bony retropulsion from T6 may also be contributory. The right T6-7 and left T5-6 and T6-7 neural foramina are effaced. There is paraspinal edema at the T5-T6-T7 levels posteriorly.  There is suspected right foraminal stenosis at the T8-9 level due to spurring and possible disc protrusion.  The T12 compression fracture is similar in degree to the recent CT chest, with left eccentric posterior bony retropulsion and possible disc protrusion contributing to mild left eccentric central narrowing of the thecal sac. The T12 compression is likely subacute given the edema along its superior margin. However, this is not considered a critical stenosis.  LUMBAR MRI:  Type 2 degenerative endplate findings noted at T12-L1. No vertebral edema in the lumbar spine. The T12 compression fracture is discussed above under the thoracic spine MRI report. Distended gallbladder noted.  At L3-4, there is a mild disc bulge without significant impingement.  Graft at L4-5, there is borderline bilateral subarticular lateral recess stenosis and borderline central narrowing of the thecal sac due to disc bulge, central disc protrusion, and mild facet arthropathy. There is 3 mm of grade 1 degenerative anterolisthesis at this level, in addition to a small synovial cyst along the left ligamentum flavum on image 36 of series 2600.  At L5-S1, there is a diffuse disc bulge without impingement, along with mild facet arthropathy.  IMPRESSION: 1. Worsened compression fracture at T6, with paraspinal and epidural hematoma along with potential bony retropulsion causing cord compression and cord edema at this level. Early cord infarct is not excluded. The epidural hematoma is primarily anterior to the thecal sac at the T5-6 level, and along the left posterolateral margin of the  thecal sac  extending from T6 down through T8. Emergent decompression is recommended. 2. A variety of factors limited today's examination, but no other sites of critical impingement are identified. 3. Mild left eccentric central stenosis at the T11-12 level due to disc protrusion and mild bony retropulsion from the subacute T12 compression. However, the T12 compression fracture has not progressed from the recent chest CT, and is not causing a critical degree of impingement. Critical Value/emergent results were called by telephone at the time of interpretation on 04/08/2014 at 8:31 PM to Dr. Earnie Larsson, who verbally acknowledged these results.   Electronically Signed   By: Sherryl Barters M.D.   On: 04/08/2014 20:35   Mr Thoracic Spine Wo Contrast  04/08/2014   CLINICAL DATA:  Paraplegia. Lethargy. Recent COPD exacerbation. Altered mental status. Possible apneic episode.  EXAM: MRI CERVICAL SPINE WITHOUT CONTRAST ; MRI THORACIC SPINE WITHOUT CONTRAST; MRI LUMBAR SPINE WITHOUT CONTRAST  TECHNIQUE: Multiplanar, multisequence MR imaging of the CERVICAL, THORACIC, AND LUMBAR spine was performed. No intravenous contrast was administered.  COMPARISON:  03/23/2014 CT chest  FINDINGS: CERVICAL MRI:  The patient was unable to comply with imaging and despite numerous attempts, no diagnostic axial images of the cervical spine could be obtained. The sagittal images of the cervical spine are significantly degraded by motion artifact with resulting reduced sensitivity and specificity.  No edema is observed in the cervical cord on the T2 and inversion recovery weighted images. The visualized portion of the brainstem appears unremarkable. There are likely mild disc bulges or posterior osseous ridging in the cervical spine at C5-6 and C6-7 which may thin the ventral subarachnoid space but which is not causing critical cervical stenosis. Similarly although minor degrees of foraminal impingement cannot be excluded, there is no dramatic  foraminal impingement. I do not demonstrate abnormal signal along the epidural space in the cervical region to suggest cervical epidural hematoma, and no vertebral edema or vertebral malalignment is appreciated. The neck is extended during imaging.  THORACIC MRI:  Despite efforts by the technologist and patient, motion artifact is present on today's exam and could not be eliminated. This reduces exam sensitivity and specificity.  There is considerable progression of the T6 compression fracture since the prior CT chest, with over 50% loss of vertebral height and with abnormal paraspinal hematoma and suspected epidural hematoma compressing the thoracic cord with associated cord edema. Epidural hematoma is present anteriorly at the T5 through T6 levels, and along the left posterolateral epidural space from the T6 level extending down to the T7-8 level. Potential bony retropulsion from T6 may also be contributory. The right T6-7 and left T5-6 and T6-7 neural foramina are effaced. There is paraspinal edema at the T5-T6-T7 levels posteriorly.  There is suspected right foraminal stenosis at the T8-9 level due to spurring and possible disc protrusion.  The T12 compression fracture is similar in degree to the recent CT chest, with left eccentric posterior bony retropulsion and possible disc protrusion contributing to mild left eccentric central narrowing of the thecal sac. The T12 compression is likely subacute given the edema along its superior margin. However, this is not considered a critical stenosis.  LUMBAR MRI:  Type 2 degenerative endplate findings noted at T12-L1. No vertebral edema in the lumbar spine. The T12 compression fracture is discussed above under the thoracic spine MRI report. Distended gallbladder noted.  At L3-4, there is a mild disc bulge without significant impingement.  Graft at L4-5, there is borderline bilateral subarticular lateral recess  stenosis and borderline central narrowing of the thecal sac  due to disc bulge, central disc protrusion, and mild facet arthropathy. There is 3 mm of grade 1 degenerative anterolisthesis at this level, in addition to a small synovial cyst along the left ligamentum flavum on image 36 of series 2600.  At L5-S1, there is a diffuse disc bulge without impingement, along with mild facet arthropathy.  IMPRESSION: 1. Worsened compression fracture at T6, with paraspinal and epidural hematoma along with potential bony retropulsion causing cord compression and cord edema at this level. Early cord infarct is not excluded. The epidural hematoma is primarily anterior to the thecal sac at the T5-6 level, and along the left posterolateral margin of the thecal sac extending from T6 down through T8. Emergent decompression is recommended. 2. A variety of factors limited today's examination, but no other sites of critical impingement are identified. 3. Mild left eccentric central stenosis at the T11-12 level due to disc protrusion and mild bony retropulsion from the subacute T12 compression. However, the T12 compression fracture has not progressed from the recent chest CT, and is not causing a critical degree of impingement. Critical Value/emergent results were called by telephone at the time of interpretation on 04/08/2014 at 8:31 PM to Dr. Earnie Larsson, who verbally acknowledged these results.   Electronically Signed   By: Sherryl Barters M.D.   On: 04/08/2014 20:35   Mr Lumbar Spine Wo Contrast  04/08/2014   CLINICAL DATA:  Paraplegia. Lethargy. Recent COPD exacerbation. Altered mental status. Possible apneic episode.  EXAM: MRI CERVICAL SPINE WITHOUT CONTRAST ; MRI THORACIC SPINE WITHOUT CONTRAST; MRI LUMBAR SPINE WITHOUT CONTRAST  TECHNIQUE: Multiplanar, multisequence MR imaging of the CERVICAL, THORACIC, AND LUMBAR spine was performed. No intravenous contrast was administered.  COMPARISON:  03/23/2014 CT chest  FINDINGS: CERVICAL MRI:  The patient was unable to comply with imaging and  despite numerous attempts, no diagnostic axial images of the cervical spine could be obtained. The sagittal images of the cervical spine are significantly degraded by motion artifact with resulting reduced sensitivity and specificity.  No edema is observed in the cervical cord on the T2 and inversion recovery weighted images. The visualized portion of the brainstem appears unremarkable. There are likely mild disc bulges or posterior osseous ridging in the cervical spine at C5-6 and C6-7 which may thin the ventral subarachnoid space but which is not causing critical cervical stenosis. Similarly although minor degrees of foraminal impingement cannot be excluded, there is no dramatic foraminal impingement. I do not demonstrate abnormal signal along the epidural space in the cervical region to suggest cervical epidural hematoma, and no vertebral edema or vertebral malalignment is appreciated. The neck is extended during imaging.  THORACIC MRI:  Despite efforts by the technologist and patient, motion artifact is present on today's exam and could not be eliminated. This reduces exam sensitivity and specificity.  There is considerable progression of the T6 compression fracture since the prior CT chest, with over 50% loss of vertebral height and with abnormal paraspinal hematoma and suspected epidural hematoma compressing the thoracic cord with associated cord edema. Epidural hematoma is present anteriorly at the T5 through T6 levels, and along the left posterolateral epidural space from the T6 level extending down to the T7-8 level. Potential bony retropulsion from T6 may also be contributory. The right T6-7 and left T5-6 and T6-7 neural foramina are effaced. There is paraspinal edema at the T5-T6-T7 levels posteriorly.  There is suspected right foraminal stenosis at the T8-9 level  due to spurring and possible disc protrusion.  The T12 compression fracture is similar in degree to the recent CT chest, with left eccentric  posterior bony retropulsion and possible disc protrusion contributing to mild left eccentric central narrowing of the thecal sac. The T12 compression is likely subacute given the edema along its superior margin. However, this is not considered a critical stenosis.  LUMBAR MRI:  Type 2 degenerative endplate findings noted at T12-L1. No vertebral edema in the lumbar spine. The T12 compression fracture is discussed above under the thoracic spine MRI report. Distended gallbladder noted.  At L3-4, there is a mild disc bulge without significant impingement.  Graft at L4-5, there is borderline bilateral subarticular lateral recess stenosis and borderline central narrowing of the thecal sac due to disc bulge, central disc protrusion, and mild facet arthropathy. There is 3 mm of grade 1 degenerative anterolisthesis at this level, in addition to a small synovial cyst along the left ligamentum flavum on image 36 of series 2600.  At L5-S1, there is a diffuse disc bulge without impingement, along with mild facet arthropathy.  IMPRESSION: 1. Worsened compression fracture at T6, with paraspinal and epidural hematoma along with potential bony retropulsion causing cord compression and cord edema at this level. Early cord infarct is not excluded. The epidural hematoma is primarily anterior to the thecal sac at the T5-6 level, and along the left posterolateral margin of the thecal sac extending from T6 down through T8. Emergent decompression is recommended. 2. A variety of factors limited today's examination, but no other sites of critical impingement are identified. 3. Mild left eccentric central stenosis at the T11-12 level due to disc protrusion and mild bony retropulsion from the subacute T12 compression. However, the T12 compression fracture has not progressed from the recent chest CT, and is not causing a critical degree of impingement. Critical Value/emergent results were called by telephone at the time of interpretation on  04/08/2014 at 8:31 PM to Dr. Earnie Larsson, who verbally acknowledged these results.   Electronically Signed   By: Sherryl Barters M.D.   On: 04/08/2014 20:35   Dg Lumbar Spine 1 View  04/08/2014   CLINICAL DATA:  Status post fall  EXAM: LUMBAR SPINE - 1 VIEW  COMPARISON:  CT ABD/PELV WO CM dated 04/07/2014  FINDINGS: Degenerative change of the lower thoracic spine. Otherwise evaluation for lumbar spine fracture is nondiagnostic given single AP view and patient rotation.  IMPRESSION: Unable to exclude lumbar spine fracture given patient rotation and single AP view. Recommend correlation with dedicated AP and lateral lumbar spine radiographs or CT to exclude fracture.   Electronically Signed   By: Lovey Newcomer M.D.   On: 04/08/2014 09:59   Dg Thoracic Spine 1 View  04/08/2014   CLINICAL DATA:  Status post fall.  EXAM: THORACIC SPINE - 1 VIEW  COMPARISON:  None  FINDINGS: Portable examination is nondiagnostic for evaluation of spine fracture given the technique and exposure. The osseous thoracic spine is not well visualized on current examination. No lateral views are demonstrated.  IMPRESSION: Nondiagnostic examination for evaluation of thoracic spine fracture. Recommend correlation with repeat examination or CT as clinically indicated.   Electronically Signed   By: Lovey Newcomer M.D.   On: 04/08/2014 09:58   Portable Chest Xray  04/09/2014   CLINICAL DATA:  Check endotracheal tube position.  EXAM: PORTABLE CHEST - 1 VIEW  COMPARISON:  DG CHEST 1V PORT dated 04/07/2014  FINDINGS: Endotracheal tube tip projects 9 mm above  the carina.  The cardiac silhouette appears mildly enlarged, mild interstitial prominence. No pleural effusions or focal consolidations. No pneumothorax though, lung apices partially obscured by facial structures.  Nasogastric tube past the proximal stomach, side port below the level of the GE junction. Large body habitus. Osseous structures are nonsuspicious. Multiple EKG lines overlie the patient  and may obscure subtle underlying pathology.  IMPRESSION: Endotracheal tube tip projects 9 mm above the carina, recommend at least 1 cm of retraction. Nasogastric tube past the proximal stomach.  Mild cardiomegaly, interstitial prominence suggests pulmonary edema.   Electronically Signed   By: Elon Alas   On: 04/09/2014 03:15   Dg Chest Port 1 View  04/07/2014   CLINICAL DATA:  Hypertension and shortness of breath  EXAM: PORTABLE CHEST - 1 VIEW  COMPARISON:  03/23/2014  FINDINGS: Apparent hazy opacity at the right apex is likely overlapping soft tissues given the underlying lung markings appear preserved/symmetric. Normal heart size and mediastinal contours for technique. No definite consolidation, edema, effusion, or pneumothorax.  IMPRESSION: No edema or definitive pneumonia.  Asymmetric density at the right apex is favored technical, but if no resolution of symptoms recommend follow-up chest x-ray.   Electronically Signed   By: Jorje Guild M.D.   On: 04/07/2014 11:18    ASSESSMENT / PLAN: 58 yo female with multiple medical problems admitted with new onset paraplegia and now s/p thoracic laminectomy. Patient remained intubated post-operatively and we are consulted for ventilator management.  PULMONARY A: Intubated and on mechanical ventilation post-procedure COPD Asthma P:   - Patient remains intubated post-procedure on PRVC with goal 8cc/kg tidal volume, ABG pending  - Daily AM ABG while intubated - ETT withdrawn at least 1 cm, repeat CXR pending to evaluate placement - VAP bundle - Daily awakenings and SBT when able - no signs of COPD/asthma exacerbation, albuterol nebs q6 PRN wheezing    CARDIOVASCULAR A: Diastolic dysfunction P:  - Currently hemodynamically stable - Avoid excess volume  NEUROLOGIC A:  Sedation while on mechanical ventilation P:   - Propofol and PRN Dilaudid - RASS goal 0 to -1 - Daily awakening trials   I have personally obtained a history,  examined the patient, evaluated laboratory and imaging results, formulated the assessment and plan and placed orders.   Maricela Curet, MD Pulmonary and [place] Pager: [phone]  04/09/2014, 4:29 AM

## 2014-04-09 NOTE — Progress Notes (Signed)
ET Tube pulled back 1 cm per MD order. Patient tolerates the procedure very well.

## 2014-04-09 NOTE — Procedures (Signed)
Extubation Procedure Note  Patient Details:   Name: Joanna Perez DOB: [birth year] MRN: [id number]   Airway Documentation:     Evaluation  O2 sats: stable throughout Complications: No apparent complications Patient did tolerate procedure well. Bilateral Breath Sounds: Expiratory wheezes Suctioning: Airway Yes Patient tolerated wean. MD ordered to extubate. Positive for cuff leak. Patient extubated to a 4 Lpm nasal cannula. No signs of dyspnea or stridor. Patient instructed to an [employer], achieving 436mL times 5, with good patient effort. Patient resting comfortably.   Orene Desanctis 04/09/2014, 4:10 PM

## 2014-04-09 NOTE — Transfer of Care (Signed)
Immediate Anesthesia Transfer of Care Note  Patient: Joanna Perez  Procedure(s) Performed: Procedure(s): THORACIC LAMINECTOMY  (N/A)  Patient Location: NICU  Anesthesia Type:General  Level of Consciousness: Patient remains intubated per anesthesia plan  Airway & Oxygen Therapy: Patient placed on Ventilator (see vital sign flow sheet for setting)  Post-op Assessment: Report given to PACU RN and Post -op Vital signs reviewed and stable  Post vital signs: Reviewed and stable  Complications: No apparent anesthesia complications

## 2014-04-09 NOTE — Progress Notes (Addendum)
Postop day 1. Patient remains on ventilator. Awakens easily and follows commands readily with both upper extremities.  Afebrile. Vitals stable. Oxygenating surprisingly well. Drain output fairly low. Urine output good.  Awakens easily. Appears aware. Cranial nerve function intact. Motor and sensory function in both upper extremities  are normal. Still with no voluntary movement of either lower extremity. Sensory examination limited by her intubation. Appears similar to before. Dressing dry.  Status post emergent thoracic decompressive laminectomy. Patient unchanged postop. Continue supportive efforts. May mobilize as tolerated. Absence of improvement postop certainly not unexpected. Continue supportive efforts.

## 2014-04-09 NOTE — Progress Notes (Signed)
[facility] Progress Note Patient Name: Joanna Perez DOB: 29-Feb-1956 MRN: [id number]  Date of Service  04/09/2014   HPI/Events of Note   The pt passed the SBT and ABG ok  eICU Interventions  Extubate    Intervention Category Major Interventions: Respiratory failure - evaluation and management  Elsie Stain 04/09/2014, 3:36 PM

## 2014-04-09 NOTE — Progress Notes (Signed)
PT Cancellation Note  Patient Details Name: Joanna Perez MRN: [id number] DOB: 01-31-1956   Cancelled Treatment:    Reason Eval/Treat Not Completed: Patient not medically ready.  Patient remains on vent.  Will monitor patient until appropriate for PT evaluation.  Thank you.   Despina Pole 04/09/2014, 10:41 AM Carita Pian. Sanjuana Kava, [place] Pager [phone]

## 2014-04-09 NOTE — Progress Notes (Signed)
[facility] Progress Note Patient Name: Joanna Perez DOB: [birth year] MRN: [id number]  Date of Service  04/09/2014   HPI/Events of Note   Pt hypertensive and tachy  eICU Interventions  Gave one dose labetalol at 20mg  IVP   Intervention Category Major Interventions: Hypertension - evaluation and management  Elsie Stain 04/09/2014, 10:54 PM

## 2014-04-09 NOTE — Brief Op Note (Signed)
04/07/2014 - 04/09/2014   12:25 AM   PATIENT:  Joanna Perez  58 y.o. female  PRE-OPERATIVE DIAGNOSIS:  Thoracic Compression with acute quadroplegia  POST-OPERATIVE DIAGNOSIS:  Thoracic Compression with acute quadroplegia  PROCEDURE:  Procedure(s): THORACIC LAMINECTOMY  (N/A)  SURGEON:  Surgeon(s) and Role:    * Charlie Pitter, MD - Primary  PHYSICIAN ASSISTANT:   ASSISTANTS: none   ANESTHESIA:   general  EBL:  Total I/O In: 1420 [I.V.:500; Blood:670; IV Piggyback:250] Out: 0045 [Urine:1450]  BLOOD ADMINISTERED:3 units CC PRBC  DRAINS: (Medium) Hemovact drain(s) in the Epidural space with  Suction Open   LOCAL MEDICATIONS USED:  NONE  SPECIMEN:  No Specimen  DISPOSITION OF SPECIMEN:  N/A  COUNTS:  YES  TOURNIQUET:  * No tourniquets in log *  DICTATION: .Dragon Dictation  PLAN OF CARE: Admit to inpatient   PATIENT DISPOSITION:  ICU - intubated and hemodynamically stable.   Delay start of Pharmacological VTE agent (>24hrs) due to surgical blood loss or risk of bleeding: yes

## 2014-04-10 ENCOUNTER — Inpatient Hospital Stay (HOSPITAL_COMMUNITY): Payer: Medicaid Other

## 2014-04-10 ENCOUNTER — Encounter (HOSPITAL_COMMUNITY): Payer: Self-pay | Admitting: Neurosurgery

## 2014-04-10 DIAGNOSIS — J9620 Acute and chronic respiratory failure, unspecified whether with hypoxia or hypercapnia: Secondary | ICD-10-CM

## 2014-04-10 LAB — BLOOD GAS, ARTERIAL
ACID-BASE EXCESS: 2.1 mmol/L — AB (ref 0.0–2.0)
Bicarbonate: 28.7 mEq/L — ABNORMAL HIGH (ref 20.0–24.0)
Drawn by: 313941
FIO2: 0.5 %
LHR: 20 {breaths}/min
O2 SAT: 99 %
PCO2 ART: 68.4 mmHg — AB (ref 35.0–45.0)
PEEP: 5 cmH2O
Patient temperature: 98.6
TCO2: 30.8 mmol/L (ref 0–100)
VT: 400 mL
pH, Arterial: 7.247 — ABNORMAL LOW (ref 7.350–7.450)
pO2, Arterial: 107 mmHg — ABNORMAL HIGH (ref 80.0–100.0)

## 2014-04-10 LAB — BASIC METABOLIC PANEL
BUN: 21 mg/dL (ref 6–23)
CO2: 30 meq/L (ref 19–32)
Calcium: 8.8 mg/dL (ref 8.4–10.5)
Chloride: 112 mEq/L (ref 96–112)
Creatinine, Ser: 0.61 mg/dL (ref 0.50–1.10)
GFR calc Af Amer: >90 mL/min (ref >90)
GFR calc non Af Amer: >90 mL/min (ref >90)
GLUCOSE: 103 mg/dL — AB (ref 70–99)
POTASSIUM: 3.6 meq/L — AB (ref 3.7–5.3)
Sodium: 152 mEq/L — ABNORMAL HIGH (ref 137–147)

## 2014-04-10 LAB — CBC
HCT: 30.9 % — ABNORMAL LOW (ref 36.0–46.0)
Hemoglobin: 9.7 g/dL — ABNORMAL LOW (ref 12.0–15.0)
MCH: 26.3 pg (ref 26.0–34.0)
MCHC: 31.4 g/dL (ref 30.0–36.0)
MCV: 83.7 fL (ref 78.0–100.0)
Platelets: 254 10*3/uL (ref 150–400)
RBC: 3.69 MIL/uL — ABNORMAL LOW (ref 3.87–5.11)
RDW: 19.2 % — ABNORMAL HIGH (ref 11.5–15.5)
WBC: 9.2 10*3/uL (ref 4.0–10.5)

## 2014-04-10 LAB — CULTURE, BLOOD (ROUTINE X 2)

## 2014-04-10 LAB — GLUCOSE, CAPILLARY
Glucose-Capillary: 104 mg/dL — ABNORMAL HIGH (ref 70–99)
Glucose-Capillary: 105 mg/dL — ABNORMAL HIGH (ref 70–99)
Glucose-Capillary: 106 mg/dL — ABNORMAL HIGH (ref 70–99)
Glucose-Capillary: 125 mg/dL — ABNORMAL HIGH (ref 70–99)
Glucose-Capillary: 162 mg/dL — ABNORMAL HIGH (ref 70–99)
Glucose-Capillary: 162 mg/dL — ABNORMAL HIGH (ref 70–99)

## 2014-04-10 MED ORDER — VANCOMYCIN HCL IN DEXTROSE 750-5 MG/150ML-% IV SOLN
750.0000 mg | Freq: Two times a day (BID) | INTRAVENOUS | Status: DC
  Filled 2014-04-10: qty 150

## 2014-04-10 MED ORDER — PROPOFOL 10 MG/ML IV EMUL
INTRAVENOUS | Status: AC
  Filled 2014-04-10: qty 100

## 2014-04-10 MED ORDER — ETOMIDATE 2 MG/ML IV SOLN
INTRAVENOUS | Status: AC
  Filled 2014-04-10: qty 10

## 2014-04-10 MED ORDER — PROPOFOL 10 MG/ML IV EMUL
5.0000 ug/kg/min | INTRAVENOUS | Status: DC
  Administered 2014-04-10: 5 ug/kg/min via INTRAVENOUS
  Administered 2014-04-10: 20 ug/kg/min via INTRAVENOUS
  Administered 2014-04-11: 15 ug/kg/min via INTRAVENOUS
  Filled 2014-04-10 (×2): qty 100

## 2014-04-10 MED ORDER — ALPRAZOLAM 0.25 MG PO TABS: 0.2500 mg | ORAL_TABLET | Freq: Three times a day (TID) | ORAL | Status: DC | PRN

## 2014-04-10 MED ORDER — NALOXONE HCL 0.4 MG/ML IJ SOLN
INTRAMUSCULAR | Status: AC
  Administered 2014-04-10: 0.4 mg via INTRAVENOUS
  Filled 2014-04-10: qty 1

## 2014-04-10 MED ORDER — POTASSIUM CHLORIDE 10 MEQ/100ML IV SOLN
10.0000 meq | INTRAVENOUS | Status: AC
  Administered 2014-04-10 (×2): 10 meq via INTRAVENOUS
  Filled 2014-04-10 (×2): qty 100

## 2014-04-10 MED ORDER — ETOMIDATE 2 MG/ML IV SOLN
20.0000 mg | Freq: Once | INTRAVENOUS | Status: AC
  Administered 2014-04-10: 20 mg via INTRAVENOUS

## 2014-04-10 MED ORDER — NALOXONE HCL 0.4 MG/ML IJ SOLN
0.4000 mg | Freq: Once | INTRAMUSCULAR | Status: AC
  Administered 2014-04-10 (×2): 0.4 mg via INTRAVENOUS
  Filled 2014-04-10: qty 1

## 2014-04-10 MED ORDER — CHLORHEXIDINE GLUCONATE 0.12 % MT SOLN: 15.0000 mL | Freq: Two times a day (BID) | OROMUCOSAL | Status: DC

## 2014-04-10 MED ORDER — VANCOMYCIN HCL IN DEXTROSE 750-5 MG/150ML-% IV SOLN
750.0000 mg | Freq: Two times a day (BID) | INTRAVENOUS | Status: DC
  Administered 2014-04-10 – 2014-04-13 (×6): 750 mg via INTRAVENOUS
  Filled 2014-04-10 (×7): qty 150

## 2014-04-10 MED ORDER — DEXTROSE 5 % IV SOLN
INTRAVENOUS | Status: DC
  Administered 2014-04-10 – 2014-04-11 (×3): via INTRAVENOUS

## 2014-04-10 NOTE — Progress Notes (Signed)
[facility] ADULT ICU REPLACEMENT PROTOCOL FOR AM LAB REPLACEMENT ONLY  The patient does apply for the [facility] Adult ICU Electrolyte Replacment Protocol based on the criteria listed below:   1. Is GFR >/= 40 ml/min? yes  Patient's GFR today is >90 2. Is urine output >/= 0.5 ml/kg/hr for the last 6 hours? yes Patient's UOP is 0.71 ml/kg/hr 3. Is BUN < 60 mg/dL? yes  Patient's BUN today is 21 4. Abnormal electrolyte(s): Potassium 5. Ordered repletion with: Potassium per Protocol  Adam Phenix 04/10/2014 4:35 AM

## 2014-04-10 NOTE — Procedures (Signed)
Name: Joanna Perez MRN: [id number] DOB: Nov 30, 1955   PROCEDURE NOTE  Procedure:  Endotracheal intubation.  Indication:  Unresponsive, CO2 is to high to detect, pH=7.1, partial response to Narcan  Consent:  Consent was implied due to the emergency nature of the procedure.  Anesthesia:  A total of 10 mg of Etomidate was given intravenously.  Procedure summary:  Appropriate equipment was assembled. The patient was identified as Joanna Perez and safety timeout was performed. The patient was placed supine, with head in sniffing position. After adequate level of anesthesia was achieved, a GS3 blade was inserted into the oropharynx and the vocal cords were visualized. A 7.5 endotracheal tube was inserted without difficulty and visualized going through the vocal cords. The stylette was removed and cuff inflated. Colorimetric change was noted on the CO2 meter. Breath sounds were heard over both lung fields equally. ETT was secured at 22 cm lip line.  Post procedure chest xray was ordered.  Complications:  No immediate complications were noted.  Hemodynamic parameters and oxygenation remained stable throughout the procedure.    Penne Lash, M.D. Pulmonary and [place] Pager: [phone]  04/10/2014, 3:27 PM

## 2014-04-10 NOTE — Progress Notes (Signed)
OT Cancellation Note  Patient Details Name: Joanna Perez MRN: [id number] DOB: [birth year]   Cancelled Treatment:    Reason Eval/Treat Not Completed: Medical issues which prohibited therapy - Per RN, pt with respiratory difficulty and monitoring need for BiPAP.  Will hold off on eval this am, and check back this pm.   [place], OTR/L [phone]  04/10/2014, 9:52 AM

## 2014-04-10 NOTE — Progress Notes (Signed)
ETT retracted 2cm from 23 to 21 at the lips. Tube re-secured.

## 2014-04-10 NOTE — Progress Notes (Addendum)
Pt SOB, sats mid 80s, HR 130s, 02 increased to 6L, expiratory wheezes auscultated, and pt's LOC decreasing, no longer FC or opening eyes. Albuterol 2.5 mg prn neb given and RT notified. Sats increased to 87%. RT notified again when pt not improving. Came to bedside and placed pt on bipap per protocol. Per Dr. Earnest Conroy, ABG obtained and Dr. Earnest Conroy arrived to bedside. Sats increased to mid 90s.  Order received for Narcan 0.4 mg IV, which was repeated per order. Pt began to arouse, opened eyes but required intubation per Dr. Earnest Conroy. Etomidate given to pt and propofol infused after intubation. Pt tolerated intubation well.   Latrelle Dodrill

## 2014-04-10 NOTE — Progress Notes (Signed)
eLink Physician-Brief Progress Note Patient Name: Joanna Perez DOB: Mar 10, 1956 MRN: [id number]  Date of Service  04/10/2014   HPI/Events of Note   resp acidosis post intubation, likely taking time to re-equilibrate  eICU Interventions  Maintain vent settings Repeat ABG in AM   Intervention Category Major Interventions: Acid-Base disturbance - evaluation and management  Juanito Doom 04/10/2014, 5:00 PM

## 2014-04-10 NOTE — Evaluation (Signed)
Occupational Therapy Evaluation Patient Details Name: Joanna Perez MRN: [id number] DOB: [birth year] Today's Date: 04/10/2014    History of Present Illness 58 yo with COPD, CHF, DM, CAD s/p MI, atrial fibrillation transferred from [facility] for BLE paraplegia, found to have T6 fracture with spinal stenosis and spinal cord compression on MRI. She was taken for decompressive surgery and remained on mechanical ventilation post surgery.  Pt on home 4L  02.   Clinical Impression   Pt admitted with above. She demonstrates the below listed deficits and will benefit from continued OT to maximize safety and independence with BADLs. Pt presents with T6 paraplegia. Very limited evaluation completed due to cardiopulmonary issues - HR 129 with HOB elevated, and 02 sats 91% on 4L 02 and pt using accessory muscles for breathing.  Pt. With full AROM Lt. UE.  Rt. Shoulder limited to ~80*.  Pt indicates that she has had limitations with Rt. UE, but unable to elaborate due to confusion.  When asked to raise her Rt UE, she consistently raises her left and requires max verbal and tactile cues to attempt to lift Rt. UE - RN notified.  Mobility not attempted due to cardiopulmonary status.  She currently requires total A with all ADLs as she is unable to perform.     Follow Up Recommendations  SNF    Equipment Recommendations  None recommended by OT    Recommendations for Other Services       Precautions / Restrictions Precautions Precautions: Fall      Mobility Bed Mobility                  Transfers                      Balance                                            ADL Overall ADL's : Needs assistance/impaired Eating/Feeding: NPO   Grooming: Wash/dry hands;Wash/dry face;Maximal assistance;Bed level   Upper Body Bathing: Total assistance;Bed level   Lower Body Bathing: Total assistance;+2 for physical assistance;Bed level   Upper Body Dressing : Total  assistance;Bed level   Lower Body Dressing: Total assistance;+2 for physical assistance;Bed level   Toilet Transfer: Total assistance (unable)   Toileting- Clothing Manipulation and Hygiene: +2 for physical assistance;Bed level         General ADL Comments: Pt with limited activity due to HR 129 with HOB elevated and 02 sats 91% on 4L with use of accessory muscles      Vision                 Additional Comments: Pt will locate therapist on both Lt and Rt.    Perception Perception Spatial deficits: When asked to raise her Rt UE, pt consistently lifts her Lt. UE.  Only raises her Rt UE with max prompting   Praxis      Pertinent Vitals/Pain Pt with complaint 10/10 pain in her back.   Emotional support provided.      Hand Dominance Right   Extremity/Trunk Assessment Upper Extremity Assessment Upper Extremity Assessment: RUE deficits/detail;LUE deficits/detail RUE Deficits / Details: Pt with shoulder elevation to ~80*.  Pt indicates that she possibly has had long term deficit, but unable to confirm and pt unable to provide details.  Elbow grossly WFL, grip 4/5.  LUE Deficits / Details: grossly 4/5   Lower Extremity Assessment Lower Extremity Assessment: Defer to PT evaluation       Communication Communication Communication: No difficulties   Cognition Arousal/Alertness: Awake/alert Behavior During Therapy: Anxious Overall Cognitive Status: Impaired/Different from baseline Area of Impairment: Orientation;Attention;Following commands;Awareness;Problem solving Orientation Level: Disoriented to;Time;Situation Current Attention Level: Sustained   Following Commands: Follows one step commands consistently     Problem Solving: Slow processing;Decreased initiation;Requires verbal cues General Comments: Pt confused   General Comments       Exercises       Shoulder Instructions      Home Living Family/patient expects to be discharged to:: Skilled nursing  facility Living Arrangements: Alone Available Help at Discharge: Family;Available PRN/intermittently                             Additional Comments: Pt confused and unable to provide accurate history       Prior Functioning/Environment Level of Independence: Independent with assistive device(s)        Comments: Pt unable to provide accurate history and unable to confirm this info with family     OT Diagnosis: Generalized weakness;Disturbance of vision;Cognitive deficits;Paresis   OT Problem List: Decreased strength;Decreased activity tolerance;Impaired balance (sitting and/or standing);Decreased range of motion;Decreased cognition;Decreased safety awareness;Decreased knowledge of use of DME or AE;Cardiopulmonary status limiting activity;Pain;Impaired UE functional use;Obesity;Impaired vision/perception   OT Treatment/Interventions: Self-care/ADL training;Therapeutic exercise;Neuromuscular education;DME and/or AE instruction;Therapeutic activities;Cognitive remediation/compensation;Visual/perceptual remediation/compensation;Patient/family education;Balance training    OT Goals(Current goals can be found in the care plan section) Acute Rehab OT Goals OT Goal Formulation: Patient unable to participate in goal setting Time For Goal Achievement: 04/24/14 Potential to Achieve Goals: Fair ADL Goals Pt Will Perform Grooming: with supervision;sitting;bed level (supported sitting) Pt Will Perform Upper Body Bathing: with min assist;bed level Pt/caregiver will Perform Home Exercise Program: Increased strength;Both right and left upper extremity;With theraband;With minimal assist Additional ADL Goal #1: Pt will sit EOB x 8 mins with max A in prep for BADLs  OT Frequency: Min 2X/week   Barriers to D/C: Other (comment)  uncertain of living arrangements       Co-evaluation              End of Session Equipment Utilized During Treatment: Oxygen Nurse Communication: Other  (comment) (eval status)  Activity Tolerance: Treatment limited secondary to medical complications (Comment) (cardiopulmonary limitations) Patient left: in bed;with call bell/phone within reach;with bed alarm set   Time: 1415-1426 OT Time Calculation (min): 11 min Charges:  OT General Charges $OT Visit: 1 Procedure OT Evaluation $Initial OT Evaluation Tier I: 1 Procedure G-Codes:    Ellard Artis M Lagena Strand 04/21/2014, 2:57 PM

## 2014-04-10 NOTE — Progress Notes (Signed)
Patient tolerating extubation well. Remains somnolent however. She will answer some simple questions but this takes great effort. She states that her pain is well controlled at present.  Afebrile. Vital stable. Oxygenation fair. Mild respiratory distress. Drain output remains low. Somnolent but awakens to voice. Oriented times person and year. Speech is sparse but fluent. Motor and sensory function of her upper trimming is normal. Lower extremity function unchanged. 0/5 voluntary strength bilateral lower extremities. Continues to have preservation of pressure and some pain sensation in both lower extremities.  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 Patient unchanged following decompressive surgery which unfortunately is not unexpected. Patient may be mobilized without restriction. Prognosis given her paralysis and multiple medical issues is quite poor.

## 2014-04-10 NOTE — Consult Note (Addendum)
PULMONARY / CRITICAL CARE MEDICINE  Name: Joanna Perez MRN: [id number] DOB: September 08, 1956    ADMISSION DATE:  04/07/2014 CONSULTATION DATE:  04/09/2014  REFERRING MD :  Earnie Larsson, MD PRIMARY SERVICE: Neurosurgery   CHIEF COMPLAINT:  Paraplegia   BRIEF PATIENT DESCRIPTION: 58 yo with COPD, CHF, DM, CAD s/p MI, atrial fibrillation transferred from [facility] for BLE paraplegia, found to have T6 fracture with spinal stenosis and spinal cord compression on MRI. She was taken for decompressive surgery and remained on mechanical ventilation post surgery. We are consulted for ventilator management.  SIGNIFICANT EVENTS / STUDIES:  5/16  Admitted to [facility], hypotensive and with new paraplegia 5/17  Transfer to [facility] 5/17  MR Cervical/Thoracic/Lumbar Spine - worsened T6 compression fracture with paraspinal and epidural hematoma causing cord compression and edema, possible early cord infarct 5/18  Intubation for procedure 5/18  Thoracic laminectomy  LINES / TUBES: ETT 5/18 >>> 5/18 R R AL 5/17 >>> Foley 5/16 >>>  CULTURES: 5/16  Blood >>> GPC in clusters >>> STAPHYLOCOCCUS SPECIES (COAGULASE NEGATIVE) - Oxacillin sensetive 5/16  Urine culture >>> neg  ANTIBIOTICS:  Vancomycin 5/17 >>> Levaquin 5/17 >>> 5/19  INTERVAL HISTORY:  Somewhat lethargic this morning. No complaints.  VITAL SIGNS: Temp:  [97.4 F (36.3 C)-99.2 F (37.3 C)] 97.5 F (36.4 C) (05/19 0800) Pulse Rate:  [89-120] 111 (05/19 1000) Resp:  [12-23] 22 (05/19 1000) BP: (133-186)/(57-86) 157/86 mmHg (05/19 1000) SpO2:  [86 %-99 %] 95 % (05/19 1000) Arterial Line BP: (144-188)/(61-82) 178/76 mmHg (05/19 1000) FiO2 (%):  [40 %] 40 % (05/18 1600) HEMODYNAMICS:   VENTILATOR SETTINGS: Vent Mode:  [-] PSV;CPAP FiO2 (%):  [40 %] 40 % Set Rate:  [12 bmp] 12 bmp Vt Set:  [500 mL] 500 mL PEEP:  [5 cmH20] 5 cmH20 Pressure Support:  [5 cmH20] 5 cmH20 Plateau Pressure:  [20 cmH20] 20 cmH20  INTAKE /  OUTPUT: Intake/Output     05/18 0701 - 05/19 0700 05/19 0701 - 05/20 0700   I.V. (mL/kg) 1427.8 (15.3) 225 (2.4)   Blood     NG/GT 100    IV Piggyback 300    Total Intake(mL/kg) 1827.8 (19.6) 225 (2.4)   Urine (mL/kg/hr) 2075 (0.9) 150 (0.4)   Emesis/NG output 60 (0)    Drains 25 (0)    Blood     Total Output 2160 150   Net -332.2 +75        Stool Occurrence 1 x      PHYSICAL EXAMINATION: General:  No distress Neuro:  Sleepy, but wakes up to stimulation, confused HEENT:  PERRL Cardiovascular:  Regular, no murmurs Lungs:  Bilateral diminished air entry Abdomen:  Soft, bowel sounds diminished Musculoskeletal:  No edema Skin:  No rash  LABS:  CBC  Recent Labs Lab 04/07/14 1202 04/08/14 0439 04/08/14 2313 04/09/14 0014 04/10/14 0355  WBC 13.0* 13.6*  --   --  9.2  HGB 9.0* 8.8* 8.2* 7.8* 9.7*  HCT 29.6* 29.1* 24.0* 23.0* 30.9*  PLT 285 318  --   --  254   Coag's No results found for this basename: APTT, INR,  in the last 168 hours BMET  Recent Labs Lab 04/07/14 1202 04/08/14 0439 04/08/14 2313 04/08/14 2320 04/09/14 0014 04/10/14 0355  NA 140 142 143  --  145 152*  K 4.0 4.0 3.6*  --  3.5* 3.6*  CL 95* 100  --   --   --  112  CO2 30  30  --   --   --  30  BUN 49* 37*  --   --   --  21  CREATININE 1.98* 1.19*  --   --   --  0.61  GLUCOSE 185* 102*  --  130*  --  103*   Electrolytes  Recent Labs Lab 04/07/14 1202 04/08/14 0439 04/10/14 0355  CALCIUM 8.2* 7.9* 8.8   Sepsis Markers  Recent Labs Lab 04/07/14 1200 04/07/14 1450 04/07/14 1459  LATICACIDVEN 1.7  --  0.9  PROCALCITON  --  1.51  --    ABG  Recent Labs Lab 04/09/14 0014 04/09/14 0447 04/09/14 1526  PHART 7.426 7.314* 7.316*  PCO2ART 41.7 50.7* 54.4*  PO2ART 484.0* 104.0* 83.0   Liver Enzymes  Recent Labs Lab 04/07/14 1202 04/08/14 0439  AST 68* 44*  ALT 43* 34  ALKPHOS 52 48  BILITOT 0.2* 0.2*  ALBUMIN 1.9* 1.8*   Cardiac Enzymes No results found for this  basename: TROPONINI, PROBNP,  in the last 168 hours Glucose  Recent Labs Lab 04/09/14 1244 04/09/14 1553 04/09/14 1943 04/09/14 2345 04/10/14 0316 04/10/14 0833  GLUCAP 143* 137* 120* 125* 106* 105*   IMAGING:  Mr Cervical Spine Wo Contrast  04/08/2014   CLINICAL DATA:  Paraplegia. Lethargy. Recent COPD exacerbation. Altered mental status. Possible apneic episode.  EXAM: MRI CERVICAL SPINE WITHOUT CONTRAST ; MRI THORACIC SPINE WITHOUT CONTRAST; MRI LUMBAR SPINE WITHOUT CONTRAST  TECHNIQUE: Multiplanar, multisequence MR imaging of the CERVICAL, THORACIC, AND LUMBAR spine was performed. No intravenous contrast was administered.  COMPARISON:  03/23/2014 CT chest  FINDINGS: CERVICAL MRI:  The patient was unable to comply with imaging and despite numerous attempts, no diagnostic axial images of the cervical spine could be obtained. The sagittal images of the cervical spine are significantly degraded by motion artifact with resulting reduced sensitivity and specificity.  No edema is observed in the cervical cord on the T2 and inversion recovery weighted images. The visualized portion of the brainstem appears unremarkable. There are likely mild disc bulges or posterior osseous ridging in the cervical spine at C5-6 and C6-7 which may thin the ventral subarachnoid space but which is not causing critical cervical stenosis. Similarly although minor degrees of foraminal impingement cannot be excluded, there is no dramatic foraminal impingement. I do not demonstrate abnormal signal along the epidural space in the cervical region to suggest cervical epidural hematoma, and no vertebral edema or vertebral malalignment is appreciated. The neck is extended during imaging.  THORACIC MRI:  Despite efforts by the technologist and patient, motion artifact is present on today's exam and could not be eliminated. This reduces exam sensitivity and specificity.  There is considerable progression of the T6 compression fracture  since the prior CT chest, with over 50% loss of vertebral height and with abnormal paraspinal hematoma and suspected epidural hematoma compressing the thoracic cord with associated cord edema. Epidural hematoma is present anteriorly at the T5 through T6 levels, and along the left posterolateral epidural space from the T6 level extending down to the T7-8 level. Potential bony retropulsion from T6 may also be contributory. The right T6-7 and left T5-6 and T6-7 neural foramina are effaced. There is paraspinal edema at the T5-T6-T7 levels posteriorly.  There is suspected right foraminal stenosis at the T8-9 level due to spurring and possible disc protrusion.  The T12 compression fracture is similar in degree to the recent CT chest, with left eccentric posterior bony retropulsion and possible disc protrusion contributing to  mild left eccentric central narrowing of the thecal sac. The T12 compression is likely subacute given the edema along its superior margin. However, this is not considered a critical stenosis.  LUMBAR MRI:  Type 2 degenerative endplate findings noted at T12-L1. No vertebral edema in the lumbar spine. The T12 compression fracture is discussed above under the thoracic spine MRI report. Distended gallbladder noted.  At L3-4, there is a mild disc bulge without significant impingement.  Graft at L4-5, there is borderline bilateral subarticular lateral recess stenosis and borderline central narrowing of the thecal sac due to disc bulge, central disc protrusion, and mild facet arthropathy. There is 3 mm of grade 1 degenerative anterolisthesis at this level, in addition to a small synovial cyst along the left ligamentum flavum on image 36 of series 2600.  At L5-S1, there is a diffuse disc bulge without impingement, along with mild facet arthropathy.  IMPRESSION: 1. Worsened compression fracture at T6, with paraspinal and epidural hematoma along with potential bony retropulsion causing cord compression and cord  edema at this level. Early cord infarct is not excluded. The epidural hematoma is primarily anterior to the thecal sac at the T5-6 level, and along the left posterolateral margin of the thecal sac extending from T6 down through T8. Emergent decompression is recommended. 2. A variety of factors limited today's examination, but no other sites of critical impingement are identified. 3. Mild left eccentric central stenosis at the T11-12 level due to disc protrusion and mild bony retropulsion from the subacute T12 compression. However, the T12 compression fracture has not progressed from the recent chest CT, and is not causing a critical degree of impingement. Critical Value/emergent results were called by telephone at the time of interpretation on 04/08/2014 at 8:31 PM to Dr. Earnie Larsson, who verbally acknowledged these results.   Electronically Signed   By: Sherryl Barters M.D.   On: 04/08/2014 20:35   Mr Thoracic Spine Wo Contrast  04/08/2014   CLINICAL DATA:  Paraplegia. Lethargy. Recent COPD exacerbation. Altered mental status. Possible apneic episode.  EXAM: MRI CERVICAL SPINE WITHOUT CONTRAST ; MRI THORACIC SPINE WITHOUT CONTRAST; MRI LUMBAR SPINE WITHOUT CONTRAST  TECHNIQUE: Multiplanar, multisequence MR imaging of the CERVICAL, THORACIC, AND LUMBAR spine was performed. No intravenous contrast was administered.  COMPARISON:  03/23/2014 CT chest  FINDINGS: CERVICAL MRI:  The patient was unable to comply with imaging and despite numerous attempts, no diagnostic axial images of the cervical spine could be obtained. The sagittal images of the cervical spine are significantly degraded by motion artifact with resulting reduced sensitivity and specificity.  No edema is observed in the cervical cord on the T2 and inversion recovery weighted images. The visualized portion of the brainstem appears unremarkable. There are likely mild disc bulges or posterior osseous ridging in the cervical spine at C5-6 and C6-7 which may  thin the ventral subarachnoid space but which is not causing critical cervical stenosis. Similarly although minor degrees of foraminal impingement cannot be excluded, there is no dramatic foraminal impingement. I do not demonstrate abnormal signal along the epidural space in the cervical region to suggest cervical epidural hematoma, and no vertebral edema or vertebral malalignment is appreciated. The neck is extended during imaging.  THORACIC MRI:  Despite efforts by the technologist and patient, motion artifact is present on today's exam and could not be eliminated. This reduces exam sensitivity and specificity.  There is considerable progression of the T6 compression fracture since the prior CT chest, with over 50% loss of  vertebral height and with abnormal paraspinal hematoma and suspected epidural hematoma compressing the thoracic cord with associated cord edema. Epidural hematoma is present anteriorly at the T5 through T6 levels, and along the left posterolateral epidural space from the T6 level extending down to the T7-8 level. Potential bony retropulsion from T6 may also be contributory. The right T6-7 and left T5-6 and T6-7 neural foramina are effaced. There is paraspinal edema at the T5-T6-T7 levels posteriorly.  There is suspected right foraminal stenosis at the T8-9 level due to spurring and possible disc protrusion.  The T12 compression fracture is similar in degree to the recent CT chest, with left eccentric posterior bony retropulsion and possible disc protrusion contributing to mild left eccentric central narrowing of the thecal sac. The T12 compression is likely subacute given the edema along its superior margin. However, this is not considered a critical stenosis.  LUMBAR MRI:  Type 2 degenerative endplate findings noted at T12-L1. No vertebral edema in the lumbar spine. The T12 compression fracture is discussed above under the thoracic spine MRI report. Distended gallbladder noted.  At L3-4, there  is a mild disc bulge without significant impingement.  Graft at L4-5, there is borderline bilateral subarticular lateral recess stenosis and borderline central narrowing of the thecal sac due to disc bulge, central disc protrusion, and mild facet arthropathy. There is 3 mm of grade 1 degenerative anterolisthesis at this level, in addition to a small synovial cyst along the left ligamentum flavum on image 36 of series 2600.  At L5-S1, there is a diffuse disc bulge without impingement, along with mild facet arthropathy.  IMPRESSION: 1. Worsened compression fracture at T6, with paraspinal and epidural hematoma along with potential bony retropulsion causing cord compression and cord edema at this level. Early cord infarct is not excluded. The epidural hematoma is primarily anterior to the thecal sac at the T5-6 level, and along the left posterolateral margin of the thecal sac extending from T6 down through T8. Emergent decompression is recommended. 2. A variety of factors limited today's examination, but no other sites of critical impingement are identified. 3. Mild left eccentric central stenosis at the T11-12 level due to disc protrusion and mild bony retropulsion from the subacute T12 compression. However, the T12 compression fracture has not progressed from the recent chest CT, and is not causing a critical degree of impingement. Critical Value/emergent results were called by telephone at the time of interpretation on 04/08/2014 at 8:31 PM to Dr. Earnie Larsson, who verbally acknowledged these results.   Electronically Signed   By: Sherryl Barters M.D.   On: 04/08/2014 20:35   Mr Lumbar Spine Wo Contrast  04/08/2014   CLINICAL DATA:  Paraplegia. Lethargy. Recent COPD exacerbation. Altered mental status. Possible apneic episode.  EXAM: MRI CERVICAL SPINE WITHOUT CONTRAST ; MRI THORACIC SPINE WITHOUT CONTRAST; MRI LUMBAR SPINE WITHOUT CONTRAST  TECHNIQUE: Multiplanar, multisequence MR imaging of the CERVICAL, THORACIC,  AND LUMBAR spine was performed. No intravenous contrast was administered.  COMPARISON:  03/23/2014 CT chest  FINDINGS: CERVICAL MRI:  The patient was unable to comply with imaging and despite numerous attempts, no diagnostic axial images of the cervical spine could be obtained. The sagittal images of the cervical spine are significantly degraded by motion artifact with resulting reduced sensitivity and specificity.  No edema is observed in the cervical cord on the T2 and inversion recovery weighted images. The visualized portion of the brainstem appears unremarkable. There are likely mild disc bulges or posterior osseous ridging in  the cervical spine at C5-6 and C6-7 which may thin the ventral subarachnoid space but which is not causing critical cervical stenosis. Similarly although minor degrees of foraminal impingement cannot be excluded, there is no dramatic foraminal impingement. I do not demonstrate abnormal signal along the epidural space in the cervical region to suggest cervical epidural hematoma, and no vertebral edema or vertebral malalignment is appreciated. The neck is extended during imaging.  THORACIC MRI:  Despite efforts by the technologist and patient, motion artifact is present on today's exam and could not be eliminated. This reduces exam sensitivity and specificity.  There is considerable progression of the T6 compression fracture since the prior CT chest, with over 50% loss of vertebral height and with abnormal paraspinal hematoma and suspected epidural hematoma compressing the thoracic cord with associated cord edema. Epidural hematoma is present anteriorly at the T5 through T6 levels, and along the left posterolateral epidural space from the T6 level extending down to the T7-8 level. Potential bony retropulsion from T6 may also be contributory. The right T6-7 and left T5-6 and T6-7 neural foramina are effaced. There is paraspinal edema at the T5-T6-T7 levels posteriorly.  There is suspected  right foraminal stenosis at the T8-9 level due to spurring and possible disc protrusion.  The T12 compression fracture is similar in degree to the recent CT chest, with left eccentric posterior bony retropulsion and possible disc protrusion contributing to mild left eccentric central narrowing of the thecal sac. The T12 compression is likely subacute given the edema along its superior margin. However, this is not considered a critical stenosis.  LUMBAR MRI:  Type 2 degenerative endplate findings noted at T12-L1. No vertebral edema in the lumbar spine. The T12 compression fracture is discussed above under the thoracic spine MRI report. Distended gallbladder noted.  At L3-4, there is a mild disc bulge without significant impingement.  Graft at L4-5, there is borderline bilateral subarticular lateral recess stenosis and borderline central narrowing of the thecal sac due to disc bulge, central disc protrusion, and mild facet arthropathy. There is 3 mm of grade 1 degenerative anterolisthesis at this level, in addition to a small synovial cyst along the left ligamentum flavum on image 36 of series 2600.  At L5-S1, there is a diffuse disc bulge without impingement, along with mild facet arthropathy.  IMPRESSION: 1. Worsened compression fracture at T6, with paraspinal and epidural hematoma along with potential bony retropulsion causing cord compression and cord edema at this level. Early cord infarct is not excluded. The epidural hematoma is primarily anterior to the thecal sac at the T5-6 level, and along the left posterolateral margin of the thecal sac extending from T6 down through T8. Emergent decompression is recommended. 2. A variety of factors limited today's examination, but no other sites of critical impingement are identified. 3. Mild left eccentric central stenosis at the T11-12 level due to disc protrusion and mild bony retropulsion from the subacute T12 compression. However, the T12 compression fracture has not  progressed from the recent chest CT, and is not causing a critical degree of impingement. Critical Value/emergent results were called by telephone at the time of interpretation on 04/08/2014 at 8:31 PM to Dr. Earnie Larsson, who verbally acknowledged these results.   Electronically Signed   By: Sherryl Barters M.D.   On: 04/08/2014 20:35   Portable Chest Xray In Am  04/10/2014   CLINICAL DATA:  Evaluate endotracheal tube  EXAM: PORTABLE CHEST - 1 VIEW  COMPARISON:  DG CHEST 1V PORT  dated 04/09/2014; DG THORACIC SPINE 1 VIEW dated 04/08/2014; DG CHEST 1V PORT dated 03/23/2014; CT ANGIO CHEST W/CM &/OR WO/CM dated 03/23/2014  FINDINGS: Grossly unchanged borderline enlarged cardiac silhouette and mediastinal contours given persistently reduced lung volumes and kyphotic projection. There is persistent mild rightward deviation of the tracheal air Mild column at the level of thoracic inlet. Interval extubation and removal of enteric tube. Minimally improved aeration of the lungs with persistent findings of pulmonary venous congestion. Grossly unchanged small bilateral effusions and associated bibasilar opacities, left greater than right. Unchanged bones.  IMPRESSION: 1. Interval extubation and removal of enteric tube. No pneumothorax. 2. Improved aeration of the lungs suggests resolving edema and/or atelectasis. 3. Grossly unchanged trace bilateral effusions and associated bibasilar opacities, atelectasis versus infiltrate. Further evaluation with a PA and lateral chest radiograph may be obtained as clinically indicated.   Electronically Signed   By: Sandi Mariscal M.D.   On: 04/10/2014 07:54   Portable Chest Xray  04/09/2014   CLINICAL DATA:  Check endotracheal tube position.  EXAM: PORTABLE CHEST - 1 VIEW  COMPARISON:  DG CHEST 1V PORT dated 04/07/2014  FINDINGS: Endotracheal tube tip projects 9 mm above the carina.  The cardiac silhouette appears mildly enlarged, mild interstitial prominence. No pleural effusions or focal  consolidations. No pneumothorax though, lung apices partially obscured by facial structures.  Nasogastric tube past the proximal stomach, side port below the level of the GE junction. Large body habitus. Osseous structures are nonsuspicious. Multiple EKG lines overlie the patient and may obscure subtle underlying pathology.  IMPRESSION: Endotracheal tube tip projects 9 mm above the carina, recommend at least 1 cm of retraction. Nasogastric tube past the proximal stomach.  Mild cardiomegaly, interstitial prominence suggests pulmonary edema.   Electronically Signed   By: Elon Alas   On: 04/09/2014 03:15   ASSESSMENT / PLAN:  PULMONARY A: Acute on chronic respiratory failure, likely exacerbated by opioids / sedatives  Intubated in setting if Neuro IR procedure COPD, no evidence of exacerbation Suspect OSA / OHS P:    Goal SpO2>92 Supplemental oxygen PRN Minimize sedation as below Albuterol / Pulmicort / Spiriva / Flonase May need NIMV / BiPAP  CARDIOVASCULAR A:  Chronic diastolic heart failure Dyslipidemia  P:  Lipitor  RENAL A:   Hypokalemia Hypernatremia P:   Trend BMP D/c NS Start D5W@75  K 10 x 2  GASTROINTESTINAL A:   At risk for aspiration Nutrition GERD P:   NPO Protonix as preadmission  HEMATOLOGIC A:   Anemia, stable VTE Px P:  Trend CBC SCD  INFECTIOUS A:   MSSA bacteremia Allergy PCN / cephalosporins P:   D/c Levaquin  ENDOCRINE  A:   Normoglycemic On stress dose steroids, indication? P:   SSI D/c Hydrocortisone  NEUROLOGIC A:   S/p spinal cord decompression Acute encephalopathy P:   Decrease Xanax to 0.25 TID PRN D/c Neurontin D/c Dilaudid D/c Norco Fentanyl PRN Preadmission Celexa  I have personally obtained history, examined patient, evaluated and interpreted laboratory and imaging results, reviewed medical records, formulated assessment / plan and placed orders.  Doree Fudge, MD Pulmonary and [place] Pager: [phone]  04/10/2014, 11:06 AM

## 2014-04-10 NOTE — Evaluation (Addendum)
Physical Therapy Evaluation Patient Details Name: Joanna Perez MRN: [id number] DOB: [birth year] Today's Date: 04/10/2014   History of Present Illness  58 yo with COPD, CHF, DM, CAD s/p MI, atrial fibrillation transferred from [facility] for BLE paraplegia, found to have T6 fracture with spinal stenosis and spinal cord compression on MRI. She was taken for decompressive surgery and remained on mechanical ventilation post surgery.  Pt on home 4L  02.  Clinical Impression  Pt admitted with above. Pt currently with functional limitations due to the deficits listed below (see PT Problem List).  Pt will benefit from skilled PT to increase their independence and safety with mobility to allow discharge to the venue listed below. No mobility assessed due to medical status.      Follow Up Recommendations SNF    Equipment Recommendations  Other (comment) (TBD)    Recommendations for Other Services       Precautions / Restrictions Precautions Precautions: Fall      Mobility  Bed Mobility                  Transfers                    Ambulation/Gait                Stairs            Wheelchair Mobility    Modified Rankin (Stroke Patients Only)       Balance                                             Pertinent Vitals/Pain HR 110's.    Home Living Family/patient expects to be discharged to:: Skilled nursing facility Living Arrangements: Alone Available Help at Discharge: Family;Available PRN/intermittently             Additional Comments: Pt confused and unable to provide accurate history     Prior Function Level of Independence: Independent with assistive device(s)         Comments: Pt unable to provide accurate history and unable to confirm this info with family      Hand Dominance   Dominant Hand: Right    Extremity/Trunk Assessment   Upper Extremity Assessment: Defer to OT     Lower Extremity  Assessment: RLE deficits/detail;LLE deficits/detail RLE Deficits / Details: Strength 0/5, absent sensation LLE Deficits / Details: Strength 0/5, absent sensation     Communication   Communication: No difficulties  Cognition Arousal/Alertness: Awake/alert Behavior During Therapy: Anxious Overall Cognitive Status: Impaired/Different from baseline Area of Impairment: Orientation;Attention;Following commands;Awareness;Problem solving Orientation Level: Disoriented to;Time;Situation Current Attention Level: Sustained   Following Commands: Follows one step commands consistently     Problem Solving: Slow processing;Decreased initiation;Requires verbal cues General Comments: Pt confused    General Comments      Exercises        Assessment/Plan    PT Assessment Patient needs continued PT services  PT Diagnosis Altered mental status;Other (comment) (paraplegia)   PT Problem List Decreased strength;Decreased activity tolerance;Decreased balance;Decreased mobility;Cardiopulmonary status limiting activity  PT Treatment Interventions Functional mobility training;Therapeutic activities;Therapeutic exercise;Balance training;Patient/family education   PT Goals (Current goals can be found in the Care Plan section) Acute Rehab PT Goals Patient Stated Goal: Pt unable Time For Goal Achievement: 04/24/14 Potential to Achieve Goals: Fair    Frequency Min 2X/week  Barriers to discharge        Co-evaluation               End of Session   Activity Tolerance: Treatment limited secondary to medical complications (Comment) (SOB/tachycardia) Patient left: in bed Nurse Communication: Other (comment) (rt arm limitations)         Time: 2703-5009 PT Time Calculation (min): 11 min   Charges:   PT Evaluation $Initial PT Evaluation Tier I: 1 Procedure     PT G CodesShary Decamp Devian Bartolomei 04/10/2014, 3:23 PM  [employer] PT [phone]

## 2014-04-10 NOTE — Progress Notes (Signed)
Pt slightly labored, no tachypneic, pt resting. Per MD, will minimize any sedating meds and continue to monitor pt at this time.   Joanna Perez

## 2014-04-11 LAB — BASIC METABOLIC PANEL
BUN: 25 mg/dL — ABNORMAL HIGH (ref 6–23)
CHLORIDE: 111 meq/L (ref 96–112)
CO2: 31 meq/L (ref 19–32)
Calcium: 9.1 mg/dL (ref 8.4–10.5)
Creatinine, Ser: 0.57 mg/dL (ref 0.50–1.10)
GFR calc Af Amer: >90 mL/min (ref >90)
GLUCOSE: 131 mg/dL — AB (ref 70–99)
POTASSIUM: 4.1 meq/L (ref 3.7–5.3)
SODIUM: 149 meq/L — AB (ref 137–147)

## 2014-04-11 LAB — CBC
HEMATOCRIT: 32.3 % — AB (ref 36.0–46.0)
HEMOGLOBIN: 10.1 g/dL — AB (ref 12.0–15.0)
MCH: 26.9 pg (ref 26.0–34.0)
MCHC: 31.3 g/dL (ref 30.0–36.0)
MCV: 85.9 fL (ref 78.0–100.0)
Platelets: 226 10*3/uL (ref 150–400)
RBC: 3.76 MIL/uL — AB (ref 3.87–5.11)
RDW: 20.3 % — ABNORMAL HIGH (ref 11.5–15.5)
WBC: 8.9 10*3/uL (ref 4.0–10.5)

## 2014-04-11 LAB — GLUCOSE, CAPILLARY
GLUCOSE-CAPILLARY: 99 mg/dL (ref 70–99)
GLUCOSE-CAPILLARY: 150 mg/dL — AB (ref 70–99)
Glucose-Capillary: 115 mg/dL — ABNORMAL HIGH (ref 70–99)
Glucose-Capillary: 121 mg/dL — ABNORMAL HIGH (ref 70–99)
Glucose-Capillary: 143 mg/dL — ABNORMAL HIGH (ref 70–99)
Glucose-Capillary: 85 mg/dL (ref 70–99)

## 2014-04-11 LAB — BLOOD GAS, ARTERIAL
Acid-Base Excess: 6.4 mmol/L — ABNORMAL HIGH (ref 0.0–2.0)
BICARBONATE: 31.7 meq/L — AB (ref 20.0–24.0)
Drawn by: 34767
FIO2: 0.4 %
O2 SAT: 97.7 %
PATIENT TEMPERATURE: 98.6
PEEP/CPAP: 5 cmH2O
RATE: 20 resp/min
TCO2: 33.4 mmol/L (ref 0–100)
VT: 400 mL
pCO2 arterial: 57.2 mmHg (ref 35.0–45.0)
pH, Arterial: 7.362 (ref 7.350–7.450)
pO2, Arterial: 93.6 mmHg (ref 80.0–100.0)

## 2014-04-11 MED ORDER — VITAL HIGH PROTEIN PO LIQD
1000.0000 mL | ORAL | Status: DC
  Administered 2014-04-11 – 2014-04-19 (×10): 1000 mL
  Administered 2014-04-19: 0 mL
  Administered 2014-04-20 – 2014-05-04 (×16): 1000 mL
  Filled 2014-04-11 (×32): qty 1000

## 2014-04-11 MED ORDER — DEXMEDETOMIDINE HCL IN NACL 200 MCG/50ML IV SOLN
0.4000 ug/kg/h | INTRAVENOUS | Status: AC
  Administered 2014-04-11 (×4): 0.8 ug/kg/h via INTRAVENOUS
  Administered 2014-04-11: 0.4 ug/kg/h via INTRAVENOUS
  Administered 2014-04-11: 0.8 ug/kg/h via INTRAVENOUS
  Administered 2014-04-12: 1 ug/kg/h via INTRAVENOUS
  Administered 2014-04-12: 0.8 ug/kg/h via INTRAVENOUS
  Administered 2014-04-12: 0.9 ug/kg/h via INTRAVENOUS
  Filled 2014-04-11 (×9): qty 50

## 2014-04-11 MED ORDER — FENTANYL CITRATE 0.05 MG/ML IJ SOLN
25.0000 ug | INTRAMUSCULAR | Status: DC | PRN
  Administered 2014-04-11 (×2): 50 ug via INTRAVENOUS
  Administered 2014-04-12: 25 ug via INTRAVENOUS
  Administered 2014-04-12 – 2014-04-13 (×4): 50 ug via INTRAVENOUS
  Filled 2014-04-11 (×7): qty 2

## 2014-04-11 NOTE — Progress Notes (Signed)
Patient with respiratory failure last night requiring reintubation.  Currently intubated on ventilator. Will awaken. She appears aware. She follow some simple commands with her upper extremities. Still no lower extremity movement.  Afebrile. Vitals stable. Dressing dry. Drain output low. No voluntary movement and bilateral lower extremities. Patient does stents deep pain and pressure.  Status post thoracic decompressive surgery for severe myelopathy with subacute paraplegia. Patient unchanged postop. Prognosis for neurological recovery very poor. General medical condition also very poor. No new recommendations at this point.

## 2014-04-11 NOTE — Anesthesia Postprocedure Evaluation (Signed)
  Anesthesia Post-op Note  Patient: Joanna Perez  Procedure(s) Performed: Procedure(s): THORACIC LAMINECTOMY  (N/A)  Patient Location: ICU  Anesthesia Type:General  Level of Consciousness: sedated, responds to stimulation and Patient remains intubated per anesthesia plan  Airway and Oxygen Therapy: Patient remains intubated per anesthesia plan and Patient placed on Ventilator (see vital sign flow sheet for setting)  Post-op Pain: none  Post-op Assessment: Patient's Cardiovascular Status Stable, No signs of Nausea or vomiting and Pain level controlled, remains intubated as per Neurosurgery  Post-op Vital Signs: Reviewed and stable  Last Vitals:  Filed Vitals:   04/11/14 1600  BP: 106/63  Pulse: 104  Temp: 37.1 C  Resp: 25    Complications: No apparent anesthesia complications

## 2014-04-11 NOTE — Progress Notes (Signed)
ANTIBIOTIC CONSULT NOTE   Pharmacy Consult for Vancomycin  Indication: bacteremia  - MSSE --> allergy to pcn  Allergies  Allergen Reactions  . Amoxicillin Hives  . Bactrim [Sulfamethoxazole-Tmp Ds] Hives  . Erythromycin Hives  . Keflex [Cephalexin] Hives  . Penicillins Itching and Swelling    Sweating    Labs:  Recent Labs  04/09/14 0014 04/10/14 0355 04/11/14 1155  WBC  --  9.2 8.9  HGB 7.8* 9.7* 10.1*  PLT  --  254 226  CREATININE  --  0.61 0.57   Estimated Creatinine Clearance: 82.6 ml/min (by C-G formula based on Cr of 0.57). No results found for this basename: Letta Median, VANCORANDOM, [place], GENTPEAK, GENTRANDOM, TOBRATROUGH, TOBRAPEAK, TOBRARND, AMIKACINPEAK, AMIKACINTROU, AMIKACIN,  in the last 72 hours   Microbiology: Recent Results (from the past 720 hour(s))  URINE CULTURE     Status: None   Collection Time    03/13/14 12:32 PM      Result Value Ref Range Status   Specimen Description URINE, CLEAN CATCH   Final   Special Requests NONE   Final   Culture  Setup Time     Final   Value: 03/14/2014 00:19     Performed at [facility]     Final   Value: >=100,000 COLONIES/ML     Performed at [facility]   Culture     Final   Value: Multiple bacterial morphotypes present, none predominant. Suggest appropriate recollection if clinically indicated.     Performed at [facility]   Report Status 03/15/2014 FINAL   Final  URINE CULTURE     Status: None   Collection Time    04/07/14 11:50 AM      Result Value Ref Range Status   Specimen Description URINE, CATHETERIZED   Final   Special Requests NONE   Final   Culture  Setup Time     Final   Value: 04/08/2014 01:35     Performed at [facility]     Final   Value: NO GROWTH     Performed at [facility]   Culture     Final   Value: NO GROWTH     Performed at [facility]   Report Status 04/09/2014 FINAL   Final   CULTURE, BLOOD (ROUTINE X 2)     Status: None   Collection Time    04/07/14 12:02 PM      Result Value Ref Range Status   Specimen Description BLOOD RIGHT ANTECUBITAL   Final   Special Requests BOTTLES DRAWN AEROBIC ONLY [facility]   Final   Culture  Setup Time     Final   Value: 04/08/2014 21:13     Performed at [facility]   Culture     Final   Value: STAPHYLOCOCCUS SPECIES (COAGULASE NEGATIVE)     Note: RIFAMPIN AND GENTAMICIN SHOULD NOT BE USED AS SINGLE DRUGS FOR TREATMENT OF STAPH INFECTIONS.     Note: Performed at [facility]     Performed at [facility]   Report Status 04/10/2014 FINAL   Final   Organism ID, Bacteria STAPHYLOCOCCUS SPECIES (COAGULASE NEGATIVE)   Final  CULTURE, BLOOD (ROUTINE X 2)     Status: None   Collection Time    04/07/14 12:02 PM      Result Value Ref Range Status   Specimen Description BLOOD RIGHT HAND   Final  Special Requests BOTTLES DRAWN AEROBIC AND ANAEROBIC 8CC   Final   Culture  Setup Time     Final   Value: 04/08/2014 21:16     Performed at [facility]   Culture     Final   Value: STAPHYLOCOCCUS SPECIES (COAGULASE NEGATIVE)     Note: SUSCEPTIBILITIES PERFORMED ON PREVIOUS CULTURE WITHIN THE LAST 5 DAYS.     Note: Performed at [facility] Gram Stain Report Called to,Read Back By and Verified With: STONE A 04/08/14 0811 BY THOMPSON S     Performed at [facility]   Report Status 04/10/2014 FINAL   Final  SURGICAL PCR SCREEN     Status: None   Collection Time    04/08/14  7:54 PM      Result Value Ref Range Status   MRSA, PCR NEGATIVE  NEGATIVE Final   Staphylococcus aureus NEGATIVE  NEGATIVE Final   Comment:            The Xpert SA Assay (FDA     approved for NASAL specimens     in patients over 58 years of age),     is one component of     a comprehensive surveillance     program.  Test performance has     been validated by [employer] for patients greater     than or equal to 66  year old.     It is not intended     to diagnose infection nor to     guide or monitor treatment.   Assessment: 75 yof initally presented with AMS and weakness. Now with positive blood cultures.  Continues on vancomycin  Vanc 5/17>>  5/16 Blood - GPC 2/2 --> MSSE  Goal of Therapy:  Vancomycin trough level 15-20 mcg/ml  Plan:  1. Continue Vancomycin 750mg  IV Q12H 2. F/u renal fxn, C&S, clinical status and trough at [facility]  ? Unknown prognosis  Thank you. Anette Guarneri, PharmD [phone]  04/11/2014,1:54 PM

## 2014-04-11 NOTE — Progress Notes (Addendum)
INITIAL NUTRITION ASSESSMENT  DOCUMENTATION CODES Per approved criteria  -Obesity Unspecified   INTERVENTION:  Initiate Vital High Protein @ 20 ml/hr via OG tube and increase by 10 ml every 4 hours to goal rate of 50 ml/hr.   Tube feeding regimen provides 1200 kcal (24 kcal/kg IBW), 105 grams of protein, and 1003 ml of H2O.    NUTRITION DIAGNOSIS: Inadequate oral intake related to inability to eat as evidenced by NPO status  Goal: Enteral nutrition to provide 60-70% of estimated calorie needs (22-25 kcals/kg ideal body weight) and 100% of estimated protein needs, based on ASPEN guidelines for permissive underfeeding in critically ill obese individuals  Monitor:  TF initiation/tolerance, weight trend, labs  Reason for Assessment: Consult received to initiate and manage enteral nutrition support.  58 y.o. female  Admitting Dx: Acute paraplegia  ASSESSMENT:  Pt admitted with altered mental status and COPD exacerbation. Pt is status post thoracic decompressive surgery for severe myelopathy with subacute paraplegia on 5/18. Per MD note prognosis for neurological recovery is very poor.   Patient is currently intubated on ventilator support MV: 10.8 L/min Temp (24hrs), Avg:98.5 F (36.9 C), Min:98.1 F (36.7 C), Max:99.3 F (37.4 C)  Propofol: off Nutrition-focused physical exam WNL  Height: Ht Readings from Last 1 Encounters:  04/09/14 5\' 2"  (1.575 m)    Weight: Wt Readings from Last 1 Encounters:  04/09/14 206 lb (93.441 kg)  Admission weight 193 lb (87.9 kg) 5/16 BMI: 35.3  Ideal Body Weight: 50 kg   % Ideal Body Weight: 54%  Wt Readings from Last 10 Encounters:  04/09/14 206 lb (93.441 kg)  04/09/14 206 lb (93.441 kg)  03/28/14 191 lb 12.8 oz (87 kg)  03/15/14 194 lb 7.1 oz (88.2 kg)  03/04/14 202 lb 2.6 oz (91.7 kg)  01/26/14 192 lb (87.091 kg)  01/22/14 195 lb 12.3 oz (88.8 kg)  12/22/13 191 lb 12.8 oz (87 kg)  11/29/13 203 lb 12.8 oz (92.443 kg)   11/03/13 210 lb (95.255 kg)    Usual Body Weight: 190-200 lb   % Usual Body Weight: > 100%  BMI:  Body mass index is 37.67 kg/(m^2).  Estimated Nutritional Needs: Kcal: 4854 Protein: >/= 100 grams Fluid: > 1.6 L/day  Skin: stage II pressure ulcer on sacrum  Diet Order: NPO  EDUCATION NEEDS: -No education needs identified at this time   Intake/Output Summary (Last 24 hours) at 04/11/14 1505 Last data filed at 04/11/14 1300  Gross per 24 hour  Intake 2180.63 ml  Output   1150 ml  Net 1030.63 ml    Last BM: 5/18   Labs:   Recent Labs Lab 04/08/14 0439  04/08/14 2320 04/09/14 0014 04/10/14 0355 04/11/14 1155  NA 142  < >  --  145 152* 149*  K 4.0  < >  --  3.5* 3.6* 4.1  CL 100  --   --   --  112 111  CO2 30  --   --   --  30 31  BUN 37*  --   --   --  21 25*  CREATININE 1.19*  --   --   --  0.61 0.57  CALCIUM 7.9*  --   --   --  8.8 9.1  GLUCOSE 102*  --  130*  --  103* 131*  < > = values in this interval not displayed.  CBG (last 3)   Recent Labs  04/11/14 0435 04/11/14 0849 04/11/14 1244  GLUCAP 85  99 121*    Scheduled Meds: . albuterol  2.5 mg Nebulization Q4H WA  . antiseptic oral rinse  15 mL Mouth Rinse BID  . antiseptic oral rinse  15 mL Mouth Rinse QID  . atorvastatin  20 mg Oral q1800  . budesonide  0.25 mg Nebulization BID  . chlorhexidine  15 mL Mouth Rinse BID  . chlorhexidine  15 mL Mouth/Throat BID  . cholecalciferol  400 Units Oral Daily  . citalopram  20 mg Oral Daily  . fluticasone  1 spray Each Nare Daily  . guaiFENesin  600 mg Oral Daily  . insulin aspart  0-15 Units Subcutaneous 6 times per day  . insulin glargine  10 Units Subcutaneous BID  . levothyroxine  112 mcg Oral QAC breakfast  . nystatin  5 mL Oral QID  . pantoprazole (PROTONIX) IV  40 mg Intravenous Daily  . senna  1 tablet Oral BID  . sodium chloride  3 mL Intravenous Q12H  . tiotropium  18 mcg Inhalation Daily  . vancomycin  750 mg Intravenous Q12H     Continuous Infusions: . sodium chloride    . dexmedetomidine 0.8 mcg/kg/hr (04/11/14 1455)  . dextrose 75 mL/hr at 04/11/14 0830    Past Medical History  Diagnosis Date  . Asthma   . COPD (chronic obstructive pulmonary disease)   . Hypothyroidism   . Essential hypertension, benign   . Hyperlipidemia   . Type 2 diabetes mellitus   . Atrial fibrillation   . MI (myocardial infarction)     2012  . Pericardial effusion 09/09/2013  . CHF (congestive heart failure)   . On home O2     4L N/C continuous  . Thoracic compression fracture 03/23/2014  . Ventral hernia     Past Surgical History  Procedure Laterality Date  . Hernia repair    . Abdominal hysterectomy    . Stomach surgery      Wound vac currently in place  . Esophagogastroduodenoscopy  11/11/2011    Procedure: ESOPHAGOGASTRODUODENOSCOPY (EGD);  Surgeon: Rogene Houston, MD;  Location: [facility];  Service: Endoscopy;  Laterality: N/A;  . Foreign body removal  11/11/2011    Procedure: FOREIGN BODY REMOVAL;  Surgeon: Rogene Houston, MD;  Location: [facility];  Service: Endoscopy;  Laterality: N/A;  . Tracheal surgery    . Abdominal surgery    . Laminectomy N/A 04/08/2014    Procedure: THORACIC LAMINECTOMY ;  Surgeon: Charlie Pitter, MD;  Location: [facility];  Service: Neurosurgery;  Laterality: N/A;    Maylon Peppers RD, [place], [place] Pager [phone] After Hours Pager

## 2014-04-11 NOTE — Consult Note (Addendum)
PULMONARY / CRITICAL CARE MEDICINE  Name: Joanna Perez MRN: [id number] DOB: Aug 07, 1956    ADMISSION DATE:  04/07/2014 CONSULTATION DATE:  04/09/2014  REFERRING MD :  Earnie Larsson, MD PRIMARY SERVICE: Neurosurgery   CHIEF COMPLAINT:  Paraplegia   BRIEF PATIENT DESCRIPTION: 58 yo with COPD, CHF, DM, CAD s/p MI, atrial fibrillation transferred from [facility] for BLE paraplegia, found to have T6 fracture with spinal stenosis and spinal cord compression on MRI. She was taken for decompressive surgery and remained on mechanical ventilation post surgery. We are consulted for ventilator management.  SIGNIFICANT EVENTS / STUDIES:  5/16  Admitted to [facility], hypotensive and with new paraplegia 5/17  Transfer to [facility] 5/17  MR Cervical/Thoracic/Lumbar Spine - worsened T6 compression fracture with paraspinal and epidural hematoma causing cord compression and edema, possible early cord infarct 5/18  Intubation for procedure 5/18  Thoracic laminectomy 5/18  Extubate 5/19  Somnolent / hypercarbic >>> reintubated  LINES / TUBES: ETT 5/18 >>> 5/18; 5/19 >>> R R AL 5/17 >>> Foley 5/16 >>>  CULTURES: 5/16  Blood >>> GPC in clusters >>> STAPHYLOCOCCUS SPECIES (COAGULASE NEGATIVE) - Oxacillin sensetive 5/16  Urine culture >>> neg  ANTIBIOTICS:  Vancomycin 5/17 >>> Levaquin 5/17 >>> 5/19  INTERVAL HISTORY:  Agitated while off sedation.  VITAL SIGNS: Temp:  [98.2 F (36.8 C)-99.3 F (37.4 C)] 98.8 F (37.1 C) (05/20 1600) Pulse Rate:  [104-126] 104 (05/20 1600) Resp:  [21-28] 25 (05/20 1600) BP: (106-174)/(63-110) 106/63 mmHg (05/20 1600) SpO2:  [93 %-98 %] 97 % (05/20 1600) Arterial Line BP: (131-215)/(58-132) 177/116 mmHg (05/20 1600) FiO2 (%):  [40 %] 40 % (05/20 1600) HEMODYNAMICS:   VENTILATOR SETTINGS: Vent Mode:  [-] PRVC FiO2 (%):  [40 %] 40 % Set Rate:  [20 bmp] 20 bmp Vt Set:  [400 mL] 400 mL PEEP:  [5 cmH20] 5 cmH20 Plateau Pressure:  [21 cmH20-26 cmH20] 26  cmH20  INTAKE / OUTPUT: Intake/Output     05/19 0701 - 05/20 0700 05/20 0701 - 05/21 0700   I.V. (mL/kg) 1873.4 (20) 655.8 (7)   NG/GT 120    IV Piggyback 300    Total Intake(mL/kg) 2293.4 (24.5) 655.8 (7)   Urine (mL/kg/hr) 975 (0.4) 525 (0.5)   Emesis/NG output     Drains     Total Output 975 525   Net +1318.4 +130.8          PHYSICAL EXAMINATION: General:  No distress Neuro:  Awake, alert, non verbal HEENT:  OETT Cardiovascular:  Regular, no murmurs Lungs:  Bilateral diminished air entry, no added sounds Abdomen:  Soft, bowel sounds diminished Musculoskeletal:  No edema Skin:  No rash  LABS:  CBC  Recent Labs Lab 04/08/14 0439  04/09/14 0014 04/10/14 0355 04/11/14 1155  WBC 13.6*  --   --  9.2 8.9  HGB 8.8*  < > 7.8* 9.7* 10.1*  HCT 29.1*  < > 23.0* 30.9* 32.3*  PLT 318  --   --  254 226  < > = values in this interval not displayed. Coag's No results found for this basename: APTT, INR,  in the last 168 hours BMET  Recent Labs Lab 04/08/14 0439  04/08/14 2320 04/09/14 0014 04/10/14 0355 04/11/14 1155  NA 142  < >  --  145 152* 149*  K 4.0  < >  --  3.5* 3.6* 4.1  CL 100  --   --   --  112 111  CO2 30  --   --   --  30 31  BUN 37*  --   --   --  21 25*  CREATININE 1.19*  --   --   --  0.61 0.57  GLUCOSE 102*  --  130*  --  103* 131*  < > = values in this interval not displayed. Electrolytes  Recent Labs Lab 04/08/14 0439 04/10/14 0355 04/11/14 1155  CALCIUM 7.9* 8.8 9.1   Sepsis Markers  Recent Labs Lab 04/07/14 1200 04/07/14 1450 04/07/14 1459  LATICACIDVEN 1.7  --  0.9  PROCALCITON  --  1.51  --    ABG  Recent Labs Lab 04/09/14 1526 04/10/14 1635 04/11/14 0405  PHART 7.316* 7.247* 7.362  PCO2ART 54.4* 68.4* 57.2*  PO2ART 83.0 107.0* 93.6   Liver Enzymes  Recent Labs Lab 04/07/14 1202 04/08/14 0439  AST 68* 44*  ALT 43* 34  ALKPHOS 52 48  BILITOT 0.2* 0.2*  ALBUMIN 1.9* 1.8*   Cardiac Enzymes No results found  for this basename: TROPONINI, PROBNP,  in the last 168 hours Glucose  Recent Labs Lab 04/10/14 2000 04/11/14 0001 04/11/14 0435 04/11/14 0849 04/11/14 1244 04/11/14 1613  GLUCAP 162* 115* 85 99 121* 150*   IMAGING:  Dg Chest Port 1 View  04/10/2014   CLINICAL DATA:  Assess endotracheal tube placement  EXAM: PORTABLE CHEST - 1 VIEW  COMPARISON:  Portable chest x-ray dated Apr 10, 2014 at 5:52 a.m.  FINDINGS: The endotracheal tube tip lies approximately 1.7 cm above the crotch of the carina. The lungs are borderline hypoinflated but better inflated than on the earlier study today. The interstitial markings remain increased. The cardiopericardial silhouette is mildly enlarged. The esophagogastric tube tip in proximal port project below the inferior margin of the image.  IMPRESSION: The endotracheal tube tip lies approximately 1.7 cm above the crotch of the carina. Withdrawal by approximately 2 cc would help assure that there is no accidental mainstem bronchus intubation with patient movement.   Electronically Signed   By: David  Martinique   On: 04/10/2014 16:01   Portable Chest Xray In Am  04/10/2014   CLINICAL DATA:  Evaluate endotracheal tube  EXAM: PORTABLE CHEST - 1 VIEW  COMPARISON:  DG CHEST 1V PORT dated 04/09/2014; DG THORACIC SPINE 1 VIEW dated 04/08/2014; DG CHEST 1V PORT dated 03/23/2014; CT ANGIO CHEST W/CM &/OR WO/CM dated 03/23/2014  FINDINGS: Grossly unchanged borderline enlarged cardiac silhouette and mediastinal contours given persistently reduced lung volumes and kyphotic projection. There is persistent mild rightward deviation of the tracheal air Mild column at the level of thoracic inlet. Interval extubation and removal of enteric tube. Minimally improved aeration of the lungs with persistent findings of pulmonary venous congestion. Grossly unchanged small bilateral effusions and associated bibasilar opacities, left greater than right. Unchanged bones.  IMPRESSION: 1. Interval extubation  and removal of enteric tube. No pneumothorax. 2. Improved aeration of the lungs suggests resolving edema and/or atelectasis. 3. Grossly unchanged trace bilateral effusions and associated bibasilar opacities, atelectasis versus infiltrate. Further evaluation with a PA and lateral chest radiograph may be obtained as clinically indicated.   Electronically Signed   By: Sandi Mariscal M.D.   On: 04/10/2014 07:54   Dg Abd Portable 1v  04/10/2014   CLINICAL DATA:  OG tube placement.  EXAM: PORTABLE ABDOMEN - 1 VIEW  COMPARISON:  Lumbar spine radiograph 04/08/2014  FINDINGS: An enteric tube is present with tip in side hole well below the diaphragm and likely in the region of the distal gastric body. There is  a paucity of bowel gas. Lung bases appear better aerated than on recent portable chest radiograph.  IMPRESSION: Enteric tube tip likely in the distal gastric body.   Electronically Signed   By: Logan Bores   On: 04/10/2014 16:11   ASSESSMENT / PLAN:  PULMONARY A: Acute on chronic respiratory failure, likely exacerbated by opioids / sedatives  Intubated in setting if Neuro IR procedure COPD, no evidence of exacerbation Suspect OSA / OHS P:    Goal pH>7.30, SpO2>92 Continuous mechanical support VAP bundle Daily SBT Trend ABG/CXR Albuterol / Pulmicort Spiriva when able  CARDIOVASCULAR A:  Chronic diastolic heart failure Dyslipidemia  P:  Lipitor  RENAL A:   Hypokalemia Hypernatremia, improving P:   Trend BMP D5W@75   GASTROINTESTINAL A:   At risk for aspiration Nutrition GERD P:   NPO Protonix as preadmission Start TF  HEMATOLOGIC A:   Anemia, stable VTE Px P:  Trend CBC SCD  INFECTIOUS A:   MSSA bacteremia Allergy PCN / cephalosporins P:   Abx / cx as above  ENDOCRINE  A:   Normoglycemic On stress dose steroids, indication? P:   SSI Lantus 10  NEUROLOGIC A:   S/p spinal cord decompression Acute encephalopathy P:   Start Precedex D/c  Propofol Fentanyl PRN Preadmission Celexa  I have personally obtained history, examined patient, evaluated and interpreted laboratory and imaging results, reviewed medical records, formulated assessment / plan and placed orders.  CRITICAL CARE: The patient is critically ill with multiple organ systems failure and requires high complexity decision making for assessment and support, frequent evaluation and titration of therapies, application of advanced monitoring technologies and extensive interpretation of multiple databases. Critical Care Time devoted to patient care services described in this note is 35 minutes.   Doree Fudge, MD Pulmonary and [place] Pager: [phone]  04/11/2014, 6:02 PM

## 2014-04-12 DIAGNOSIS — E119 Type 2 diabetes mellitus without complications: Secondary | ICD-10-CM

## 2014-04-12 LAB — TYPE AND SCREEN
ABO/RH(D): A POS
Antibody Screen: NEGATIVE
UNIT DIVISION: 0
Unit division: 0
Unit division: 0
Unit division: 0

## 2014-04-12 LAB — BASIC METABOLIC PANEL
BUN: 20 mg/dL (ref 6–23)
CHLORIDE: 107 meq/L (ref 96–112)
CO2: 29 meq/L (ref 19–32)
CREATININE: 0.47 mg/dL — AB (ref 0.50–1.10)
Calcium: 8.9 mg/dL (ref 8.4–10.5)
GFR calc Af Amer: >90 mL/min (ref >90)
GFR calc non Af Amer: >90 mL/min (ref >90)
GLUCOSE: 187 mg/dL — AB (ref 70–99)
POTASSIUM: 4.6 meq/L (ref 3.7–5.3)
Sodium: 147 mEq/L (ref 137–147)

## 2014-04-12 LAB — GLUCOSE, CAPILLARY
GLUCOSE-CAPILLARY: 143 mg/dL — AB (ref 70–99)
GLUCOSE-CAPILLARY: 203 mg/dL — AB (ref 70–99)
GLUCOSE-CAPILLARY: 230 mg/dL — AB (ref 70–99)
GLUCOSE-CAPILLARY: 173 mg/dL — AB (ref 70–99)
Glucose-Capillary: 137 mg/dL — ABNORMAL HIGH (ref 70–99)
Glucose-Capillary: 192 mg/dL — ABNORMAL HIGH (ref 70–99)

## 2014-04-12 LAB — CBC
HCT: 33.3 % — ABNORMAL LOW (ref 36.0–46.0)
HEMOGLOBIN: 10.3 g/dL — AB (ref 12.0–15.0)
MCH: 26.9 pg (ref 26.0–34.0)
MCHC: 30.9 g/dL (ref 30.0–36.0)
MCV: 86.9 fL (ref 78.0–100.0)
Platelets: 230 10*3/uL (ref 150–400)
RBC: 3.83 MIL/uL — ABNORMAL LOW (ref 3.87–5.11)
RDW: 20.3 % — ABNORMAL HIGH (ref 11.5–15.5)
WBC: 7.2 10*3/uL (ref 4.0–10.5)

## 2014-04-12 MED ORDER — DEXMEDETOMIDINE HCL IN NACL 200 MCG/50ML IV SOLN: 0.4000 ug/kg/h | INTRAVENOUS | Status: DC

## 2014-04-12 MED ORDER — DEXMEDETOMIDINE HCL IN NACL 400 MCG/100ML IV SOLN
0.4000 ug/kg/h | INTRAVENOUS | Status: DC
  Administered 2014-04-12 – 2014-04-13 (×9): 1 ug/kg/h via INTRAVENOUS
  Administered 2014-04-14: 1.2 ug/kg/h via INTRAVENOUS
  Administered 2014-04-14: 1 ug/kg/h via INTRAVENOUS
  Administered 2014-04-14: 1.2 ug/kg/h via INTRAVENOUS
  Administered 2014-04-14: 1 ug/kg/h via INTRAVENOUS
  Administered 2014-04-14 – 2014-04-17 (×14): 1.2 ug/kg/h via INTRAVENOUS
  Filled 2014-04-12 (×28): qty 100

## 2014-04-12 MED ORDER — ALBUTEROL SULFATE (2.5 MG/3ML) 0.083% IN NEBU
2.5000 mg | INHALATION_SOLUTION | Freq: Two times a day (BID) | RESPIRATORY_TRACT | Status: DC
  Administered 2014-04-12: 2.5 mg via RESPIRATORY_TRACT
  Filled 2014-04-12: qty 3

## 2014-04-12 NOTE — Progress Notes (Signed)
No significant change in status. Remains intubated and ventilated for respiratory failure.  Afebrile. Vitals are stable. Patient will awaken. She follow simple commands with her upper extremities. Still no voluntary movement of both lower extremity is. Still with slight sensory function in both lower extremity is. Dressing clean and dry. Patient moderately tender with some incisional/surgical pain.  Overall stable from my standpoint. Respiratory failure certainly very difficult issues given her size and now near-complete immobility. Patient will likely require tracheostomy. No new recommendations from my standpoint.

## 2014-04-12 NOTE — Progress Notes (Signed)
PULMONARY / CRITICAL CARE MEDICINE  Name: Joanna Perez MRN: [id number] DOB: 1956-02-27    ADMISSION DATE:  04/07/2014 CONSULTATION DATE:  04/09/2014  REFERRING MD :  Earnie Larsson, MD PRIMARY SERVICE: Neurosurgery   CHIEF COMPLAINT:  Paraplegia   BRIEF PATIENT DESCRIPTION: 58 yo with COPD, CHF, DM, CAD s/p MI, atrial fibrillation transferred from [facility] for BLE paraplegia, found to have T6 fracture with spinal stenosis and spinal cord compression on MRI. She was taken for decompressive surgery and remained on mechanical ventilation post surgery. We are consulted for ventilator management.  SIGNIFICANT EVENTS / STUDIES:  5/16  Admitted to [facility], hypotensive and with new paraplegia 5/17  Transfer to [facility] 5/17  MR Cervical/Thoracic/Lumbar Spine - worsened T6 compression fracture with paraspinal and epidural hematoma causing cord compression and edema, possible early cord infarct 5/18  Intubation for procedure 5/18  Thoracic laminectomy 5/18  Extubate 5/19  Somnolent / hypercarbic >>> reintubated  LINES / TUBES: ETT 5/18 >>> 5/18; 5/19 >>> R R AL 5/17 >>> Foley 5/16 >>>  CULTURES: 5/16  Blood >>> GPC in clusters >>> STAPHYLOCOCCUS SPECIES (COAGULASE NEGATIVE) - Oxacillin sensetive 5/16  Urine culture >>> neg  ANTIBIOTICS:  Vancomycin 5/17 >>> Levaquin 5/17 >>> 5/19  INTERVAL HISTORY:  Failed SBT this morning ( tachypnea, tachycardia, respiratory distress )  VITAL SIGNS: Temp:  [98 F (36.7 C)-98.9 F (37.2 C)] 98.9 F (37.2 C) (05/21 0336) Pulse Rate:  [94-135] 127 (05/21 0900) Resp:  [19-32] 25 (05/21 0900) BP: (106-156)/(63-110) 141/81 mmHg (05/21 0900) SpO2:  [94 %-100 %] 94 % (05/21 0900) Arterial Line BP: (135-215)/(65-132) 159/77 mmHg (05/21 0900) FiO2 (%):  [40 %] 40 % (05/21 0900) Weight:  [95.2 kg (209 lb 14.1 oz)] 95.2 kg (209 lb 14.1 oz) (05/21 0500) HEMODYNAMICS:   VENTILATOR SETTINGS: Vent Mode:  [-] PRVC FiO2 (%):  [40 %] 40 % Set Rate:   [20 bmp] 20 bmp Vt Set:  [400 mL] 400 mL PEEP:  [5 cmH20] 5 cmH20 Pressure Support:  [15 cmH20] 15 cmH20 Plateau Pressure:  [16 cmH20-30 cmH20] 23 cmH20  INTAKE / OUTPUT: Intake/Output     05/20 0701 - 05/21 0700 05/21 0701 - 05/22 0700   I.V. (mL/kg) 1972.1 (20.7) 220.2 (2.3)   NG/GT 245.3    IV Piggyback 300    Total Intake(mL/kg) 2517.5 (26.4) 220.2 (2.3)   Urine (mL/kg/hr) 1350 (0.6)    Total Output 1350     Net +1167.5 +220.2        Stool Occurrence 1 x      PHYSICAL EXAMINATION: General:  Comfortable on full support  Neuro:  Sedated, gag / cough diminished HEENT:  OETT Cardiovascular:  Regular, no murmurs Lungs:  Bilateral diminished air entry, few wheezes / rhonchi Abdomen:  Soft, bowel sounds diminished Musculoskeletal:  No edema Skin:  No rash  LABS:  CBC  Recent Labs Lab 04/10/14 0355 04/11/14 1155 04/12/14 0500  WBC 9.2 8.9 7.2  HGB 9.7* 10.1* 10.3*  HCT 30.9* 32.3* 33.3*  PLT 254 226 230   Coag's No results found for this basename: APTT, INR,  in the last 168 hours BMET  Recent Labs Lab 04/10/14 0355 04/11/14 1155 04/12/14 0500  NA 152* 149* 147  K 3.6* 4.1 4.6  CL 112 111 107  CO2 30 31 29   BUN 21 25* 20  CREATININE 0.61 0.57 0.47*  GLUCOSE 103* 131* 187*   Electrolytes  Recent Labs Lab 04/10/14 0355 04/11/14 1155 04/12/14 0500  CALCIUM 8.8 9.1 8.9   Sepsis Markers  Recent Labs Lab 04/07/14 1200 04/07/14 1450 04/07/14 1459  LATICACIDVEN 1.7  --  0.9  PROCALCITON  --  1.51  --    ABG  Recent Labs Lab 04/09/14 1526 04/10/14 1635 04/11/14 0405  PHART 7.316* 7.247* 7.362  PCO2ART 54.4* 68.4* 57.2*  PO2ART 83.0 107.0* 93.6   Liver Enzymes  Recent Labs Lab 04/07/14 1202 04/08/14 0439  AST 68* 44*  ALT 43* 34  ALKPHOS 52 48  BILITOT 0.2* 0.2*  ALBUMIN 1.9* 1.8*   Cardiac Enzymes No results found for this basename: TROPONINI, PROBNP,  in the last 168 hours Glucose  Recent Labs Lab 04/11/14 1244  04/11/14 1613 04/11/14 2016 04/11/14 2325 04/12/14 0332 04/12/14 0841  GLUCAP 121* 150* 143* 137* 143* 203*   IMAGING:  Dg Chest Port 1 View  04/10/2014   CLINICAL DATA:  Assess endotracheal tube placement  EXAM: PORTABLE CHEST - 1 VIEW  COMPARISON:  Portable chest x-ray dated Apr 10, 2014 at 5:52 a.m.  FINDINGS: The endotracheal tube tip lies approximately 1.7 cm above the crotch of the carina. The lungs are borderline hypoinflated but better inflated than on the earlier study today. The interstitial markings remain increased. The cardiopericardial silhouette is mildly enlarged. The esophagogastric tube tip in proximal port project below the inferior margin of the image.  IMPRESSION: The endotracheal tube tip lies approximately 1.7 cm above the crotch of the carina. Withdrawal by approximately 2 cc would help assure that there is no accidental mainstem bronchus intubation with patient movement.   Electronically Signed   By: David  Martinique   On: 04/10/2014 16:01   Dg Abd Portable 1v  04/10/2014   CLINICAL DATA:  OG tube placement.  EXAM: PORTABLE ABDOMEN - 1 VIEW  COMPARISON:  Lumbar spine radiograph 04/08/2014  FINDINGS: An enteric tube is present with tip in side hole well below the diaphragm and likely in the region of the distal gastric body. There is a paucity of bowel gas. Lung bases appear better aerated than on recent portable chest radiograph.  IMPRESSION: Enteric tube tip likely in the distal gastric body.   Electronically Signed   By: Logan Bores   On: 04/10/2014 16:11   ASSESSMENT / PLAN:  PULMONARY A: Acute on chronic respiratory failure, likely exacerbated by opioids / sedatives and minimal respiratory reserve Intubated initially in setting if Neuro IR procedure COPD, no evidence of exacerbation Suspect OSA / OHS History of tracheostomy P:    Goal pH>7.30, SpO2>92 Continuous mechanical support VAP bundle Daily SBT Trend ABG/CXR Albuterol / Pulmicort Spiriva when  able Will likely require trach redo  CARDIOVASCULAR A:  Chronic diastolic heart failure Dyslipidemia  P:  Lipitor  RENAL A:   Hypokalemia Hypernatremia, improving P:   Trend BMP D5W@75   GASTROINTESTINAL A:   At risk for aspiration Nutrition GERD P:   NPO Protonix as preadmission TF  HEMATOLOGIC A:   Anemia, stable VTE Px P:  Trend CBC SCD  INFECTIOUS A:   MSSA bacteremia Allergy PCN / cephalosporins P:   Abx / cx as above  ENDOCRINE  A:   Normoglycemic On stress dose steroids, indication? Hypothyroidism P:   SSI Lantus 10 bid Synthroid  NEUROLOGIC A:   S/p spinal cord decompression Acute encephalopathy P:   Precedex Fentanyl PRN Preadmission Celexa  I have personally obtained history, examined patient, evaluated and interpreted laboratory and imaging results, reviewed medical records, formulated assessment / plan and placed orders.  CRITICAL CARE:  The patient is critically ill with multiple organ systems failure and requires high complexity decision making for assessment and support, frequent evaluation and titration of therapies, application of advanced monitoring technologies and extensive interpretation of multiple databases. Critical Care Time devoted to patient care services described in this note is 35 minutes.   Doree Fudge, MD Pulmonary and [place] Pager: [phone]  04/12/2014, 10:39 AM

## 2014-04-12 NOTE — Progress Notes (Signed)
04/12/2014 0815 placed pt back on full support due to increased WOB  Decreased HR/Sats. Pt is stable RT will continue to monitor.

## 2014-04-13 DIAGNOSIS — J441 Chronic obstructive pulmonary disease with (acute) exacerbation: Secondary | ICD-10-CM

## 2014-04-13 LAB — GLUCOSE, CAPILLARY
GLUCOSE-CAPILLARY: 141 mg/dL — AB (ref 70–99)
GLUCOSE-CAPILLARY: 208 mg/dL — AB (ref 70–99)
GLUCOSE-CAPILLARY: 232 mg/dL — AB (ref 70–99)
Glucose-Capillary: 193 mg/dL — ABNORMAL HIGH (ref 70–99)
Glucose-Capillary: 180 mg/dL — ABNORMAL HIGH (ref 70–99)
Glucose-Capillary: 220 mg/dL — ABNORMAL HIGH (ref 70–99)

## 2014-04-13 LAB — VANCOMYCIN, TROUGH: VANCOMYCIN TR: 11.1 ug/mL (ref 10.0–20.0)

## 2014-04-13 LAB — BASIC METABOLIC PANEL
BUN: 23 mg/dL (ref 6–23)
CO2: 30 mEq/L (ref 19–32)
Calcium: 8.6 mg/dL (ref 8.4–10.5)
Chloride: 107 mEq/L (ref 96–112)
Creatinine, Ser: 0.43 mg/dL — ABNORMAL LOW (ref 0.50–1.10)
GFR calc Af Amer: >90 mL/min (ref >90)
Glucose, Bld: 230 mg/dL — ABNORMAL HIGH (ref 70–99)
POTASSIUM: 4.5 meq/L (ref 3.7–5.3)
Sodium: 145 mEq/L (ref 137–147)

## 2014-04-13 LAB — CBC
HCT: 31.5 % — ABNORMAL LOW (ref 36.0–46.0)
Hemoglobin: 9.9 g/dL — ABNORMAL LOW (ref 12.0–15.0)
MCH: 26.8 pg (ref 26.0–34.0)
MCHC: 31.4 g/dL (ref 30.0–36.0)
MCV: 85.4 fL (ref 78.0–100.0)
PLATELETS: 247 10*3/uL (ref 150–400)
RBC: 3.69 MIL/uL — ABNORMAL LOW (ref 3.87–5.11)
RDW: 20.4 % — AB (ref 11.5–15.5)
WBC: 9.4 10*3/uL (ref 4.0–10.5)

## 2014-04-13 MED ORDER — FREE WATER
200.0000 mL | Freq: Three times a day (TID) | Status: DC
  Administered 2014-04-13 – 2014-04-14 (×4): 200 mL

## 2014-04-13 MED ORDER — PREDNISONE 20 MG PO TABS
40.0000 mg | ORAL_TABLET | Freq: Every day | ORAL | Status: DC
  Administered 2014-04-13 – 2014-04-15 (×3): 40 mg via ORAL
  Filled 2014-04-13 (×4): qty 2

## 2014-04-13 MED ORDER — FENTANYL CITRATE 0.05 MG/ML IJ SOLN
50.0000 ug | INTRAMUSCULAR | Status: DC | PRN
  Administered 2014-04-13: 100 ug via INTRAVENOUS
  Administered 2014-04-13 (×2): 50 ug via INTRAVENOUS
  Administered 2014-04-14: 100 ug via INTRAVENOUS
  Administered 2014-04-14: 50 ug via INTRAVENOUS
  Administered 2014-04-14: 100 ug via INTRAVENOUS
  Administered 2014-04-14: 50 ug via INTRAVENOUS
  Administered 2014-04-14 – 2014-04-17 (×12): 100 ug via INTRAVENOUS
  Administered 2014-04-17: 50 ug via INTRAVENOUS
  Administered 2014-04-17: 100 ug via INTRAVENOUS
  Administered 2014-04-17: 50 ug via INTRAVENOUS
  Administered 2014-04-18 – 2014-04-28 (×47): 100 ug via INTRAVENOUS
  Filled 2014-04-13 (×74): qty 2

## 2014-04-13 MED ORDER — PREDNISONE 5 MG/5ML PO SOLN: 40.0000 mg | Freq: Every day | ORAL | Status: DC

## 2014-04-13 MED ORDER — SODIUM CHLORIDE 0.9 % IJ SOLN
10.0000 mL | Freq: Two times a day (BID) | INTRAMUSCULAR | Status: DC
  Administered 2014-04-13 – 2014-04-14 (×2): 20 mL
  Administered 2014-04-14: 10 mL
  Administered 2014-04-15: 20 mL
  Administered 2014-04-15 – 2014-04-16 (×2): 10 mL
  Administered 2014-04-16: 20 mL
  Administered 2014-04-17 – 2014-04-19 (×3): 10 mL
  Administered 2014-04-19: 20 mL
  Administered 2014-04-20 (×2): 10 mL
  Administered 2014-04-21: 20 mL
  Administered 2014-04-21: 10 mL
  Administered 2014-04-22: 30 mL
  Administered 2014-04-22 – 2014-04-23 (×3): 10 mL
  Administered 2014-04-24: 30 mL
  Administered 2014-04-24: 10 mL
  Administered 2014-04-25: 30 mL
  Administered 2014-04-25: 10 mL
  Administered 2014-04-26: 20 mL
  Administered 2014-04-26: 10 mL
  Administered 2014-04-27 – 2014-04-28 (×3): 30 mL
  Administered 2014-04-28: 10 mL
  Administered 2014-04-29: 20 mL
  Administered 2014-04-29 – 2014-05-07 (×13): 10 mL
  Administered 2014-05-07: 30 mL
  Administered 2014-05-08 (×2): 10 mL
  Administered 2014-05-09: 20 mL
  Administered 2014-05-09 – 2014-05-14 (×10): 10 mL
  Administered 2014-05-14 – 2014-05-15 (×2): 30 mL
  Administered 2014-05-15: 20 mL
  Administered 2014-05-16: 30 mL
  Administered 2014-05-16: 20 mL
  Administered 2014-05-17: 30 mL

## 2014-04-13 MED ORDER — PANTOPRAZOLE SODIUM 40 MG PO PACK
40.0000 mg | PACK | Freq: Every day | ORAL | Status: DC
  Administered 2014-04-13 – 2014-05-17 (×34): 40 mg
  Filled 2014-04-13 (×36): qty 20

## 2014-04-13 MED ORDER — VANCOMYCIN HCL IN DEXTROSE 1-5 GM/200ML-% IV SOLN
1000.0000 mg | Freq: Two times a day (BID) | INTRAVENOUS | Status: DC
  Administered 2014-04-13 – 2014-04-22 (×18): 1000 mg via INTRAVENOUS
  Filled 2014-04-13 (×22): qty 200

## 2014-04-13 MED ORDER — PANTOPRAZOLE SODIUM 40 MG PO TBEC: 40.0000 mg | DELAYED_RELEASE_TABLET | Freq: Every day | ORAL | Status: DC

## 2014-04-13 MED ORDER — ACETAMINOPHEN 160 MG/5ML PO SOLN
650.0000 mg | ORAL | Status: DC | PRN
  Administered 2014-04-13 – 2014-05-14 (×17): 650 mg
  Filled 2014-04-13 (×17): qty 20.3

## 2014-04-13 MED ORDER — SODIUM CHLORIDE 0.9 % IJ SOLN
10.0000 mL | INTRAMUSCULAR | Status: DC | PRN
  Administered 2014-05-06: 10 mL

## 2014-04-13 MED ORDER — IPRATROPIUM-ALBUTEROL 0.5-2.5 (3) MG/3ML IN SOLN
3.0000 mL | RESPIRATORY_TRACT | Status: DC
  Administered 2014-04-13 – 2014-04-18 (×30): 3 mL via RESPIRATORY_TRACT
  Filled 2014-04-13 (×30): qty 3

## 2014-04-13 NOTE — Progress Notes (Signed)
PULMONARY / CRITICAL CARE MEDICINE  Name: Joanna Perez MRN: [id number] DOB: 01/22/1956    ADMISSION DATE:  04/07/2014 CONSULTATION DATE:  04/09/2014  REFERRING MD :  Earnie Larsson, MD PRIMARY SERVICE: Neurosurgery   CHIEF COMPLAINT:  Paraplegia   BRIEF PATIENT DESCRIPTION: 58 yo with COPD, CHF, DM, CAD s/p MI, atrial fibrillation transferred from [facility] for BLE paraplegia, found to have T6 fracture with spinal stenosis and spinal cord compression on MRI. She was taken for decompressive surgery and remained on mechanical ventilation post surgery. We are consulted for ventilator management.  SIGNIFICANT EVENTS / STUDIES:  5/16  Admitted to [facility], hypotensive and with new paraplegia 5/17  Transfer to [facility] 5/17  MR Cervical/Thoracic/Lumbar Spine - worsened T6 compression fracture with paraspinal and epidural hematoma causing cord compression and edema, possible early cord infarct 5/18  Intubation for procedure 5/18  Thoracic laminectomy 5/18  Extubate 5/19  Somnolent / hypercarbic >>> reintubated  LINES / TUBES: ETT 5/18 >>> 5/18; 5/19 >>> R R AL 5/17 >>> Foley 5/16 >>>  CULTURES: 5/16  Blood >>> GPC in clusters >>> STAPHYLOCOCCUS SPECIES (COAGULASE NEGATIVE) - Oxacillin sensetive 5/16  Urine culture >>> neg  ANTIBIOTICS:  Vancomycin 5/17 >>> Levaquin 5/17 >>> 5/19  INTERVAL HISTORY:  No acute events.  Complains of some pain.  VITAL SIGNS: Temp:  [98 F (36.7 C)-99.5 F (37.5 C)] 98.7 F (37.1 C) (05/22 0320) Pulse Rate:  [84-135] 94 (05/22 0321) Resp:  [15-31] 29 (05/22 0100) BP: (107-152)/(53-94) 113/53 mmHg (05/22 0321) SpO2:  [94 %-100 %] 99 % (05/22 0321) Arterial Line BP: (130-191)/(65-92) 157/66 mmHg (05/22 0100) FiO2 (%):  [40 %] 40 % (05/22 0321) Weight:  [95.6 kg (210 lb 12.2 oz)] 95.6 kg (210 lb 12.2 oz) (05/22 0500) HEMODYNAMICS:   VENTILATOR SETTINGS: Vent Mode:  [-] PRVC FiO2 (%):  [40 %] 40 % Set Rate:  [20 bmp] 20 bmp Vt Set:  [400 mL]  400 mL PEEP:  [5 cmH20] 5 cmH20 Pressure Support:  [15 cmH20] 15 cmH20 Plateau Pressure:  [11 cmH20-26 cmH20] 26 cmH20  INTAKE / OUTPUT: Intake/Output     05/21 0701 - 05/22 0700   I.V. (mL/kg) 1677.4 (17.5)   NG/GT 60   Total Intake(mL/kg) 1737.4 (18.2)   Urine (mL/kg/hr) 1375 (0.6)   Total Output 1375   Net +362.4         PHYSICAL EXAMINATION: General:  Work of breathing somewhat increased Neuro:  Awake, alery, gag / cough intact HEENT:  OETT Cardiovascular:  Regular, no murmurs Lungs:  Bilateral diminished air entry Abdomen:  Soft, bowel sounds diminished Musculoskeletal:  No edema Skin:  No rash  LABS:  CBC  Recent Labs Lab 04/11/14 1155 04/12/14 0500 04/13/14 0420  WBC 8.9 7.2 9.4  HGB 10.1* 10.3* 9.9*  HCT 32.3* 33.3* 31.5*  PLT 226 230 247   Coag's No results found for this basename: APTT, INR,  in the last 168 hours BMET  Recent Labs Lab 04/11/14 1155 04/12/14 0500 04/13/14 0420  NA 149* 147 145  K 4.1 4.6 4.5  CL 111 107 107  CO2 31 29 30   BUN 25* 20 23  CREATININE 0.57 0.47* 0.43*  GLUCOSE 131* 187* 230*   Electrolytes  Recent Labs Lab 04/11/14 1155 04/12/14 0500 04/13/14 0420  CALCIUM 9.1 8.9 8.6   Sepsis Markers  Recent Labs Lab 04/07/14 1200 04/07/14 1450 04/07/14 1459  LATICACIDVEN 1.7  --  0.9  PROCALCITON  --  1.51  --  ABG  Recent Labs Lab 04/09/14 1526 04/10/14 1635 04/11/14 0405  PHART 7.316* 7.247* 7.362  PCO2ART 54.4* 68.4* 57.2*  PO2ART 83.0 107.0* 93.6   Liver Enzymes  Recent Labs Lab 04/07/14 1202 04/08/14 0439  AST 68* 44*  ALT 43* 34  ALKPHOS 52 48  BILITOT 0.2* 0.2*  ALBUMIN 1.9* 1.8*   Cardiac Enzymes No results found for this basename: TROPONINI, PROBNP,  in the last 168 hours Glucose  Recent Labs Lab 04/12/14 0841 04/12/14 1237 04/12/14 1557 04/12/14 1946 04/12/14 2330 04/13/14 0318  GLUCAP 203* 230* 192* 173* 141* 193*   IMAGING: No results found.  ASSESSMENT /  PLAN:  PULMONARY A: Acute on chronic respiratory failure, likely exacerbated by opioids / sedatives and minimal respiratory reserve Intubated initially in setting if Neuro IR procedure COPD, exacerbation? Suspect OSA / OHS History of tracheostomy P:    Goal pH>7.30, SpO2>92 Continuous mechanical support VAP bundle Daily SBT Trend ABG/CXR DuoNeb / Albuterol / Pulmicort Add Prednisone 40 Spiriva when able Will likely require trach redo  CARDIOVASCULAR A:  Chronic diastolic heart failure Dyslipidemia  P:  Lipitor  RENAL A:   Hypokalemia Hypernatremia, improving P:   Trend BMP D/c D5W Add free water 200 q8h  GASTROINTESTINAL A:   Nutrition GERD P:   NPO Protonix as preadmission TF  HEMATOLOGIC A:   Anemia, stable VTE Px P:  Trend CBC SCD  INFECTIOUS A:   MSSA bacteremia Allergy PCN / cephalosporins P:   Abx / cx as above  ENDOCRINE  A:   Hyperglycemia Hypothyroidism P:   SSI Lantus 10 bid Synthroid  NEUROLOGIC A:   S/p spinal cord decompression Acute encephalopathy P:   Precedex Fentanyl PRN, increased to 50-100 Preadmission Celexa  I have personally obtained history, examined patient, evaluated and interpreted laboratory and imaging results, reviewed medical records, formulated assessment / plan and placed orders.  CRITICAL CARE:  The patient is critically ill with multiple organ systems failure and requires high complexity decision making for assessment and support, frequent evaluation and titration of therapies, application of advanced monitoring technologies and extensive interpretation of multiple databases. Critical Care Time devoted to patient care services described in this note is 35 minutes.   Doree Fudge, MD Pulmonary and [place] Pager: [phone]  04/13/2014, 6:52 AM

## 2014-04-13 NOTE — Progress Notes (Signed)
Peripherally Inserted Central Catheter/Midline Placement  The IV Nurse has discussed with the patient and/or persons authorized to consent for the patient, the purpose of this procedure and the potential benefits and risks involved with this procedure.  The benefits include less needle sticks, lab draws from the catheter and patient may be discharged home with the catheter.  Risks include, but not limited to, infection, bleeding, blood clot (thrombus formation), and puncture of an artery; nerve damage and irregular heat beat.  Alternatives to this procedure were also discussed.  PICC/Midline Placement Documentation  PICC / Midline Double Lumen 97/74/14 PICC Right Basilic 40 cm 0 cm (Active)  Indication for Insertion or Continuance of Line Prolonged intravenous therapies 04/13/2014 11:41 AM  Exposed Catheter (cm) 0 cm 04/13/2014 11:41 AM  Lumen #1 Status Flushed;Saline locked;Blood return noted 04/13/2014 11:41 AM  Lumen #2 Status Flushed;Saline locked;Blood return noted 04/13/2014 11:41 AM  Dressing Change Due 04/20/14 04/13/2014 11:41 AM       [facility] Payment 04/13/2014, 11:51 AM

## 2014-04-13 NOTE — Progress Notes (Signed)
NUTRITION FOLLOW-UP  DOCUMENTATION CODES Per approved criteria  -Obesity Unspecified   INTERVENTION:  Vital High Protein @ 50 ml/hr   Tube feeding regimen provides 1200 kcal (24 kcal/kg IBW), 105 grams of protein, and 1003 ml of H2O.    NUTRITION DIAGNOSIS: Inadequate oral intake related to inability to eat as evidenced by NPO status; ongoing.   Goal: Enteral nutrition to provide 60-70% of estimated calorie needs (22-25 kcals/kg ideal body weight) and 100% of estimated protein needs, based on ASPEN guidelines for permissive underfeeding in critically ill obese individuals; met.   Monitor:  TF initiation/tolerance, weight trend, labs   ASSESSMENT:  Pt admitted with altered mental status and COPD exacerbation. Pt is status post thoracic decompressive surgery for severe myelopathy with subacute paraplegia on 5/18. Per MD note prognosis for neurological recovery is very poor.   Patient is currently intubated on ventilator support MV: 12.4 L/min Temp (24hrs), Avg:99.2 F (37.3 C), Min:98 F (36.7 C), Max:101.3 F (38.5 C) Pt discussed during ICU rounds and with RN. Per MD notes, pt has hx of trach and will likely need trach this admission due to hx of COPD.   Height: Ht Readings from Last 1 Encounters:  04/09/14 $RemoveB'5\' 2"'XZMmiDos$  (1.575 m)    Weight: Wt Readings from Last 1 Encounters:  04/13/14 210 lb 12.2 oz (95.6 kg)  Admission weight 193 lb (87.9 kg) 5/16 BMI: 35.3   BMI:  Body mass index is 38.54 kg/(m^2).  Estimated Nutritional Needs: Kcal: 3419 Protein: >/= 100 grams Fluid: > 1.6 L/day  Skin: stage II pressure ulcer on sacrum  Diet Order: NPO   Intake/Output Summary (Last 24 hours) at 04/13/14 1112 Last data filed at 04/13/14 1000  Gross per 24 hour  Intake 2954.4 ml  Output   1375 ml  Net 1579.4 ml    Last BM: 5/20   Labs:   Recent Labs Lab 04/11/14 1155 04/12/14 0500 04/13/14 0420  NA 149* 147 145  K 4.1 4.6 4.5  CL 111 107 107  CO2 $Re'31 29 30   'ADp$ BUN 25* 20 23  CREATININE 0.57 0.47* 0.43*  CALCIUM 9.1 8.9 8.6  GLUCOSE 131* 187* 230*    CBG (last 3)   Recent Labs  04/12/14 2330 04/13/14 0318 04/13/14 0848  GLUCAP 141* 193* 208*    Scheduled Meds: . antiseptic oral rinse  15 mL Mouth Rinse QID  . atorvastatin  20 mg Oral q1800  . budesonide  0.25 mg Nebulization BID  . chlorhexidine  15 mL Mouth Rinse BID  . cholecalciferol  400 Units Oral Daily  . citalopram  20 mg Oral Daily  . feeding supplement (VITAL HIGH PROTEIN)  1,000 mL Per Tube Q24H  . fluticasone  1 spray Each Nare Daily  . free water  200 mL Per Tube 3 times per day  . guaiFENesin  600 mg Oral Daily  . insulin aspart  0-15 Units Subcutaneous 6 times per day  . insulin glargine  10 Units Subcutaneous BID  . ipratropium-albuterol  3 mL Nebulization Q4H  . levothyroxine  112 mcg Oral QAC breakfast  . nystatin  5 mL Oral QID  . pantoprazole sodium  40 mg Per Tube Daily  . predniSONE  40 mg Oral Q breakfast  . senna  1 tablet Oral BID  . sodium chloride  3 mL Intravenous Q12H  . vancomycin  1,000 mg Intravenous Q12H    Continuous Infusions: . sodium chloride    . dexmedetomidine 1 mcg/kg/hr (04/13/14 1000)  [place], [place], [place] Pager [phone] After Hours Pager

## 2014-04-13 NOTE — Progress Notes (Signed)
Inpatient Diabetes Program Recommendations  AACE/ADA: New Consensus Statement on Inpatient Glycemic Control (2013)  Target Ranges:  Prepandial:   less than 140 mg/dL      Peak postprandial:   less than 180 mg/dL (1-2 hours)      Critically ill patients:  140 - 180 mg/dL     Results for BREA, COLESON (MRN [id number]) as of 04/13/2014 13:07  Ref. Range 04/12/2014 23:30 04/13/2014 03:18 04/13/2014 08:48 04/13/2014 12:13  Glucose-Capillary Latest Range: 70-99 mg/dL 141 (H) 193 (H) 208 (H) 180 (H)     Note that patient is intubated and currently receiving Vital High Protein tube feeds at 50cc hour.  Tube feeds at goal rate.  Having occasional glucose elevations.   MD- If patient continues to have elevated glucose levels >200 mg/dl, please consider initiation of Novolog tube feed coverage- Novolog 3 units Q4 hours (hold if tube feeds held for any reason)   Will follow Wyn Quaker RN, MSN, CDE Diabetes Coordinator Inpatient Diabetes Program Team Pager: [phone] (8a-10p)

## 2014-04-13 NOTE — Progress Notes (Signed)
PT Cancellation Note  Patient Details Name: [name] MRN: [id number] DOB: 02-May-1956   Cancelled Treatment:    Reason Eval/Treat Not Completed: Medical issues which prohibited therapy, Pt on ventilator support at this time, will sign off.  please re-order PT services when medically appropriate.    Duncan Dull 04/13/2014, 2:07 PM Alben Deeds, [place] DPT  [phone]

## 2014-04-13 NOTE — Progress Notes (Signed)
ANTIBIOTIC CONSULT NOTE   Pharmacy Consult for Vancomycin  Indication: bacteremia  - MSSE --> allergy to pcn  Allergies  Allergen Reactions  . Amoxicillin Hives  . Bactrim [Sulfamethoxazole-Tmp Ds] Hives  . Erythromycin Hives  . Keflex [Cephalexin] Hives  . Penicillins Itching and Swelling    Sweating    Labs:  Recent Labs  04/11/14 1155 04/12/14 0500 04/13/14 0420  WBC 8.9 7.2 9.4  HGB 10.1* 10.3* 9.9*  PLT 226 230 247  CREATININE 0.57 0.47* 0.43*   Estimated Creatinine Clearance: 83.7 ml/min (by C-G formula based on Cr of 0.43).  Recent Labs  04/13/14 0420  [place] 11.1     Microbiology: Recent Results (from the past 720 hour(s))  URINE CULTURE     Status: None   Collection Time    04/07/14 11:50 AM      Result Value Ref Range Status   Specimen Description URINE, CATHETERIZED   Final   Special Requests NONE   Final   Culture  Setup Time     Final   Value: 04/08/2014 01:35     Performed at [place]     Final   Value: NO GROWTH     Performed at [facility]   Culture     Final   Value: NO GROWTH     Performed at [facility]   Report Status 04/09/2014 FINAL   Final  CULTURE, BLOOD (ROUTINE X 2)     Status: None   Collection Time    04/07/14 12:02 PM      Result Value Ref Range Status   Specimen Description BLOOD RIGHT ANTECUBITAL   Final   Special Requests BOTTLES DRAWN AEROBIC ONLY [facility]   Final   Culture  Setup Time     Final   Value: 04/08/2014 21:13     Performed at [facility]   Culture     Final   Value: STAPHYLOCOCCUS SPECIES (COAGULASE NEGATIVE)     Note: RIFAMPIN AND GENTAMICIN SHOULD NOT BE USED AS SINGLE DRUGS FOR TREATMENT OF STAPH INFECTIONS.     Note: Performed at [facility]     Performed at [facility]   Report Status 04/10/2014 FINAL   Final   Organism ID, Bacteria STAPHYLOCOCCUS SPECIES (COAGULASE NEGATIVE)   Final  CULTURE, BLOOD (ROUTINE X 2)     Status: None    Collection Time    04/07/14 12:02 PM      Result Value Ref Range Status   Specimen Description BLOOD RIGHT HAND   Final   Special Requests BOTTLES DRAWN AEROBIC AND ANAEROBIC 8CC   Final   Culture  Setup Time     Final   Value: 04/08/2014 21:16     Performed at [facility]   Culture     Final   Value: STAPHYLOCOCCUS SPECIES (COAGULASE NEGATIVE)     Note: SUSCEPTIBILITIES PERFORMED ON PREVIOUS CULTURE WITHIN THE LAST 5 DAYS.     Note: Performed at [facility] Gram Stain Report Called to,Read Back By and Verified With: STONE A 04/08/14 0811 BY THOMPSON S     Performed at [facility]   Report Status 04/10/2014 FINAL   Final  SURGICAL PCR SCREEN     Status: None   Collection Time    04/08/14  7:54 PM      Result Value Ref Range Status   MRSA, PCR NEGATIVE  NEGATIVE Final   Staphylococcus  aureus NEGATIVE  NEGATIVE Final   Comment:            The Xpert SA Assay (FDA     approved for NASAL specimens     in patients over 6 years of age),     is one component of     a comprehensive surveillance     program.  Test performance has     been validated by [employer] for patients greater     than or equal to 60 year old.     It is not intended     to diagnose infection nor to     guide or monitor treatment.   Assessment: 58 yof initally presented with AMS and weakness. Now with positive blood cultures.  Continues on vancomycin  Vanc 5/17>> Vancomycin trough level this AM = 11.1 (sub therapeutic)  5/16 Blood - GPC 2/2 --> MSSE  Goal of Therapy:  Vancomycin trough level 15-20 mcg/ml  Plan:  1. Increase vancomycin to 1 Gram iv Q 12 hours 2. F/u renal fxn, C&S, clinical status and trough at [facility]  ? Unknown prognosis -- resume apixaban?  Thank you. Anette Guarneri, PharmD [phone]  04/13/2014,8:41 AM

## 2014-04-13 NOTE — Progress Notes (Addendum)
No significant change in status. Patient remains intubated on ventilator. Making no significant progress with ventilatory weaning.  She awakens easily. Upper extremity strength continue to be intact. Still no lower extremity voluntary movement. Dressing clean and dry.  Overall stable. Continue supportive efforts. No new recommendations for my standpoint.

## 2014-04-14 DIAGNOSIS — J9600 Acute respiratory failure, unspecified whether with hypoxia or hypercapnia: Secondary | ICD-10-CM

## 2014-04-14 LAB — GLUCOSE, CAPILLARY
GLUCOSE-CAPILLARY: 195 mg/dL — AB (ref 70–99)
GLUCOSE-CAPILLARY: 223 mg/dL — AB (ref 70–99)
GLUCOSE-CAPILLARY: 271 mg/dL — AB (ref 70–99)
Glucose-Capillary: 203 mg/dL — ABNORMAL HIGH (ref 70–99)
Glucose-Capillary: 265 mg/dL — ABNORMAL HIGH (ref 70–99)
Glucose-Capillary: 297 mg/dL — ABNORMAL HIGH (ref 70–99)

## 2014-04-14 MED ORDER — FENTANYL CITRATE 0.05 MG/ML IJ SOLN
25.0000 ug | Freq: Once | INTRAMUSCULAR | Status: AC
  Administered 2014-04-14: 25 ug via INTRAVENOUS
  Filled 2014-04-14: qty 2

## 2014-04-14 MED ORDER — SODIUM CHLORIDE 0.9 % IV BOLUS (SEPSIS)
500.0000 mL | Freq: Once | INTRAVENOUS | Status: AC
  Administered 2014-04-14: 500 mL via INTRAVENOUS

## 2014-04-14 MED ORDER — INSULIN GLARGINE 100 UNIT/ML ~~LOC~~ SOLN
15.0000 [IU] | Freq: Two times a day (BID) | SUBCUTANEOUS | Status: DC
  Administered 2014-04-14 – 2014-04-25 (×21): 15 [IU] via SUBCUTANEOUS
  Filled 2014-04-14 (×23): qty 0.15

## 2014-04-14 MED ORDER — FREE WATER
150.0000 mL | Freq: Three times a day (TID) | Status: DC
  Administered 2014-04-14 – 2014-04-16 (×7): 150 mL

## 2014-04-14 NOTE — Progress Notes (Signed)
Patient ID: Joanna Perez, female   DOB: 1956-10-10, 58 y.o.   MRN: [id number] Subjective:  The patient is alert. She is tachypnic area and  Objective: Vital signs in last 24 hours: Temp:  [98.9 F (37.2 C)-101.8 F (38.8 C)] 99.6 F (37.6 C) (05/23 1233) Pulse Rate:  [77-136] 113 (05/23 1100) Resp:  [21-33] 21 (05/23 1100) BP: (83-144)/(43-77) 126/61 mmHg (05/23 1100) SpO2:  [93 %-100 %] 97 % (05/23 1156) FiO2 (%):  [40 %-100 %] 40 % (05/23 1157) Weight:  [95.6 kg (210 lb 12.2 oz)] 95.6 kg (210 lb 12.2 oz) (05/23 0500)  Intake/Output from previous day: 05/22 0701 - 05/23 0700 In: 2118 [I.V.:468; NG/GT:1250; IV Piggyback:400] Out: 2100 [Urine:2100] Intake/Output this shift: Total I/O In: 754.7 [I.V.:14.7; Other:40; NG/GT:200; IV Piggyback:500] Out: -   Physical exam the patient is alert. She is paraplegic.  Lab Results:  Recent Labs  04/12/14 0500 04/13/14 0420  WBC 7.2 9.4  HGB 10.3* 9.9*  HCT 33.3* 31.5*  PLT 230 247   BMET  Recent Labs  04/12/14 0500 04/13/14 0420  NA 147 145  K 4.6 4.5  CL 107 107  CO2 29 30  GLUCOSE 187* 230*  BUN 20 23  CREATININE 0.47* 0.43*  CALCIUM 8.9 8.6    Studies/Results: No results found.  Assessment/Plan: Paraplegic: The patient is not an appropriate surgical candidate.  LOS: 7 days     Ophelia Charter 04/14/2014, 12:52 PM

## 2014-04-14 NOTE — Progress Notes (Signed)
PULMONARY / CRITICAL CARE MEDICINE  Name: Joanna Perez MRN: [id number] DOB: 17-Mar-1956    ADMISSION DATE:  04/07/2014 CONSULTATION DATE:  04/09/2014  REFERRING MD :  Earnie Larsson, MD PRIMARY SERVICE: Neurosurgery   CHIEF COMPLAINT:  Paraplegia   BRIEF PATIENT DESCRIPTION: 58 yo with COPD, CHF, DM, CAD s/p MI, atrial fibrillation transferred from [facility] for BLE paraplegia, found to have T6 fracture with spinal stenosis and spinal cord compression on MRI. She was taken for decompressive surgery and remained on mechanical ventilation post surgery. We are consulted for ventilator management.  SIGNIFICANT EVENTS / STUDIES:  5/16  Admitted to [facility], hypotensive and with new paraplegia 5/17  Transfer to [facility] 5/17  MR Cervical/Thoracic/Lumbar Spine - worsened T6 compression fracture with paraspinal and epidural hematoma causing cord compression and edema, possible early cord infarct 5/18  Intubation for procedure 5/18  Thoracic laminectomy 5/18  Extubate 5/19  Somnolent / hypercarbic >>> reintubated  LINES / TUBES: ETT 5/18 >>> 5/18; 5/19 >>> R R AL 5/17 >>>out  Foley 5/16 >>>  CULTURES: 5/16  Blood >>>  2/2 STAPHYLOCOCCUS SPECIES (COAGULASE NEGATIVE) - Oxacillin sensetive 5/16  Urine culture >>> neg  ANTIBIOTICS:  Vancomycin 5/17 >>> Levaquin 5/17 >>> 5/19  INTERVAL HISTORY:  Hypotensive overnight.  Improved now.  Pt awake, c/o pain.   VITAL SIGNS: Temp:  [98.9 F (37.2 C)-101.8 F (38.8 C)] 99.1 F (37.3 C) (05/23 0835) Pulse Rate:  [77-136] 126 (05/23 0700) Resp:  [22-33] 26 (05/23 0700) BP: (89-144)/(43-83) 104/52 mmHg (05/23 0700) SpO2:  [94 %-100 %] 94 % (05/23 0835) Arterial Line BP: (178-180)/(78-79) 180/78 mmHg (05/22 1200) FiO2 (%):  [40 %-100 %] 40 % (05/23 0836) Weight:  [210 lb 12.2 oz (95.6 kg)] 210 lb 12.2 oz (95.6 kg) (05/23 0500) HEMODYNAMICS:   VENTILATOR SETTINGS: Vent Mode:  [-] PSV FiO2 (%):  [40 %-100 %] 40 % Set Rate:  [20 bmp] 20  bmp Vt Set:  [400 mL] 400 mL PEEP:  [5 cmH20] 5 cmH20 Pressure Support:  [18 cmH20] 18 cmH20 Plateau Pressure:  [19 cmH20-27 cmH20] 19 cmH20  INTAKE / OUTPUT: Intake/Output     05/22 0701 - 05/23 0700 05/23 0701 - 05/24 0700   I.V. (mL/kg) 468 (4.9)    NG/GT 1250    IV Piggyback 400    Total Intake(mL/kg) 2118 (22.2)    Urine (mL/kg/hr) 2100 (0.9)    Total Output 2100     Net +18            PHYSICAL EXAMINATION: General:  Chronically ill appearing female, uncomfortable appearing  Neuro:  Awake, alery, follows commands with BUE, no movement BLE HEENT:  OETT Cardiovascular:  Regular, no murmurs Lungs:  resps even non labored on PS 18/5, mild tachypnea at times, diminished  Abdomen:  Soft, bowel sounds diminished Musculoskeletal:  No edema Skin:  No rash  LABS:  CBC  Recent Labs Lab 04/11/14 1155 04/12/14 0500 04/13/14 0420  WBC 8.9 7.2 9.4  HGB 10.1* 10.3* 9.9*  HCT 32.3* 33.3* 31.5*  PLT 226 230 247   Coag's No results found for this basename: APTT, INR,  in the last 168 hours BMET  Recent Labs Lab 04/11/14 1155 04/12/14 0500 04/13/14 0420  NA 149* 147 145  K 4.1 4.6 4.5  CL 111 107 107  CO2 31 29 30   BUN 25* 20 23  CREATININE 0.57 0.47* 0.43*  GLUCOSE 131* 187* 230*   Electrolytes  Recent Labs Lab 04/11/14  1155 04/12/14 0500 04/13/14 0420  CALCIUM 9.1 8.9 8.6   Sepsis Markers  Recent Labs Lab 04/07/14 1200 04/07/14 1450 04/07/14 1459  LATICACIDVEN 1.7  --  0.9  PROCALCITON  --  1.51  --    ABG  Recent Labs Lab 04/09/14 1526 04/10/14 1635 04/11/14 0405  PHART 7.316* 7.247* 7.362  PCO2ART 54.4* 68.4* 57.2*  PO2ART 83.0 107.0* 93.6   Liver Enzymes  Recent Labs Lab 04/07/14 1202 04/08/14 0439  AST 68* 44*  ALT 43* 34  ALKPHOS 52 48  BILITOT 0.2* 0.2*  ALBUMIN 1.9* 1.8*   Cardiac Enzymes No results found for this basename: TROPONINI, PROBNP,  in the last 168 hours Glucose  Recent Labs Lab 04/13/14 1213  04/13/14 1550 04/13/14 2035 04/13/14 2332 04/14/14 0347 04/14/14 0809  GLUCAP 180* 220* 232* 195* 203* 223*   IMAGING: No results found.  ASSESSMENT / PLAN:  PULMONARY A: Acute on chronic respiratory failure Resp muscle weakness r/t cord compression/new T6 paraplegia  Hx COPD with ?AECOPD Suspect OSA / OHS History of tracheostomy P:    Daily SBT and PS wean as tol with mandatory qhs rest VAP bundle Trend CXR DuoNeb / Albuterol / Pulmicort Cont po steroids with taper  Will need redo trach   NEUROLOGIC A:   S/p spinal cord decompression - still no movement BLE  Acute encephalopathy P:   Precedex Fentanyl PRN Preadmission Celexa Per neurosurgery   CARDIOVASCULAR A:  Chronic diastolic heart failure Dyslipidemia  Hypotension - mild. CVP 16, good UOP, good mental status. ?autonomic dysreflexia P:  Lipitor Goal euvolemic  F/u CVP   RENAL A:   Hypokalemia Hypernatremia, improved  P:   Trend BMP Cont free water - decrease 5/23  GASTROINTESTINAL A:   Nutrition GERD P:   NPO Protonix as preadmission TF  HEMATOLOGIC A:   Anemia, mild, stable VTE Px P:  Trend CBC SCD  INFECTIOUS A:   Coag neg staph bacteremia Allergy PCN / cephalosporins P:   Abx / cx as above  ENDOCRINE  A:   Hyperglycemia Hypothyroidism P:   SSI Increase lantus 5/23 Synthroid   Nickolas Madrid, NP 04/14/2014  10:30 AM Pager: [phone] or [phone]   I have personally obtained history, examined patient, evaluated and interpreted laboratory and imaging results, reviewed medical records, formulated assessment / plan and placed orders.  CRITICAL CARE:  The patient is critically ill with multiple organ systems failure and requires high complexity decision making for assessment and support, frequent evaluation and titration of therapies, application of advanced monitoring technologies and extensive interpretation of multiple databases. Critical Care Time devoted  to patient care services described in this note is 35 minutes.   Kara Mead MD. Sh[place] Flood. [facility] Pager [phone] If no response call [phone]

## 2014-04-14 NOTE — Progress Notes (Signed)
Placed patient back on full support due to desaturation to 85%, tachypnea, and increased WOB.

## 2014-04-15 ENCOUNTER — Inpatient Hospital Stay (HOSPITAL_COMMUNITY): Payer: Medicaid Other

## 2014-04-15 DIAGNOSIS — R7881 Bacteremia: Secondary | ICD-10-CM

## 2014-04-15 LAB — BASIC METABOLIC PANEL
BUN: 29 mg/dL — ABNORMAL HIGH (ref 6–23)
CALCIUM: 8.7 mg/dL (ref 8.4–10.5)
CHLORIDE: 106 meq/L (ref 96–112)
CO2: 34 mEq/L — ABNORMAL HIGH (ref 19–32)
CREATININE: 0.46 mg/dL — AB (ref 0.50–1.10)
Glucose, Bld: 196 mg/dL — ABNORMAL HIGH (ref 70–99)
Potassium: 5.1 mEq/L (ref 3.7–5.3)
Sodium: 147 mEq/L (ref 137–147)

## 2014-04-15 LAB — CBC
HCT: 30 % — ABNORMAL LOW (ref 36.0–46.0)
Hemoglobin: 8.8 g/dL — ABNORMAL LOW (ref 12.0–15.0)
MCH: 26.2 pg (ref 26.0–34.0)
MCHC: 29.3 g/dL — AB (ref 30.0–36.0)
MCV: 89.3 fL (ref 78.0–100.0)
PLATELETS: 357 10*3/uL (ref 150–400)
RBC: 3.36 MIL/uL — ABNORMAL LOW (ref 3.87–5.11)
RDW: 21 % — AB (ref 11.5–15.5)
WBC: 7.4 10*3/uL (ref 4.0–10.5)

## 2014-04-15 LAB — GLUCOSE, CAPILLARY
GLUCOSE-CAPILLARY: 170 mg/dL — AB (ref 70–99)
GLUCOSE-CAPILLARY: 271 mg/dL — AB (ref 70–99)
Glucose-Capillary: 172 mg/dL — ABNORMAL HIGH (ref 70–99)
Glucose-Capillary: 179 mg/dL — ABNORMAL HIGH (ref 70–99)
Glucose-Capillary: 238 mg/dL — ABNORMAL HIGH (ref 70–99)
Glucose-Capillary: 249 mg/dL — ABNORMAL HIGH (ref 70–99)

## 2014-04-15 LAB — VANCOMYCIN, TROUGH: VANCOMYCIN TR: 15.1 ug/mL (ref 10.0–20.0)

## 2014-04-15 MED ORDER — PREDNISONE 20 MG PO TABS
30.0000 mg | ORAL_TABLET | Freq: Every day | ORAL | Status: DC
  Administered 2014-04-16 – 2014-04-17 (×2): 30 mg via ORAL
  Filled 2014-04-15 (×3): qty 1

## 2014-04-15 NOTE — Progress Notes (Signed)
ANTIBIOTIC CONSULT NOTE - FOLLOW UP  Pharmacy Consult for Vancomycin Indication: Bacteremia  Allergies  Allergen Reactions  . Amoxicillin Hives  . Bactrim [Sulfamethoxazole-Tmp Ds] Hives  . Erythromycin Hives  . Keflex [Cephalexin] Hives  . Penicillins Itching and Swelling    Sweating    Patient Measurements: Height: 5\' 2"  (157.5 cm) Weight: 212 lb 15.4 oz (96.6 kg) IBW/kg (Calculated) : 50.1 Adjusted Body Weight:    Vital Signs: Temp: 99.7 F (37.6 C) (05/24 1555) Temp src: Axillary (05/24 1555) BP: 128/62 mmHg (05/24 1300) Pulse Rate: 100 (05/24 1300) Intake/Output from previous day: 05/23 0701 - 05/24 0700 In: 3210.1 [I.V.:570.1; NG/GT:1620; IV Piggyback:900] Out: 2150 [Urine:2150] Intake/Output from this shift: Total I/O In: 752 [I.V.:252; NG/GT:500] Out: 1475 [Urine:1475]  Labs:  Recent Labs  04/13/14 0420 04/15/14 0400  WBC 9.4 7.4  HGB 9.9* 8.8*  PLT 247 357  CREATININE 0.43* 0.46*   Estimated Creatinine Clearance: 84.1 ml/min (by C-G formula based on Cr of 0.46).  Recent Labs  04/13/14 0420 04/15/14 1640  VANCOTROUGH 11.1 15.1     Microbiology: Recent Results (from the past 720 hour(s))  URINE CULTURE     Status: None   Collection Time    04/07/14 11:50 AM      Result Value Ref Range Status   Specimen Description URINE, CATHETERIZED   Final   Special Requests NONE   Final   Culture  Setup Time     Final   Value: 04/08/2014 01:35     Performed at [facility]     Final   Value: NO GROWTH     Performed at [facility]   Culture     Final   Value: NO GROWTH     Performed at [facility]   Report Status 04/09/2014 FINAL   Final  CULTURE, BLOOD (ROUTINE X 2)     Status: None   Collection Time    04/07/14 12:02 PM      Result Value Ref Range Status   Specimen Description BLOOD RIGHT ANTECUBITAL   Final   Special Requests BOTTLES DRAWN AEROBIC ONLY [facility]   Final   Culture  Setup Time     Final   Value: 04/08/2014 21:13     Performed at [facility]   Culture     Final   Value: STAPHYLOCOCCUS SPECIES (COAGULASE NEGATIVE)     Note: RIFAMPIN AND GENTAMICIN SHOULD NOT BE USED AS SINGLE DRUGS FOR TREATMENT OF STAPH INFECTIONS.     Note: Performed at [facility]     Performed at [facility]   Report Status 04/10/2014 FINAL   Final   Organism ID, Bacteria STAPHYLOCOCCUS SPECIES (COAGULASE NEGATIVE)   Final  CULTURE, BLOOD (ROUTINE X 2)     Status: None   Collection Time    04/07/14 12:02 PM      Result Value Ref Range Status   Specimen Description BLOOD RIGHT HAND   Final   Special Requests BOTTLES DRAWN AEROBIC AND ANAEROBIC 8CC   Final   Culture  Setup Time     Final   Value: 04/08/2014 21:16     Performed at [facility]   Culture     Final   Value: STAPHYLOCOCCUS SPECIES (COAGULASE NEGATIVE)     Note: SUSCEPTIBILITIES PERFORMED ON PREVIOUS CULTURE WITHIN THE LAST 5 DAYS.     Note: Performed at [facility] Gram Stain Report Called to,Read Back By  and Verified With: STONE A 04/08/14 0811 BY THOMPSON S     Performed at [facility]   Report Status 04/10/2014 FINAL   Final  SURGICAL PCR SCREEN     Status: None   Collection Time    04/08/14  7:54 PM      Result Value Ref Range Status   MRSA, PCR NEGATIVE  NEGATIVE Final   Staphylococcus aureus NEGATIVE  NEGATIVE Final   Comment:            The Xpert SA Assay (FDA     approved for NASAL specimens     in patients over 58 years of age),     is one component of     a comprehensive surveillance     program.  Test performance has     been validated by [employer] for patients greater     than or equal to 41 year old.     It is not intended     to diagnose infection nor to     guide or monitor treatment.    Anti-infectives   Start     Dose/Rate Route Frequency Ordered Stop   04/13/14 1730  vancomycin (VANCOCIN) IVPB 1000 mg/200 mL premix     1,000 mg 200 mL/hr over 60  Minutes Intravenous Every 12 hours 04/13/14 0847     04/10/14 1730  vancomycin (VANCOCIN) IVPB 750 mg/150 ml premix  Status:  Discontinued     750 mg 150 mL/hr over 60 Minutes Intravenous Every 12 hours 04/10/14 1112 04/10/14 1116   04/10/14 1730  vancomycin (VANCOCIN) IVPB 750 mg/150 ml premix  Status:  Discontinued     750 mg 150 mL/hr over 60 Minutes Intravenous Every 12 hours 04/10/14 1136 04/13/14 0847   04/08/14 2317  bacitracin 50,000 Units in sodium chloride irrigation 0.9 % 500 mL irrigation  Status:  Discontinued       As needed 04/08/14 2317 04/09/14 0049   04/08/14 1630  vancomycin (VANCOCIN) IVPB 750 mg/150 ml premix  Status:  Discontinued     750 mg 150 mL/hr over 60 Minutes Intravenous Every 12 hours 04/08/14 1538 04/10/14 1111   04/08/14 1000  levofloxacin (LEVAQUIN) tablet 500 mg  Status:  Discontinued     500 mg Oral Daily 04/07/14 1636 04/10/14 1116   04/07/14 1615  levofloxacin (LEVAQUIN) tablet 500 mg  Status:  Discontinued     500 mg Oral Daily 04/07/14 1611 04/07/14 1635      Assessment:  ID: Vanc D#8 for bacteremia - WBC 7.4, Scr stable, still with low grade fevers Vanc 5/17>> 5/22 VT = 11.1 -> increase to 1 G Q 12 5/24: VT= 15.1  5/16 Blood - GPC 2/2 (coag negative staph) - sensitive to Ancef, but allergic   Goal of Therapy:  Vancomycin trough level 15-20 mcg/ml  Plan:  Continue Vancomycin 1g IV q12h  Marce Schartz S. Alford Highland, PharmD, [facility] Clinical Staff Pharmacist Pager [phone]  Wayland Salinas 04/15/2014,5:40 PM

## 2014-04-15 NOTE — Progress Notes (Signed)
Subjective: Patient resting in bed, comfortable. Continues on sent via ETT. CCM continues to work on weaning trials.  Objective: Vital signs in last 24 hours: Filed Vitals:   04/15/14 0630 04/15/14 0700 04/15/14 0800 04/15/14 0828  BP: 133/61 132/60 130/60   Pulse: 96 96 96   Temp:   100.4 F (38 C)   TempSrc:   Axillary   Resp: 24 22 22    Height:      Weight:      SpO2:  98% 99% 99%    Intake/Output from previous day: 05/23 0701 - 05/24 0700 In: 3210.1 [I.V.:570.1; NG/GT:1620; IV Piggyback:900] Out: 2150 [Urine:2150] Intake/Output this shift:    Physical Exam:  No movement of lower extremities. Some sensation in lower extremities.  CBC  Recent Labs  04/13/14 0420 04/15/14 0400  WBC 9.4 7.4  HGB 9.9* 8.8*  HCT 31.5* 30.0*  PLT 247 357   BMET  Recent Labs  04/13/14 0420 04/15/14 0400  NA 145 147  K 4.5 5.1  CL 107 106  CO2 30 34*  GLUCOSE 230* 196*  BUN 23 29*  CREATININE 0.43* 0.46*  CALCIUM 8.6 8.7    Assessment/Plan: Stable from a neurosurgical perspective. Spoke with the patient's husband at the bedside, and answered his questions.   Hosie Spangle, MD 04/15/2014, 9:47 AM

## 2014-04-15 NOTE — Progress Notes (Signed)
Pt continues to have intermittent fevers with tmax tonight of 100.7 (axillary).  Prn acetaminophen given x 2.  MD Nelda Marseille) notified.  No orders given at this time.  Will continue to monitor.

## 2014-04-15 NOTE — Progress Notes (Signed)
PULMONARY / CRITICAL CARE MEDICINE  Name: Joanna Perez MRN: [id number] DOB: [birth year]    ADMISSION DATE:  04/07/2014 CONSULTATION DATE:  04/09/2014  REFERRING MD :  Earnie Larsson, MD PRIMARY SERVICE: Neurosurgery   CHIEF COMPLAINT:  Paraplegia   BRIEF PATIENT DESCRIPTION: 58 yo with COPD, CHF, DM, CAD s/p MI, atrial fibrillation transferred from [facility] for BLE paraplegia, found to have T6 fracture with spinal stenosis and spinal cord compression on MRI. She was taken for decompressive surgery and remained on mechanical ventilation post surgery. We are consulted for ventilator management.  SIGNIFICANT EVENTS / STUDIES:  5/16  Admitted to [facility], hypotensive and with new paraplegia 5/17  Transfer to [facility] 5/17  MR Cervical/Thoracic/Lumbar Spine - worsened T6 compression fracture with paraspinal and epidural hematoma causing cord compression and edema, possible early cord infarct 5/18  Intubation for procedure 5/18  Thoracic laminectomy 5/18  Extubate 5/19  Somnolent / hypercarbic >>> reintubated  LINES / TUBES: ETT 5/18 >>> 5/18; 5/19 >>> R R AL 5/17 >>>out  Foley 5/16 >>>  CULTURES: 5/16  Blood >>>  2/2 STAPHYLOCOCCUS SPECIES (COAGULASE NEGATIVE) - Oxacillin sensetive 5/16  Urine culture >>> neg  ANTIBIOTICS:  Vancomycin 5/17 >>> Levaquin 5/17 >>> 5/19  INTERVAL HISTORY:  No acute change. Denies CP Weaning on PS 15/5    VITAL SIGNS: Temp:  [99.6 F (37.6 C)-100.7 F (38.2 C)] 100.6 F (38.1 C) (05/24 1148) Pulse Rate:  [86-130] 96 (05/24 0800) Resp:  [17-36] 22 (05/24 0800) BP: (105-133)/(48-100) 130/60 mmHg (05/24 0800) SpO2:  [94 %-99 %] 99 % (05/24 0828) FiO2 (%):  [40 %] 40 % (05/24 0829) Weight:  [212 lb 15.4 oz (96.6 kg)] 212 lb 15.4 oz (96.6 kg) (05/24 0400) HEMODYNAMICS: CVP:  [6 mmHg] 6 mmHg VENTILATOR SETTINGS: Vent Mode:  [-] PSV FiO2 (%):  [40 %] 40 % Set Rate:  [20 bmp] 20 bmp Vt Set:  [400 mL] 400 mL PEEP:  [5 cmH20] 5  cmH20 Pressure Support:  [15 cmH20-18 cmH20] 15 cmH20 Plateau Pressure:  [19 cmH20-24 cmH20] 20 cmH20  INTAKE / OUTPUT: Intake/Output     05/23 0701 - 05/24 0700 05/24 0701 - 05/25 0700   I.V. (mL/kg) 570.1 (5.9)    Other 120    NG/GT 1620    IV Piggyback 900    Total Intake(mL/kg) 3210.1 (33.2)    Urine (mL/kg/hr) 2150 (0.9)    Total Output 2150     Net +1060.1            PHYSICAL EXAMINATION: General:  Chronically ill appearing female, uncomfortable appearing  Neuro:  Awake, alery, follows commands with BUE, no movement BLE HEENT:  OETT Cardiovascular:  Regular, no murmurs Lungs:  resps even non labored on full support, mild tachypnea at times, diminished  Abdomen:  Soft, bowel sounds diminished Musculoskeletal:  No edema Skin:  No rash  LABS:  CBC  Recent Labs Lab 04/12/14 0500 04/13/14 0420 04/15/14 0400  WBC 7.2 9.4 7.4  HGB 10.3* 9.9* 8.8*  HCT 33.3* 31.5* 30.0*  PLT 230 247 357   Coag's No results found for this basename: APTT, INR,  in the last 168 hours BMET  Recent Labs Lab 04/12/14 0500 04/13/14 0420 04/15/14 0400  NA 147 145 147  K 4.6 4.5 5.1  CL 107 107 106  CO2 29 30 34*  BUN 20 23 29*  CREATININE 0.47* 0.43* 0.46*  GLUCOSE 187* 230* 196*   Electrolytes  Recent Labs Lab 04/12/14  0500 04/13/14 0420 04/15/14 0400  CALCIUM 8.9 8.6 8.7   Sepsis Markers No results found for this basename: LATICACIDVEN, PROCALCITON, O2SATVEN,  in the last 168 hours ABG  Recent Labs Lab 04/09/14 1526 04/10/14 1635 04/11/14 0405  PHART 7.316* 7.247* 7.362  PCO2ART 54.4* 68.4* 57.2*  PO2ART 83.0 107.0* 93.6   Liver Enzymes No results found for this basename: AST, ALT, ALKPHOS, BILITOT, ALBUMIN,  in the last 168 hours Cardiac Enzymes No results found for this basename: TROPONINI, PROBNP,  in the last 168 hours Glucose  Recent Labs Lab 04/14/14 1230 04/14/14 1630 04/14/14 2100 04/14/14 2334 04/15/14 0436 04/15/14 0836  GLUCAP 265* 297*  271* 238* 172* 179*   IMAGING: Dg Chest Portable 1 View  04/15/2014   CLINICAL DATA:  Respiratory failure.  EXAM: PORTABLE CHEST - 1 VIEW  COMPARISON:  Apr 10, 2014.  FINDINGS: Stable cardiomediastinal silhouette. Endotracheal tube is in grossly good position with distal tip approximately 3 cm above the Carina. Nasogastric tube is seen entering the stomach. No pneumothorax or significant pleural effusions are noted. Interval placement of right-sided PICC line with tip in expected position of the SVC. Hypoinflation of the lungs is noted with probable minimal bilateral basilar subsegmental atelectasis.  IMPRESSION: Hyperinflation of the lungs with minimal bilateral basilar subsegmental atelectasis. Stable support apparatus.   Electronically Signed   By: Sabino Dick M.D.   On: 04/15/2014 09:55    ASSESSMENT / PLAN:  PULMONARY A: Acute on chronic respiratory failure Resp muscle weakness r/t cord compression/new T6 paraplegia  Hx COPD with ?AECOPD Suspect OSA / OHS History of tracheostomy P:    Daily SBT and PS wean as tol with mandatory qhs rest VAP bundle Trend CXR DuoNeb / Albuterol / Pulmicort Cont po steroids with taper  Will need redo trach -- will try to plan for tues, will d/w dr Titus Mould   NEUROLOGIC A:   S/p spinal cord decompression - still no movement BLE  Acute encephalopathy - improved  P:   Precedex - wean as able  Fentanyl PRN Preadmission Celexa Consider further enteral sedation  Per neurosurgery   CARDIOVASCULAR A:  Chronic diastolic heart failure Dyslipidemia  Hypotension - mild. CVP 16, good UOP, good mental status. ?autonomic dysreflexia P:  Lipitor Goal euvolemic  F/u CVP   RENAL A:   Hypokalemia Hypernatremia, improved  P:   Trend BMP Cont free water   GASTROINTESTINAL A:   Nutrition GERD P:   NPO Protonix as preadmission TF  HEMATOLOGIC A:   Anemia, mild, stable VTE Px P:  Trend CBC SCD  INFECTIOUS A:   Coag neg staph  bacteremia Allergy PCN / cephalosporins P:   Abx  as above, rpt resp cx Low grade fevers 5/24 - monitor - hold further abx for now, cont vanc alone   ENDOCRINE  A:   Hyperglycemia Hypothyroidism P:   SSI, lantus  Synthroid   Nickolas Madrid, NP 04/15/2014  11:53 AM Pager: [phone] or [phone]   I have personally obtained history, examined patient, evaluated and interpreted laboratory and imaging results, reviewed medical records, formulated assessment / plan and placed orders.  CRITICAL CARE:  The patient is critically ill with multiple organ systems failure and requires high complexity decision making for assessment and support, frequent evaluation and titration of therapies, application of advanced monitoring technologies and extensive interpretation of multiple databases. Critical Care Time devoted to patient care services described in this note is 31 minutes.    Kara Mead MD.  FCCP. [facility] Pager [phone] If no response call [phone]

## 2014-04-16 LAB — CBC
HCT: 28.5 % — ABNORMAL LOW (ref 36.0–46.0)
Hemoglobin: 8.8 g/dL — ABNORMAL LOW (ref 12.0–15.0)
MCH: 27.3 pg (ref 26.0–34.0)
MCHC: 30.9 g/dL (ref 30.0–36.0)
MCV: 88.5 fL (ref 78.0–100.0)
PLATELETS: 384 10*3/uL (ref 150–400)
RBC: 3.22 MIL/uL — ABNORMAL LOW (ref 3.87–5.11)
RDW: 21 % — AB (ref 11.5–15.5)
WBC: 8.3 10*3/uL (ref 4.0–10.5)

## 2014-04-16 LAB — GLUCOSE, CAPILLARY
GLUCOSE-CAPILLARY: 230 mg/dL — AB (ref 70–99)
GLUCOSE-CAPILLARY: 256 mg/dL — AB (ref 70–99)
GLUCOSE-CAPILLARY: 223 mg/dL — AB (ref 70–99)
Glucose-Capillary: 170 mg/dL — ABNORMAL HIGH (ref 70–99)
Glucose-Capillary: 187 mg/dL — ABNORMAL HIGH (ref 70–99)
Glucose-Capillary: 210 mg/dL — ABNORMAL HIGH (ref 70–99)

## 2014-04-16 LAB — BASIC METABOLIC PANEL
BUN: 29 mg/dL — ABNORMAL HIGH (ref 6–23)
CALCIUM: 8.9 mg/dL (ref 8.4–10.5)
CO2: 35 mEq/L — ABNORMAL HIGH (ref 19–32)
Chloride: 105 mEq/L (ref 96–112)
Creatinine, Ser: 0.44 mg/dL — ABNORMAL LOW (ref 0.50–1.10)
GFR calc Af Amer: >90 mL/min (ref >90)
GFR calc non Af Amer: >90 mL/min (ref >90)
GLUCOSE: 198 mg/dL — AB (ref 70–99)
Potassium: 4.9 mEq/L (ref 3.7–5.3)
SODIUM: 145 meq/L (ref 137–147)

## 2014-04-16 LAB — BLOOD GAS, ARTERIAL
ACID-BASE EXCESS: 11 mmol/L — AB (ref 0.0–2.0)
Bicarbonate: 37.2 mEq/L — ABNORMAL HIGH (ref 20.0–24.0)
Drawn by: 249101
FIO2: 0.4 %
Mode: POSITIVE
O2 SAT: 96 %
PCO2 ART: 74 mmHg — AB (ref 35.0–45.0)
PEEP: 5 cmH2O
PO2 ART: 78 mmHg — AB (ref 80.0–100.0)
Patient temperature: 98.6
Pressure support: 5 cmH2O
TCO2: 39.5 mmol/L (ref 0–100)
pH, Arterial: 7.322 — ABNORMAL LOW (ref 7.350–7.450)

## 2014-04-16 LAB — URINALYSIS, ROUTINE W REFLEX MICROSCOPIC
BILIRUBIN URINE: NEGATIVE
Glucose, UA: NEGATIVE mg/dL
Hgb urine dipstick: NEGATIVE
Ketones, ur: NEGATIVE mg/dL
NITRITE: NEGATIVE
Protein, ur: NEGATIVE mg/dL
SPECIFIC GRAVITY, URINE: 1.019 (ref 1.005–1.030)
Urobilinogen, UA: 0.2 mg/dL (ref 0.0–1.0)
pH: 5.5 (ref 5.0–8.0)

## 2014-04-16 LAB — URINE MICROSCOPIC-ADD ON

## 2014-04-16 MED ORDER — FENTANYL CITRATE 0.05 MG/ML IJ SOLN: 200.0000 ug | Freq: Once | INTRAMUSCULAR | Status: DC

## 2014-04-16 MED ORDER — VECURONIUM BROMIDE 10 MG IV SOLR
10.0000 mg | Freq: Once | INTRAVENOUS | Status: DC
  Filled 2014-04-16: qty 10

## 2014-04-16 MED ORDER — ALPRAZOLAM 0.5 MG PO TABS
1.0000 mg | ORAL_TABLET | Freq: Three times a day (TID) | ORAL | Status: DC
  Administered 2014-04-16 – 2014-05-12 (×76): 1 mg via ORAL
  Filled 2014-04-16 (×10): qty 2
  Filled 2014-04-16: qty 4
  Filled 2014-04-16 (×65): qty 2

## 2014-04-16 MED ORDER — MIDAZOLAM HCL 2 MG/2ML IJ SOLN
1.0000 mg | INTRAMUSCULAR | Status: DC | PRN
  Filled 2014-04-16 (×4): qty 2

## 2014-04-16 MED ORDER — BUDESONIDE 0.25 MG/2ML IN SUSP
0.5000 mg | Freq: Two times a day (BID) | RESPIRATORY_TRACT | Status: DC
  Filled 2014-04-16 (×2): qty 4

## 2014-04-16 MED ORDER — FREE WATER
200.0000 mL | Freq: Four times a day (QID) | Status: DC
  Administered 2014-04-16 (×2): 200 mL

## 2014-04-16 MED ORDER — BUDESONIDE 0.5 MG/2ML IN SUSP
0.5000 mg | Freq: Two times a day (BID) | RESPIRATORY_TRACT | Status: DC
  Administered 2014-04-16 – 2014-05-18 (×61): 0.5 mg via RESPIRATORY_TRACT
  Filled 2014-04-16 (×69): qty 2

## 2014-04-16 MED ORDER — ETOMIDATE 2 MG/ML IV SOLN: 40.0000 mg | Freq: Once | INTRAVENOUS | Status: DC

## 2014-04-16 NOTE — Progress Notes (Signed)
PULMONARY / CRITICAL CARE MEDICINE  Name: QUINESHA SELINGER MRN: [id number] DOB: 1956-04-22    ADMISSION DATE:  04/07/2014 CONSULTATION DATE:  04/09/2014  REFERRING MD :  Earnie Larsson, MD PRIMARY SERVICE: Neurosurgery   CHIEF COMPLAINT:  Paraplegia   BRIEF PATIENT DESCRIPTION: 58 yo with COPD, CHF, DM, CAD s/p MI, atrial fibrillation transferred from [facility] for BLE paraplegia, found to have T6 fracture with spinal stenosis and spinal cord compression on MRI. She was taken for decompressive surgery and remained on mechanical ventilation post surgery. We are consulted for ventilator management. Note 7 admits over last 84mnths  SIGNIFICANT EVENTS / STUDIES:  5/16  Admitted to [facility], hypotensive and with new paraplegia 5/17  Transfer to [facility] 5/17  MR Cervical/Thoracic/Lumbar Spine - worsened T6 compression fracture with paraspinal and epidural hematoma causing cord compression and edema, possible early cord infarct 5/18  Intubation for procedure 5/18  Thoracic laminectomy 5/18  Extubate 5/19  Somnolent / hypercarbic >>> reintubated  LINES / TUBES: ETT 5/18 >>> 5/18; 5/19 >>> R R AL 5/17 >>>out  Foley 5/16 >>>  CULTURES: 5/16  Blood >>>  2/2 STAPHYLOCOCCUS SPECIES (COAGULASE NEGATIVE) - Oxacillin sensitive (allergy) 5/16  Urine culture >>> neg 5/25 urine>>>  5/25 sputum>>>  ANTIBIOTICS:  Vancomycin 5/17 >>> Levaquin 5/17 >>> 5/19  INTERVAL HISTORY:  Agitated, anxious.   Weans on PS  VITAL SIGNS: Temp:  [98.9 F (37.2 C)-100.6 F (38.1 C)] 100.6 F (38.1 C) (05/25 0700) Pulse Rate:  [73-103] 84 (05/25 0700) Resp:  [15-25] 21 (05/25 0700) BP: (101-145)/(53-73) 141/67 mmHg (05/25 0700) SpO2:  [92 %-100 %] 92 % (05/25 0801) FiO2 (%):  [30 %-40 %] 40 % (05/25 0801) Weight:  [212 lb 15.4 oz (96.6 kg)] 212 lb 15.4 oz (96.6 kg) (05/25 0030) HEMODYNAMICS: CVP:  [6 mmHg-15 mmHg] 15 mmHg VENTILATOR SETTINGS: Vent Mode:  [-] PRVC FiO2 (%):  [30 %-40 %] 40 % Set Rate:   [20 bmp] 20 bmp Vt Set:  [400 mL] 400 mL PEEP:  [5 cmH20] 5 cmH20 Plateau Pressure:  [14 cmH20-30 cmH20] 30 cmH20  INTAKE / OUTPUT: Intake/Output     05/24 0701 - 05/25 0700 05/25 0701 - 05/26 0700   I.V. (mL/kg) 664 (6.9)    Other     NG/GT 1710    IV Piggyback 400    Total Intake(mL/kg) 2774 (28.7)    Urine (mL/kg/hr) 2625 (1.1) 400 (1.6)   Total Output 2625 400   Net +149 -400          PHYSICAL EXAMINATION: General:  Chronically ill appearing female, uncomfortable appearing  Neuro:  Awake, alery, follows commands with BUE, no movement BLE, anxious, intermittent agitation  HEENT:  OETT Cardiovascular:  Regular, no murmurs Lungs:  resps even non labored on full support, mild tachypnea at times, diminished  Abdomen:  Soft, bowel sounds diminished Musculoskeletal:  No edema Skin:  No rash  LABS:  CBC  Recent Labs Lab 04/13/14 0420 04/15/14 0400 04/16/14 0330  WBC 9.4 7.4 8.3  HGB 9.9* 8.8* 8.8*  HCT 31.5* 30.0* 28.5*  PLT 247 357 384   Coag's No results found for this basename: APTT, INR,  in the last 168 hours BMET  Recent Labs Lab 04/13/14 0420 04/15/14 0400 04/16/14 0330  NA 145 147 145  K 4.5 5.1 4.9  CL 107 106 105  CO2 30 34* 35*  BUN 23 29* 29*  CREATININE 0.43* 0.46* 0.44*  GLUCOSE 230* 196* 198*  Electrolytes  Recent Labs Lab 04/13/14 0420 04/15/14 0400 04/16/14 0330  CALCIUM 8.6 8.7 8.9   Sepsis Markers No results found for this basename: LATICACIDVEN, PROCALCITON, O2SATVEN,  in the last 168 hours ABG  Recent Labs Lab 04/09/14 1526 04/10/14 1635 04/11/14 0405  PHART 7.316* 7.247* 7.362  PCO2ART 54.4* 68.4* 57.2*  PO2ART 83.0 107.0* 93.6   Liver Enzymes No results found for this basename: AST, ALT, ALKPHOS, BILITOT, ALBUMIN,  in the last 168 hours Cardiac Enzymes No results found for this basename: TROPONINI, PROBNP,  in the last 168 hours Glucose  Recent Labs Lab 04/15/14 1145 04/15/14 1553 04/15/14 1945  04/15/14 2353 04/16/14 0340 04/16/14 0724  GLUCAP 170* 249* 271* 210* 170* 187*   IMAGING: Dg Chest Portable 1 View  04/15/2014   CLINICAL DATA:  Respiratory failure.  EXAM: PORTABLE CHEST - 1 VIEW  COMPARISON:  Apr 10, 2014.  FINDINGS: Stable cardiomediastinal silhouette. Endotracheal tube is in grossly good position with distal tip approximately 3 cm above the Carina. Nasogastric tube is seen entering the stomach. No pneumothorax or significant pleural effusions are noted. Interval placement of right-sided PICC line with tip in expected position of the SVC. Hypoinflation of the lungs is noted with probable minimal bilateral basilar subsegmental atelectasis.  IMPRESSION: Hyperinflation of the lungs with minimal bilateral basilar subsegmental atelectasis. Stable support apparatus.   Electronically Signed   By: Sabino Dick M.D.   On: 04/15/2014 09:55    ASSESSMENT / PLAN:  PULMONARY A: Acute on chronic respiratory failure Resp muscle weakness r/t cord compression/new T6 paraplegia  Hx COPD with ?AECOPD Suspect OSA / OHS History of tracheostomy P:    Daily SBT and PS wean as tol with mandatory qhs rest -would like to see a good PS/CPAP trial of 5/5 with ABG VAP bundle Trend CXR DuoNeb / Albuterol / Pulmicort Cont po steroids with taper  Will need redo trach -- will tentatively plan for tues, may be Wednesday, will d/w dr Titus Mould  NEUROLOGIC A:   S/p spinal cord decompression - still no movement BLE  Acute encephalopathy, agitation  P:   Precedex - wean as able  Fentanyl PRN Preadmission Celexa Will add low dose xanax (home med) Taper steroids  Neurosurgery following   CARDIOVASCULAR A:  Chronic diastolic heart failure Dyslipidemia  Hypotension - Resolved. CVP 16, good UOP, good mental status. ?autonomic dysreflexia P:  Lipitor Goal euvolemic  F/u CVP   RENAL A:   Hypokalemia Hypernatremia, improved  P:   Trend BMP Cont free water   GASTROINTESTINAL A:    Nutrition GERD P:   NPO Protonix as preadmission TF  HEMATOLOGIC A:   Anemia, mild, stable VTE Px P:  Trend CBC SCD  INFECTIOUS A:   Coag neg staph bacteremia Allergy PCN / cephalosporins P:   Abx  as above Repeat urine, sputum culture 5/25 Low grade fevers - reculture as above, hold further abx for now, cont vanc alone   ENDOCRINE  A:   Hyperglycemia Hypothyroidism P:   SSI, lantus  Synthroid   Nickolas Madrid, NP 04/16/2014  9:40 AM Pager: [phone] or [phone]   I have personally obtained history, examined patient, evaluated and interpreted laboratory and imaging results, reviewed medical records, formulated assessment / plan and placed orders.  CRITICAL CARE:  The patient is critically ill with multiple organ systems failure and requires high complexity decision making for assessment and support, frequent evaluation and titration of therapies, application of advanced monitoring technologies and extensive  interpretation of multiple databases. Critical Care Time devoted to patient care services described in this note is 32 minutes.   Kara Mead MD. [place] Flood. [place] [facility] Pager [phone] If no response call [phone]

## 2014-04-16 NOTE — Progress Notes (Signed)
Subjective: Patient resting in bed, continues on ventilator via ETT. Patient's nurse explains that CCM is going to proceed with tracheostomy tomorrow.  Objective: Vital signs in last 24 hours: Filed Vitals:   04/16/14 0600 04/16/14 0630 04/16/14 0700 04/16/14 0801  BP: 125/56 131/66 141/67 140/77  Pulse: 73 73 84 124  Temp:   100.6 F (38.1 C)   TempSrc:   Oral   Resp: 21 20 21 24   Height:      Weight:      SpO2: 95%  96% 92%    Intake/Output from previous day: 05/24 0701 - 05/25 0700 In: 2774 [I.V.:664; NG/GT:1710; IV Piggyback:400] Out: 2625 [Urine:2625] Intake/Output this shift: Total I/O In: -  Out: 400 [Urine:400]  Physical Exam:  Awake alert, following commands. No movement of the lower extremities.  CBC  Recent Labs  04/15/14 0400 04/16/14 0330  WBC 7.4 8.3  HGB 8.8* 8.8*  HCT 30.0* 28.5*  PLT 357 384   BMET  Recent Labs  04/15/14 0400 04/16/14 0330  NA 147 145  K 5.1 4.9  CL 106 105  CO2 34* 35*  GLUCOSE 196* 198*  BUN 29* 29*  CREATININE 0.46* 0.44*  CALCIUM 8.7 8.9    Studies/Results: Dg Chest Portable 1 View  04/15/2014   CLINICAL DATA:  Respiratory failure.  EXAM: PORTABLE CHEST - 1 VIEW  COMPARISON:  Apr 10, 2014.  FINDINGS: Stable cardiomediastinal silhouette. Endotracheal tube is in grossly good position with distal tip approximately 3 cm above the Carina. Nasogastric tube is seen entering the stomach. No pneumothorax or significant pleural effusions are noted. Interval placement of right-sided PICC line with tip in expected position of the SVC. Hypoinflation of the lungs is noted with probable minimal bilateral basilar subsegmental atelectasis.  IMPRESSION: Hyperinflation of the lungs with minimal bilateral basilar subsegmental atelectasis. Stable support apparatus.   Electronically Signed   By: Sabino Dick M.D.   On: 04/15/2014 09:55    Assessment/Plan: Stable from a neurosurgical perspective. Spoke with the patient's husband and other  family at the bedside.   Hosie Spangle, MD 04/16/2014, 10:28 AM

## 2014-04-16 NOTE — Progress Notes (Signed)
Inpatient Diabetes Program Recommendations  AACE/ADA: New Consensus Statement on Inpatient Glycemic Control (2013)  Target Ranges:  Prepandial:   less than 140 mg/dL      Peak postprandial:   less than 180 mg/dL (1-2 hours)      Critically ill patients:  140 - 180 mg/dL   Inpatient Diabetes Program Recommendations Insulin - Meal Coverage: Tube feed coverage. Pt receiving Vital 1.5 at 50 ml/hr. This give the pt 37 grams of carbohydrate every 4 hrs. Please cover this intake with 3-4 units novolog q 4 hrs to be given with correction novolog at the same time.  Thank you, Rosita Kea, RN, CNS, Diabetes Coordinator [phone])

## 2014-04-16 NOTE — Progress Notes (Signed)
OT Cancellation Note  Patient Details Name: Joanna Perez MRN: [id number] DOB: Apr 01, 1956   Cancelled Treatment:    Reason Eval/Treat Not Completed: Medical issues which prohibited therapy;Patient's level of consciousness. Will assess appropriateness tomorrow.   [place], [place]  [phone] 04/16/2014 04/16/2014, 3:06 PM

## 2014-04-17 ENCOUNTER — Inpatient Hospital Stay (HOSPITAL_COMMUNITY): Payer: Medicaid Other

## 2014-04-17 ENCOUNTER — Encounter (HOSPITAL_COMMUNITY): Payer: Self-pay | Admitting: Internal Medicine

## 2014-04-17 HISTORY — PX: TRACHEOSTOMY: SUR1362

## 2014-04-17 LAB — GLUCOSE, CAPILLARY
GLUCOSE-CAPILLARY: 161 mg/dL — AB (ref 70–99)
GLUCOSE-CAPILLARY: 107 mg/dL — AB (ref 70–99)
Glucose-Capillary: 107 mg/dL — ABNORMAL HIGH (ref 70–99)
Glucose-Capillary: 150 mg/dL — ABNORMAL HIGH (ref 70–99)
Glucose-Capillary: 212 mg/dL — ABNORMAL HIGH (ref 70–99)
Glucose-Capillary: 96 mg/dL (ref 70–99)

## 2014-04-17 LAB — BASIC METABOLIC PANEL
BUN: 24 mg/dL — ABNORMAL HIGH (ref 6–23)
CALCIUM: 9.4 mg/dL (ref 8.4–10.5)
CHLORIDE: 103 meq/L (ref 96–112)
CO2: 37 meq/L — AB (ref 19–32)
Creatinine, Ser: 0.38 mg/dL — ABNORMAL LOW (ref 0.50–1.10)
GFR calc Af Amer: >90 mL/min (ref >90)
GFR calc non Af Amer: >90 mL/min (ref >90)
Glucose, Bld: 175 mg/dL — ABNORMAL HIGH (ref 70–99)
POTASSIUM: 4.7 meq/L (ref 3.7–5.3)
SODIUM: 146 meq/L (ref 137–147)

## 2014-04-17 LAB — CBC
HCT: 29.6 % — ABNORMAL LOW (ref 36.0–46.0)
Hemoglobin: 8.9 g/dL — ABNORMAL LOW (ref 12.0–15.0)
MCH: 26.8 pg (ref 26.0–34.0)
MCHC: 30.1 g/dL (ref 30.0–36.0)
MCV: 89.2 fL (ref 78.0–100.0)
Platelets: 438 10*3/uL — ABNORMAL HIGH (ref 150–400)
RBC: 3.32 MIL/uL — AB (ref 3.87–5.11)
RDW: 20.9 % — ABNORMAL HIGH (ref 11.5–15.5)
WBC: 8.4 10*3/uL (ref 4.0–10.5)

## 2014-04-17 LAB — PROTIME-INR
INR: 1.11 (ref 0.00–1.49)
PROTHROMBIN TIME: 14.1 s (ref 11.6–15.2)

## 2014-04-17 MED ORDER — PROPOFOL BOLUS VIA INFUSION: 50.0000 mg | Freq: Once | INTRAVENOUS | Status: DC

## 2014-04-17 MED ORDER — VECURONIUM BROMIDE 10 MG IV SOLR
7.0000 mg | Freq: Once | INTRAVENOUS | Status: AC
  Administered 2014-04-17: 7 mg via INTRAVENOUS
  Filled 2014-04-17: qty 10

## 2014-04-17 MED ORDER — FREE WATER
300.0000 mL | Freq: Four times a day (QID) | Status: DC
  Administered 2014-04-17 – 2014-04-18 (×4): 300 mL

## 2014-04-17 MED ORDER — PROPOFOL 10 MG/ML IV BOLUS
50.0000 mg | Freq: Once | INTRAVENOUS | Status: AC
  Administered 2014-04-17: 50 mg via INTRAVENOUS
  Filled 2014-04-17: qty 20

## 2014-04-17 MED ORDER — FENTANYL CITRATE 0.05 MG/ML IJ SOLN
300.0000 ug | Freq: Once | INTRAMUSCULAR | Status: AC
  Administered 2014-04-17: 300 ug via INTRAVENOUS

## 2014-04-17 MED ORDER — FUROSEMIDE 10 MG/ML IJ SOLN
20.0000 mg | Freq: Two times a day (BID) | INTRAMUSCULAR | Status: DC
  Administered 2014-04-17 – 2014-04-18 (×3): 20 mg via INTRAVENOUS
  Filled 2014-04-17 (×4): qty 2

## 2014-04-17 MED ORDER — PREDNISONE 20 MG PO TABS
20.0000 mg | ORAL_TABLET | Freq: Every day | ORAL | Status: DC
  Administered 2014-04-18: 20 mg via ORAL
  Filled 2014-04-17 (×2): qty 1

## 2014-04-17 MED ORDER — ETOMIDATE 2 MG/ML IV SOLN
20.0000 mg | Freq: Once | INTRAVENOUS | Status: AC
  Administered 2014-04-17: 20 mg via INTRAVENOUS

## 2014-04-17 MED ORDER — MIDAZOLAM HCL 2 MG/2ML IJ SOLN
4.0000 mg | Freq: Once | INTRAMUSCULAR | Status: AC
  Administered 2014-04-17: 4 mg via INTRAVENOUS

## 2014-04-17 NOTE — Procedures (Signed)
Perc trach  Re do (2012) See full dictation Tolerated well 6 shiley placed Blood loss less 10 cc  Lavon Paganini. Titus Mould, MD, [place] Pgr: [place] [facility]

## 2014-04-17 NOTE — Progress Notes (Signed)
Pt complains of diffuse abdominal pain and guarding despite 34mcg of fentanyl PRN given. Patient denies nausea or need to have a bowel movement. Dr. Nelda Marseille notified of patient complaint; no new orders received. Will continue to monitor.

## 2014-04-17 NOTE — Progress Notes (Addendum)
OT Cancellation Note  Patient Details Name: Joanna Perez MRN: [id number] DOB: 01-25-1956   Cancelled Treatment:    Reason Eval/Treat Not Completed: Medical issues which prohibited therapy;Patient at procedure or test/ unavailable - will reattempt tomorrow.   MD - Please re-order PT when appropriate.   Thanks!  [place], OTR/L [phone] 04/17/2014, 2:30 PM

## 2014-04-17 NOTE — Progress Notes (Signed)
PULMONARY / CRITICAL CARE MEDICINE  Name: Joanna Perez MRN: [id number] DOB: 1955/12/20    ADMISSION DATE:  04/07/2014 CONSULTATION DATE:  04/09/2014  REFERRING MD :  Earnie Larsson, MD PRIMARY SERVICE: Neurosurgery   CHIEF COMPLAINT:  Paraplegia   BRIEF PATIENT DESCRIPTION: 58 yo with COPD, CHF, DM, CAD s/p MI, atrial fibrillation transferred from [facility] for BLE paraplegia, found to have T6 fracture with spinal stenosis and spinal cord compression on MRI. She was taken for decompressive surgery and remained on mechanical ventilation post surgery. We are consulted for ventilator management. Note 7 admits over last 42mnths  SIGNIFICANT EVENTS / STUDIES:  5/16  Admitted to [facility], hypotensive and with new paraplegia 5/17  Transfer to [facility] 5/17  MR Cervical/Thoracic/Lumbar Spine - worsened T6 compression fracture with paraspinal and epidural hematoma causing cord compression and edema, possible early cord infarct 5/18  Intubation for procedure 5/18  Thoracic laminectomy 5/18  Extubate 5/19  Somnolent / hypercarbic >>> reintubated  LINES / TUBES: ETT 5/18 >>> 5/18; 5/19 >>> R R AL 5/17 >>>out  Foley 5/16 >>> picc rt 5/22>>>  CULTURES: 5/16  Blood >>>  2/2 STAPHYLOCOCCUS SPECIES (COAGULASE NEGATIVE) - Oxacillin sensitive (allergy) 5/16  Urine culture >>> neg 5/25 urine>>>  5/25 sputum>>>  ANTIBIOTICS:  Vancomycin 5/17 >>> Levaquin 5/17 >>> 5/19  INTERVAL HISTORY: remains vented  VITAL SIGNS: Temp:  [97.9 F (36.6 C)-100.6 F (38.1 C)] 98.6 F (37 C) (05/26 0800) Pulse Rate:  [70-118] 72 (05/26 0800) Resp:  [18-27] 20 (05/26 0800) BP: (84-133)/(35-67) 105/58 mmHg (05/26 0800) SpO2:  [91 %-100 %] 98 % (05/26 0800) FiO2 (%):  [40 %] 40 % (05/26 0800) Weight:  [94.3 kg (207 lb 14.3 oz)] 94.3 kg (207 lb 14.3 oz) (05/26 0400) HEMODYNAMICS: CVP:  [10 mmHg-22 mmHg] 10 mmHg VENTILATOR SETTINGS: Vent Mode:  [-] PRVC FiO2 (%):  [40 %] 40 % Set Rate:  [20 bmp] 20  bmp Vt Set:  [400 mL] 400 mL PEEP:  [5 cmH20] 5 cmH20 Pressure Support:  [5 cmH20] 5 cmH20 Plateau Pressure:  [19 cmH20-24 cmH20] 24 cmH20  INTAKE / OUTPUT: Intake/Output     05/25 0701 - 05/26 0700 05/26 0701 - 05/27 0700   I.V. (mL/kg) 672 (7.1) 28 (0.3)   NG/GT 1610    IV Piggyback 400    Total Intake(mL/kg) 2682 (28.4) 28 (0.3)   Urine (mL/kg/hr) 2160 (1)    Total Output 2160     Net +522 +28          PHYSICAL EXAMINATION: General:  Chronically ill appearing female, uncomfortable appearing on vent Neuro:  Awake, alery, follows commands with BUE, no movement BLE, anxious HEENT:  OETT Cardiovascular:  s1 s2  Regular, no murmurs Lungs:  cta Abdomen:  Soft, bowel sounds diminished Musculoskeletal:  No edema Skin:  No rash  LABS:  CBC  Recent Labs Lab 04/15/14 0400 04/16/14 0330 04/17/14 0355  WBC 7.4 8.3 8.4  HGB 8.8* 8.8* 8.9*  HCT 30.0* 28.5* 29.6*  PLT 357 384 438*   Coag's  Recent Labs Lab 04/17/14 0355  INR 1.11   BMET  Recent Labs Lab 04/15/14 0400 04/16/14 0330 04/17/14 0355  NA 147 145 146  K 5.1 4.9 4.7  CL 106 105 103  CO2 34* 35* 37*  BUN 29* 29* 24*  CREATININE 0.46* 0.44* 0.38*  GLUCOSE 196* 198* 175*   Electrolytes  Recent Labs Lab 04/15/14 0400 04/16/14 0330 04/17/14 0355  CALCIUM 8.7 8.9  9.4   Sepsis Markers No results found for this basename: LATICACIDVEN, PROCALCITON, O2SATVEN,  in the last 168 hours ABG  Recent Labs Lab 04/10/14 1635 04/11/14 0405 04/16/14 1600  PHART 7.247* 7.362 7.322*  PCO2ART 68.4* 57.2* 74.0*  PO2ART 107.0* 93.6 78.0*   Liver Enzymes No results found for this basename: AST, ALT, ALKPHOS, BILITOT, ALBUMIN,  in the last 168 hours Cardiac Enzymes No results found for this basename: TROPONINI, PROBNP,  in the last 168 hours Glucose  Recent Labs Lab 04/16/14 1150 04/16/14 1543 04/16/14 1948 04/16/14 2345 04/17/14 0328 04/17/14 0810  GLUCAP 230* 256* 223* 212* 161* 107*    IMAGING: Dg Chest Portable 1 View  04/15/2014   CLINICAL DATA:  Respiratory failure.  EXAM: PORTABLE CHEST - 1 VIEW  COMPARISON:  Apr 10, 2014.  FINDINGS: Stable cardiomediastinal silhouette. Endotracheal tube is in grossly good position with distal tip approximately 3 cm above the Carina. Nasogastric tube is seen entering the stomach. No pneumothorax or significant pleural effusions are noted. Interval placement of right-sided PICC line with tip in expected position of the SVC. Hypoinflation of the lungs is noted with probable minimal bilateral basilar subsegmental atelectasis.  IMPRESSION: Hyperinflation of the lungs with minimal bilateral basilar subsegmental atelectasis. Stable support apparatus.   Electronically Signed   By: Sabino Dick M.D.   On: 04/15/2014 09:55    ASSESSMENT / PLAN:  PULMONARY A: Acute on chronic respiratory failure Resp muscle weakness r/t cord compression/new T6 paraplegia  Hx COPD with ?AECOPD Suspect OSA / OHS History of tracheostomy P:    Wean today cpap5 ps 5-10, assess 1 hr, rsbi Inclined to proceed with trach, failed yesterday wean, small ung volumes DuoNeb / Albuterol / Pulmicort Cont po steroids, reduce Upright Favor neg balance  NEUROLOGIC A:   S/p spinal cord decompression - still no movement BLE  Acute encephalopathy, agitation  P:   Precedex - wua Fentanyl PRN Preadmission Celexa xanax (home med), may need increase IV benzo Taper steroids to 20 pred Neurosurgery following   CARDIOVASCULAR A:  Chronic diastolic heart failure Dyslipidemia  Hypotension - Resolved. CVP 16, good UOP, good mental status. ?autonomic dysreflexia P:  Lipitor, LF Tin am  Goal euvolemic to slight neg F/u CVP 10, lasix to even to neg  RENAL A:   Hypernatremia, mild P:   Trend BMP Cont free water  Lasix to even balance, may need additional free water  GASTROINTESTINAL A:   Nutrition GERD P:   NPO for possioble tarch Protonix as  preadmission TF  HEMATOLOGIC A:   Anemia, mild, stable VTE Px P:  Trend CBC SCD coags wnl  INFECTIOUS A:   Coag neg staph bacteremia Allergy PCN / cephalosporins P:   Abx  as above Repeat urine, sputum culture 5/25 Ensure repeat BC neg Continued vanc If fevers re occur will dc picc  ENDOCRINE  A:   Hyperglycemia Hypothyroidism P:   SSI, lantus  Synthroid Steroid reduction   I have personally obtained history, examined patient, evaluated and interpreted laboratory and imaging results, reviewed medical records, formulated assessment / plan and placed orders.  CRITICAL CARE:  The patient is critically ill with multiple organ systems failure and requires high complexity decision making for assessment and support, frequent evaluation and titration of therapies, application of advanced monitoring technologies and extensive interpretation of multiple databases. Critical Care Time devoted to patient care services described in this note is 35 minutes.    Lavon Paganini. Titus Mould, MD, [place] Pgr: [phone] [place]  Pulmonary & Critical Care f

## 2014-04-17 NOTE — Procedures (Signed)
Bronchoscopy  for Percutaneous  Tracheostomy  Name: Joanna Perez MRN: [id number] DOB: 26-Jul-1956 Procedure: Bronchoscopy for Percutaneous Tracheostomy Indications: Diagnostic evaluation of the airways, Obtain specimens for culture and/or other diagnostic studies and Remove secretions In conjunction with: Dr. Titus Mould   Procedure Details Consent: Risks of procedure as well as the alternatives and risks of each were explained to the (patient/caregiver).  Consent for procedure obtained. Time Out: Verified patient identification, verified procedure, site/side was marked, verified correct patient position, special equipment/implants available, medications/allergies/relevent history reviewed, required imaging and test results available.  Performed  In preparation for procedure, patient was given 100% FiO2 and bronchoscope lubricated. Sedation: Benzodiazepines, Muscle relaxants and Short-acting barbiturates  Airway entered and the following bronchi were examined: RML.   Procedures performed: Endotracheal Tube retracted in 2 cm increments. Cannulation of airway observed. Dilation observed. Placement of trachel tube  observed . No overt complications. Bronchoscope removed.  , Patient placed back on 100% FiO2 at conclusion of procedure.    Evaluation Hemodynamic Status: BP stable throughout; O2 sats: stable throughout Patient's Current Condition: stable Specimens:  Sent purulent fluid Complications: No apparent complications Patient did tolerate procedure well.   Joanna Perez ACNP [facility] PCCM Pager [phone] till 3 pm If no answer page [phone]  Supervised by me  Rigoberto Noel MD 04/17/2014, 11:50 AM

## 2014-04-17 NOTE — Procedures (Signed)
called as attempted NGT not able to remove  Placed laryngoscope, noted panda in airway, likely hung up on trach / cuff Drop cuff, easily removed  Then directly visualized NGT panda placement into esoph, no resistance Placed at 55 cm Good sounds in gut  Lavon Paganini. Titus Mould, MD, [place] Pgr: [place] [facility]

## 2014-04-17 NOTE — Progress Notes (Signed)
Inpatient Diabetes Program Recommendations  Insulin - Meal Coverage: Tube feed coverage. Pt receiving Vital 1.5 at 50 ml/hr. This give the pt 37 grams of carbohydrate every 4 hrs. Please cover this intake with 3-4 units novolog q 4 hrs to be given with correction novolog at the same time.  Thank you, Rosita Kea, RN, CNS, Diabetes Coordinator [phone])

## 2014-04-17 NOTE — Progress Notes (Signed)
UR completed. Pt has Medicaid so will NOT be a candidate for LTACH if she is unable to wean from vent.  Sandi Mariscal, RN BSN [place] CCM Trauma/Neuro ICU Case Manager [phone]

## 2014-04-17 NOTE — Procedures (Signed)
Bedside Tracheostomy Insertion Procedure Note   Patient Details:   Name: CADEE AGRO DOB: [birth year] MRN: [id number]  Procedure: Tracheostomy  Pre Procedure Assessment: ET Tube Size:7.5 ET Tube secured at lip (cm):17 Bite block in place: Yes Breath Sounds: Rhonch  Post Procedure Assessment: BP 93/63  Pulse 76  Temp(Src) 98.9 F (37.2 C) (Oral)  Resp 24  Ht 5\' 2"  (1.575 m)  Wt 207 lb 14.3 oz (94.3 kg)  BMI 38.01 kg/m2  SpO2 100% O2 sats: stable throughout Complications: No apparent complications Patient did tolerate procedure well Tracheostomy Brand:Shiley Tracheostomy Style:Cuffed Tracheostomy Size: 6.0 Tracheostomy Secured QZE:[id number] and velcro Tracheostomy Placement Confirmation:Trach cuff visualized and in place and chest x-ray  Pt ventilator settings changed to 100% and RR of 24.  S.Brewer,RRT informed of the changes  Georgie Chard 04/17/2014, 3:14 PM

## 2014-04-18 ENCOUNTER — Inpatient Hospital Stay (HOSPITAL_COMMUNITY): Payer: Medicaid Other

## 2014-04-18 DIAGNOSIS — J9620 Acute and chronic respiratory failure, unspecified whether with hypoxia or hypercapnia: Secondary | ICD-10-CM

## 2014-04-18 DIAGNOSIS — G9340 Encephalopathy, unspecified: Secondary | ICD-10-CM

## 2014-04-18 LAB — GLUCOSE, CAPILLARY
GLUCOSE-CAPILLARY: 119 mg/dL — AB (ref 70–99)
GLUCOSE-CAPILLARY: 133 mg/dL — AB (ref 70–99)
Glucose-Capillary: 102 mg/dL — ABNORMAL HIGH (ref 70–99)
Glucose-Capillary: 164 mg/dL — ABNORMAL HIGH (ref 70–99)
Glucose-Capillary: 187 mg/dL — ABNORMAL HIGH (ref 70–99)
Glucose-Capillary: 222 mg/dL — ABNORMAL HIGH (ref 70–99)
Glucose-Capillary: 184 mg/dL — ABNORMAL HIGH (ref 70–99)

## 2014-04-18 LAB — CBC WITH DIFFERENTIAL/PLATELET
BASOS PCT: 0 % (ref 0–1)
Basophils Absolute: 0 10*3/uL (ref 0.0–0.1)
EOS PCT: 1 % (ref 0–5)
Eosinophils Absolute: 0.1 10*3/uL (ref 0.0–0.7)
HCT: 29.8 % — ABNORMAL LOW (ref 36.0–46.0)
HEMOGLOBIN: 8.8 g/dL — AB (ref 12.0–15.0)
Lymphocytes Relative: 11 % — ABNORMAL LOW (ref 12–46)
Lymphs Abs: 1 10*3/uL (ref 0.7–4.0)
MCH: 26.3 pg (ref 26.0–34.0)
MCHC: 29.5 g/dL — ABNORMAL LOW (ref 30.0–36.0)
MCV: 89 fL (ref 78.0–100.0)
MONOS PCT: 6 % (ref 3–12)
Monocytes Absolute: 0.5 10*3/uL (ref 0.1–1.0)
NEUTROS PCT: 82 % — AB (ref 43–77)
Neutro Abs: 7.2 10*3/uL (ref 1.7–7.7)
PLATELETS: ADEQUATE 10*3/uL (ref 150–400)
RBC: 3.35 MIL/uL — AB (ref 3.87–5.11)
RDW: 20.1 % — ABNORMAL HIGH (ref 11.5–15.5)
WBC: 8.8 10*3/uL (ref 4.0–10.5)

## 2014-04-18 LAB — COMPREHENSIVE METABOLIC PANEL
ALBUMIN: 1.6 g/dL — AB (ref 3.5–5.2)
ALT: 126 U/L — ABNORMAL HIGH (ref 0–35)
AST: 169 U/L — AB (ref 0–37)
Alkaline Phosphatase: 64 U/L (ref 39–117)
BILIRUBIN TOTAL: 0.3 mg/dL (ref 0.3–1.2)
BUN: 26 mg/dL — AB (ref 6–23)
CALCIUM: 9.1 mg/dL (ref 8.4–10.5)
CHLORIDE: 96 meq/L (ref 96–112)
CO2: 37 meq/L — AB (ref 19–32)
CREATININE: 0.43 mg/dL — AB (ref 0.50–1.10)
GFR calc Af Amer: >90 mL/min (ref >90)
Glucose, Bld: 218 mg/dL — ABNORMAL HIGH (ref 70–99)
Potassium: 4 mEq/L (ref 3.7–5.3)
Sodium: 142 mEq/L (ref 137–147)
Total Protein: 4.7 g/dL — ABNORMAL LOW (ref 6.0–8.3)

## 2014-04-18 LAB — OCCULT BLOOD X 1 CARD TO LAB, STOOL: FECAL OCCULT BLD: POSITIVE — AB

## 2014-04-18 MED ORDER — IPRATROPIUM-ALBUTEROL 0.5-2.5 (3) MG/3ML IN SOLN
3.0000 mL | Freq: Two times a day (BID) | RESPIRATORY_TRACT | Status: DC
  Administered 2014-04-18 – 2014-05-11 (×46): 3 mL via RESPIRATORY_TRACT
  Filled 2014-04-18 (×46): qty 3

## 2014-04-18 MED ORDER — PREDNISONE 10 MG PO TABS
10.0000 mg | ORAL_TABLET | Freq: Every day | ORAL | Status: DC
  Administered 2014-04-19 – 2014-05-08 (×20): 10 mg via ORAL
  Filled 2014-04-18 (×24): qty 1

## 2014-04-18 MED ORDER — FUROSEMIDE 10 MG/ML IJ SOLN
40.0000 mg | Freq: Two times a day (BID) | INTRAMUSCULAR | Status: AC
  Administered 2014-04-18: 40 mg via INTRAVENOUS
  Filled 2014-04-18: qty 4

## 2014-04-18 NOTE — Progress Notes (Signed)
Report called and patient transferred to 6N62 without complications.  All patient belongings travelled with her, including emergency equipment for the trach.  RN met at bedside.

## 2014-04-18 NOTE — Progress Notes (Signed)
Just received pt from ICU neuro and got pt settled into 2c02. Pt came on trach collar but has vent in room in case she may need it.  Called central telemetry and notified of pt arriving to floor. Pt has heart monitor leads on and in place and is picking up on monitor. Pt now resting.

## 2014-04-18 NOTE — Evaluation (Signed)
[facility] Speaking Valve - Evaluation Patient Details  Name: Joanna Perez MRN: [id number] Date of Birth: 1956-10-24  Today's Date: 04/18/2014 Time: 1340-1420 SLP Time Calculation (min): 40 min  Past Medical History:  Past Medical History  Diagnosis Date  . Asthma   . COPD (chronic obstructive pulmonary disease)   . Hypothyroidism   . Essential hypertension, benign   . Hyperlipidemia   . Type 2 diabetes mellitus   . Atrial fibrillation   . MI (myocardial infarction)     2012  . Pericardial effusion 09/09/2013  . CHF (congestive heart failure)   . On home O2     4L N/C continuous  . Thoracic compression fracture 03/23/2014  . Ventral hernia    Past Surgical History:  Past Surgical History  Procedure Laterality Date  . Hernia repair    . Abdominal hysterectomy    . Stomach surgery      Wound vac currently in place  . Esophagogastroduodenoscopy  11/11/2011    Procedure: ESOPHAGOGASTRODUODENOSCOPY (EGD);  Surgeon: Rogene Houston, MD;  Location: [facility];  Service: Endoscopy;  Laterality: N/A;  . Foreign body removal  11/11/2011    Procedure: FOREIGN BODY REMOVAL;  Surgeon: Rogene Houston, MD;  Location: [facility];  Service: Endoscopy;  Laterality: N/A;  . Tracheal surgery    . Abdominal surgery    . Laminectomy N/A 04/08/2014    Procedure: THORACIC LAMINECTOMY ;  Surgeon: Charlie Pitter, MD;  Location: [facility];  Service: Neurosurgery;  Laterality: N/A;  . Tracheostomy  04/17/14    Titus Mould   HPI:  58 year old female with multiple comorbidities admitted 04/07/14 due to T3 stenosis. Pt was dc'd 10 days prior to admit due to COPD exacerbation. PMH significant for asthma, COPD, hypothyroid, HLD, DM2, AFib, CHF. Pt waqs intubated 04/09/14 for T5-Y8 decompressive laminectomy and evacuation of hematoma. She was extubated 5/18, then reintubated 5/19-5/26 at which time trach was placed.   Assessment / Plan / Recommendation Clinical Impression  Pt tolerated cuff  deflation, but when PMSV placed, O2 sats dropped quickly to mid-low 80's.  No breath stacking was noted on removal of valve.  Pt voice quality during cuff deflation/finger occlusion was harsh and gravelly. No voicing noted with PMSV trial.  PMSV was removed and cuff left deflated.  RN informed.  Pt started exhibiting anxious/fearful facial expression. SLP provided communication board, and pt indicated she needed suctioning. RN was summoned to pt room.  Pt attempts to mouthe words, but does so very quickly and is edentulous, which makes it very difficult to tell what she is trying to say.  Hopefully communication board will facilitate effective communication with staff..  ST to continue PMSV trials as pt tolerates, and will complete BSE once pt tolerating speaking valve.    SLP Assessment  Patient needs continued Speech Lanaguage Pathology Services    Follow Up Recommendations   (TBD)    Frequency and Duration min 2x/week  2 weeks   Pertinent Vitals/Pain VSS, O2 dropped to mid-low 80s during PMSV trial, returned to 90s after removal.    SLP Goals Potential to Achieve Goals: Fair Potential Considerations: Ability to learn/carryover information;Family/community support;Co-morbidities;Cooperation/participation level;Previous level of function;Severity of impairments;Medical prognosis;Pain level   PMSV Trial  PMSV was placed for: < 5 min Able to redirect subglottic air through upper airway: Yes Able to Attain Phonation: No Voice Quality: Low vocal intensity (gravelly) Able to Expectorate Secretions: No attempts Level of Secretion Expectoration with PMSV: Not  observed Breath Support for Phonation: Inadequate Intelligibility: Intelligibility reduced Word: 0-24% accurate Phrase: 0-24% accurate Sentence: 0-24% accurate Conversation: 0-24% accurate Respirations During Trial: 22 SpO2 During Trial: 91 % Pulse During Trial: 105 Behavior: Anxious   Tracheostomy Tube    #6 Cuffed Shiley    Vent Dependency  FiO2 (%): 40 %    Cuff Deflation Trial Tolerated Cuff Deflation: Yes Length of Time for Cuff Deflation Trial: 5 min Behavior: Anxious   Anzel Kearse B Gwenevere Goga 04/18/2014, 2:53 PM  Stevie Ertle B. Quentin Ore [facility], [place] [phone]

## 2014-04-18 NOTE — Progress Notes (Signed)
RT placed patient on 40% ATC. No complications. Patient tolerating well at this time. Vital signs stable. RT will continue to monitor.

## 2014-04-18 NOTE — Progress Notes (Signed)
PULMONARY / CRITICAL CARE MEDICINE  Name: Joanna Perez MRN: [id number] DOB: December 19, 1955    ADMISSION DATE:  04/07/2014 CONSULTATION DATE:  04/09/2014  REFERRING MD :  Earnie Larsson, MD PRIMARY SERVICE: Neurosurgery   CHIEF COMPLAINT:  Paraplegia   BRIEF PATIENT DESCRIPTION: 58 yo with COPD, CHF, DM, CAD s/p MI, atrial fibrillation transferred from [facility] for BLE paraplegia, found to have T6 fracture with spinal stenosis and spinal cord compression on MRI. She was taken for decompressive surgery and remained on mechanical ventilation post surgery. We are consulted for ventilator management. Note 7 admits over last 56mnths  SIGNIFICANT EVENTS / STUDIES:  5/16  Admitted to [facility], hypotensive and with new paraplegia 5/17  Transfer to [facility] 5/17  MR Cervical/Thoracic/Lumbar Spine - worsened T6 compression fracture with paraspinal and epidural hematoma causing cord compression and edema, possible early cord infarct 5/18  Intubation for procedure 5/18  Thoracic laminectomy 5/18  Extubate 5/19  Somnolent / hypercarbic >>> reintubated 5/26 trach  LINES / TUBES: ETT 5/18 >>> 5/18; 5/19 >>>5/26 Trach 5/26 (df)>>> R R AL 5/17 >>>out  Foley 5/16 >>> picc rt 5/22>>>  CULTURES: 5/16  Blood >>>  2/2 STAPHYLOCOCCUS SPECIES (COAGULASE NEGATIVE) - Oxacillin sensitive (allergy) 5/16  Urine culture >>> neg 5/25 urine>>>  5/25 sputum>>>  ANTIBIOTICS:  Vancomycin 5/17 >>> Levaquin 5/17 >>> 5/19  INTERVAL HISTORY: trach placed, had BM  VITAL SIGNS: Temp:  [97.7 F (36.5 C)-100.4 F (38 C)] 98.8 F (37.1 C) (05/27 0334) Pulse Rate:  [65-111] 95 (05/27 0800) Resp:  [18-24] 21 (05/27 0800) BP: (79-129)/(41-74) 123/57 mmHg (05/27 0800) SpO2:  [90 %-100 %] 99 % (05/27 0829) FiO2 (%):  [40 %-100 %] 40 % (05/27 0830) Weight:  [96.1 kg (211 lb 13.8 oz)] 96.1 kg (211 lb 13.8 oz) (05/27 0600) HEMODYNAMICS: CVP:  [0 mmHg-19 mmHg] 6 mmHg VENTILATOR SETTINGS: Vent Mode:  [-]  CPAP;PSV FiO2 (%):  [40 %-100 %] 40 % Set Rate:  [20 bmp-24 bmp] 20 bmp Vt Set:  [400 mL] 400 mL PEEP:  [5 cmH20] 5 cmH20 Pressure Support:  [10 cmH20] 10 cmH20 Plateau Pressure:  [17 cmH20-26 cmH20] 17 cmH20  INTAKE / OUTPUT: Intake/Output     05/26 0701 - 05/27 0700 05/27 0701 - 05/28 0700   I.V. (mL/kg) 123.2 (1.3)    Other 30    NG/GT 1650 50   IV Piggyback 400    Total Intake(mL/kg) 2203.2 (22.9) 50 (0.5)   Urine (mL/kg/hr) 995 (0.4)    Total Output 995     Net +1208.2 +50          PHYSICAL EXAMINATION: General:  Chronically ill appearing female Neuro:  Awake, alery, follows commands with BUE, no movement BLE HEENT:  Trach wnl, no blood Cardiovascular:  s1 s2  Regular, no murmurs Lungs:  cta Abdomen:  Soft, bowel sounds diminished, mild distention, reducible ventral hernia Musculoskeletal:  No edema Skin:  No rash  LABS:  CBC  Recent Labs Lab 04/16/14 0330 04/17/14 0355 04/18/14 0500  WBC 8.3 8.4 8.8  HGB 8.8* 8.9* 8.8*  HCT 28.5* 29.6* 29.8*  PLT 384 438* PLATELET CLUMPS NOTED ON SMEAR, COUNT APPEARS ADEQUATE   Coag's  Recent Labs Lab 04/17/14 0355  INR 1.11   BMET  Recent Labs Lab 04/16/14 0330 04/17/14 0355 04/18/14 0500  NA 145 146 142  K 4.9 4.7 4.0  CL 105 103 96  CO2 35* 37* 37*  BUN 29* 24* 26*  CREATININE 0.44*  0.38* 0.43*  GLUCOSE 198* 175* 218*   Electrolytes  Recent Labs Lab 04/16/14 0330 04/17/14 0355 04/18/14 0500  CALCIUM 8.9 9.4 9.1   Sepsis Markers No results found for this basename: LATICACIDVEN, PROCALCITON, O2SATVEN,  in the last 168 hours ABG  Recent Labs Lab 04/16/14 1600  PHART 7.322*  PCO2ART 74.0*  PO2ART 78.0*   Liver Enzymes  Recent Labs Lab 04/18/14 0500  AST 169*  ALT 126*  ALKPHOS 64  BILITOT 0.3  ALBUMIN 1.6*   Cardiac Enzymes No results found for this basename: TROPONINI, PROBNP,  in the last 168 hours Glucose  Recent Labs Lab 04/17/14 1209 04/17/14 1548 04/17/14 1945  04/17/14 2341 04/18/14 0331 04/18/14 0815  GLUCAP 96 107* 150* 119* 102* 164*   IMAGING: Dg Chest Port 1 View  04/18/2014   CLINICAL DATA:  Evaluate infiltrates and tracheostomy  EXAM: PORTABLE CHEST - 1 VIEW  COMPARISON:  04/17/2014  FINDINGS: Lung base opacity has improved. There is mild persistent lung base opacity that is likely atelectasis. No pulmonary edema.  Cardiac silhouette is mildly enlarged. Tracheostomy tube, enteric tube and right PICC are stable and well positioned.  IMPRESSION: 1. Improved lung aeration.  Mild persistent basilar atelectasis.   Electronically Signed   By: Lajean Manes M.D.   On: 04/18/2014 08:00   Dg Chest Port 1 View  04/17/2014   CLINICAL DATA:  Nasogastric tube repositioning.  EXAM: PORTABLE CHEST - 1 VIEW  COMPARISON:  Multiple exams, including 04/17/2014 at 12:37 p.m.  FINDINGS: Nasogastric tube extends down into the stomach and beyond the inferior margin of today's image.  Tracheostomy tube projects over the tracheal air shadow. Right PICC line tip: Cavoatrial junction.  Bibasilar airspace opacities are again identified, obscuring the hemidiaphragms. Cardiomegaly observed. Thoracic spondylosis and suspected thoracic compression fractures as shown on MRI of 04/08/2014.  IMPRESSION: 1. Nasogastric tube extends into the stomach and beyond the inferior margin of today's image. Otherwise stable.   Electronically Signed   By: Sherryl Barters M.D.   On: 04/17/2014 13:28   Dg Chest Port 1 View  04/17/2014   CLINICAL DATA:  Tracheostomy placement  EXAM: PORTABLE CHEST - 1 VIEW  COMPARISON:  04/15/2014  FINDINGS: Tracheostomy has been placed in satisfactory position. No pneumothorax.  Hypoventilation with bibasilar atelectasis and bilateral effusions. Right arm PICC tip in the SVC.  IMPRESSION: Satisfactory tracheostomy placement.  Bibasilar atelectasis and bilateral pleural effusions.   Electronically Signed   By: Franchot Gallo M.D.   On: 04/17/2014 12:53     ASSESSMENT / PLAN:  PULMONARY A: Acute on chronic respiratory failure Resp muscle weakness r/t cord compression/new T6 paraplegia  Hx COPD with ?AECOPD Suspect OSA / OHS History of tracheostomy, s/p trach 5/26 P:    Consider trach collar attempt, if fails then cpap5 ps 5 DuoNeb / Albuterol / Pulmicort Cont po steroids, reduce further not needed Upright Favor neg balance Pmv?, swallow  NEUROLOGIC A:   S/p spinal cord decompression - still no movement BLE  Acute encephalopathy, agitation  P:   Precedex off Fentanyl PRN Celexa xanax (home med) Taper steroids to 10 pred Neurosurgery following   CARDIOVASCULAR A:  Chronic diastolic heart failure Dyslipidemia  Hypotension - Resolved. CVP 16, good UOP, good mental status. ?autonomic dysreflexia P:  Lipitor, LF Tin am noted, dc F/u CVP 10, lasix increase  RENAL A:   Pos balance P:   Trend BMP Dc free water  Lasix to even balance, may need additional free water  GASTROINTESTINAL A:   Nutrition GERD P:   Swallow eval Protonix as preadmission TF  HEMATOLOGIC A:   Anemia, mild, stable VTE Px P:  Trend CBC SCD  INFECTIOUS A:   Coag neg staph bacteremia Allergy PCN / cephalosporins P:   Follow repeat BC Continued vanc, will establish stop date  ENDOCRINE  A:   Hyperglycemia Hypothyroidism P:   SSI, lantus maintain Synthroid Steroid reduction to 20    I have personally obtained history, examined patient, evaluated and interpreted laboratory and imaging results, reviewed medical records, formulated assessment / plan and placed orders.  CRITICAL CARE:  The patient is critically ill with multiple organ systems failure and requires high complexity decision making for assessment and support, frequent evaluation and titration of therapies, application of advanced monitoring technologies and extensive interpretation of multiple databases. Critical Care Time devoted to patient care services described  in this note is 30 minutes.    Lavon Paganini. Titus Mould, MD, [place] Pgr: [facility]

## 2014-04-18 NOTE — Progress Notes (Signed)
ANTIBIOTIC CONSULT NOTE - FOLLOW UP  Pharmacy Consult for vancomycin Indication: CNS bacteremia with PCN allergy  Allergies  Allergen Reactions  . Amoxicillin Hives  . Bactrim [Sulfamethoxazole-Tmp Ds] Hives  . Erythromycin Hives  . Keflex [Cephalexin] Hives  . Penicillins Itching and Swelling    Sweating    Patient Measurements: Height: 5\' 2"  (157.5 cm) Weight: 211 lb 13.8 oz (96.1 kg) IBW/kg (Calculated) : 50.1 Vital Signs: Temp: 98.8 F (37.1 C) (05/27 0334) Temp src: Oral (05/27 0334) BP: 123/57 mmHg (05/27 0800) Pulse Rate: 95 (05/27 0800) Intake/Output from previous day: 05/26 0701 - 05/27 0700 In: 2203.2 [I.V.:123.2; NG/GT:1650; IV Piggyback:400] Out: 995 [Urine:995] Intake/Output from this shift: Total I/O In: 50 [NG/GT:50] Out: -   Labs:  Recent Labs  04/16/14 0330 04/17/14 0355 04/18/14 0500  WBC 8.3 8.4 8.8  HGB 8.8* 8.9* 8.8*  PLT 384 438* PLATELET CLUMPS NOTED ON SMEAR, COUNT APPEARS ADEQUATE  CREATININE 0.44* 0.38* 0.43*   Estimated Creatinine Clearance: 83.9 ml/min (by C-G formula based on Cr of 0.43).  Recent Labs  04/15/14 1640  VANCOTROUGH 15.1     Microbiology: Recent Results (from the past 720 hour(s))  URINE CULTURE     Status: None   Collection Time    04/07/14 11:50 AM      Result Value Ref Range Status   Specimen Description URINE, CATHETERIZED   Final   Special Requests NONE   Final   Culture  Setup Time     Final   Value: 04/08/2014 01:35     Performed at [facility]     Final   Value: NO GROWTH     Performed at [facility]   Culture     Final   Value: NO GROWTH     Performed at [facility]   Report Status 04/09/2014 FINAL   Final  CULTURE, BLOOD (ROUTINE X 2)     Status: None   Collection Time    04/07/14 12:02 PM      Result Value Ref Range Status   Specimen Description BLOOD RIGHT ANTECUBITAL   Final   Special Requests BOTTLES DRAWN AEROBIC ONLY [facility]   Final   Culture   Setup Time     Final   Value: 04/08/2014 21:13     Performed at [facility]   Culture     Final   Value: STAPHYLOCOCCUS SPECIES (COAGULASE NEGATIVE)     Note: RIFAMPIN AND GENTAMICIN SHOULD NOT BE USED AS SINGLE DRUGS FOR TREATMENT OF STAPH INFECTIONS.     Note: Performed at [facility]     Performed at [facility]   Report Status 04/10/2014 FINAL   Final   Organism ID, Bacteria STAPHYLOCOCCUS SPECIES (COAGULASE NEGATIVE)   Final  CULTURE, BLOOD (ROUTINE X 2)     Status: None   Collection Time    04/07/14 12:02 PM      Result Value Ref Range Status   Specimen Description BLOOD RIGHT HAND   Final   Special Requests BOTTLES DRAWN AEROBIC AND ANAEROBIC 8CC   Final   Culture  Setup Time     Final   Value: 04/08/2014 21:16     Performed at [facility]   Culture     Final   Value: STAPHYLOCOCCUS SPECIES (COAGULASE NEGATIVE)     Note: SUSCEPTIBILITIES PERFORMED ON PREVIOUS CULTURE WITHIN THE LAST 5 DAYS.     Note: Performed at [facility]  Hospital Gram Stain Report Called to,Read Back By and Verified With: STONE A 04/08/14 0811 BY THOMPSON S     Performed at [facility]   Report Status 04/10/2014 FINAL   Final  SURGICAL PCR SCREEN     Status: None   Collection Time    04/08/14  7:54 PM      Result Value Ref Range Status   MRSA, PCR NEGATIVE  NEGATIVE Final   Staphylococcus aureus NEGATIVE  NEGATIVE Final   Comment:            The Xpert SA Assay (FDA     approved for NASAL specimens     in patients over 6 years of age),     is one component of     a comprehensive surveillance     program.  Test performance has     been validated by [employer] for patients greater     than or equal to 58 year old.     It is not intended     to diagnose infection nor to     guide or monitor treatment.  CULTURE, RESPIRATORY (NON-EXPECTORATED)     Status: None   Collection Time    04/16/14  6:06 PM      Result Value Ref Range Status   Specimen  Description TRACHEAL ASPIRATE   Final   Special Requests NONE   Final   Gram Stain     Final   Value: FEW WBC PRESENT,BOTH PMN AND MONONUCLEAR     RARE SQUAMOUS EPITHELIAL CELLS PRESENT     RARE GRAM POSITIVE COCCI     IN PAIRS     Performed at [facility]   Culture     Final   Value: Culture reincubated for better growth     Performed at [facility]   Report Status PENDING   Incomplete  CULTURE, BLOOD (ROUTINE X 2)     Status: None   Collection Time    04/17/14  9:35 AM      Result Value Ref Range Status   Specimen Description BLOOD LEFT ANTECUBITAL   Final   Special Requests BOTTLES DRAWN AEROBIC ONLY 1CC   Final   Culture  Setup Time     Final   Value: 04/17/2014 14:03     Performed at [facility]   Culture     Final   Value:        BLOOD CULTURE RECEIVED NO GROWTH TO DATE CULTURE WILL BE HELD FOR 5 DAYS BEFORE ISSUING A FINAL NEGATIVE REPORT     Performed at [facility]   Report Status PENDING   Incomplete  CULTURE, BLOOD (ROUTINE X 2)     Status: None   Collection Time    04/17/14  9:40 AM      Result Value Ref Range Status   Specimen Description BLOOD LEFT HAND   Final   Special Requests BOTTLES DRAWN AEROBIC ONLY 2CC   Final   Culture  Setup Time     Final   Value: 04/17/2014 14:03     Performed at [facility]   Culture     Final   Value:        BLOOD CULTURE RECEIVED NO GROWTH TO DATE CULTURE WILL BE HELD FOR 5 DAYS BEFORE ISSUING A FINAL NEGATIVE REPORT     Performed at [facility]   Report Status PENDING   Incomplete  CULTURE, RESPIRATORY (NON-EXPECTORATED)     Status: None   Collection Time    04/17/14 11:55 AM      Result Value Ref Range Status   Specimen Description TRACHEAL ASPIRATE   Final   Special Requests Normal   Final   Gram Stain     Final   Value: ABUNDANT WBC PRESENT, PREDOMINANTLY PMN     RARE SQUAMOUS EPITHELIAL CELLS PRESENT     NO ORGANISMS SEEN     Performed at [facility]    Culture PENDING   Incomplete   Report Status PENDING   Incomplete    Anti-infectives   Start     Dose/Rate Route Frequency Ordered Stop   04/13/14 1730  vancomycin (VANCOCIN) IVPB 1000 mg/200 mL premix     1,000 mg 200 mL/hr over 60 Minutes Intravenous Every 12 hours 04/13/14 0847     04/10/14 1730  vancomycin (VANCOCIN) IVPB 750 mg/150 ml premix  Status:  Discontinued     750 mg 150 mL/hr over 60 Minutes Intravenous Every 12 hours 04/10/14 1112 04/10/14 1116   04/10/14 1730  vancomycin (VANCOCIN) IVPB 750 mg/150 ml premix  Status:  Discontinued     750 mg 150 mL/hr over 60 Minutes Intravenous Every 12 hours 04/10/14 1136 04/13/14 0847   04/08/14 2317  bacitracin 50,000 Units in sodium chloride irrigation 0.9 % 500 mL irrigation  Status:  Discontinued       As needed 04/08/14 2317 04/09/14 0049   04/08/14 1630  vancomycin (VANCOCIN) IVPB 750 mg/150 ml premix  Status:  Discontinued     750 mg 150 mL/hr over 60 Minutes Intravenous Every 12 hours 04/08/14 1538 04/10/14 1111   04/08/14 1000  levofloxacin (LEVAQUIN) tablet 500 mg  Status:  Discontinued     500 mg Oral Daily 04/07/14 1636 04/10/14 1116   04/07/14 1615  levofloxacin (LEVAQUIN) tablet 500 mg  Status:  Discontinued     500 mg Oral Daily 04/07/14 1611 04/07/14 1635      Assessment: 64 YOF on vancomycin day # 11 for coagulase negative staph bacteremia (PCN allergic). Repeat blood cultures on 5/26 with ngtd. WBC is within normal limits. SCR is stable. Tmax 100.4. SCr is stable.   5/22 VT = 11.1 -> increase to 1 G Q 12 5/24 VT 15.1 >> continue  Goal of Therapy:  Vancomycin trough level 15-20 mcg/ml  Plan:  Continue vancomycin to 1g IV q12h.  Follow-up duration of therapy.  Follow-up weekly trough if continues on therapy.   Sloan Leiter, PharmD, BCPS Clinical Pharmacist [phone] 04/18/2014,9:29 AM

## 2014-04-18 NOTE — Progress Notes (Signed)
**Note De-Identified Joanna Perez Obfuscation** RT note: patient placed back on vent due to SAT in the low 80s with multiply attempts to stabilize SAT with FI02 of 98%.

## 2014-04-18 NOTE — Progress Notes (Signed)
CRITICAL VALUE ALERT  Critical value received:  Positive blood culture x 2   Date of notification:  04/18/14  Time of notification:  1858  Critical value read back:gram positive cocci in clusters  Nurse who received alert: Andreas Newport RN  MD notified (1st page):  Dr. Nelda Marseille  Time of first page:  1858  MD notified (2nd page):  Time of second page:  Responding MD:  Dr. Nelda Marseille Time MD responded: 1900

## 2014-04-18 NOTE — Op Note (Signed)
NAMEBLAKE, GOYA NO.:  [id number]  MEDICAL RECORD NO.:  [id number]  LOCATION:                                 FACILITY:  PHYSICIAN:  Raylene Miyamoto, MD DATE OF BIRTH:  26-Mar-1956  DATE OF PROCEDURE: DATE OF DISCHARGE:                              OPERATIVE REPORT   PROCEDURE:  Percutaneous tracheostomy (redo from 2012).  INDICATIONS FOR PROCEDURE:  Consent was obtained with the patient's husband, fully aware of risks and benefits of the procedure including infection, bleeding, pneumothorax, and death.  The patient's coagulation panel and platelet count was all within normal.  PREPROCEDURE:  The patient required propofol bolus and infusion, Versed, and fentanyl and vecuronium.  Ventilator was adjusted with increase in minimum ventilation during the case.  BRONCHOSCOPIST FOR THE PROCEDURE:  Rigoberto Noel, MD.  FIRST ASSISTANT:  Gaylyn Lambert, MSN, ACNP ACNP.  DESCRIPTION OF PROCEDURE:  The patient was placed in a supine position. Chlorhexidine preparation was used to sterilize the operative site over the old tracheostomy scar, which hovered over the 1st to 2nd endotracheal space.  A 6 mL of lidocaine and epinephrine was injected right through the old tracheostomy vertical scar.  A 1 cm vertical incision was made through the old scar and dissection was made down to the strap muscles and noted to go through the strap muscles between to the tracheal planes.  I did note likely some thyroid tissue inferior to the working area and an anterior jugular vein to the left, which was not involved in the tracheostomy space.  An 18-gauge needle was placed over white catheter sheath into the airway directly visualized to go directly through the old tracheostomy membrane scar under bronchoscopic evaluation.  The ET tube was already backed up to approximately 16 cm. The white catheter sheath remained and the needle was removed.  Wire was placed through the white  catheter sheath and white catheter sheath was removed.  A 14-French punch dilator was placed in and out of the airway over the wire.  A progressive rhino dilator was placed over a the wire into the airway and to approximately 30-French.  The dilator was removed.  The glidewire remained.  I then placed a size 6 tracheostomy over 26-French dilator over the glider wire successfully into the airway directly through the old membrane.  Everything was removed except for the tracheostomy.  Tracheostomy sutured in place with 4-0 monofilament sutures.  Blood loss for the procedure was less than 10 mL.  The patient tolerated the procedure quite well with normal vitals throughout, normal saturations 100% on the ventilator machine.  The hemodynamics remain within normal limits.  We now await portable chest x-ray.  Postoperatively, the patient tolerated procedure quite well.  This patient is to follow up in our Percutaneous Tracheostomy Clinic by calling [phone].     Raylene Miyamoto, MD     DJF/MEDQ  D:  04/17/2014  T:  04/18/2014  Job:  [id number]  cc:   Henry A. Pool, M.D.

## 2014-04-19 ENCOUNTER — Inpatient Hospital Stay (HOSPITAL_COMMUNITY): Payer: Medicaid Other

## 2014-04-19 DIAGNOSIS — J9600 Acute respiratory failure, unspecified whether with hypoxia or hypercapnia: Secondary | ICD-10-CM

## 2014-04-19 DIAGNOSIS — R7881 Bacteremia: Secondary | ICD-10-CM

## 2014-04-19 DIAGNOSIS — I509 Heart failure, unspecified: Secondary | ICD-10-CM

## 2014-04-19 LAB — BASIC METABOLIC PANEL
BUN: 37 mg/dL — AB (ref 6–23)
CO2: 38 meq/L — AB (ref 19–32)
Calcium: 8.9 mg/dL (ref 8.4–10.5)
Chloride: 98 mEq/L (ref 96–112)
Creatinine, Ser: 0.59 mg/dL (ref 0.50–1.10)
GFR calc non Af Amer: >90 mL/min (ref >90)
GLUCOSE: 119 mg/dL — AB (ref 70–99)
POTASSIUM: 4.2 meq/L (ref 3.7–5.3)
Sodium: 143 mEq/L (ref 137–147)

## 2014-04-19 LAB — CBC WITH DIFFERENTIAL/PLATELET
Basophils Absolute: 0 10*3/uL (ref 0.0–0.1)
Basophils Relative: 0 % (ref 0–1)
Eosinophils Absolute: 0 10*3/uL (ref 0.0–0.7)
Eosinophils Relative: 0 % (ref 0–5)
HCT: 28.6 % — ABNORMAL LOW (ref 36.0–46.0)
HEMOGLOBIN: 8.6 g/dL — AB (ref 12.0–15.0)
Lymphocytes Relative: 12 % (ref 12–46)
Lymphs Abs: 1.2 10*3/uL (ref 0.7–4.0)
MCH: 26.8 pg (ref 26.0–34.0)
MCHC: 30.1 g/dL (ref 30.0–36.0)
MCV: 89.1 fL (ref 78.0–100.0)
MONO ABS: 0.7 10*3/uL (ref 0.1–1.0)
MONOS PCT: 7 % (ref 3–12)
NEUTROS ABS: 8.3 10*3/uL — AB (ref 1.7–7.7)
Neutrophils Relative %: 81 % — ABNORMAL HIGH (ref 43–77)
Platelets: 467 10*3/uL — ABNORMAL HIGH (ref 150–400)
RBC: 3.21 MIL/uL — ABNORMAL LOW (ref 3.87–5.11)
RDW: 20.2 % — ABNORMAL HIGH (ref 11.5–15.5)
WBC: 10.2 10*3/uL (ref 4.0–10.5)

## 2014-04-19 LAB — GLUCOSE, CAPILLARY
GLUCOSE-CAPILLARY: 172 mg/dL — AB (ref 70–99)
GLUCOSE-CAPILLARY: 202 mg/dL — AB (ref 70–99)
Glucose-Capillary: 124 mg/dL — ABNORMAL HIGH (ref 70–99)
Glucose-Capillary: 141 mg/dL — ABNORMAL HIGH (ref 70–99)
Glucose-Capillary: 172 mg/dL — ABNORMAL HIGH (ref 70–99)
Glucose-Capillary: 203 mg/dL — ABNORMAL HIGH (ref 70–99)

## 2014-04-19 MED ORDER — FENTANYL CITRATE 0.05 MG/ML IJ SOLN
25.0000 ug | Freq: Once | INTRAMUSCULAR | Status: AC
  Administered 2014-04-19: 25 ug via INTRAVENOUS

## 2014-04-19 MED ORDER — SODIUM CHLORIDE 0.9 % IV SOLN
500.0000 mg | Freq: Four times a day (QID) | INTRAVENOUS | Status: DC
  Administered 2014-04-19 – 2014-04-23 (×17): 500 mg via INTRAVENOUS
  Filled 2014-04-19 (×18): qty 500

## 2014-04-19 MED ORDER — FUROSEMIDE 10 MG/ML IJ SOLN
40.0000 mg | Freq: Three times a day (TID) | INTRAMUSCULAR | Status: AC
  Administered 2014-04-19: 40 mg via INTRAVENOUS
  Filled 2014-04-19: qty 4

## 2014-04-19 MED ORDER — MIDAZOLAM HCL 2 MG/2ML IJ SOLN
0.5000 mg | Freq: Once | INTRAMUSCULAR | Status: AC
  Administered 2014-04-19: 0.5 mg via INTRAVENOUS

## 2014-04-19 NOTE — Progress Notes (Signed)
ANTIBIOTIC CONSULT NOTE - INITIAL  Pharmacy Consult for Imipenem Indication: pneumonia  Allergies  Allergen Reactions  . Amoxicillin Hives  . Bactrim [Sulfamethoxazole-Tmp Ds] Hives  . Erythromycin Hives  . Keflex [Cephalexin] Hives  . Penicillins Itching and Swelling    Sweating    Patient Measurements: Height: 5\' 2"  (157.5 cm) Weight: 217 lb 6 oz (98.6 kg) IBW/kg (Calculated) : 50.1 Vital Signs: Temp: 98.2 F (36.8 C) (05/28 1117) Temp src: Oral (05/28 1117) BP: 101/54 mmHg (05/28 1117) Pulse Rate: 101 (05/28 1117) Intake/Output from previous day: 05/27 0701 - 05/28 0700 In: 1510 [I.V.:10; NG/GT:1100; IV Piggyback:400] Out: 250 [Urine:250] Intake/Output from this shift: Total I/O In: 500 [I.V.:20; NG/GT:480] Out: 275 [Urine:275]  Labs:  Recent Labs  04/17/14 0355 04/18/14 0500 04/19/14 0400  WBC 8.4 8.8 10.2  HGB 8.9* 8.8* 8.6*  PLT 438* PLATELET CLUMPS NOTED ON SMEAR, COUNT APPEARS ADEQUATE 467*  CREATININE 0.38* 0.43* 0.59   Estimated Creatinine Clearance: 85.1 ml/min (by C-G formula based on Cr of 0.59). No results found for this basename: VANCOTROUGH, Corlis Leak, VANCORANDOM, GENTTROUGH, GENTPEAK, GENTRANDOM, TOBRATROUGH, TOBRAPEAK, TOBRARND, AMIKACINPEAK, AMIKACINTROU, AMIKACIN,  in the last 72 hours   Microbiology: Recent Results (from the past 720 hour(s))  URINE CULTURE     Status: None   Collection Time    04/07/14 11:50 AM      Result Value Ref Range Status   Specimen Description URINE, CATHETERIZED   Final   Special Requests NONE   Final   Culture  Setup Time     Final   Value: 04/08/2014 01:35     Performed at [place]     Final   Value: NO GROWTH     Performed at [facility]   Culture     Final   Value: NO GROWTH     Performed at [facility]   Report Status 04/09/2014 FINAL   Final  CULTURE, BLOOD (ROUTINE X 2)     Status: None   Collection Time    04/07/14 12:02 PM      Result Value Ref  Range Status   Specimen Description BLOOD RIGHT ANTECUBITAL   Final   Special Requests BOTTLES DRAWN AEROBIC ONLY [facility]   Final   Culture  Setup Time     Final   Value: 04/08/2014 21:13     Performed at [facility]   Culture     Final   Value: STAPHYLOCOCCUS SPECIES (COAGULASE NEGATIVE)     Note: RIFAMPIN AND GENTAMICIN SHOULD NOT BE USED AS SINGLE DRUGS FOR TREATMENT OF STAPH INFECTIONS.     Note: Performed at [facility]     Performed at [facility]   Report Status 04/10/2014 FINAL   Final   Organism ID, Bacteria STAPHYLOCOCCUS SPECIES (COAGULASE NEGATIVE)   Final  CULTURE, BLOOD (ROUTINE X 2)     Status: None   Collection Time    04/07/14 12:02 PM      Result Value Ref Range Status   Specimen Description BLOOD RIGHT HAND   Final   Special Requests BOTTLES DRAWN AEROBIC AND ANAEROBIC 8CC   Final   Culture  Setup Time     Final   Value: 04/08/2014 21:16     Performed at [facility]   Culture     Final   Value: STAPHYLOCOCCUS SPECIES (COAGULASE NEGATIVE)     Note: SUSCEPTIBILITIES PERFORMED ON PREVIOUS CULTURE WITHIN THE LAST 5 DAYS.  Note: Performed at [facility] Gram Stain Report Called to,Read Back By and Verified With: STONE A 04/08/14 0811 BY THOMPSON S     Performed at [facility]   Report Status 04/10/2014 FINAL   Final  SURGICAL PCR SCREEN     Status: None   Collection Time    04/08/14  7:54 PM      Result Value Ref Range Status   MRSA, PCR NEGATIVE  NEGATIVE Final   Staphylococcus aureus NEGATIVE  NEGATIVE Final   Comment:            The Xpert SA Assay (FDA     approved for NASAL specimens     in patients over 13 years of age),     is one component of     a comprehensive surveillance     program.  Test performance has     been validated by [employer] for patients greater     than or equal to 29 year old.     It is not intended     to diagnose infection nor to     guide or monitor treatment.  CULTURE,  RESPIRATORY (NON-EXPECTORATED)     Status: None   Collection Time    04/16/14  6:06 PM      Result Value Ref Range Status   Specimen Description TRACHEAL ASPIRATE   Final   Special Requests NONE   Final   Gram Stain     Final   Value: FEW WBC PRESENT,BOTH PMN AND MONONUCLEAR     RARE SQUAMOUS EPITHELIAL CELLS PRESENT     RARE GRAM POSITIVE COCCI     IN PAIRS     Performed at [facility]   Culture     Final   Value: ABUNDANT GRAM NEGATIVE RODS     Performed at [facility]   Report Status PENDING   Incomplete  CULTURE, BLOOD (ROUTINE X 2)     Status: None   Collection Time    04/17/14  9:35 AM      Result Value Ref Range Status   Specimen Description BLOOD LEFT ANTECUBITAL   Final   Special Requests BOTTLES DRAWN AEROBIC ONLY 1CC   Final   Culture  Setup Time     Final   Value: 04/17/2014 14:03     Performed at [facility]   Culture     Final   Value: STAPHYLOCOCCUS SPECIES (COAGULASE NEGATIVE)     Note: Gram Stain Report Called to,Read Back By and Verified With: DARA ANDERSON ON 04/18/2014 AT 6:30P BY WILEJ     Performed at [facility]   Report Status PENDING   Incomplete  CULTURE, BLOOD (ROUTINE X 2)     Status: None   Collection Time    04/17/14  9:40 AM      Result Value Ref Range Status   Specimen Description BLOOD LEFT HAND   Final   Special Requests BOTTLES DRAWN AEROBIC ONLY 2CC   Final   Culture  Setup Time     Final   Value: 04/17/2014 14:03     Performed at [facility]   Culture     Final   Value: STAPHYLOCOCCUS SPECIES (COAGULASE NEGATIVE)     Note: RIFAMPIN AND GENTAMICIN SHOULD NOT BE USED AS SINGLE DRUGS FOR TREATMENT OF STAPH INFECTIONS.     Note: Gram Stain Report Called to,Read Back By and Verified With: Kevan Ny  ON 04/18/2014 AT 6:30P BY Dennard Nip     Performed at [facility]   Report Status PENDING   Incomplete  CULTURE, RESPIRATORY (NON-EXPECTORATED)     Status: None   Collection Time    04/17/14  11:55 AM      Result Value Ref Range Status   Specimen Description TRACHEAL ASPIRATE   Final   Special Requests Normal   Final   Gram Stain     Final   Value: ABUNDANT WBC PRESENT, PREDOMINANTLY PMN     RARE SQUAMOUS EPITHELIAL CELLS PRESENT     NO ORGANISMS SEEN     Performed at [facility]   Culture     Final   Value: ABUNDANT GRAM NEGATIVE RODS     Performed at [facility]   Report Status PENDING   Incomplete    Medical History: Past Medical History  Diagnosis Date  . Asthma   . COPD (chronic obstructive pulmonary disease)   . Hypothyroidism   . Essential hypertension, benign   . Hyperlipidemia   . Type 2 diabetes mellitus   . Atrial fibrillation   . MI (myocardial infarction)     2012  . Pericardial effusion 09/09/2013  . CHF (congestive heart failure)   . On home O2     4L N/C continuous  . Thoracic compression fracture 03/23/2014  . Ventral hernia     Medications:  Anti-infectives   Start     Dose/Rate Route Frequency Ordered Stop   04/19/14 1315  imipenem-cilastatin (PRIMAXIN) 500 mg in sodium chloride 0.9 % 100 mL IVPB     500 mg 200 mL/hr over 30 Minutes Intravenous 4 times per day 04/19/14 1311     04/13/14 1730  vancomycin (VANCOCIN) IVPB 1000 mg/200 mL premix     1,000 mg 200 mL/hr over 60 Minutes Intravenous Every 12 hours 04/13/14 0847     04/10/14 1730  vancomycin (VANCOCIN) IVPB 750 mg/150 ml premix  Status:  Discontinued     750 mg 150 mL/hr over 60 Minutes Intravenous Every 12 hours 04/10/14 1112 04/10/14 1116   04/10/14 1730  vancomycin (VANCOCIN) IVPB 750 mg/150 ml premix  Status:  Discontinued     750 mg 150 mL/hr over 60 Minutes Intravenous Every 12 hours 04/10/14 1136 04/13/14 0847   04/08/14 2317  bacitracin 50,000 Units in sodium chloride irrigation 0.9 % 500 mL irrigation  Status:  Discontinued       As needed 04/08/14 2317 04/09/14 0049   04/08/14 1630  vancomycin (VANCOCIN) IVPB 750 mg/150 ml premix  Status:  Discontinued      750 mg 150 mL/hr over 60 Minutes Intravenous Every 12 hours 04/08/14 1538 04/10/14 1111   04/08/14 1000  levofloxacin (LEVAQUIN) tablet 500 mg  Status:  Discontinued     500 mg Oral Daily 04/07/14 1636 04/10/14 1116   04/07/14 1615  levofloxacin (LEVAQUIN) tablet 500 mg  Status:  Discontinued     500 mg Oral Daily 04/07/14 1611 04/07/14 1635     Assessment: 89 YOF on vancomycin day #11 for CNS bacteremia. Now to start Imipenem for new GNR in tracheal aspirate. WBC wnl. Tmax 100.5. Note penicillin allergy and Keflex allergy. Called and discussed allergies with Dr. Titus Mould and ok to try Imipenem. Alerted RN to evaluate for any signs of allergic response.   5/16 Blood - GPC 2/2 (coag negative staph) - sensitive to Ancef, but allergic 5/25 Trach asp >> reincubated 5/26 Repeat Blood - CNS 5/26 Lurline Idol  asp - abundant GNR  Goal of Therapy:  Clinical resolution of infection  Plan:  1. Imipenem 500mg  IV q6h. 2. Follow-up renal function and follow-up culture results.  3. Monitor for signs of reaction and adjust therapy if needed.  Sloan Leiter, PharmD, BCPS Clinical Pharmacist [phone] 04/19/2014,1:13 PM

## 2014-04-19 NOTE — Progress Notes (Signed)
Occupational Therapy Discharge Patient Details Name: [name] MRN: [id number] DOB: 1955-12-22 Today's Date: 04/19/2014 Time:  -     Patient discharged from OT services secondary to medical decline - will need to re-order OT to resume therapy services. Pt currently on vent with new medical changes since order on 04/09/14. Please reorder Ot when appropriate.   Please see latest therapy progress note for current level of functioning and progress toward goals.    Progress and discharge plan discussed with patient and/or caregiver: Patient unable to participate in discharge planning and no caregivers available  GO     Peri Maris Pager: [phone]  04/19/2014, 8:06 AM

## 2014-04-19 NOTE — Progress Notes (Signed)
Central line placement being performed by Montey Hora, PA at this time.  Patient placed in supine position in slight trendelenburg.  Patient prepped and draped using sterile technique.  Sedation given at 1757.  Procedure started at [place].  Patient's vitals are BP 96/41, HR 85, O2 95, RR 17.  Sterility was maintained throughout the procedure.  Patient tolerated procedure well.   Procedure stopped at 1807.  Patient's vitals are BP 108/77, HR 88, O2 92, RR 14.  Patient remains stable at this time.  Will continue to monitor.

## 2014-04-19 NOTE — Progress Notes (Signed)
RT came in patients room to do assessment.  Patient was very sleepy.  BBS were very decreased.  Per RN patient had been given sedation medications earlier for a procedure.  RT called [facility] and spoke with Dr Nelda Marseille about placing patient back on ventilator until sedation medications wore off and patient was more awake and alert.  Dr Nelda Marseille was ok with this plan.  RT will continue to monitor.

## 2014-04-19 NOTE — Progress Notes (Signed)
  Echocardiogram 2D Echocardiogram has been performed.  Joanna Perez 04/19/2014, 5:00 PM

## 2014-04-19 NOTE — Progress Notes (Signed)
PULMONARY / CRITICAL CARE MEDICINE  Name: Joanna Perez MRN: [id number] DOB: [birth year]    ADMISSION DATE:  04/07/2014 CONSULTATION DATE:  04/09/2014  REFERRING MD :  Dr. Annette Stable PRIMARY SERVICE: PCCM  CHIEF COMPLAINT:  Paraplegia   BRIEF PATIENT DESCRIPTION: 58 yo with COPD, CHF, DM, CAD s/p MI, atrial fibrillation transferred from [facility] for BLE paraplegia, found to have T6 fracture with spinal stenosis and spinal cord compression on MRI. She was taken for decompressive surgery and remained on mechanical ventilation post surgery.   SIGNIFICANT EVENTS / STUDIES:  5/16  Admitted to [facility], hypotensive and with new paraplegia 5/17  Transfer to [facility] 5/17  MR Cervical/Thoracic/Lumbar Spine - worsened T6 compression fracture with paraspinal and epidural hematoma causing cord compression and edema, possible early cord infarct 5/18  Intubation for procedure 5/18  Thoracic laminectomy 5/18  Extubated 5/19  Somnolent / hypercarbic >>> reintubated 5/26  Tracheostomy  LINES / TUBES: ETT 5/18 >>> 5/18; 5/19 >>>5/26 Trach 5/26 (df) >>> R R AL 5/17 >>> ??? Foley 5/16 >>> R PICC 5/22 >>> 5/28  CULTURES: 5/16  Blood >>>  COAG NEG STAPH 5/16  Urine >>> neg 5/26  Sputum >>> GNR >>> 5/26  Blood >>> COAG NEG STAPH  ANTIBIOTICS:  Vancomycin 5/17 >>> Levaquin 5/17 >>> 5/19 Imipenem 5/28 >>>  INTERVAL HISTORY: Repeated blood cultures positive   VITAL SIGNS: Temp:  [98.2 F (36.8 C)-100.5 F (38.1 C)] 98.2 F (36.8 C) (05/28 1117) Pulse Rate:  [82-108] 101 (05/28 1117) Resp:  [12-23] 23 (05/28 1117) BP: (100-138)/(46-64) 114/60 mmHg (05/28 1313) SpO2:  [90 %-97 %] 97 % (05/28 1313) FiO2 (%):  [40 %-60 %] 60 % (05/28 1313) Weight:  [98.6 kg (217 lb 6 oz)] 98.6 kg (217 lb 6 oz) (05/28 0442)  HEMODYNAMICS: CVP:  [6 mmHg-13 mmHg] 13 mmHg  VENTILATOR SETTINGS: Vent Mode:  [-] PRVC FiO2 (%):  [40 %-60 %] 60 % Set Rate:  [20 bmp] 20 bmp Vt Set:  [400 mL] 400 mL PEEP:  [5  cmH20] 5 cmH20 Plateau Pressure:  [21 cmH20-24 cmH20] 24 cmH20  INTAKE / OUTPUT: Intake/Output     05/27 0701 - 05/28 0700 05/28 0701 - 05/29 0700   I.V. (mL/kg) 10 (0.1) 20 (0.2)   Other     NG/GT 1100 480   IV Piggyback 400    Total Intake(mL/kg) 1510 (15.3) 500 (5.1)   Urine (mL/kg/hr) 250 (0.1) 275 (0.4)   Total Output 250 275   Net +1260 +225        Urine Occurrence 1 x    Stool Occurrence 1 x      PHYSICAL EXAMINATION: General:  No distress while on trach collar Neuro:  Awake, alery, nonverbal,m does not move lower extremities HEENT:  Tracheostomy site intact Cardiovascular:  Regular, no murmurs Lungs:  Bilateral air entry / rhonchi Abdomen:  Soft, bowel sounds diminished, mild distention, reducible ventral hernia Musculoskeletal:  No edema Skin:  No rash  LABS:  CBC  Recent Labs Lab 04/17/14 0355 04/18/14 0500 04/19/14 0400  WBC 8.4 8.8 10.2  HGB 8.9* 8.8* 8.6*  HCT 29.6* 29.8* 28.6*  PLT 438* PLATELET CLUMPS NOTED ON SMEAR, COUNT APPEARS ADEQUATE 467*   Coag's  Recent Labs Lab 04/17/14 0355  INR 1.11   BMET  Recent Labs Lab 04/17/14 0355 04/18/14 0500 04/19/14 0400  NA 146 142 143  K 4.7 4.0 4.2  CL 103 96 98  CO2 37* 37* 38*  BUN  24* 26* 37*  CREATININE 0.38* 0.43* 0.59  GLUCOSE 175* 218* 119*   Electrolytes  Recent Labs Lab 04/17/14 0355 04/18/14 0500 04/19/14 0400  CALCIUM 9.4 9.1 8.9   Sepsis Markers No results found for this basename: LATICACIDVEN, PROCALCITON, O2SATVEN,  in the last 168 hours ABG  Recent Labs Lab 04/16/14 1600  PHART 7.322*  PCO2ART 74.0*  PO2ART 78.0*   Liver Enzymes  Recent Labs Lab 04/18/14 0500  AST 169*  ALT 126*  ALKPHOS 64  BILITOT 0.3  ALBUMIN 1.6*   Cardiac Enzymes No results found for this basename: TROPONINI, PROBNP,  in the last 168 hours Glucose  Recent Labs Lab 04/18/14 1611 04/18/14 2051 04/18/14 2333 04/19/14 0346 04/19/14 0803 04/19/14 1120  GLUCAP 222* 184* 133*  124* 141* 172*   IMAGING: Dg Chest Port 1 View  04/18/2014   CLINICAL DATA:  Evaluate infiltrates and tracheostomy  EXAM: PORTABLE CHEST - 1 VIEW  COMPARISON:  04/17/2014  FINDINGS: Lung base opacity has improved. There is mild persistent lung base opacity that is likely atelectasis. No pulmonary edema.  Cardiac silhouette is mildly enlarged. Tracheostomy tube, enteric tube and right PICC are stable and well positioned.  IMPRESSION: 1. Improved lung aeration.  Mild persistent basilar atelectasis.   Electronically Signed   By: Lajean Manes M.D.   On: 04/18/2014 08:00   Dg Abd Portable 1v  04/18/2014   CLINICAL DATA:  Abdominal distention and pain, nausea.  EXAM: PORTABLE ABDOMEN - 1 VIEW  COMPARISON:  Apr 10, 2014.  FINDINGS: Feeding tube tip is seen in the mid portion of the stomach. No abnormal bowel gas pattern is noted. No abnormal calcifications are noted.  IMPRESSION: Feeding tube tip seen in stomach.   Electronically Signed   By: Sabino Dick M.D.   On: 04/18/2014 10:07   ASSESSMENT / PLAN:  PULMONARY A: Acute on chronic respiratory failure Respiratory muscle weakness secondary to cord compression / new T6 paraplegia  COPD with possible exacerbation Suspect OSA / OHS Tracheostomy status P:    Goal SpO2>92 Trach collar as tolerated Mechanical ventilatory support PRN DuoNeb / Albuterol / Pulmicort Prednisone to complete 14 days SLP eval for PMV  CARDIOVASCULAR A:  Chronic diastolic heart failure Dyslipidemia P:  Statin held secondary to elevated transaminases  RENAL A:   Stay balance positive ~ 3.5L P:   Trend BMP Lasix increase 40 mg IV q8h  GASTROINTESTINAL A:   Nutrition GERD P:   Protonix as preadmission TF SLP eval  HEMATOLOGIC A:   Anemia VTE Px P:  Trend CBC SCD  INFECTIOUS A:   Bacteremia with COAG NEG STAPH ( in spite of Vancomycin ) Endocarditis? GNR in sputum ( Pseudomonas? ) Allergy PCN / cephalosporins P:   Meropenem added Continue  Vancomycin Remove PICC Place CVL May need ID consult  ENDOCRINE  A:   Hyperglycemia Hypothyroidism P:   SSI Lantus 15 Synthroid  NEUROLOGIC A:   T6 cord compression / paraplegia, s/p spinal cord decompression, still no LE movement Delirium P:   Fentanyl PRN Versed PRN Celexa, Xanax as preadmission  I have personally obtained history, examined patient, evaluated and interpreted laboratory and imaging results, reviewed medical records, formulated assessment / plan and placed orders.  Doree Fudge, MD Pulmonary [facility] [place] Pager: [phone]  04/19/2014, 3:19 PM

## 2014-04-19 NOTE — Procedures (Signed)
Central Venous Catheter Insertion Procedure Note DIAMON REDDINGER [id number] [birth year]  Procedure: Insertion of Central Venous Catheter Indications: Assessment of intravascular volume, Drug and/or fluid administration and Frequent blood sampling  Procedure Details Consent: Risks of procedure as well as the alternatives and risks of each were explained to the (patient/caregiver).  Consent for procedure obtained. Time Out: Verified patient identification, verified procedure, site/side was marked, verified correct patient position, special equipment/implants available, medications/allergies/relevent history reviewed, required imaging and test results available.  Performed  Maximum sterile technique was used including antiseptics, cap, gloves, gown, hand hygiene, mask and sheet. Skin prep: Chlorhexidine; local anesthetic administered A antimicrobial bonded/coated triple lumen catheter was placed in the left internal jugular vein using the Seldinger technique.  Evaluation Blood flow good Complications: No apparent complications Patient did tolerate procedure well. Chest X-ray ordered to verify placement.  CXR: pending.  Procedure performed under direct ultrasound guidance for real time vessel cannulation.      Montey Hora, [place] [facility] Pgr: [phone]  or [phone]

## 2014-04-19 NOTE — Progress Notes (Signed)
Clinical Social Work Department BRIEF PSYCHOSOCIAL ASSESSMENT 04/19/2014  Patient:  Joanna Perez, Joanna Perez     Account Number:  [id number]     Admit date:  04/07/2014  Clinical Social Worker:  Freeman Caldron  Date/Time:  04/19/2014 02:41 PM  Referred by:  Physician  Date Referred:  04/19/2014 Referred for  SNF Placement   Other Referral:   Interview type:  Patient Other interview type:    PSYCHOSOCIAL DATA Living Status:  FAMILY Admitted from facility:   Level of care:   Primary support name:  Martasia Talamante ([phone]) Primary support relationship to patient:  SPOUSE Degree of support available:   Good--pt lived with spouse prior to hospitalization.    CURRENT CONCERNS Current Concerns  Post-Acute Placement   Other Concerns:    SOCIAL WORK ASSESSMENT / PLAN CSW spoke with pt concerning placement needs, as MD believes pt will not be able to wean completely from vent. Pt is Medicaid-only, and placements are limited. CSW explained only 2 facilities that take pt's insurance are in [place] and [place]. CSW stated next-closest is in [place], [place]. CSW asked pt if she would like CSW to call husband and inform him. Pt nodded. CSW left voicemail for pt's husband and requested return call. Referrals made to vent SNF in [place], [place], and [place].   Assessment/plan status:  Psychosocial Support/Ongoing Assessment of Needs Other assessment/ plan:   Information/referral to community resources:   vent SNF    PATIENT'S/FAMILY'S RESPONSE TO PLAN OF CARE: Good--pt nods appropriately and understands conversation.       Ky Barban, MSW, [facility] Clinical Social Worker [phone]

## 2014-04-19 NOTE — Procedures (Signed)
Supervised and presented through the entire procedure.  Doree Fudge, MD Pulmonary and [place] Pager: [phone]

## 2014-04-20 DIAGNOSIS — J40 Bronchitis, not specified as acute or chronic: Secondary | ICD-10-CM

## 2014-04-20 DIAGNOSIS — B957 Other staphylococcus as the cause of diseases classified elsewhere: Secondary | ICD-10-CM

## 2014-04-20 DIAGNOSIS — Z930 Tracheostomy status: Secondary | ICD-10-CM

## 2014-04-20 LAB — CULTURE, BLOOD (ROUTINE X 2)

## 2014-04-20 LAB — BASIC METABOLIC PANEL
BUN: 59 mg/dL — AB (ref 6–23)
CHLORIDE: 99 meq/L (ref 96–112)
CO2: 36 mEq/L — ABNORMAL HIGH (ref 19–32)
CREATININE: 0.76 mg/dL (ref 0.50–1.10)
Calcium: 8.8 mg/dL (ref 8.4–10.5)
GFR calc Af Amer: >90 mL/min (ref >90)
GFR calc non Af Amer: >90 mL/min (ref >90)
GLUCOSE: 143 mg/dL — AB (ref 70–99)
Potassium: 4.2 mEq/L (ref 3.7–5.3)
Sodium: 143 mEq/L (ref 137–147)

## 2014-04-20 LAB — BLOOD GAS, ARTERIAL
ACID-BASE EXCESS: 8.6 mmol/L — AB (ref 0.0–2.0)
Bicarbonate: 35.2 mEq/L — ABNORMAL HIGH (ref 20.0–24.0)
DRAWN BY: 277331
FIO2: 0.6 %
O2 CONTENT: 10 L/min
O2 SAT: 92.9 %
Patient temperature: 98.6
TCO2: 37.5 mmol/L (ref 0–100)
pCO2 arterial: 76.4 mmHg (ref 35.0–45.0)
pH, Arterial: 7.286 — ABNORMAL LOW (ref 7.350–7.450)
pO2, Arterial: 72.6 mmHg — ABNORMAL LOW (ref 80.0–100.0)

## 2014-04-20 LAB — GLUCOSE, CAPILLARY
GLUCOSE-CAPILLARY: 127 mg/dL — AB (ref 70–99)
GLUCOSE-CAPILLARY: 159 mg/dL — AB (ref 70–99)
GLUCOSE-CAPILLARY: 155 mg/dL — AB (ref 70–99)
Glucose-Capillary: 217 mg/dL — ABNORMAL HIGH (ref 70–99)
Glucose-Capillary: 176 mg/dL — ABNORMAL HIGH (ref 70–99)

## 2014-04-20 LAB — CBC
HEMATOCRIT: 30.5 % — AB (ref 36.0–46.0)
HEMOGLOBIN: 8.9 g/dL — AB (ref 12.0–15.0)
MCH: 26.2 pg (ref 26.0–34.0)
MCHC: 29.2 g/dL — ABNORMAL LOW (ref 30.0–36.0)
MCV: 89.7 fL (ref 78.0–100.0)
Platelets: 504 10*3/uL — ABNORMAL HIGH (ref 150–400)
RBC: 3.4 MIL/uL — ABNORMAL LOW (ref 3.87–5.11)
RDW: 19.8 % — ABNORMAL HIGH (ref 11.5–15.5)
WBC: 14.7 10*3/uL — ABNORMAL HIGH (ref 4.0–10.5)

## 2014-04-20 MED ORDER — FUROSEMIDE 10 MG/ML IJ SOLN: 40.0000 mg | Freq: Three times a day (TID) | INTRAMUSCULAR | Status: DC

## 2014-04-20 MED ORDER — FUROSEMIDE 10 MG/ML IJ SOLN
40.0000 mg | Freq: Four times a day (QID) | INTRAMUSCULAR | Status: DC
  Administered 2014-04-20 – 2014-04-21 (×4): 40 mg via INTRAVENOUS
  Filled 2014-04-20 (×7): qty 4

## 2014-04-20 NOTE — Progress Notes (Signed)
Contacted IV team to have PICC removed per MD order dated 04.28.15.  New lines hung and new CVP in place via [facility].  Pt currently resting with eyes closed - when she does awake, she continues being very wide-eyed and anxious - pt reoriented and emotional support provided.  Pt enjoys watching TLC so channel changed per her request.  Will continue to closely monitor.

## 2014-04-20 NOTE — Progress Notes (Signed)
Dr Kieth Brightly notified of pt's ABG results:    CO2 76 pH 7.28 PO2 72   And that RT advised that she would be placing the pt back on the vent.  No new orders.  Pt currently resting with eyes closed - mouths that she is "better".  Will continue to closely monitor.

## 2014-04-20 NOTE — Progress Notes (Signed)
**Note De-Identified Ilah Boule Obfuscation** RT note: ABG was collected and CO2 was 42 RN notified and patient was placed on full ventilatory support from 60% ATC; patient had laboured abdominal breathing and complaining that her stomach was hurting. RT continue to monitor

## 2014-04-20 NOTE — Progress Notes (Signed)
NUTRITION FOLLOW UP  DOCUMENTATION CODES Per approved criteria  -Obesity Unspecified   INTERVENTION: Continue current TF regimen RD to follow for nutrition care plan  NUTRITION DIAGNOSIS: Inadequate oral intake related to inability to eat as evidenced by NPO status, ongoing  Goal: Enteral nutrition to provide 60-70% of estimated calorie needs (22-25 kcals/kg ideal body weight) and 100% of estimated protein needs, based on ASPEN guidelines for permissive underfeeding in critically ill obese individuals, met  Monitor:  TF regimen & tolerance, respiratory status, weight, labs, I/O's  ASSESSMENT: Pt admitted with altered mental status and COPD exacerbation. Pt is status post thoracic decompressive surgery for severe myelopathy with subacute paraplegia on 5/18. Per MD note prognosis for neurological recovery is very poor.   Patient transferred to 2C-Stepdown from 16M-Neuro ICU 5/27.  Patient is currently on ventilator support via trach MV: 10.2 L/min Temp (24hrs), Avg:98.6 F (37 C), Min:98.2 F (36.8 C), Max:99.7 F (37.6 C)   Passy-Muir Speaking Valve - Evaluation 5/27.  CVC insertion 5/28.  Vital HP formula continues to infuse at 50 ml/hr via NGT (tip in mid portion of stomach) providing 1200 kcal (24 kcal/kg IBW), 105 grams of protein (100% of estimated protein needs) and 1003 ml of water.  Height: Ht Readings from Last 1 Encounters:  04/09/14 $RemoveB'5\' 2"'klUrFISF$  (1.575 m)    Weight -----> stable Wt Readings from Last 1 Encounters:  04/20/14 224 lb 6.9 oz (101.8 kg)    5/28  217 lb 5/27  211 lb 5/26  207 lb 5/25  212 lb 5/24  212 lb 5/23  210 lb 5/22  210 lb 5/21  209 lb 5/18  206 lb 5/17  198 lb  BMI:  Body mass index is 41.04 kg/(m^2).  Re-estimated needs: Kcal: 1879 Protein: 100-110 gm Fluid: per MD   Skin: stage II pressure ulcer on sacrum  Diet Order: NPO   Intake/Output Summary (Last 24 hours) at 04/20/14 1257 Last data filed at 04/20/14 1200  Gross per 24  hour  Intake   1420 ml  Output    450 ml  Net    970 ml    Labs:   Recent Labs Lab 04/18/14 0500 04/19/14 0400 04/20/14 0500  NA 142 143 143  K 4.0 4.2 4.2  CL 96 98 99  CO2 37* 38* 36*  BUN 26* 37* 59*  CREATININE 0.43* 0.59 0.76  CALCIUM 9.1 8.9 8.8  GLUCOSE 218* 119* 143*    CBG (last 3)   Recent Labs  04/20/14 0442 04/20/14 0757 04/20/14 1121  GLUCAP 127* 159* 217*    Scheduled Meds: . ALPRAZolam  1 mg Oral TID  . antiseptic oral rinse  15 mL Mouth Rinse QID  . budesonide (PULMICORT) nebulizer solution  0.5 mg Nebulization BID  . chlorhexidine  15 mL Mouth Rinse BID  . cholecalciferol  400 Units Oral Daily  . citalopram  20 mg Oral Daily  . feeding supplement (VITAL HIGH PROTEIN)  1,000 mL Per Tube Q24H  . guaiFENesin  600 mg Oral Daily  . imipenem-cilastatin  500 mg Intravenous 4 times per day  . insulin aspart  0-15 Units Subcutaneous 6 times per day  . insulin glargine  15 Units Subcutaneous BID  . ipratropium-albuterol  3 mL Nebulization BID  . levothyroxine  112 mcg Oral QAC breakfast  . pantoprazole sodium  40 mg Per Tube Daily  . predniSONE  10 mg Oral Q breakfast  . senna  1 tablet Oral BID  . sodium  chloride  10-40 mL Intracatheter Q12H  . vancomycin  1,000 mg Intravenous Q12H  . vecuronium  10 mg Intravenous Once    Continuous Infusions:   Arthur Holms, RD, LDN Pager #: [phone] After-Hours Pager #: [phone]

## 2014-04-20 NOTE — Consult Note (Signed)
[facility] for Infectious Disease  Date of Admission:  04/07/2014  Date of Consult:  04/20/2014  Reason for Consult: MSSE Bacteremia Referring Physician: Zubelevitskiy  Impression/Recommendation MSSE Bacteremia T6 laminectomy Tracheobronchitis  S maltophila  Would Pull PIC Keep IJ for now Await sensi of S maltophila repeat BCx after PIC pulled Consider TEE Contact isolation for MDR stenotrophomonas  Comment Her persistent bacteremia suggests undrained source or persistent source. Her PIC may be the issue. If she has more fever, BCx still+, consider MRI of back (though hard to interpret with recent surgery). Lastly, the significance of her Stenotrophomonas is unlcear- her CXR does not show infiltrate (basilar atx).   Thank you so much for this interesting consult,   Campbell Riches (pager) [phone] [email or web address]  Joanna Perez is an 58 y.o. female.  HPI: 58 yo F with hx of COPD, afib and multiple admissions to hospital in last 6 months. She was found by her husband on 5-16 to be unresponsive. She was admitted, drowsy, hypotensive. Her anticoagulants were held, and she was given stress dose steroids. After 24h the patient was more awake but stated she was unable to move or feel her legs as well as loss of bladder control. She was found on MRI to have a T6 compression fracture and stenosis. She was also felt to have an dorsal epidural hematoma. She was started on vanco/levaquin on 5-17. On 5-18 she underwent laminectomy. She was able to be extubated in 24h. By 5-19 she required re-intubation after pH was 7.1 and she was unresponsive. Steroids were added on 5-22.  By 5-23 she developed fever 101.8. Her WBC was normal. She continued to have low grade temps (< 101). She had repeat BCx on 5-26 that again showed CNS on vancomycin. Merrem was added.  She underwent tracheostomy on 5-26. Triple lumen catheter placed 5-28.    She has been afebrile since 5-28.   BCx  2/2 5-16 MSSE BCx 2/2 5-26 MSSE Resp Cx 5-26 S maltophila (R- levaquin, R- bactrim)  Past Medical History  Diagnosis Date  . Asthma   . COPD (chronic obstructive pulmonary disease)   . Hypothyroidism   . Essential hypertension, benign   . Hyperlipidemia   . Type 2 diabetes mellitus   . Atrial fibrillation   . MI (myocardial infarction)     2012  . Pericardial effusion 09/09/2013  . CHF (congestive heart failure)   . On home O2     4L N/C continuous  . Thoracic compression fracture 03/23/2014  . Ventral hernia     Past Surgical History  Procedure Laterality Date  . Hernia repair    . Abdominal hysterectomy    . Stomach surgery      Wound vac currently in place  . Esophagogastroduodenoscopy  11/11/2011    Procedure: ESOPHAGOGASTRODUODENOSCOPY (EGD);  Surgeon: Rogene Houston, MD;  Location: [facility];  Service: Endoscopy;  Laterality: N/A;  . Foreign body removal  11/11/2011    Procedure: FOREIGN BODY REMOVAL;  Surgeon: Rogene Houston, MD;  Location: [facility];  Service: Endoscopy;  Laterality: N/A;  . Tracheal surgery    . Abdominal surgery    . Laminectomy N/A 04/08/2014    Procedure: THORACIC LAMINECTOMY ;  Surgeon: Charlie Pitter, MD;  Location: [facility];  Service: Neurosurgery;  Laterality: N/A;  . Tracheostomy  04/17/14    Titus Mould     Allergies  Allergen Reactions  . Amoxicillin Hives  . Bactrim [Sulfamethoxazole-Tmp Ds]  Hives  . Erythromycin Hives  . Keflex [Cephalexin] Hives  . Penicillins Itching and Swelling    Sweating    Medications:  Scheduled: . ALPRAZolam  1 mg Oral TID  . antiseptic oral rinse  15 mL Mouth Rinse QID  . budesonide (PULMICORT) nebulizer solution  0.5 mg Nebulization BID  . chlorhexidine  15 mL Mouth Rinse BID  . cholecalciferol  400 Units Oral Daily  . citalopram  20 mg Oral Daily  . feeding supplement (VITAL HIGH PROTEIN)  1,000 mL Per Tube Q24H  . guaiFENesin  600 mg Oral Daily  . imipenem-cilastatin  500 mg  Intravenous 4 times per day  . insulin aspart  0-15 Units Subcutaneous 6 times per day  . insulin glargine  15 Units Subcutaneous BID  . ipratropium-albuterol  3 mL Nebulization BID  . levothyroxine  112 mcg Oral QAC breakfast  . pantoprazole sodium  40 mg Per Tube Daily  . predniSONE  10 mg Oral Q breakfast  . senna  1 tablet Oral BID  . sodium chloride  10-40 mL Intracatheter Q12H  . vancomycin  1,000 mg Intravenous Q12H  . vecuronium  10 mg Intravenous Once    Abtx:  Anti-infectives   Start     Dose/Rate Route Frequency Ordered Stop   04/19/14 1315  imipenem-cilastatin (PRIMAXIN) 500 mg in sodium chloride 0.9 % 100 mL IVPB     500 mg 200 mL/hr over 30 Minutes Intravenous 4 times per day 04/19/14 1311     04/13/14 1730  vancomycin (VANCOCIN) IVPB 1000 mg/200 mL premix     1,000 mg 200 mL/hr over 60 Minutes Intravenous Every 12 hours 04/13/14 0847     04/10/14 1730  vancomycin (VANCOCIN) IVPB 750 mg/150 ml premix  Status:  Discontinued     750 mg 150 mL/hr over 60 Minutes Intravenous Every 12 hours 04/10/14 1112 04/10/14 1116   04/10/14 1730  vancomycin (VANCOCIN) IVPB 750 mg/150 ml premix  Status:  Discontinued     750 mg 150 mL/hr over 60 Minutes Intravenous Every 12 hours 04/10/14 1136 04/13/14 0847   04/08/14 2317  bacitracin 50,000 Units in sodium chloride irrigation 0.9 % 500 mL irrigation  Status:  Discontinued       As needed 04/08/14 2317 04/09/14 0049   04/08/14 1630  vancomycin (VANCOCIN) IVPB 750 mg/150 ml premix  Status:  Discontinued     750 mg 150 mL/hr over 60 Minutes Intravenous Every 12 hours 04/08/14 1538 04/10/14 1111   04/08/14 1000  levofloxacin (LEVAQUIN) tablet 500 mg  Status:  Discontinued     500 mg Oral Daily 04/07/14 1636 04/10/14 1116   04/07/14 1615  levofloxacin (LEVAQUIN) tablet 500 mg  Status:  Discontinued     500 mg Oral Daily 04/07/14 1611 04/07/14 1635      Total days of antibiotics 12 vanco 2 merrem          Social History:  reports  that she quit smoking about 4 months ago. Her smoking use included Cigarettes. She has a 52.5 pack-year smoking history. She has never used smokeless tobacco. She reports that she drinks alcohol. She reports that she does not use illicit drugs.  Family History  Problem Relation Age of Onset  . Diabetes Mother     General ROS: per nursing- scant ET secretions, semiformed BM, see HPI (pt on vent)  Blood pressure 107/49, pulse 87, temperature 98.2 F (36.8 C), temperature source Oral, resp. rate 24, height $RemoveBe'5\' 2"'oUeXSpgVv$  (1.575 m),  weight 101.8 kg (224 lb 6.9 oz), SpO2 97.00%. General appearance: alert and no distress Eyes: pupils = Neck: supple, symmetrical, trachea midline and ET site clean. Lungs: rhonchi bilaterally Heart: regular rate and rhythm Abdomen: normal findings: bowel sounds normal and soft, non-tender and semi-formed BM Extremities: edema trace and RUE PIC. 3 pedal pulses L neck IJ Back wound is well healed, no d/c.    Results for orders placed during the hospital encounter of 04/07/14 (from the past 48 hour(s))  GLUCOSE, CAPILLARY     Status: Abnormal   Collection Time    04/18/14  4:11 PM      Result Value Ref Range   Glucose-Capillary 222 (*) 70 - 99 mg/dL  GLUCOSE, CAPILLARY     Status: Abnormal   Collection Time    04/18/14  8:51 PM      Result Value Ref Range   Glucose-Capillary 184 (*) 70 - 99 mg/dL  GLUCOSE, CAPILLARY     Status: Abnormal   Collection Time    04/18/14 11:33 PM      Result Value Ref Range   Glucose-Capillary 133 (*) 70 - 99 mg/dL   Comment 1 Documented in Chart     Comment 2 Notify RN    GLUCOSE, CAPILLARY     Status: Abnormal   Collection Time    04/19/14  3:46 AM      Result Value Ref Range   Glucose-Capillary 124 (*) 70 - 99 mg/dL   Comment 1 Documented in Chart     Comment 2 Notify RN    BASIC METABOLIC PANEL     Status: Abnormal   Collection Time    04/19/14  4:00 AM      Result Value Ref Range   Sodium 143  137 - 147 mEq/L    Potassium 4.2  3.7 - 5.3 mEq/L   Chloride 98  96 - 112 mEq/L   CO2 38 (*) 19 - 32 mEq/L   Glucose, Bld 119 (*) 70 - 99 mg/dL   BUN 37 (*) 6 - 23 mg/dL   Creatinine, Ser 0.59  0.50 - 1.10 mg/dL   Calcium 8.9  8.4 - 10.5 mg/dL   GFR calc non Af Amer >90  >90 mL/min   GFR calc Af Amer >90  >90 mL/min   Comment: (NOTE)     The eGFR has been calculated using the CKD EPI equation.     This calculation has not been validated in all clinical situations.     eGFR's persistently <90 mL/min signify possible Chronic Kidney     Disease.  CBC WITH DIFFERENTIAL     Status: Abnormal   Collection Time    04/19/14  4:00 AM      Result Value Ref Range   WBC 10.2  4.0 - 10.5 K/uL   RBC 3.21 (*) 3.87 - 5.11 MIL/uL   Hemoglobin 8.6 (*) 12.0 - 15.0 g/dL   HCT 28.6 (*) 36.0 - 46.0 %   MCV 89.1  78.0 - 100.0 fL   MCH 26.8  26.0 - 34.0 pg   MCHC 30.1  30.0 - 36.0 g/dL   RDW 20.2 (*) 11.5 - 15.5 %   Platelets 467 (*) 150 - 400 K/uL   Neutrophils Relative % 81 (*) 43 - 77 %   Neutro Abs 8.3 (*) 1.7 - 7.7 K/uL   Lymphocytes Relative 12  12 - 46 %   Lymphs Abs 1.2  0.7 - 4.0 K/uL   Monocytes Relative  7  3 - 12 %   Monocytes Absolute 0.7  0.1 - 1.0 K/uL   Eosinophils Relative 0  0 - 5 %   Eosinophils Absolute 0.0  0.0 - 0.7 K/uL   Basophils Relative 0  0 - 1 %   Basophils Absolute 0.0  0.0 - 0.1 K/uL  GLUCOSE, CAPILLARY     Status: Abnormal   Collection Time    04/19/14  8:03 AM      Result Value Ref Range   Glucose-Capillary 141 (*) 70 - 99 mg/dL  GLUCOSE, CAPILLARY     Status: Abnormal   Collection Time    04/19/14 11:20 AM      Result Value Ref Range   Glucose-Capillary 172 (*) 70 - 99 mg/dL  GLUCOSE, CAPILLARY     Status: Abnormal   Collection Time    04/19/14  3:58 PM      Result Value Ref Range   Glucose-Capillary 203 (*) 70 - 99 mg/dL   Comment 1 Notify RN    GLUCOSE, CAPILLARY     Status: Abnormal   Collection Time    04/19/14  8:37 PM      Result Value Ref Range   Glucose-Capillary  202 (*) 70 - 99 mg/dL  GLUCOSE, CAPILLARY     Status: Abnormal   Collection Time    04/19/14 11:41 PM      Result Value Ref Range   Glucose-Capillary 172 (*) 70 - 99 mg/dL  GLUCOSE, CAPILLARY     Status: Abnormal   Collection Time    04/20/14  4:42 AM      Result Value Ref Range   Glucose-Capillary 127 (*) 70 - 99 mg/dL  CBC     Status: Abnormal   Collection Time    04/20/14  5:00 AM      Result Value Ref Range   WBC 14.7 (*) 4.0 - 10.5 K/uL   RBC 3.40 (*) 3.87 - 5.11 MIL/uL   Hemoglobin 8.9 (*) 12.0 - 15.0 g/dL   HCT 30.5 (*) 36.0 - 46.0 %   MCV 89.7  78.0 - 100.0 fL   MCH 26.2  26.0 - 34.0 pg   MCHC 29.2 (*) 30.0 - 36.0 g/dL   RDW 19.8 (*) 11.5 - 15.5 %   Platelets 504 (*) 150 - 400 K/uL  BASIC METABOLIC PANEL     Status: Abnormal   Collection Time    04/20/14  5:00 AM      Result Value Ref Range   Sodium 143  137 - 147 mEq/L   Potassium 4.2  3.7 - 5.3 mEq/L   Chloride 99  96 - 112 mEq/L   CO2 36 (*) 19 - 32 mEq/L   Glucose, Bld 143 (*) 70 - 99 mg/dL   BUN 59 (*) 6 - 23 mg/dL   Creatinine, Ser 0.76  0.50 - 1.10 mg/dL   Calcium 8.8  8.4 - 10.5 mg/dL   GFR calc non Af Amer >90  >90 mL/min   GFR calc Af Amer >90  >90 mL/min   Comment: (NOTE)     The eGFR has been calculated using the CKD EPI equation.     This calculation has not been validated in all clinical situations.     eGFR's persistently <90 mL/min signify possible Chronic Kidney     Disease.  GLUCOSE, CAPILLARY     Status: Abnormal   Collection Time    04/20/14  7:57 AM      Result  Value Ref Range   Glucose-Capillary 159 (*) 70 - 99 mg/dL  BLOOD GAS, ARTERIAL     Status: Abnormal   Collection Time    04/20/14  9:45 AM      Result Value Ref Range   FIO2 0.60     O2 Content 10.0     Delivery systems TRACH COLLAR/TRACH TUBE     pH, Arterial 7.286 (*) 7.350 - 7.450   pCO2 arterial 76.4 (*) 35.0 - 45.0 mmHg   Comment: CRITICAL RESULT CALLED TO, READ BACK BY AND VERIFIED WITH:     JESSICA SCHLOEMER,RN AT  1101, BY W.VIA RRT,RCP ON 04/20/2014   pO2, Arterial 72.6 (*) 80.0 - 100.0 mmHg   Bicarbonate 35.2 (*) 20.0 - 24.0 mEq/L   TCO2 37.5  0 - 100 mmol/L   Acid-Base Excess 8.6 (*) 0.0 - 2.0 mmol/L   O2 Saturation 92.9     Patient temperature 98.6     Collection site LEFT RADIAL     Drawn by [id number]     Sample type ARTERIAL DRAW     Allens test (pass/fail) PASS  PASS  GLUCOSE, CAPILLARY     Status: Abnormal   Collection Time    04/20/14 11:21 AM      Result Value Ref Range   Glucose-Capillary 217 (*) 70 - 99 mg/dL   Comment 1 Documented in Chart        Component Value Date/Time   SDES TRACHEAL ASPIRATE 04/17/2014 1155   SPECREQUEST Normal 04/17/2014 1155   CULT  Value: ABUNDANT STENOTROPHOMONAS MALTOPHILIA Performed at [facility] 04/17/2014 1155   REPTSTATUS PENDING 04/17/2014 1155   Dg Chest Port 1 View  04/19/2014   CLINICAL DATA:  Central line placement  EXAM: PORTABLE CHEST - 1 VIEW  COMPARISON:  04/18/2014  FINDINGS: The study is limited by patient position. Cardiomegaly. NG tube in place. Right arm PICC line is unchanged in position. Left IJ central line with tip in SVC at the junction with the innominate vein. No pneumothorax. Again noted basilar atelectasis.  IMPRESSION: Cardiomegaly. NG tube in place. Right arm PICC line is unchanged in position. Left IJ central line with tip in SVC at the junction with the innominate vein. No pneumothorax. Again noted basilar atelectasis.   Electronically Signed   By: Lahoma Crocker M.D.   On: 04/19/2014 18:52   Recent Results (from the past 240 hour(s))  CULTURE, RESPIRATORY (NON-EXPECTORATED)     Status: None   Collection Time    04/16/14  6:06 PM      Result Value Ref Range Status   Specimen Description TRACHEAL ASPIRATE   Final   Special Requests NONE   Final   Gram Stain     Final   Value: FEW WBC PRESENT,BOTH PMN AND MONONUCLEAR     RARE SQUAMOUS EPITHELIAL CELLS PRESENT     RARE GRAM POSITIVE COCCI     IN PAIRS     Performed at  [facility]   Culture     Final   Value: ABUNDANT STENOTROPHOMONAS MALTOPHILIA     Performed at [facility]   Report Status PENDING   Incomplete  CULTURE, BLOOD (ROUTINE X 2)     Status: None   Collection Time    04/17/14  9:35 AM      Result Value Ref Range Status   Specimen Description BLOOD LEFT ANTECUBITAL   Final   Special Requests BOTTLES DRAWN AEROBIC ONLY [place]   Final  Culture  Setup Time     Final   Value: 04/17/2014 14:03     Performed at [facility]   Culture     Final   Value: STAPHYLOCOCCUS SPECIES (COAGULASE NEGATIVE)     Note: SUSCEPTIBILITIES PERFORMED ON PREVIOUS CULTURE WITHIN THE LAST 5 DAYS.     Note: Gram Stain Report Called to,Read Back By and Verified With: DARA ANDERSON ON 04/18/2014 AT 6:30P BY Dennard Nip     Performed at [facility]   Report Status 04/20/2014 FINAL   Final  CULTURE, BLOOD (ROUTINE X 2)     Status: None   Collection Time    04/17/14  9:40 AM      Result Value Ref Range Status   Specimen Description BLOOD LEFT HAND   Final   Special Requests BOTTLES DRAWN AEROBIC ONLY 2CC   Final   Culture  Setup Time     Final   Value: 04/17/2014 14:03     Performed at [facility]   Culture     Final   Value: STAPHYLOCOCCUS SPECIES (COAGULASE NEGATIVE)     Note: RIFAMPIN AND GENTAMICIN SHOULD NOT BE USED AS SINGLE DRUGS FOR TREATMENT OF STAPH INFECTIONS.     Note: Gram Stain Report Called to,Read Back By and Verified With: DARA ANDERSON ON 04/18/2014 AT 6:30P BY Dennard Nip     Performed at [facility]   Report Status 04/20/2014 FINAL   Final   Organism ID, Bacteria STAPHYLOCOCCUS SPECIES (COAGULASE NEGATIVE)   Final  CULTURE, RESPIRATORY (NON-EXPECTORATED)     Status: None   Collection Time    04/17/14 11:55 AM      Result Value Ref Range Status   Specimen Description TRACHEAL ASPIRATE   Final   Special Requests Normal   Final   Gram Stain     Final   Value: ABUNDANT WBC PRESENT, PREDOMINANTLY PMN     RARE  SQUAMOUS EPITHELIAL CELLS PRESENT     NO ORGANISMS SEEN     Performed at [facility]   Culture     Final   Value: ABUNDANT STENOTROPHOMONAS MALTOPHILIA     Performed at [facility]   Report Status PENDING   Incomplete   Organism ID, Bacteria STENOTROPHOMONAS MALTOPHILIA   Final      04/20/2014, 1:41 PM     LOS: 13 days     **Disclaimer: This note may have been dictated with voice recognition software. Similar sounding words can inadvertently be transcribed and this note may contain transcription errors which may not have been corrected upon publication of note.**

## 2014-04-20 NOTE — Progress Notes (Signed)
Dr Kieth Brightly notified of pt's increase work of breathing, with RR in the 18s  ST with HR 100-120s at times.  PRN meds given to address pain and possible anxiety.  Verbal ABG order placed per MD.  Pt currently resting with eyes closed - will continue to closely monitor.

## 2014-04-20 NOTE — Progress Notes (Addendum)
Clinical Social Work Department CLINICAL SOCIAL WORK PLACEMENT NOTE 04/20/2014  Patient:  Joanna Perez, Joanna Perez  Account Number:  [id number] Admit date:  04/07/2014  Clinical Social Worker:  Ky Barban, Latanya Presser  Date/time:  04/20/2014 04:11 PM  Clinical Social Work is seeking post-discharge placement for this patient at the following level of care:   [place]   (*CSW will update this form in Epic as items are completed)   N/A-informed husband of vent SNFs in phone conversation. Referrals made to [facility] and [facility] in [place].  Patient/family provided with [place] Department of Clinical Social Work's list of facilities offering this level of care within the geographic area requested by the patient (or if unable, by the patient's family).  04/20/2014  Patient/family informed of their freedom to choose among providers that offer the needed level of care, that participate in Medicare, Medicaid or managed care program needed by the patient, have an available bed and are willing to accept the patient.  N/A-not sent to this facility as they are unable to take vent pt  Patient/family informed of [facility]' ownership interest in [facility], as well as of the fact that they are under no obligation to receive care at this facility.  PASARR submitted to EDS on EXISTING PASARR number received from EDS on EXISTING  FL2 transmitted to all facilities in geographic area requested by pt/family on 04/20/14  FL2 transmitted to all facilities within larger geographic area on 04/20/14  Patient informed that his/her managed care company has contracts with or will negotiate with  certain facilities, including the following:     Patient/family informed of bed offers received:  05/17/14 Patient chooses bed at [facility] [place], [place] Physician recommends and patient chooses bed at    Patient to be transferred to [facility] on  05/18/14  Patient to be transferred to facility by [facility]  The following physician request were entered in Epic:   Additional Comments: MD states pt unlikely to wean off vent and will need vent SNF. CSW made referrals to vent SNF [facility] in [place], [facility] in [place], and [facility] in [place], [place]. Updated husband that pt has been added to the waiting list for [facility], and [facility] and [facility] are still reviewing clinicals. Will updated pt's husband as soon as facilities respond.  Addendum: CSW called pt's husband Jeneen Rinks on 05/17/14 and left voicemail message explaining discharge planned for 05/18/14 in the am. CSW informed pt of discharge as well. CSW called pt's husband at time of discharge (9am, 05/18/14) and informed him [facility] is here to take her to facility. No questions/concerns expressed. CSW signing off.  Ky Barban, MSW, [facility] Clinical Social Worker [phone]

## 2014-04-20 NOTE — Progress Notes (Signed)
CSW spoke on the phone with pt's husband and had conversation concerning vent SNF placement. Husband aware of limited options for placement. CSW to update husband once  facilities and [place] facility respond to bed request.   Ky Barban, MSW, [place] Social Worker [phone]

## 2014-04-20 NOTE — Progress Notes (Signed)
SLP Cancellation Note  Patient Details Name: Joanna Perez MRN: [id number] DOB: Aug 28, 1956   Cancelled treatment:        Pt. Required respiratory assist with vent (PRVS) per RT due to abdominal pain, increased lethargy compared to yesterday.  ST will follow along for PMSV when appropriate.   Orbie Pyo [place].Ed [employer] [phone]  04/20/2014

## 2014-04-20 NOTE — Progress Notes (Signed)
PULMONARY / CRITICAL CARE MEDICINE  Name: Joanna Perez MRN: [id number] DOB: 1956/05/09    ADMISSION DATE:  04/07/2014 CONSULTATION DATE:  04/09/2014  REFERRING MD :  Dr. Annette Stable PRIMARY SERVICE: PCCM  CHIEF COMPLAINT:  Paraplegia   BRIEF PATIENT DESCRIPTION: 58 yo with COPD, CHF, DM, CAD s/p MI, atrial fibrillation transferred from [facility] for BLE paraplegia, found to have T6 fracture with spinal stenosis and spinal cord compression on MRI. She was taken for decompressive surgery and remained on mechanical ventilation post surgery.   SIGNIFICANT EVENTS / STUDIES:  5/16  Admitted to [facility], hypotensive and with new paraplegia 5/17  Transfer to [facility] 5/17  MR Cervical/Thoracic/Lumbar Spine - worsened T6 compression fracture with paraspinal and epidural hematoma causing cord compression and edema, possible early cord infarct 5/18  Intubation for procedure 5/18  Thoracic laminectomy 5/18  Extubated 5/19  Somnolent / hypercarbic >>> reintubated 5/26  Tracheostomy 5/28  TTE >>> Ef 65%, vegetation is not reported  LINES / TUBES: ETT 5/18 >>> 5/18; 5/19 >>>5/26 Trach 5/26 (df) >>> R R AL 5/17 >>> ??? Foley 5/16 >>> R PICC 5/22 >>> 5/28  CULTURES: 5/16  Blood >>>  COAG NEG STAPH 5/16  Urine >>> neg 5/26  Sputum >>> STENOTROPHOMONAS MALTOPHILIA 5/26  Blood >>> COAG NEG STAPH 5/30  Blood >>>  ANTIBIOTICS:  Vancomycin 5/17 >>> Levaquin 5/17 >>> 5/19 Imipenem 5/28 >>>  INTERVAL HISTORY: In respiratory distress / lethargic >>> ABG >>> acidosis / hypercarbia >>> placed on vent  VITAL SIGNS: Temp:  [98 F (36.7 C)-99.7 F (37.6 C)] 98 F (36.7 C) (05/29 1552) Pulse Rate:  [84-110] 84 (05/29 1552) Resp:  [19-33] 22 (05/29 1552) BP: (94-131)/(46-64) 114/49 mmHg (05/29 1552) SpO2:  [92 %-98 %] 98 % (05/29 1552) FiO2 (%):  [40 %-60 %] 40 % (05/29 1552) Weight:  [101.8 kg (224 lb 6.9 oz)] 101.8 kg (224 lb 6.9 oz) (05/29 0434)  HEMODYNAMICS: CVP:  [12 mmHg-16 mmHg] 13  mmHg  VENTILATOR SETTINGS: Vent Mode:  [-] PRVC FiO2 (%):  [40 %-60 %] 40 % Set Rate:  [20 bmp] 20 bmp Vt Set:  [400 mL] 400 mL PEEP:  [5 cmH20] 5 cmH20 Plateau Pressure:  [23 cmH20-24 cmH20] 24 cmH20  INTAKE / OUTPUT: Intake/Output     05/28 0701 - 05/29 0700 05/29 0701 - 05/30 0700   I.V. (mL/kg) 20 (0.2)    NG/GT 730 1140   IV Piggyback  100   Total Intake(mL/kg) 750 (7.4) 1240 (12.2)   Urine (mL/kg/hr) 625 (0.3) 200 (0.2)   Total Output 625 200   Net +125 +1040        Urine Occurrence  1 x   Stool Occurrence  2 x     PHYSICAL EXAMINATION: General:  Work of breathing somewhat increased Neuro:  Sleepy but arouses to voice HEENT:  Tracheostomy site intact Cardiovascular:  Regular, no murmurs Lungs:  Bilateral air entry / rhonchi and few expiratory wheezes Abdomen:  Soft, bowel sounds diminished, mild distention, reducible ventral hernia Musculoskeletal:  No edema Skin:  No rash  LABS:  CBC  Recent Labs Lab 04/18/14 0500 04/19/14 0400 04/20/14 0500  WBC 8.8 10.2 14.7*  HGB 8.8* 8.6* 8.9*  HCT 29.8* 28.6* 30.5*  PLT PLATELET CLUMPS NOTED ON SMEAR, COUNT APPEARS ADEQUATE 467* 504*   Coag's  Recent Labs Lab 04/17/14 0355  INR 1.11   BMET  Recent Labs Lab 04/18/14 0500 04/19/14 0400 04/20/14 0500  NA 142 143  143  K 4.0 4.2 4.2  CL 96 98 99  CO2 37* 38* 36*  BUN 26* 37* 59*  CREATININE 0.43* 0.59 0.76  GLUCOSE 218* 119* 143*   Electrolytes  Recent Labs Lab 04/18/14 0500 04/19/14 0400 04/20/14 0500  CALCIUM 9.1 8.9 8.8   Sepsis Markers No results found for this basename: LATICACIDVEN, PROCALCITON, O2SATVEN,  in the last 168 hours ABG  Recent Labs Lab 04/16/14 1600 04/20/14 0945  PHART 7.322* 7.286*  PCO2ART 74.0* 76.4*  PO2ART 78.0* 72.6*   Liver Enzymes  Recent Labs Lab 04/18/14 0500  AST 169*  ALT 126*  ALKPHOS 64  BILITOT 0.3  ALBUMIN 1.6*   Cardiac Enzymes No results found for this basename: TROPONINI, PROBNP,  in  the last 168 hours Glucose  Recent Labs Lab 04/19/14 2037 04/19/14 2341 04/20/14 0442 04/20/14 0757 04/20/14 1121 04/20/14 1555  GLUCAP 202* 172* 127* 159* 217* 176*   IMAGING: Dg Chest Port 1 View  04/19/2014   CLINICAL DATA:  Central line placement  EXAM: PORTABLE CHEST - 1 VIEW  COMPARISON:  04/18/2014  FINDINGS: The study is limited by patient position. Cardiomegaly. NG tube in place. Right arm PICC line is unchanged in position. Left IJ central line with tip in SVC at the junction with the innominate vein. No pneumothorax. Again noted basilar atelectasis.  IMPRESSION: Cardiomegaly. NG tube in place. Right arm PICC line is unchanged in position. Left IJ central line with tip in SVC at the junction with the innominate vein. No pneumothorax. Again noted basilar atelectasis.   Electronically Signed   By: Lahoma Crocker M.D.   On: 04/19/2014 18:52   ASSESSMENT / PLAN:  PULMONARY A: Acute on chronic respiratory failure Respiratory muscle weakness secondary to cord compression / new T6 paraplegia  COPD with possible exacerbation Suspect OSA / OHS Tracheostomy status P:    Goal SpO2>92 Mechanical vent support Trach collar as tolerated, but suspect will require some degree of mechanical support daily DuoNeb / Albuterol / Pulmicort Prednisone to complete 14 days SLP  CARDIOVASCULAR A:  Chronic diastolic heart failure Dyslipidemia P:  Statin held secondary to elevated transaminases  RENAL A:   Stay balance positive ~ 4.5L P:   Trend BMP Lasix increase 40 mg IV q6h  GASTROINTESTINAL A:   Nutrition GERD P:   Protonix as preadmission TF SLP eval  HEMATOLOGIC A:   Anemia VTE Px P:  Trend CBC SCD  INFECTIOUS A:   Bacteremia with COAG NEG STAPH ( in spite of Vancomycin ) Endocarditis? 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 Allergy PCN / cephalosporins P:   ID consultation appreciated Meropenem / Vancomycin Blood culture on 5/20 TEE? Neck  MRI?  ENDOCRINE  A:   Hyperglycemia Hypothyroidism P:   SSI Lantus 15 bid Synthroid  NEUROLOGIC A:   T6 cord compression / paraplegia, s/p spinal cord decompression, still no LE movement Delirium P:   Fentanyl PRN Versed PRN Celexa, Xanax as preadmission  I have personally obtained history, examined patient, evaluated and interpreted laboratory and imaging results, reviewed medical records, formulated assessment / plan and placed orders.  Doree Fudge, MD Pulmonary and [place] Pager: [phone]  04/20/2014, 4:14 PM

## 2014-04-21 DIAGNOSIS — D72829 Elevated white blood cell count, unspecified: Secondary | ICD-10-CM

## 2014-04-21 DIAGNOSIS — Z228 Carrier of other infectious diseases: Secondary | ICD-10-CM

## 2014-04-21 DIAGNOSIS — I509 Heart failure, unspecified: Secondary | ICD-10-CM

## 2014-04-21 LAB — GLUCOSE, CAPILLARY
GLUCOSE-CAPILLARY: 111 mg/dL — AB (ref 70–99)
GLUCOSE-CAPILLARY: 193 mg/dL — AB (ref 70–99)
GLUCOSE-CAPILLARY: 162 mg/dL — AB (ref 70–99)
Glucose-Capillary: 138 mg/dL — ABNORMAL HIGH (ref 70–99)
Glucose-Capillary: 160 mg/dL — ABNORMAL HIGH (ref 70–99)
Glucose-Capillary: 239 mg/dL — ABNORMAL HIGH (ref 70–99)

## 2014-04-21 LAB — CULTURE, RESPIRATORY W GRAM STAIN

## 2014-04-21 LAB — CBC
HCT: 28.4 % — ABNORMAL LOW (ref 36.0–46.0)
HEMOGLOBIN: 8.7 g/dL — AB (ref 12.0–15.0)
MCH: 26.4 pg (ref 26.0–34.0)
MCHC: 30.6 g/dL (ref 30.0–36.0)
MCV: 86.3 fL (ref 78.0–100.0)
Platelets: 499 10*3/uL — ABNORMAL HIGH (ref 150–400)
RBC: 3.29 MIL/uL — AB (ref 3.87–5.11)
RDW: 20.2 % — ABNORMAL HIGH (ref 11.5–15.5)
WBC: 16.8 10*3/uL — AB (ref 4.0–10.5)

## 2014-04-21 LAB — BASIC METABOLIC PANEL
BUN: 71 mg/dL — ABNORMAL HIGH (ref 6–23)
CALCIUM: 9.1 mg/dL (ref 8.4–10.5)
CHLORIDE: 100 meq/L (ref 96–112)
CO2: 35 mEq/L — ABNORMAL HIGH (ref 19–32)
CREATININE: 0.8 mg/dL (ref 0.50–1.10)
GFR calc Af Amer: >90 mL/min (ref >90)
GFR calc non Af Amer: 80 mL/min — ABNORMAL LOW (ref >90)
GLUCOSE: 180 mg/dL — AB (ref 70–99)
Potassium: 4.2 mEq/L (ref 3.7–5.3)
Sodium: 143 mEq/L (ref 137–147)

## 2014-04-21 LAB — HIV ANTIBODY (ROUTINE TESTING W REFLEX): HIV: NONREACTIVE

## 2014-04-21 LAB — CULTURE, RESPIRATORY: Special Requests: NORMAL

## 2014-04-21 NOTE — Progress Notes (Signed)
[facility] for Infectious Disease    Date of Admission:  04/07/2014   Total days of antibiotics 14        Day 14 vanco        Day 3 imi           ID: Joanna Perez is a 58 y.o. 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 ALPRAZolam  1 mg Oral TID  . antiseptic oral rinse  15 mL Mouth Rinse QID  . budesonide (PULMICORT) nebulizer solution  0.5 mg Nebulization BID  . chlorhexidine  15 mL Mouth Rinse BID  . cholecalciferol  400 Units Oral Daily  . citalopram  20 mg Oral Daily  . feeding supplement (VITAL HIGH PROTEIN)  1,000 mL Per Tube Q24H  . furosemide  40 mg Intravenous Q6H  . guaiFENesin  600 mg Oral Daily  . imipenem-cilastatin  500 mg Intravenous 4 times per day  . insulin aspart  0-15 Units Subcutaneous 6 times per day  . insulin glargine  15 Units Subcutaneous BID  . ipratropium-albuterol  3 mL Nebulization BID  . levothyroxine  112 mcg Oral QAC breakfast  . pantoprazole sodium  40 mg Per Tube Daily  . predniSONE  10 mg Oral Q breakfast  . senna  1 tablet Oral BID  . sodium chloride  10-40 mL Intracatheter Q12H  . vancomycin  1,000 mg Intravenous Q12H  . vecuronium  10 mg Intravenous Once    Objective: Vital signs in last 24 hours: Temp:  [97.8 F (36.6 C)-99 F (37.2 C)] 97.9 F (36.6 C) (05/30 0800) Pulse Rate:  [78-99] 97 (05/30 0859) Resp:  [20-29] 23 (05/30 0859) BP: (94-131)/(45-68) 111/45 mmHg  (05/30 0859) SpO2:  [93 %-99 %] 94 % (05/30 0859) FiO2 (%):  [40 %] 40 % (05/30 0859) Weight:  [217 lb 9.5 oz (98.7 kg)-224 lb 10.4 oz (101.9 kg)] 217 lb 9.5 oz (98.7 kg) (05/30 0541) Physical Exam  Constitutional:  Opens eyes to name sleepy. appears well-developed and well-nourished. No distress.  HENT: trach in place. dophoff in place Mouth/Throat: Oropharynx is clear and moist. No oropharyngeal exudate. Dry oral pharynx. No thrush Cardiovascular: Normal rate, regular rhythm and normal heart sounds. Exam reveals no gallop and no friction rub.  No murmur heard.  Pulmonary/Chest: Effort normal and breath sounds normal. No respiratory distress.  Mild expiratory rhonchi Abdominal: Soft. Bowel sounds are normal.  exhibits no distension. There is no tenderness.  Lymphadenopathy: no cervical adenopathy.  Skin: Skin is warm and dry. No rash noted.scattered echymosis  Lab Results  Recent Labs  04/20/14 0500 04/21/14 0500  WBC 14.7* 16.8*  HGB 8.9* 8.7*  HCT 30.5* 28.4*  NA 143 143  K 4.2 4.2  CL 99 100  CO2 36* 35*  BUN 59* 71*  CREATININE 0.76 0.80   Liver Panel No results found for this basename: PROT, ALBUMIN, AST, ALT, ALKPHOS, BILITOT, BILIDIR,  IBILI,  in the last 72 hours Sedimentation Rate No results found for this basename: ESRSEDRATE,  in the last 72 hours C-Reactive Protein No results found for this basename: CRP,  in the last 72 hours  Microbiology: 5/30 blood cx x 2 PENDING 5/26 resp cx steno R tmp/smx R FQ 5/26 blood cx x 2 MSSE 5/16 blood cx x 2 MSSE Studies/Results: Dg Chest Port 1 View  04/19/2014   CLINICAL DATA:  Central line placement  EXAM: PORTABLE CHEST - 1 VIEW  COMPARISON:  04/18/2014  FINDINGS: The study is limited by patient position. Cardiomegaly. NG tube in place. Right arm PICC line is unchanged in position. Left IJ central line with tip in SVC at the junction with the innominate vein. No pneumothorax. Again noted basilar atelectasis.  IMPRESSION:  Cardiomegaly. NG tube in place. Right arm PICC line is unchanged in position. Left IJ central line with tip in SVC at the junction with the innominate vein. No pneumothorax. Again noted basilar atelectasis.   Electronically Signed   By: Lahoma Crocker M.D.   On: 04/19/2014 18:52     Assessment/Plan: 1) MSSE bacteremia = continue on vanco, meropenem for now, if repeat blood cx from 5/30 remain NGTD then will narrow her antibiotic spectrum  2) leukocytosis = continues to trend, remains on low dose pred of 10mg  daily. Would have low threshold to test for cdifficile due to prolonged exposure to antibiotics  3) stenotroph respiratory colonization = for now will continue to monitor cxr and clinical picture for hcap.   [facility] for Infectious Diseases Cell: [phone] Pager: [phone]  04/21/2014, 9:09 AM

## 2014-04-21 NOTE — Progress Notes (Signed)
Noted patient's catheter with little output and patient complaining of recurrent abdominal pain. Bladder scan read 95ml.  650 ml out with extensive manipulation of catheter.  Notified MD and replaced with 16 French foley, 2350 ml of clear yellow urine returned post placement, patient tolerated well.

## 2014-04-21 NOTE — Progress Notes (Signed)
PULMONARY / CRITICAL CARE MEDICINE  Name: Joanna Perez MRN: [id number] DOB: 1956/08/11    ADMISSION DATE:  04/07/2014 CONSULTATION DATE:  04/09/2014  REFERRING MD :  Dr. Annette Stable PRIMARY SERVICE: PCCM  CHIEF COMPLAINT:  Paraplegia   BRIEF PATIENT DESCRIPTION: 58 yo with COPD, CHF, DM, CAD s/p MI, atrial fibrillation transferred from [facility] for BLE paraplegia, found to have T6 fracture with spinal stenosis and spinal cord compression on MRI. She was taken for decompressive surgery and remained on mechanical ventilation post surgery.   SIGNIFICANT EVENTS / STUDIES:  5/16  Admitted to [facility], hypotensive and with new paraplegia 5/17  Transfer to [facility] 5/17  MR Cervical/Thoracic/Lumbar Spine - worsened T6 compression fracture with paraspinal and epidural hematoma causing cord compression and edema, possible early cord infarct 5/18  Intubation for procedure 5/18  Thoracic laminectomy 5/18  Extubated 5/19  Somnolent / hypercarbic >>> reintubated 5/26  Tracheostomy 5/28  TTE >>> Ef 65%, vegetation is not reported  LINES / TUBES: ETT 5/18 >>> 5/18; 5/19 >>>5/26 Trach 5/26 (df) >>> R R AL 5/17 >>> ??? Foley 5/16 >>> R PICC 5/22 >>> 5/28  CULTURES: 5/16  Blood >>>  COAG NEG STAPH 5/16  Urine >>> neg 5/26  Sputum >>> STENOTROPHOMONAS MALTOPHILIA 5/26  Blood >>> COAG NEG STAPH 5/30  Blood >>>  ANTIBIOTICS per ID Vancomycin 5/17 >>> Levaquin 5/17 >>> 5/19 Imipenem 5/28 >>>  INTERVAL HISTORY:   Comfortable on vent  VITAL SIGNS: Temp:  [97.8 F (36.6 C)-99 F (37.2 C)] 97.9 F (36.6 C) (05/30 0800) Pulse Rate:  [78-98] 98 (05/30 0900) Resp:  [20-24] 24 (05/30 0900) BP: (101-124)/(45-68) 111/45 mmHg (05/30 0859) SpO2:  [93 %-99 %] 93 % (05/30 0900) FiO2 (%):  [40 %] 40 % (05/30 1022) Weight:  [217 lb 9.5 oz (98.7 kg)-224 lb 10.4 oz (101.9 kg)] 217 lb 9.5 oz (98.7 kg) (05/30 0541)  HEMODYNAMICS: CVP:  [6 mmHg-15 mmHg] 10 mmHg  VENTILATOR SETTINGS: Vent Mode:   [-] PRVC FiO2 (%):  [40 %] 40 % Set Rate:  [20 bmp] 20 bmp Vt Set:  [400 mL] 400 mL PEEP:  [5 cmH20] 5 cmH20 Plateau Pressure:  [21 cmH20-26 cmH20] 26 cmH20  INTAKE / OUTPUT: Intake/Output     05/29 0701 - 05/30 0700 05/30 0701 - 05/31 0700   I.V. (mL/kg)     NG/GT 1890    IV Piggyback 900    Total Intake(mL/kg) 2790 (28.3)    Urine (mL/kg/hr) 3200 (1.4) 1975 (4.3)   Total Output 3200 1975   Net -410 -1975        Urine Occurrence 2 x    Stool Occurrence 3 x      PHYSICAL EXAMINATION: General:  nad on vent Neuro:  Sleepy but arouses to voice HEENT:  Tracheostomy site intact Cardiovascular:  Regular, no murmurs Lungs:  Bilateral air entry / rhonchi and few expiratory wheezes Abdomen:  Soft, bowel sounds diminished, mild distention, reducible ventral hernia Musculoskeletal:  No edema Skin:  No rash  LABS:  CBC  Recent Labs Lab 04/19/14 0400 04/20/14 0500 04/21/14 0500  WBC 10.2 14.7* 16.8*  HGB 8.6* 8.9* 8.7*  HCT 28.6* 30.5* 28.4*  PLT 467* 504* 499*   Coag's  Recent Labs Lab 04/17/14 0355  INR 1.11   BMET  Recent Labs Lab 04/19/14 0400 04/20/14 0500 04/21/14 0500  NA 143 143 143  K 4.2 4.2 4.2  CL 98 99 100  CO2 38* 36* 35*  BUN 37* 59* 71*  CREATININE 0.59 0.76 0.80  GLUCOSE 119* 143* 180*   Electrolytes  Recent Labs Lab 04/19/14 0400 04/20/14 0500 04/21/14 0500  CALCIUM 8.9 8.8 9.1   Sepsis Markers No results found for this basename: LATICACIDVEN, PROCALCITON, O2SATVEN,  in the last 168 hours ABG  Recent Labs Lab 04/16/14 1600 04/20/14 0945  PHART 7.322* 7.286*  PCO2ART 74.0* 76.4*  PO2ART 78.0* 72.6*   Liver Enzymes  Recent Labs Lab 04/18/14 0500  AST 169*  ALT 126*  ALKPHOS 64  BILITOT 0.3  ALBUMIN 1.6*   Cardiac Enzymes No results found for this basename: TROPONINI, PROBNP,  in the last 168 hours Glucose  Recent Labs Lab 04/20/14 1121 04/20/14 1555 04/20/14 2015 04/21/14 0046 04/21/14 0434 04/21/14 0916   GLUCAP 217* 176* 155* 138* 111* 193*   IMAGING: Dg Chest Port 1 View  04/19/2014   CLINICAL DATA:  Central line placement  EXAM: PORTABLE CHEST - 1 VIEW  COMPARISON:  04/18/2014  FINDINGS: The study is limited by patient position. Cardiomegaly. NG tube in place. Right arm PICC line is unchanged in position. Left IJ central line with tip in SVC at the junction with the innominate vein. No pneumothorax. Again noted basilar atelectasis.  IMPRESSION: Cardiomegaly. NG tube in place. Right arm PICC line is unchanged in position. Left IJ central line with tip in SVC at the junction with the innominate vein. No pneumothorax. Again noted basilar atelectasis.   Electronically Signed   By: Lahoma Crocker M.D.   On: 04/19/2014 18:52   ASSESSMENT / PLAN:  PULMONARY A: Acute on chronic respiratory failure Respiratory muscle weakness secondary to cord compression / new T6 paraplegia  COPD with possible exacerbation Suspect OSA / OHS Tracheostomy status P:    Goal SpO2>92 Mechanical vent support Trach collar as tolerated, but suspect will require some degree of mechanical support daily DuoNeb / Albuterol / Pulmicort SLP    CARDIOVASCULAR A:  Chronic diastolic heart failure Dyslipidemia P:  Statin held secondary to elevated transaminases  RENAL A:   Stay balance positive   P:   Trend BMP Hold further lasix 5/30 with cvp 6 and bun up  GASTROINTESTINAL A:   Nutrition GERD P:   Protonix as preadmission TF SLP eval  HEMATOLOGIC A:   Anemia VTE Px P:  Trend CBC SCD  INFECTIOUS A:   Bacteremia with COAG NEG STAPH ( in spite of Vancomycin ) Endocarditis? 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 Allergy PCN / cephalosporins P:   ID consultation appreciated Meropenem / Vancomycin      ENDOCRINE  A:   Hyperglycemia Hypothyroidism P:   SSI Lantus 15 bid Synthroid  NEUROLOGIC A:   T6 cord compression / paraplegia, s/p spinal cord decompression, still no LE  movement Delirium P:   Fentanyl PRN Versed PRN Celexa, Xanax as preadmission    Christinia Gully, MD Pulmonary and [place] [phone] After 5:30 PM or weekends, call [phone]

## 2014-04-22 DIAGNOSIS — E039 Hypothyroidism, unspecified: Secondary | ICD-10-CM

## 2014-04-22 LAB — GLUCOSE, CAPILLARY
GLUCOSE-CAPILLARY: 133 mg/dL — AB (ref 70–99)
GLUCOSE-CAPILLARY: 133 mg/dL — AB (ref 70–99)
GLUCOSE-CAPILLARY: 137 mg/dL — AB (ref 70–99)
Glucose-Capillary: 126 mg/dL — ABNORMAL HIGH (ref 70–99)
Glucose-Capillary: 144 mg/dL — ABNORMAL HIGH (ref 70–99)
Glucose-Capillary: 137 mg/dL — ABNORMAL HIGH (ref 70–99)

## 2014-04-22 LAB — VANCOMYCIN, TROUGH: Vancomycin Tr: 33.8 ug/mL (ref 10.0–20.0)

## 2014-04-22 MED ORDER — VANCOMYCIN HCL IN DEXTROSE 1-5 GM/200ML-% IV SOLN
1000.0000 mg | INTRAVENOUS | Status: DC
  Administered 2014-04-23 – 2014-04-25 (×3): 1000 mg via INTRAVENOUS
  Filled 2014-04-22 (×4): qty 200

## 2014-04-22 NOTE — Progress Notes (Signed)
ANTIBIOTIC CONSULT NOTE - FOLLOW UP  Pharmacy Consult for vancomycin Indication: CNS bacteremia with PCN allergy  Allergies  Allergen Reactions  . Amoxicillin Hives  . Bactrim [Sulfamethoxazole-Tmp Ds] Hives  . Erythromycin Hives  . Keflex [Cephalexin] Hives  . Penicillins Itching and Swelling    Sweating    Patient Measurements: Height: 5\' 2"  (157.5 cm) Weight: 210 lb 5.1 oz (95.4 kg) IBW/kg (Calculated) : 50.1 Vital Signs: Temp: 98.3 F (36.8 C) (05/31 1548) Temp src: Oral (05/31 0740) BP: 148/69 mmHg (05/31 1548) Pulse Rate: 80 (05/31 1548) Intake/Output from previous day: 05/30 0701 - 05/31 0700 In: 1360 [NG/GT:960; IV Piggyback:400] Out: 7175 [Urine:7175] Intake/Output from this shift: Total I/O In: 250 [NG/GT:250] Out: -   Labs:  Recent Labs  04/20/14 0500 04/21/14 0500  WBC 14.7* 16.8*  HGB 8.9* 8.7*  PLT 504* 499*  CREATININE 0.76 0.80   Estimated Creatinine Clearance: 83.5 ml/min (by C-G formula based on Cr of 0.8).  Recent Labs  04/22/14 1605  VANCOTROUGH 33.8*     Microbiology: Recent Results (from the past 720 hour(s))  URINE CULTURE     Status: None   Collection Time    04/07/14 11:50 AM      Result Value Ref Range Status   Specimen Description URINE, CATHETERIZED   Final   Special Requests NONE   Final   Culture  Setup Time     Final   Value: 04/08/2014 01:35     Performed at [facility]     Final   Value: NO GROWTH     Performed at [facility]   Culture     Final   Value: NO GROWTH     Performed at [facility]   Report Status 04/09/2014 FINAL   Final  CULTURE, BLOOD (ROUTINE X 2)     Status: None   Collection Time    04/07/14 12:02 PM      Result Value Ref Range Status   Specimen Description BLOOD RIGHT ANTECUBITAL   Final   Special Requests BOTTLES DRAWN AEROBIC ONLY [facility]   Final   Culture  Setup Time     Final   Value: 04/08/2014 21:13     Performed at [facility]    Culture     Final   Value: STAPHYLOCOCCUS SPECIES (COAGULASE NEGATIVE)     Note: RIFAMPIN AND GENTAMICIN SHOULD NOT BE USED AS SINGLE DRUGS FOR TREATMENT OF STAPH INFECTIONS.     Note: Performed at [facility]     Performed at [facility]   Report Status 04/10/2014 FINAL   Final   Organism ID, Bacteria STAPHYLOCOCCUS SPECIES (COAGULASE NEGATIVE)   Final  CULTURE, BLOOD (ROUTINE X 2)     Status: None   Collection Time    04/07/14 12:02 PM      Result Value Ref Range Status   Specimen Description BLOOD RIGHT HAND   Final   Special Requests BOTTLES DRAWN AEROBIC AND ANAEROBIC 8CC   Final   Culture  Setup Time     Final   Value: 04/08/2014 21:16     Performed at [facility]   Culture     Final   Value: STAPHYLOCOCCUS SPECIES (COAGULASE NEGATIVE)     Note: SUSCEPTIBILITIES PERFORMED ON PREVIOUS CULTURE WITHIN THE LAST 5 DAYS.     Note: Performed at [facility] Gram Stain Report Called to,Read Back By and Verified With: STONE A 04/08/14  0811 BY THOMPSON S     Performed at [facility]   Report Status 04/10/2014 FINAL   Final  SURGICAL PCR SCREEN     Status: None   Collection Time    04/08/14  7:54 PM      Result Value Ref Range Status   MRSA, PCR NEGATIVE  NEGATIVE Final   Staphylococcus aureus NEGATIVE  NEGATIVE Final   Comment:            The Xpert SA Assay (FDA     approved for NASAL specimens     in patients over 34 years of age),     is one component of     a comprehensive surveillance     program.  Test performance has     been validated by [employer] for patients greater     than or equal to 51 year old.     It is not intended     to diagnose infection nor to     guide or monitor treatment.  CULTURE, RESPIRATORY (NON-EXPECTORATED)     Status: None   Collection Time    04/16/14  6:06 PM      Result Value Ref Range Status   Specimen Description TRACHEAL ASPIRATE   Final   Special Requests NONE   Final   Gram Stain     Final    Value: FEW WBC PRESENT,BOTH PMN AND MONONUCLEAR     RARE SQUAMOUS EPITHELIAL CELLS PRESENT     RARE GRAM POSITIVE COCCI     IN PAIRS     Performed at [facility]   Culture     Final   Value: ABUNDANT STENOTROPHOMONAS MALTOPHILIA     Note: SUSCEPTIBILITIES PERFORMED ON PREVIOUS CULTURE WITHIN THE LAST 5 DAYS.     Performed at [facility]   Report Status 04/21/2014 FINAL   Final  CULTURE, BLOOD (ROUTINE X 2)     Status: None   Collection Time    04/17/14  9:35 AM      Result Value Ref Range Status   Specimen Description BLOOD LEFT ANTECUBITAL   Final   Special Requests BOTTLES DRAWN AEROBIC ONLY 1CC   Final   Culture  Setup Time     Final   Value: 04/17/2014 14:03     Performed at [facility]   Culture     Final   Value: STAPHYLOCOCCUS SPECIES (COAGULASE NEGATIVE)     Note: SUSCEPTIBILITIES PERFORMED ON PREVIOUS CULTURE WITHIN THE LAST 5 DAYS.     Note: Gram Stain Report Called to,Read Back By and Verified With: DARA ANDERSON ON 04/18/2014 AT 6:30P BY Dennard Nip     Performed at [facility]   Report Status 04/20/2014 FINAL   Final  CULTURE, BLOOD (ROUTINE X 2)     Status: None   Collection Time    04/17/14  9:40 AM      Result Value Ref Range Status   Specimen Description BLOOD LEFT HAND   Final   Special Requests BOTTLES DRAWN AEROBIC ONLY 2CC   Final   Culture  Setup Time     Final   Value: 04/17/2014 14:03     Performed at [facility]   Culture     Final   Value: STAPHYLOCOCCUS SPECIES (COAGULASE NEGATIVE)     Note: RIFAMPIN AND GENTAMICIN SHOULD NOT BE USED AS SINGLE DRUGS FOR TREATMENT OF STAPH INFECTIONS.     Note:  Gram Stain Report Called to,Read Back By and Verified With: DARA ANDERSON ON 04/18/2014 AT 6:30P BY Dennard Nip     Performed at [facility]   Report Status 04/20/2014 FINAL   Final   Organism ID, Bacteria STAPHYLOCOCCUS SPECIES (COAGULASE NEGATIVE)   Final  CULTURE, RESPIRATORY (NON-EXPECTORATED)     Status: None    Collection Time    04/17/14 11:55 AM      Result Value Ref Range Status   Specimen Description TRACHEAL ASPIRATE   Final   Special Requests Normal   Final   Gram Stain     Final   Value: ABUNDANT WBC PRESENT, PREDOMINANTLY PMN     RARE SQUAMOUS EPITHELIAL CELLS PRESENT     NO ORGANISMS SEEN     Performed at [facility]   Culture     Final   Value: ABUNDANT STENOTROPHOMONAS MALTOPHILIA     Performed at [facility]   Report Status 04/21/2014 FINAL   Final   Organism ID, Bacteria STENOTROPHOMONAS MALTOPHILIA   Final  CULTURE, BLOOD (ROUTINE X 2)     Status: None   Collection Time    04/21/14  3:20 AM      Result Value Ref Range Status   Specimen Description BLOOD LEFT ARM   Final   Special Requests BOTTLES DRAWN AEROBIC ONLY 10CC   Final   Culture  Setup Time     Final   Value: 04/21/2014 11:23     Performed at [facility]   Culture     Final   Value:        BLOOD CULTURE RECEIVED NO GROWTH TO DATE CULTURE WILL BE HELD FOR 5 DAYS BEFORE ISSUING A FINAL NEGATIVE REPORT     Performed at [facility]   Report Status PENDING   Incomplete  CULTURE, BLOOD (ROUTINE X 2)     Status: None   Collection Time    04/21/14  3:25 AM      Result Value Ref Range Status   Specimen Description BLOOD LEFT ARM   Final   Special Requests BOTTLES DRAWN AEROBIC ONLY 10CC   Final   Culture  Setup Time     Final   Value: 04/21/2014 11:23     Performed at [facility]   Culture     Final   Value:        BLOOD CULTURE RECEIVED NO GROWTH TO DATE CULTURE WILL BE HELD FOR 5 DAYS BEFORE ISSUING A FINAL NEGATIVE REPORT     Performed at [facility]   Report Status PENDING   Incomplete    Anti-infectives   Start     Dose/Rate Route Frequency Ordered Stop   04/19/14 1315  imipenem-cilastatin (PRIMAXIN) 500 mg in sodium chloride 0.9 % 100 mL IVPB     500 mg 200 mL/hr over 30 Minutes Intravenous 4 times per day 04/19/14 1311     04/13/14 1730  vancomycin  (VANCOCIN) IVPB 1000 mg/200 mL premix     1,000 mg 200 mL/hr over 60 Minutes Intravenous Every 12 hours 04/13/14 0847     04/10/14 1730  vancomycin (VANCOCIN) IVPB 750 mg/150 ml premix  Status:  Discontinued     750 mg 150 mL/hr over 60 Minutes Intravenous Every 12 hours 04/10/14 1112 04/10/14 1116   04/10/14 1730  vancomycin (VANCOCIN) IVPB 750 mg/150 ml premix  Status:  Discontinued     750 mg 150 mL/hr over 60 Minutes Intravenous Every 12  hours 04/10/14 1136 04/13/14 0847   04/08/14 2317  bacitracin 50,000 Units in sodium chloride irrigation 0.9 % 500 mL irrigation  Status:  Discontinued       As needed 04/08/14 2317 04/09/14 0049   04/08/14 1630  vancomycin (VANCOCIN) IVPB 750 mg/150 ml premix  Status:  Discontinued     750 mg 150 mL/hr over 60 Minutes Intravenous Every 12 hours 04/08/14 1538 04/10/14 1111   04/08/14 1000  levofloxacin (LEVAQUIN) tablet 500 mg  Status:  Discontinued     500 mg Oral Daily 04/07/14 1636 04/10/14 1116   04/07/14 1615  levofloxacin (LEVAQUIN) tablet 500 mg  Status:  Discontinued     500 mg Oral Daily 04/07/14 1611 04/07/14 1635      Assessment: 82 YOF on vancomycin day # 15 for coagulase negative staph bacteremia (PCN allergic) in both cultures 5/16 and 5/26. Third set of cultures from 5/30 - ngtd. WBC trending up to 16.8. Afeb. Pt with acute paraplegia so SCr not reflective of renal function but SCr has increased from 0.46 at baseline to 0.8 so likely renal function worsening. UOP remains good at 2.9 ml/kg/hr.   5/22 Vanc trough 11.1 -> increase to 1 G Q 12 5/24 Vanc trough 15.1 >> continue 1gm IV q12h  5/31 Vanc trough 33.8 mcg/ml (supratherapeutic) on 1gm IV q12h  Goal of Therapy:  Vancomycin trough level 15-20 mcg/ml  Plan:  Change vancomycin to 1g IV q24h. Next dose tomorrow at 1600. Follow-up trough at new Css.   Sherlon Handing, PharmD, BCPS Clinical pharmacist, pager [phone] 04/22/2014,5:50 PM

## 2014-04-22 NOTE — Progress Notes (Addendum)
PULMONARY / CRITICAL CARE MEDICINE  Name: DEBRIA BROECKER MRN: [id number] DOB: 10/15/1956    ADMISSION DATE:  04/07/2014 CONSULTATION DATE:  04/09/2014  REFERRING MD :  Dr. Annette Stable PRIMARY SERVICE: PCCM  CHIEF COMPLAINT:  Paraplegia   BRIEF PATIENT DESCRIPTION: 58 yo with COPD, CHF, DM, CAD s/p MI, atrial fibrillation transferred from [facility] for BLE paraplegia, found to have T6 fracture with spinal stenosis and spinal cord compression on MRI. She was taken for decompressive surgery and remained on mechanical ventilation post surgery.   SIGNIFICANT EVENTS / STUDIES:  5/16  Admitted to [facility], hypotensive and with new paraplegia 5/17  Transfer to [facility] 5/17  MR Cervical/Thoracic/Lumbar Spine - worsened T6 compression fracture with paraspinal and epidural hematoma causing cord compression and edema, possible early cord infarct 5/18  Intubation for procedure 5/18  Thoracic laminectomy 5/18  Extubated 5/19  Somnolent / hypercarbic >>> reintubated 5/26  Tracheostomy 5/28  TTE >>> Ef 65%, vegetation is not reported  LINES / TUBES: ETT 5/18 >>> 5/18; 5/19 >>>5/26 R R AL 5/17 >>> out Foley 5/16 >>> R PICC 5/22 >>> 5/28 Trach 5/26 (df) >>>   CULTURES: 5/16  Blood >>>  COAG NEG STAPH 5/16  Urine >>> neg 5/25 Sputum >  S maltophilia multiresistant 5/26  Sputum >>> STENOTROPHOMONAS MALTOPHILIA 5/26  Blood >>> COAG NEG STAPH 5/30  Blood x3  >>>   ANTIBIOTICS per ID Vancomycin 5/17 >>> Levaquin 5/17 >>> 5/19 Imipenem 5/28 >>>  INTERVAL HISTORY:   Comfortable on vent/ tolerating some Pressure support weaning   VITAL SIGNS: Temp:  [97.9 F (36.6 C)-98.8 F (37.1 C)] 97.9 F (36.6 C) (05/31 0740) Pulse Rate:  [75-96] 85 (05/31 0433) Resp:  [19-24] 20 (05/31 0433) BP: (96-122)/(40-63) 122/56 mmHg (05/31 0433) SpO2:  [94 %-100 %] 97 % (05/31 0740) FiO2 (%):  [40 %] 40 % (05/31 0740) Weight:  [210 lb 5.1 oz (95.4 kg)] 210 lb 5.1 oz (95.4 kg) (05/31  0433)  HEMODYNAMICS: CVP:  [7 mmHg-14 mmHg] 14 mmHg  VENTILATOR SETTINGS: Vent Mode:  [-] PSV;CPAP FiO2 (%):  [40 %] 40 % Set Rate:  [20 bmp] 20 bmp Vt Set:  [400 mL] 400 mL PEEP:  [5 cmH20] 5 cmH20 Pressure Support:  [10 cmH20] 10 cmH20 Plateau Pressure:  [18 cmH20-23 cmH20] 18 cmH20  INTAKE / OUTPUT: Intake/Output     05/30 0701 - 05/31 0700 05/31 0701 - 06/01 0700   NG/GT 910    IV Piggyback 400    Total Intake(mL/kg) 1310 (13.7)    Urine (mL/kg/hr) 7175 (3.1)    Total Output 7175     Net -5865          Stool Occurrence 1 x      PHYSICAL EXAMINATION: General:  nad on vent Neuro:  Sleepy but arouses to voice HEENT:  Tracheostomy site intact Cardiovascular:  Regular, no murmurs Lungs:  Bilateral air entry / rhonchi and few expiratory wheezes Abdomen:  Soft, bowel sounds diminished, mild distention, reducible ventral hernia Musculoskeletal:  No edema Skin:  No rash  LABS:  CBC  Recent Labs Lab 04/19/14 0400 04/20/14 0500 04/21/14 0500  WBC 10.2 14.7* 16.8*  HGB 8.6* 8.9* 8.7*  HCT 28.6* 30.5* 28.4*  PLT 467* 504* 499*   Coag's  Recent Labs Lab 04/17/14 0355  INR 1.11   BMET  Recent Labs Lab 04/19/14 0400 04/20/14 0500 04/21/14 0500  NA 143 143 143  K 4.2 4.2 4.2  CL 98 99  100  CO2 38* 36* 35*  BUN 37* 59* 71*  CREATININE 0.59 0.76 0.80  GLUCOSE 119* 143* 180*   Electrolytes  Recent Labs Lab 04/19/14 0400 04/20/14 0500 04/21/14 0500  CALCIUM 8.9 8.8 9.1   Sepsis Markers No results found for this basename: LATICACIDVEN, PROCALCITON, O2SATVEN,  in the last 168 hours ABG  Recent Labs Lab 04/16/14 1600 04/20/14 0945  PHART 7.322* 7.286*  PCO2ART 74.0* 76.4*  PO2ART 78.0* 72.6*   Liver Enzymes  Recent Labs Lab 04/18/14 0500  AST 169*  ALT 126*  ALKPHOS 64  BILITOT 0.3  ALBUMIN 1.6*   Cardiac Enzymes No results found for this basename: TROPONINI, PROBNP,  in the last 168 hours Glucose  Recent Labs Lab  04/21/14 0046 04/21/14 0434 04/21/14 0916 04/21/14 1211 04/21/14 1716 04/21/14 2046  GLUCAP 138* 111* 193* 239* 160* 162*   IMAGING: No results found. ASSESSMENT / PLAN:  PULMONARY A: Acute on chronic respiratory failure Respiratory muscle weakness secondary to cord compression / new T6 paraplegia  COPD with possible exacerbation Suspect OSA / OHS Tracheostomy status P:    Goal SpO2>92 Mechanical vent support Trach collar as tolerated, but suspect will require some degree of mechanical support daily until/ unless she can get in chair DuoNeb / Albuterol / Pulmicort SLP    CARDIOVASCULAR A:  Chronic diastolic heart failure Dyslipidemia P:  Statin held secondary to elevated transaminases  RENAL A:   Stay balance positive   P:   Trend BMP Hold further lasix 5/30 with cvp 6 and bun up  GASTROINTESTINAL A:   Nutrition GERD P:   Protonix as preadmission TF SLP eval  HEMATOLOGIC A:   Anemia VTE Px P:  Trend CBC SCD  INFECTIOUS A:   Bacteremia with COAG NEG STAPH ( in spite of Vancomycin ) Endocarditis? 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 Allergy PCN / cephalosporins P:   ID consultation appreciated Meropenem / Vancomycin per ID      ENDOCRINE  A:   Hyperglycemia Hypothyroidism Lab Results  Component Value Date   TSH 0.222* 03/24/2014    P:   SSI Lantus 15 bid Synthroid Recheck TSH  6/1 >>>  NEUROLOGIC A:   T6 cord compression / paraplegia, s/p spinal cord decompression, still no LE movement Delirium P:   Fentanyl PRN Versed PRN Celexa, Xanax as preadmission   Discussed with husband at bedside, need to confer with NS re mobilization limits - she will definitely wean better in a chair  Christinia Gully, MD Pulmonary and [place] [phone] After 5:30 PM or weekends, call [phone]

## 2014-04-23 ENCOUNTER — Inpatient Hospital Stay (HOSPITAL_COMMUNITY): Payer: Medicaid Other

## 2014-04-23 LAB — BASIC METABOLIC PANEL
BUN: 39 mg/dL — ABNORMAL HIGH (ref 6–23)
CHLORIDE: 102 meq/L (ref 96–112)
CO2: 43 meq/L — AB (ref 19–32)
Calcium: 9 mg/dL (ref 8.4–10.5)
Creatinine, Ser: 0.39 mg/dL — ABNORMAL LOW (ref 0.50–1.10)
GFR calc Af Amer: >90 mL/min (ref >90)
GFR calc non Af Amer: >90 mL/min (ref >90)
GLUCOSE: 117 mg/dL — AB (ref 70–99)
POTASSIUM: 3.7 meq/L (ref 3.7–5.3)
SODIUM: 151 meq/L — AB (ref 137–147)

## 2014-04-23 LAB — CBC
HEMATOCRIT: 28.2 % — AB (ref 36.0–46.0)
Hemoglobin: 8.4 g/dL — ABNORMAL LOW (ref 12.0–15.0)
MCH: 26.3 pg (ref 26.0–34.0)
MCHC: 29.8 g/dL — ABNORMAL LOW (ref 30.0–36.0)
MCV: 88.1 fL (ref 78.0–100.0)
Platelets: 543 10*3/uL — ABNORMAL HIGH (ref 150–400)
RBC: 3.2 MIL/uL — AB (ref 3.87–5.11)
RDW: 20.4 % — ABNORMAL HIGH (ref 11.5–15.5)
WBC: 10.8 10*3/uL — ABNORMAL HIGH (ref 4.0–10.5)

## 2014-04-23 LAB — GLUCOSE, CAPILLARY
GLUCOSE-CAPILLARY: 101 mg/dL — AB (ref 70–99)
GLUCOSE-CAPILLARY: 113 mg/dL — AB (ref 70–99)
GLUCOSE-CAPILLARY: 119 mg/dL — AB (ref 70–99)
Glucose-Capillary: 124 mg/dL — ABNORMAL HIGH (ref 70–99)
Glucose-Capillary: 128 mg/dL — ABNORMAL HIGH (ref 70–99)
Glucose-Capillary: 153 mg/dL — ABNORMAL HIGH (ref 70–99)

## 2014-04-23 LAB — TSH: TSH: 13.21 u[IU]/mL — ABNORMAL HIGH (ref 0.350–4.500)

## 2014-04-23 MED ORDER — LEVOTHYROXINE SODIUM 137 MCG PO TABS
137.0000 ug | ORAL_TABLET | Freq: Every day | ORAL | Status: DC
  Administered 2014-04-24 – 2014-05-18 (×25): 137 ug via ORAL
  Filled 2014-04-23 (×30): qty 1

## 2014-04-23 MED ORDER — DEXTROSE 5 % IV SOLN
INTRAVENOUS | Status: DC
  Administered 2014-04-23: 14:00:00 via INTRAVENOUS
  Administered 2014-04-24: 1000 mL via INTRAVENOUS
  Administered 2014-04-25: 08:00:00 via INTRAVENOUS

## 2014-04-23 NOTE — Progress Notes (Addendum)
PULMONARY / CRITICAL CARE MEDICINE  Name: Joanna Perez MRN: [id number] DOB: [birth year]    ADMISSION DATE:  04/07/2014 CONSULTATION DATE:  04/09/2014  REFERRING MD :  Dr. Annette Stable PRIMARY SERVICE: PCCM  CHIEF COMPLAINT:  Paraplegia   BRIEF PATIENT DESCRIPTION: 58 yo with COPD, CHF, DM, CAD s/p MI, atrial fibrillation transferred from [facility] for BLE paraplegia, found to have T6 fracture with spinal stenosis and spinal cord compression on MRI. She was taken for decompressive surgery and remained on mechanical ventilation post surgery.   SIGNIFICANT EVENTS / STUDIES:  5/16  Admitted to [facility], hypotensive and with new paraplegia 5/17  Transfer to [facility] 5/17  MR Cervical/Thoracic/Lumbar Spine - worsened T6 compression fracture with paraspinal and epidural hematoma causing cord compression and edema, possible early cord infarct 5/18  Intubation for procedure 5/18  Thoracic laminectomy 5/18  Extubated 5/19  Somnolent / hypercarbic >>> reintubated 5/26  Tracheostomy 5/28  TTE >>> Ef 65%, vegetation is not reported  LINES / TUBES: ETT 5/18 >>> 5/18; 5/19 >>>5/26 R R AL 5/17 >>> out Foley 5/16 >>> R PICC 5/22 >>> 5/28 Trach 5/26 (df) >>>   CULTURES: 5/16  Blood >>>  COAG NEG STAPH 5/16  Urine >>> neg 5/25 Sputum >  S maltophilia multiresistant 5/26  Sputum >>> STENOTROPHOMONAS MALTOPHILIA 5/26  Blood >>> COAG NEG STAPH 5/30  Blood x3  >>>   ANTIBIOTICS per ID Vancomycin 5/17 >>> Levaquin 5/17 >>> 5/19 Imipenem 5/28 >>>  INTERVAL HISTORY:   Currently tolerating PS trial well. C/o mild generalized abdominal pain, scratchy throat.   VITAL SIGNS: Temp:  [97.5 F (36.4 C)-98.9 F (37.2 C)] 98.1 F (36.7 C) (06/01 0745) Pulse Rate:  [75-91] 80 (06/01 1016) Resp:  [17-23] 20 (06/01 0745) BP: (138-163)/(67-72) 142/72 mmHg (06/01 1016) SpO2:  [92 %-100 %] 96 % (06/01 0745) FiO2 (%):  [40 %] 40 % (06/01 1016) Weight:  [95.3 kg (210 lb 1.6 oz)] 95.3 kg (210 lb 1.6  oz) (06/01 0504)  HEMODYNAMICS: CVP:  [8 mmHg-10 mmHg] 8 mmHg  VENTILATOR SETTINGS: Vent Mode:  [-] PSV;CPAP FiO2 (%):  [40 %] 40 % Set Rate:  [20 bmp] 20 bmp Vt Set:  [400 mL] 400 mL PEEP:  [5 cmH20] 5 cmH20 Pressure Support:  [10 cmH20] 10 cmH20 Plateau Pressure:  [20 cmH20-22 cmH20] 21 cmH20  INTAKE / OUTPUT: Intake/Output     05/31 0701 - 06/01 0700 06/01 0701 - 06/02 0700   NG/GT 300    IV Piggyback     Total Intake(mL/kg) 300 (3.1)    Urine (mL/kg/hr) 1850 (0.8) 250 (0.6)   Stool 1 (0)    Total Output 1851 250   Net -1551 -250          PHYSICAL EXAMINATION: General:  nad on vent Neuro:  Sleepy but arouses to voice HEENT:  Tracheostomy site intact Cardiovascular:  Regular, no murmurs Lungs:  Bilateral air entry / rhonchi and few expiratory wheezes Abdomen:  Soft, bowel sounds diminished, mild distention, reducible ventral hernia Musculoskeletal:  No edema Skin:  No rash  LABS:  CBC  Recent Labs Lab 04/20/14 0500 04/21/14 0500 04/23/14 0500  WBC 14.7* 16.8* 10.8*  HGB 8.9* 8.7* 8.4*  HCT 30.5* 28.4* 28.2*  PLT 504* 499* 543*   Coag's  Recent Labs Lab 04/17/14 0355  INR 1.11   BMET  Recent Labs Lab 04/20/14 0500 04/21/14 0500 04/23/14 0500  NA 143 143 151*  K 4.2 4.2 3.7  CL  99 100 102  CO2 36* 35* 43*  BUN 59* 71* 39*  CREATININE 0.76 0.80 0.39*  GLUCOSE 143* 180* 117*   Electrolytes  Recent Labs Lab 04/20/14 0500 04/21/14 0500 04/23/14 0500  CALCIUM 8.8 9.1 9.0   Sepsis Markers No results found for this basename: LATICACIDVEN, PROCALCITON, O2SATVEN,  in the last 168 hours ABG  Recent Labs Lab 04/16/14 1600 04/20/14 0945  PHART 7.322* 7.286*  PCO2ART 74.0* 76.4*  PO2ART 78.0* 72.6*   Liver Enzymes  Recent Labs Lab 04/18/14 0500  AST 169*  ALT 126*  ALKPHOS 64  BILITOT 0.3  ALBUMIN 1.6*   Cardiac Enzymes No results found for this basename: TROPONINI, PROBNP,  in the last 168 hours Glucose  Recent Labs Lab  04/22/14 1146 04/22/14 1607 04/22/14 1938 04/23/14 0032 04/23/14 0506 04/23/14 0742  GLUCAP 137* 137* 133* 101* 119* 113*   IMAGING: Dg Chest Port 1 View  04/23/2014   CLINICAL DATA:  Followup atelectasis.  EXAM: PORTABLE CHEST - 1 VIEW  COMPARISON:  04/19/2014  FINDINGS: Tracheostomy tube remains seated. Interval removal right upper extremity PICC. Left IJ central line has an unchanged appearance. Enteric tube which crosses the diaphragm.  Mild cardiomegaly.  No change in mediastinal contours.  Improved aeration at the left base. Persistent hazy density at the right base, obscuring the diaphragm.  IMPRESSION: 1. Right basilar opacity which could represent atelectasis or infection. 2. Improved left lung aeration.   Electronically Signed   By: Jorje Guild M.D.   On: 04/23/2014 06:45   ASSESSMENT / PLAN:  PULMONARY A: Acute on chronic respiratory failure Respiratory muscle weakness secondary to cord compression / new T6 paraplegia  COPD with possible exacerbation Suspect OSA / OHS Tracheostomy status P:    Goal SpO2>92 Mechanical vent support Trach collar as tolerated, but suspect will require some mechanical support daily unless maybe if can get up in chair? Get nuero input? DuoNeb / Albuterol / Pulmicort SLP Remove sutures in am PS goal 10  CARDIOVASCULAR A:  Chronic diastolic heart failure Dyslipidemia P:  Statin held secondary to elevated transaminases tele  RENAL A:   Stay balance positive hypernatremia P:   Trend BMP Dc lasix  Start d5w  GASTROINTESTINAL A:   Nutrition GERD New onset generalized abd pain (suspect constipation as small BM's and refusing senna per RN) P:   Protonix as preadmission KUB Encourage senna TF Consider PEG  HEMATOLOGIC A:   Anemia VTE Px P:  Trend CBC SCD  INFECTIOUS A:   Bacteremia with COAG NEG STAPH ( in spite of Vancomycin ) Endocarditis? 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 Allergy PCN /  cephalosporins P:   ID consultation appreciated Meropenem / Vancomycin per ID consider 14 days steno cosnider to tygac vs imi  ENDOCRINE  A:   Hyperglycemia Hypothyroidism Lab Results  Component Value Date   TSH 13.210* 04/23/2014    P:   SSI Lantus 15 bid Synthroid Increase synthroid  NEUROLOGIC A:   T6 cord compression / paraplegia, s/p spinal cord decompression, still no LE movement Delirium  P:   Fentanyl PRN Versed PRN Celexa, Xanax as preadmission Will d/w NS upright position  Georgann Housekeeper, ACNP [place] [facility] Pager [phone] or [phone]  I have fully examined this patient and agree with above findings.    And edited in full  Lavon Paganini. Titus Mould, MD, [place] Pgr: [place] [facility]

## 2014-04-23 NOTE — Progress Notes (Signed)
CRITICAL VALUE ALERT  Critical value received:  CO2 43  Date of notification:  04/23/2014   Time of notification:  0755  Critical value read back:yes  Nurse who received alert:  Donnella Bi, RN  MD notified (1st page):  Noe Gens, RN is made aware by Mosie Epstein, RN Primary Nurse   Time of first page:  Royetta Car, NP at bedside  MD notified (2nd page):  Time of second page:  Responding MD:  Noe Gens, NP  Time MD responded:  0800

## 2014-04-23 NOTE — Progress Notes (Signed)
Emailed vent SNFs to check on referrals. As of last week, [facility] had no beds and added her to waiting list. [place] vent SNF continues to review; [place] vent SNF reviewing as well. Pt currently has no bed offers from vent SNFs. CSW following and will update pt/family once facility/facilies offer(s).   Ky Barban, MSW, [facility] Clinical Social Worker [phone]

## 2014-04-23 NOTE — Progress Notes (Signed)
SLP Cancellation Note  Patient Details Name: Joanna Perez MRN: [id number] DOB: 1956-11-18   Cancelled treatment:        Continues on ventilator.  ST will follow along for ability for trach collar if able per MD.  Cranford Mon.Ed [employer] [phone]

## 2014-04-23 NOTE — Progress Notes (Signed)
CRITICAL VALUE ALERT  Critical value received:  CO2 (43)  Date of notification:  04/23/2014  Time of notification:  0755  Critical value read back:yes  Nurse who received alert:  It was reported to T. Carbone by [employer]. Did let primary RN aware of result.  MD notified (1st page):  Critical care was  notified by N. Sherle Poe (RN)  Time of first page:  0800  MD notified (2nd page):  Time of second page:  Responding MD:  Dr Alva Garnet  Time MD responded:  [phone]

## 2014-04-23 NOTE — Progress Notes (Addendum)
[facility] for Infectious Disease    Date of Admission:  04/07/2014   Total days of antibiotics 16        Day 16 vanco        Day 5 imi           ID: Joanna Perez is a 58 y.o. 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 ALPRAZolam  1 mg Oral TID  . antiseptic oral rinse  15 mL Mouth Rinse QID  . budesonide (PULMICORT) nebulizer solution  0.5 mg Nebulization BID  . chlorhexidine  15 mL Mouth Rinse BID  . cholecalciferol  400 Units Oral Daily  . citalopram  20 mg Oral Daily  . feeding supplement (VITAL HIGH PROTEIN)  1,000 mL Per Tube Q24H  . guaiFENesin  600 mg Oral Daily  . imipenem-cilastatin  500 mg Intravenous 4 times per day  . insulin aspart  0-15 Units Subcutaneous 6 times per day  . insulin glargine  15 Units Subcutaneous BID  . ipratropium-albuterol  3 mL Nebulization BID  . levothyroxine  112 mcg Oral QAC breakfast  . pantoprazole sodium  40 mg Per Tube Daily  . predniSONE  10 mg Oral Q breakfast  . senna  1 tablet Oral BID  . sodium chloride  10-40 mL Intracatheter Q12H  . vancomycin  1,000 mg Intravenous Q24H  . vecuronium  10 mg Intravenous Once    Objective: Vital signs in last 24 hours: Temp:  [97.5 F (36.4 C)-98.9 F (37.2 C)] 98.1 F (36.7 C) (06/01 0745) Pulse Rate:  [75-91] 82 (06/01 0745) Resp:  [17-23] 20 (06/01 0745) BP: (138-163)/(67-69) 138/67 mmHg (06/01  0745) SpO2:  [92 %-100 %] 96 % (06/01 0745) FiO2 (%):  [40 %] 40 % (06/01 0745) Weight:  [210 lb 1.6 oz (95.3 kg)] 210 lb 1.6 oz (95.3 kg) (06/01 0504) Physical Exam  Constitutional:  alert appears well-developed and well-nourished. No distress.  HENT: trach in place. dophoff in place Mouth/Throat: Oropharynx is clear and moist. No oropharyngeal exudate. Dry oral pharynx. No thrush Cardiovascular: Normal rate, regular rhythm and normal heart sounds. Exam reveals no gallop and no friction rub.  No murmur heard.  Pulmonary/Chest: Effort normal and breath sounds normal. No respiratory distress.  Mild expiratory rhonchi Abdominal: Soft. Bowel sounds are normal.  exhibits no distension. There is no tenderness.  Lymphadenopathy: no cervical adenopathy.  Skin: Skin is warm and dry. No rash noted.scattered echymosis  Lab Results  Recent Labs  04/21/14 0500 04/23/14 0500  WBC 16.8* 10.8*  HGB 8.7* 8.4*  HCT 28.4* 28.2*  NA 143 151*  K 4.2 3.7  CL 100 102  CO2 35* 43*  BUN 71* 39*  CREATININE 0.80 0.39*   Liver Panel No results found for this basename: PROT, ALBUMIN, AST, ALT, ALKPHOS, BILITOT, BILIDIR, IBILI,  in the last 72 hours Sedimentation Rate No results found for this basename:  ESRSEDRATE,  in the last 72 hours C-Reactive Protein No results found for this basename: CRP,  in the last 72 hours  Microbiology: 5/30 blood cx x 2 PENDING 5/26 resp cx steno R tmp/smx R FQ 5/26 blood cx x 2 MSSE 5/16 blood cx x 2 MSSE Studies/Results: Dg Chest Port 1 View  04/23/2014   CLINICAL DATA:  Followup atelectasis.  EXAM: PORTABLE CHEST - 1 VIEW  COMPARISON:  04/19/2014  FINDINGS: Tracheostomy tube remains seated. Interval removal right upper extremity PICC. Left IJ central line has an unchanged appearance. Enteric tube which crosses the diaphragm.  Mild cardiomegaly.  No change in mediastinal contours.  Improved aeration at the left base. Persistent hazy density at the right base, obscuring  the diaphragm.  IMPRESSION: 1. Right basilar opacity which could represent atelectasis or infection. 2. Improved left lung aeration.   Electronically Signed   By: Jorje Guild M.D.   On: 04/23/2014 06:45   Echo on 5/28: no vegetations noted   Assessment/Plan: 1) MSSE bacteremia = she has had 2 episodes of CoNS bacteremia ( 2/2 + on 5/16 then again on 5/26), concern for endocarditis. Surprisingly 2nd time + on 5/26 was while she was on therapeutic vancomycin. She has been on meropenem x 5 days (due to PCN allergy), repeat blood cx from 5/30 remain NGTD. TTE although less sensitive shows no vegetation. Need to decide to treat for presumed endocarditis vs. 2 wk course for bacteremia. Favor the treatment for bacteremia, since it could possibly be due to her picc line that has been removed and repeat cx are NGTD. Will treat for 2 addn weeks with vanco using 5/30 as day #1 of 14. Thus end date is June 13th.  2) leukocytosis = improved, which coincided with addition of meropenem. Will d/c imipenem  3) stenotroph respiratory colonization = for now will continue to monitor cxr and clinical picture for hcap.   Will sign off. Call if questions  [facility] for Infectious Diseases Cell: [phone] Pager: [phone]  04/23/2014, 9:16 AM

## 2014-04-23 NOTE — Progress Notes (Signed)
Patient ID: Joanna Perez, female   DOB: 1956-04-07, 58 y.o.   MRN: [id number]   Request for percutaneous G tube rec'd  Pt is paraplegic Post T6 fracture and decompression surgery. Chronic resp failure with trach Protein calorie malnutrition +bacteremia 5/26 Still pending final result of 5/30 BCx (neg so far)  Pt is afeb Wbc wnl On Vancomycin Will await final BCx result  Plan for perc G tube then  Will follow

## 2014-04-24 LAB — BASIC METABOLIC PANEL
BUN: 29 mg/dL — AB (ref 6–23)
CO2: 43 mEq/L (ref 19–32)
CREATININE: 0.34 mg/dL — AB (ref 0.50–1.10)
Calcium: 8.6 mg/dL (ref 8.4–10.5)
Chloride: 100 mEq/L (ref 96–112)
GFR calc Af Amer: >90 mL/min (ref >90)
GLUCOSE: 134 mg/dL — AB (ref 70–99)
Potassium: 3.8 mEq/L (ref 3.7–5.3)
Sodium: 146 mEq/L (ref 137–147)

## 2014-04-24 LAB — GLUCOSE, CAPILLARY
GLUCOSE-CAPILLARY: 149 mg/dL — AB (ref 70–99)
GLUCOSE-CAPILLARY: 178 mg/dL — AB (ref 70–99)
Glucose-Capillary: 120 mg/dL — ABNORMAL HIGH (ref 70–99)
Glucose-Capillary: 127 mg/dL — ABNORMAL HIGH (ref 70–99)
Glucose-Capillary: 198 mg/dL — ABNORMAL HIGH (ref 70–99)

## 2014-04-24 NOTE — Progress Notes (Signed)
[facility] SNF in [place] has one bed available and states pt is currently not clinically appropriate to go to the semi-private room they have with another pt. Facility holding on to referral in the event a different bed opens. [facility] is reviewing clinicals and states they currently do not have a bed available. [facility] states clinically pt qualifies for a vent SNF bed once her FiO2 is at or below 45%. Ideally pt could wean to trach and go to trach SNF. CSW spoke with pt's husband and informed him of placement barriers at vent SNFs currently and plan to PEG pt tomorrow morning.   Ky Barban, MSW, [facility] Clinical Social Worker [phone]

## 2014-04-24 NOTE — Progress Notes (Signed)
Overall situation essentially unchanged since I last visit the patient. She continues to be ventilator dependent although she is recently had a tracheostomy. She still has no voluntary movement in either lower extremity. She does have some mild preservation of sensation in both lower extremities. Her wound is currently clean and dry.  Impression thoracic paraplegia secondary to fracture/stenosis unrelieved by emergent decompressive surgery. For my standpoint patient may be mobilized to a chair without restriction. Hopefully this will help her weaning process. I have no other recommendations at this time.

## 2014-04-24 NOTE — Progress Notes (Signed)
PULMONARY / CRITICAL CARE MEDICINE  Name: Joanna Perez MRN: [id number] DOB: 1956/02/09    ADMISSION DATE:  04/07/2014 CONSULTATION DATE:  04/09/2014  REFERRING MD :  Dr. Annette Stable PRIMARY SERVICE: PCCM  CHIEF COMPLAINT:  Paraplegia   BRIEF PATIENT DESCRIPTION: 58 yo with COPD, CHF, DM, CAD s/p MI, atrial fibrillation transferred from [facility] for BLE paraplegia, found to have T6 fracture with spinal stenosis and spinal cord compression on MRI. She was taken for decompressive surgery and remained on mechanical ventilation post surgery.   SIGNIFICANT EVENTS / STUDIES:  5/16  Admitted to [facility], hypotensive and with new paraplegia 5/17  Transfer to [facility] 5/17  MR Cervical/Thoracic/Lumbar Spine - worsened T6 compression fracture with paraspinal and epidural hematoma causing cord compression and edema, possible early cord infarct 5/18  Intubation for procedure 5/18  Thoracic laminectomy 5/18  Extubated 5/19  Somnolent / hypercarbic >>> reintubated 5/26  Tracheostomy 5/28  TTE >>> Ef 65%, vegetation is not reported  LINES / TUBES: ETT 5/18 >>> 5/18; 5/19 >>>5/26 R R AL 5/17 >>> out Foley 5/16 >>> R PICC 5/22 >>> 5/28 Trach 5/26 (df) >>>  CULTURES: 5/16  Blood >>>  COAG NEG STAPH 5/16  Urine >>> neg 5/25 Sputum >  S maltophilia multiresistant 5/26  Sputum >>> STENOTROPHOMONAS MALTOPHILIA 5/26  Blood >>> COAG NEG STAPH 5/30  Blood x3  >>>  ANTIBIOTICS per ID Vancomycin 5/17 >>> Levaquin 5/17 >>> 5/19 Imipenem 5/28 >>>6/1  INTERVAL HISTORY:  Pt reports back pain.  Weaning on 10/5 with good volumes  VITAL SIGNS: Temp:  [96.8 F (36 C)-98.8 F (37.1 C)] 97.6 F (36.4 C) (06/02 0744) Pulse Rate:  [58-88] 84 (06/02 0801) Resp:  [17-24] 22 (06/02 0801) BP: (105-164)/(49-80) 126/59 mmHg (06/02 0801) SpO2:  [93 %-100 %] 97 % (06/02 0801) FiO2 (%):  [40 %] 40 % (06/02 0816) Weight:  [207 lb 7.3 oz (94.1 kg)] 207 lb 7.3 oz (94.1 kg) (06/02 0415)  HEMODYNAMICS: CVP:   [10 mmHg-12 mmHg] 10 mmHg  VENTILATOR SETTINGS: Vent Mode:  [-] CPAP;PSV FiO2 (%):  [40 %] 40 % Set Rate:  [20 bmp] 20 bmp Vt Set:  [400 mL] 400 mL PEEP:  [5 cmH20] 5 cmH20 Pressure Support:  [10 cmH20] 10 cmH20 Plateau Pressure:  [15 cmH20-23 cmH20] 22 cmH20  INTAKE / OUTPUT: Intake/Output     06/01 0701 - 06/02 0700 06/02 0701 - 06/03 0700   I.V. (mL/kg) 808.3 (8.6)    NG/GT 1150    Total Intake(mL/kg) 1958.3 (20.8)    Urine (mL/kg/hr) 1900 (0.8) 200 (0.5)   Stool     Total Output 1900 200   Net +58.3 -200        Stool Occurrence  1 x     PHYSICAL EXAMINATION: General:  nad on vent Neuro:  AAOx4, communicates on vent HEENT:  Tracheostomy site intact Cardiovascular:  Regular, no murmurs Lungs:  Bilateral air entry with few scattered rhonchi Abdomen:  Soft, bowel sounds diminished, mild distention, reducible ventral hernia Musculoskeletal:  No edema Skin:  No rash  LABS:  CBC  Recent Labs Lab 04/20/14 0500 04/21/14 0500 04/23/14 0500  WBC 14.7* 16.8* 10.8*  HGB 8.9* 8.7* 8.4*  HCT 30.5* 28.4* 28.2*  PLT 504* 499* 543*   BMET  Recent Labs Lab 04/21/14 0500 04/23/14 0500 04/24/14 0415  NA 143 151* 146  K 4.2 3.7 3.8  CL 100 102 100  CO2 35* 43* 43*  BUN 71* 39* 29*  CREATININE 0.80 0.39* 0.34*  GLUCOSE 180* 117* 134*   Electrolytes  Recent Labs Lab 04/21/14 0500 04/23/14 0500 04/24/14 0415  CALCIUM 9.1 9.0 8.6   ABG  Recent Labs Lab 04/20/14 0945  PHART 7.286*  PCO2ART 76.4*  PO2ART 72.6*   Glucose  Recent Labs Lab 04/23/14 1326 04/23/14 1550 04/23/14 2024 04/24/14 0028 04/24/14 0413 04/24/14 0742  GLUCAP 124* 128* 153* 127* 120* 149*   IMAGING: Dg Chest Port 1 View  04/23/2014   CLINICAL DATA:  Followup atelectasis.  EXAM: PORTABLE CHEST - 1 VIEW  COMPARISON:  04/19/2014  FINDINGS: Tracheostomy tube remains seated. Interval removal right upper extremity PICC. Left IJ central line has an unchanged appearance. Enteric tube  which crosses the diaphragm.  Mild cardiomegaly.  No change in mediastinal contours.  Improved aeration at the left base. Persistent hazy density at the right base, obscuring the diaphragm.  IMPRESSION: 1. Right basilar opacity which could represent atelectasis or infection. 2. Improved left lung aeration.   Electronically Signed   By: Jorje Guild M.D.   On: 04/23/2014 06:45   Dg Abd Portable 1v  04/23/2014   CLINICAL DATA:  Abdominal pain  EXAM: PORTABLE ABDOMEN - 1 VIEW  COMPARISON:  04/18/2014  FINDINGS: Feeding tube within the mid stomach body. Normal bowel gas pattern. No acute osseous finding or abnormal calcification.  IMPRESSION: Feeding tube within the stomach.   Electronically Signed   By: Daryll Brod M.D.   On: 04/23/2014 12:55   ASSESSMENT / PLAN:  PULMONARY A: Acute on chronic respiratory failure Respiratory muscle weakness secondary to cord compression / new T6 paraplegia  COPD with possible exacerbation Suspect OSA / OHS Tracheostomy status P:    Goal SpO2>92 Will attempt to avoid nocturnal vent in the future, currently PS 10 , goal to 5 , if successful then abg at 5 am when off vent in future DuoNeb / Albuterol / Pulmicort SLP Sutures removed 6/2   CARDIOVASCULAR A:  Chronic diastolic heart failure Dyslipidemia P:  Statin held secondary to elevated transaminases Tele D/C CVP  RENAL A:   Stay balance positive Hypernatremia - improving P:   Trend BMP D5w, consider d/c am 6/3  GASTROINTESTINAL A:   Nutrition GERD New onset generalized abd pain (suspect constipation as small BM's and refusing senna per RN) P:   Protonix as preadmission KUB Encourage senna TF Will likely need PEG - ordered for am   HEMATOLOGIC A:   Anemia VTE Px P:  Trend CBC SCD  INFECTIOUS A:   Bacteremia with COAG NEG STAPH ( in spite of Vancomycin ) R/O Endocarditis - no vegetation noted on ECHO 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 Allergy PCN /  cephalosporins P:   ID consultation appreciated Meropenem / Vancomycin per ID, note reviewed colinizer, dc imi vanc still required, see duration ID note consider to tygac vs imi  ENDOCRINE  A:   Hyperglycemia Hypothyroidism   P:   SSI Lantus 15 bid Synthroid Increase synthroid, repeat check in 4 wks (~7/2)  NEUROLOGIC A:   T6 cord compression / paraplegia, s/p spinal cord decompression, still no LE movement Delirium  P:   Fentanyl PRN Versed PRN Celexa, Xanax as preadmission Need to discuss with NS regarding upright position   Noe Gens, NP-C [place] [facility] Pgr: [phone] or [phone]   I have fully examined this patient and agree with above findings.    And edited in full  Lavon Paganini. Titus Mould, MD, [place] Pgr: [phone] [place]  Pulmonary & Critical Care

## 2014-04-25 ENCOUNTER — Inpatient Hospital Stay (HOSPITAL_COMMUNITY): Payer: Medicaid Other

## 2014-04-25 LAB — BASIC METABOLIC PANEL
BUN: 22 mg/dL (ref 6–23)
CALCIUM: 8.7 mg/dL (ref 8.4–10.5)
CO2: 43 mEq/L (ref 19–32)
Chloride: 96 mEq/L (ref 96–112)
Creatinine, Ser: 0.33 mg/dL — ABNORMAL LOW (ref 0.50–1.10)
GFR calc Af Amer: >90 mL/min (ref >90)
GLUCOSE: 143 mg/dL — AB (ref 70–99)
Potassium: 4.1 mEq/L (ref 3.7–5.3)
SODIUM: 142 meq/L (ref 137–147)

## 2014-04-25 LAB — GLUCOSE, CAPILLARY
GLUCOSE-CAPILLARY: 177 mg/dL — AB (ref 70–99)
GLUCOSE-CAPILLARY: 148 mg/dL — AB (ref 70–99)
GLUCOSE-CAPILLARY: 122 mg/dL — AB (ref 70–99)
Glucose-Capillary: 128 mg/dL — ABNORMAL HIGH (ref 70–99)
Glucose-Capillary: 147 mg/dL — ABNORMAL HIGH (ref 70–99)
Glucose-Capillary: 153 mg/dL — ABNORMAL HIGH (ref 70–99)
Glucose-Capillary: 166 mg/dL — ABNORMAL HIGH (ref 70–99)
Glucose-Capillary: 174 mg/dL — ABNORMAL HIGH (ref 70–99)

## 2014-04-25 LAB — CBC
HCT: 27.1 % — ABNORMAL LOW (ref 36.0–46.0)
Hemoglobin: 8.1 g/dL — ABNORMAL LOW (ref 12.0–15.0)
MCH: 26.4 pg (ref 26.0–34.0)
MCHC: 29.9 g/dL — ABNORMAL LOW (ref 30.0–36.0)
MCV: 88.3 fL (ref 78.0–100.0)
PLATELETS: 550 10*3/uL — AB (ref 150–400)
RBC: 3.07 MIL/uL — ABNORMAL LOW (ref 3.87–5.11)
RDW: 20.5 % — AB (ref 11.5–15.5)
WBC: 10.8 10*3/uL — AB (ref 4.0–10.5)

## 2014-04-25 MED ORDER — FUROSEMIDE 10 MG/ML IJ SOLN
20.0000 mg | Freq: Two times a day (BID) | INTRAMUSCULAR | Status: DC
  Administered 2014-04-25 – 2014-04-26 (×3): 20 mg via INTRAVENOUS
  Filled 2014-04-25 (×3): qty 2

## 2014-04-25 MED ORDER — FUROSEMIDE 10 MG/ML IJ SOLN
INTRAMUSCULAR | Status: AC
  Filled 2014-04-25: qty 4

## 2014-04-25 MED ORDER — INSULIN GLARGINE 100 UNIT/ML ~~LOC~~ SOLN
10.0000 [IU] | Freq: Two times a day (BID) | SUBCUTANEOUS | Status: DC
  Administered 2014-04-25: 10 [IU] via SUBCUTANEOUS
  Filled 2014-04-25 (×3): qty 0.1

## 2014-04-25 NOTE — Progress Notes (Signed)
PULMONARY / CRITICAL CARE MEDICINE  Name: Joanna Perez MRN: [id number] DOB: October 14, 1956    ADMISSION DATE:  04/07/2014 CONSULTATION DATE:  04/09/2014  REFERRING MD :  Dr. Annette Stable PRIMARY SERVICE: PCCM  CHIEF COMPLAINT:  Paraplegia   BRIEF PATIENT DESCRIPTION: 58 yo with COPD, CHF, DM, CAD s/p MI, atrial fibrillation transferred from [facility] for BLE paraplegia, found to have T6 fracture with spinal stenosis and spinal cord compression on MRI. She was taken for decompressive surgery and remained on mechanical ventilation post surgery.   SIGNIFICANT EVENTS / STUDIES:  5/16  Admitted to [facility], hypotensive and with new paraplegia 5/17  Transfer to [facility] 5/17  MR Cervical/Thoracic/Lumbar Spine - worsened T6 compression fracture with paraspinal and epidural hematoma causing cord compression and edema, possible early cord infarct 5/18  Intubation for procedure 5/18  Thoracic laminectomy 5/18  Extubated 5/19  Somnolent / hypercarbic >>> reintubated 5/26  Tracheostomy 5/28  TTE >>> Ef 65%, vegetation is not reported  LINES / TUBES: ETT 5/18 >>> 5/18; 5/19 >>>5/26 R R AL 5/17 >>> out Foley 5/16 >>> R PICC 5/22 >>> 5/28 Trach 5/26 (df) >>>  CULTURES: 5/16  Blood >>>  COAG NEG STAPH 5/16  Urine >>> neg 5/25 Sputum >  S maltophilia multiresistant 5/26  Sputum >>> STENOTROPHOMONAS MALTOPHILIA 5/26  Blood >>> COAG NEG STAPH 5/30  Blood x3  >>>  ANTIBIOTICS per ID Vancomycin 5/17 >>> Levaquin 5/17 >>> 5/19 Imipenem 5/28 >>>6/1  INTERVAL HISTORY:  Pos balance  VITAL SIGNS: Temp:  [97.6 F (36.4 C)-98.5 F (36.9 C)] 98.5 F (36.9 C) (06/03 1217) Pulse Rate:  [49-104] 92 (06/03 1217) Resp:  [20-35] 31 (06/03 1217) BP: (101-138)/(48-98) 107/61 mmHg (06/03 1217) SpO2:  [95 %-99 %] 96 % (06/03 1217) FiO2 (%):  [40 %] 40 % (06/03 1217) Weight:  [91.9 kg (202 lb 9.6 oz)] 91.9 kg (202 lb 9.6 oz) (06/03 0330)  HEMODYNAMICS: CVP:  [7 mmHg-16 mmHg] 7 mmHg  VENTILATOR  SETTINGS: Vent Mode:  [-] CPAP;PSV FiO2 (%):  [40 %] 40 % Set Rate:  [20 bmp] 20 bmp PEEP:  [5 cmH20] 5 cmH20 Pressure Support:  [5 cmH20-10 cmH20] 10 cmH20 Plateau Pressure:  [20 cmH20-25 cmH20] 20 cmH20  INTAKE / OUTPUT: Intake/Output     06/02 0701 - 06/03 0700 06/03 0701 - 06/04 0700   I.V. (mL/kg) 1160 (12.6) 200 (2.2)   NG/GT 1150 200   IV Piggyback 400    Total Intake(mL/kg) 2710 (29.5) 400 (4.4)   Urine (mL/kg/hr) 1350 (0.6) 1100 (1.8)   Total Output 1350 1100   Net +1360 -700        Stool Occurrence 1 x      PHYSICAL EXAMINATION: General:  nad on vent Neuro:  AAOx4, communicates on vent HEENT:  Tracheostomy site intact clean, dry Cardiovascular:  s1 s2 RRR Lungs:  Reduced bases Abdomen:  Soft, bowel sounds wnl, obese, reducible ventral hernia Musculoskeletal:  No edema Skin:  No rash  LABS:  CBC  Recent Labs Lab 04/21/14 0500 04/23/14 0500 04/25/14 0350  WBC 16.8* 10.8* 10.8*  HGB 8.7* 8.4* 8.1*  HCT 28.4* 28.2* 27.1*  PLT 499* 543* 550*   BMET  Recent Labs Lab 04/23/14 0500 04/24/14 0415 04/25/14 0350  NA 151* 146 142  K 3.7 3.8 4.1  CL 102 100 96  CO2 43* 43* 43*  BUN 39* 29* 22  CREATININE 0.39* 0.34* 0.33*  GLUCOSE 117* 134* 143*   Electrolytes  Recent Labs  Lab 04/23/14 0500 04/24/14 0415 04/25/14 0350  CALCIUM 9.0 8.6 8.7   ABG  Recent Labs Lab 04/20/14 0945  PHART 7.286*  PCO2ART 76.4*  PO2ART 72.6*   Glucose  Recent Labs Lab 04/24/14 1618 04/24/14 2038 04/24/14 2354 04/25/14 0419 04/25/14 0751 04/25/14 1216  GLUCAP 198* 177* 166* 153* 128* 147*   IMAGING: Dg Chest Port 1 View  04/25/2014   CLINICAL DATA:  Assess tracheostomy in airspace disease  EXAM: PORTABLE CHEST - 1 VIEW  COMPARISON:  04/23/2014  FINDINGS: Unchanged positioning of left IJ central venous catheter, tip near the upper SVC. There is a tracheostomy to which appears well-seated. A feeding tube crosses the diaphragm.  Unchanged appearance of a  right basilar opacity, obscuring the diaphragm. There could also be small opacification of the retrocardiac lung. No effusion or visible pneumothorax. No pulmonary edema.  IMPRESSION: 1. Stable positioning of tubes and central line. 2. Persistent right basilar pneumonia or atelectasis.   Electronically Signed   By: Jorje Guild M.D.   On: 04/25/2014 05:22   ASSESSMENT / PLAN:  PULMONARY A: Acute on chronic respiratory failure Respiratory muscle weakness secondary to cord compression / new T6 paraplegia  COPD with possible exacerbation Suspect OSA / OHS Tracheostomy status P:    Goal SpO2>92 Weaning ps 10, appears better today, clearly NOT ready for trach collar Neg balance today with lasix DuoNeb / Albuterol / Pulmicort SLP   CARDIOVASCULAR A:  Chronic diastolic heart failure Dyslipidemia P:  Statin held secondary to elevated transaminases, repeat them end of week in hopes to restart Tele lasix  RENAL A:   Stay balance positive Hypernatremia - improving P:   Trend BMP in am with start lasix D5w, consider d/c today Lasix to neg 1 liter goal, hope to improve weaning  GASTROINTESTINAL A:   Nutrition GERD New onset generalized abd pain (suspect constipation as small BM's and refusing senna per RN) dysphagia P:   Protonix as preadmission senna TF PEG planned per IR< need NPO status when time known  HEMATOLOGIC A:   Anemia VTE Px P:  Trend CBC with laisx, may concentrate SCD  INFECTIOUS A:   Bacteremia with COAG NEG STAPH ( in spite of Vancomycin ) R/O Endocarditis - no vegetation noted on ECHO 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 Allergy PCN / cephalosporins P:   Vancomycin per ID vanc still required, see duration ID note  ENDOCRINE  A:   Hyperglycemia, now normal on lantus, low 128 Hypothyroidism   P:   SSI Lantus 15 bid, reduce Synthroid Increase synthroid, repeat check in 4 wks (~7/2)  NEUROLOGIC A:   T6 cord compression  / paraplegia, s/p spinal cord decompression, still no LE movement Delirium resolved  P:   Fentanyl PRN Versed PRN Celexa, Xanax as preadmission will mobilize, to chair as goal Hope improves vent weaning, likely will  I have fully examined this patient and agree with above findings.    And edited in full  Lavon Paganini. Titus Mould, MD, [place] Pgr: [place] [facility]

## 2014-04-25 NOTE — Progress Notes (Signed)
NUTRITION FOLLOW UP  DOCUMENTATION CODES Per approved criteria  -Obesity Unspecified   INTERVENTION: Continue current TF regimen RD to follow for nutrition care plan  NUTRITION DIAGNOSIS: Inadequate oral intake related to inability to eat as evidenced by NPO status, ongoing  Goal: Enteral nutrition to provide 60-70% of estimated calorie needs (22-25 kcals/kg ideal body weight) and 100% of estimated protein needs, based on ASPEN guidelines for permissive underfeeding in critically ill obese individuals, met  Monitor:  TF regimen & tolerance, respiratory status, weight, labs, I/O's  ASSESSMENT: Pt admitted with altered mental status and COPD exacerbation. Pt is status post thoracic decompressive surgery for severe myelopathy with subacute paraplegia on 5/18. Per MD note prognosis for neurological recovery is very poor.   Patient transferred to 2C-Stepdown from 63M-Neuro ICU 5/27.  Patient is currently on ventilatory support via trach MV: 8.4 L/min Temp (24hrs), Avg:98 F (36.7 C), Min:97.6 F (36.4 C), Max:98.5 F (36.9 C)   Vital HP formula continues to infuse at 50 ml/hr via NGT (tip in mid portion of stomach) providing 1200 kcal (24 kcal/kg IBW), 105 grams of protein (100% of estimated protein needs) and 1003 ml of water.  IR consulted for G-tube placement, scheduled for 6/4.  Disposition: vent SNF vs LTACH.  Height: Ht Readings from Last 1 Encounters:  04/09/14 $RemoveB'5\' 2"'TeUXOiLi$  (1.575 m)    Weight -----> fluctuating  Wt Readings from Last 1 Encounters:  04/25/14 202 lb 9.6 oz (91.9 kg)    6/02  207 lb 6/01  210 lb 5/31  210 lb 5/30  217 lb 5/29  224 lb 5/28  217 lb 5/27  211 lb 5/26  207 lb 5/25  212 lb 5/24  212 lb 5/23  210 lb  BMI:  Body mass index is 37.05 kg/(m^2).  Re-estimated needs: Kcal: 1879 Protein: 100-110 gm Fluid: per MD   Skin: stage II pressure ulcer on sacrum  Diet Order: NPO   Intake/Output Summary (Last 24 hours) at 04/25/14 1403 Last  data filed at 04/25/14 1218  Gross per 24 hour  Intake   2610 ml  Output   1900 ml  Net    710 ml    Labs:   Recent Labs Lab 04/23/14 0500 04/24/14 0415 04/25/14 0350  NA 151* 146 142  K 3.7 3.8 4.1  CL 102 100 96  CO2 43* 43* 43*  BUN 39* 29* 22  CREATININE 0.39* 0.34* 0.33*  CALCIUM 9.0 8.6 8.7  GLUCOSE 117* 134* 143*    CBG (last 3)   Recent Labs  04/25/14 0419 04/25/14 0751 04/25/14 1216  GLUCAP 153* 128* 147*    Scheduled Meds: . ALPRAZolam  1 mg Oral TID  . antiseptic oral rinse  15 mL Mouth Rinse QID  . budesonide (PULMICORT) nebulizer solution  0.5 mg Nebulization BID  . chlorhexidine  15 mL Mouth Rinse BID  . cholecalciferol  400 Units Oral Daily  . citalopram  20 mg Oral Daily  . feeding supplement (VITAL HIGH PROTEIN)  1,000 mL Per Tube Q24H  . furosemide  20 mg Intravenous Q12H  . insulin aspart  0-15 Units Subcutaneous 6 times per day  . insulin glargine  10 Units Subcutaneous BID  . ipratropium-albuterol  3 mL Nebulization BID  . levothyroxine  137 mcg Oral QAC breakfast  . pantoprazole sodium  40 mg Per Tube Daily  . predniSONE  10 mg Oral Q breakfast  . senna  1 tablet Oral BID  . sodium chloride  10-40  mL Intracatheter Q12H  . vancomycin  1,000 mg Intravenous Q24H  . vecuronium  10 mg Intravenous Once    Continuous Infusions:   Arthur Holms, RD, LDN Pager #: [phone] After-Hours Pager #: [phone]

## 2014-04-25 NOTE — Progress Notes (Signed)
SLP Cancellation Note  Patient Details Name: Joanna Perez MRN: [id number] DOB: 1956/06/19   Cancelled treatment:        Pt. Remains on vent and likely longer term as pt. Will need LTAC at d/c.     Orbie Pyo Lillie Bollig 04/25/2014, 12:26 PM

## 2014-04-25 NOTE — Progress Notes (Signed)
CRITICAL VALUE ALERT  Critical value received:  Co2:43  Date of notification:  04/25/2014  Time of notification:  1607  Critical value read back:yes  Nurse who received alert:  Annice Pih  MD notified (1st page):  Called PCCM and spoke to Dr. Earnest Conroy.. No new orders at this time, will continue to monitor.  Time of first page:  0444  MD notified (2nd page):  Time of second page:  Responding MD:    Time MD responded:

## 2014-04-25 NOTE — Progress Notes (Signed)
Joanna CONSULT NOTE - FOLLOW UP  Pharmacy Consult for vancomycin Indication: CNS bacteremia with PCN allergy  Allergies  Allergen Reactions  . Amoxicillin Hives  . Bactrim [Sulfamethoxazole-Tmp Ds] Hives  . Erythromycin Hives  . Keflex [Cephalexin] Hives  . Penicillins Itching and Swelling    Sweating    Patient Measurements: Height: 5\' 2"  (157.5 cm) Weight: 202 lb 9.6 oz (91.9 kg) IBW/kg (Calculated) : 50.1 Vital Signs: Temp: 98.5 F (36.9 C) (06/03 1217) Temp src: Oral (06/03 1217) BP: 107/61 mmHg (06/03 1217) Pulse Rate: 92 (06/03 1217) Intake/Output from previous day: 06/02 0701 - 06/03 0700 In: 2710 [I.V.:1160; NG/GT:1150; IV Piggyback:400] Out: 1350 [Urine:1350] Intake/Output from this shift: Total I/O In: 400 [I.V.:200; NG/GT:200] Out: 1100 [Urine:1100]  Labs:  Recent Labs  04/23/14 0500 04/24/14 0415 04/25/14 0350  WBC 10.8*  --  10.8*  HGB 8.4*  --  8.1*  PLT 543*  --  550*  CREATININE 0.39* 0.34* 0.33*   Estimated Creatinine Clearance: 81.8 ml/min (by C-G formula based on Cr of 0.33).  Recent Labs  04/22/14 1605  VANCOTROUGH 33.8*     Microbiology: Recent Results (from the past 720 hour(s))  URINE CULTURE     Status: None   Collection Time    04/07/14 11:50 AM      Result Value Ref Range Status   Specimen Description URINE, CATHETERIZED   Final   Special Requests NONE   Final   Culture  Setup Time     Final   Value: 04/08/2014 01:35     Performed at [place]     Final   Value: NO GROWTH     Performed at [facility]   Culture     Final   Value: NO GROWTH     Performed at [facility]   Report Status 04/09/2014 FINAL   Final  CULTURE, BLOOD (ROUTINE X 2)     Status: None   Collection Time    04/07/14 12:02 PM      Result Value Ref Range Status   Specimen Description BLOOD RIGHT ANTECUBITAL   Final   Special Requests BOTTLES DRAWN AEROBIC ONLY [facility]   Final   Culture  Setup Time      Final   Value: 04/08/2014 21:13     Performed at [facility]   Culture     Final   Value: STAPHYLOCOCCUS SPECIES (COAGULASE NEGATIVE)     Note: RIFAMPIN AND GENTAMICIN SHOULD NOT BE USED AS SINGLE DRUGS FOR TREATMENT OF STAPH INFECTIONS.     Note: Performed at [facility]     Performed at [facility]   Report Status 04/10/2014 FINAL   Final   Organism ID, Bacteria STAPHYLOCOCCUS SPECIES (COAGULASE NEGATIVE)   Final  CULTURE, BLOOD (ROUTINE X 2)     Status: None   Collection Time    04/07/14 12:02 PM      Result Value Ref Range Status   Specimen Description BLOOD RIGHT HAND   Final   Special Requests BOTTLES DRAWN AEROBIC AND ANAEROBIC 8CC   Final   Culture  Setup Time     Final   Value: 04/08/2014 21:16     Performed at [facility]   Culture     Final   Value: STAPHYLOCOCCUS SPECIES (COAGULASE NEGATIVE)     Note: SUSCEPTIBILITIES PERFORMED ON PREVIOUS CULTURE WITHIN THE LAST 5 DAYS.     Note: Performed at [facility]  Hospital Gram Stain Report Called to,Read Back By and Verified With: STONE A 04/08/14 0811 BY THOMPSON S     Performed at [facility]   Report Status 04/10/2014 FINAL   Final  SURGICAL PCR SCREEN     Status: None   Collection Time    04/08/14  7:54 PM      Result Value Ref Range Status   MRSA, PCR NEGATIVE  NEGATIVE Final   Staphylococcus aureus NEGATIVE  NEGATIVE Final   Comment:            The Xpert SA Assay (FDA     approved for NASAL specimens     in patients over 27 years of age),     is one component of     a comprehensive surveillance     program.  Test performance has     been validated by [employer] for patients greater     than or equal to 9 year old.     It is not intended     to diagnose infection nor to     guide or monitor treatment.  CULTURE, RESPIRATORY (NON-EXPECTORATED)     Status: None   Collection Time    04/16/14  6:06 PM      Result Value Ref Range Status   Specimen Description TRACHEAL  ASPIRATE   Final   Special Requests NONE   Final   Gram Stain     Final   Value: FEW WBC PRESENT,BOTH PMN AND MONONUCLEAR     RARE SQUAMOUS EPITHELIAL CELLS PRESENT     RARE GRAM POSITIVE COCCI     IN PAIRS     Performed at [facility]   Culture     Final   Value: ABUNDANT STENOTROPHOMONAS MALTOPHILIA     Note: SUSCEPTIBILITIES PERFORMED ON PREVIOUS CULTURE WITHIN THE LAST 5 DAYS.     Performed at [facility]   Report Status 04/21/2014 FINAL   Final  CULTURE, BLOOD (ROUTINE X 2)     Status: None   Collection Time    04/17/14  9:35 AM      Result Value Ref Range Status   Specimen Description BLOOD LEFT ANTECUBITAL   Final   Special Requests BOTTLES DRAWN AEROBIC ONLY 1CC   Final   Culture  Setup Time     Final   Value: 04/17/2014 14:03     Performed at [facility]   Culture     Final   Value: STAPHYLOCOCCUS SPECIES (COAGULASE NEGATIVE)     Note: SUSCEPTIBILITIES PERFORMED ON PREVIOUS CULTURE WITHIN THE LAST 5 DAYS.     Note: Gram Stain Report Called to,Read Back By and Verified With: DARA ANDERSON ON 04/18/2014 AT 6:30P BY Dennard Nip     Performed at [facility]   Report Status 04/20/2014 FINAL   Final  CULTURE, BLOOD (ROUTINE X 2)     Status: None   Collection Time    04/17/14  9:40 AM      Result Value Ref Range Status   Specimen Description BLOOD LEFT HAND   Final   Special Requests BOTTLES DRAWN AEROBIC ONLY 2CC   Final   Culture  Setup Time     Final   Value: 04/17/2014 14:03     Performed at [facility]   Culture     Final   Value: STAPHYLOCOCCUS SPECIES (COAGULASE NEGATIVE)     Note: RIFAMPIN AND GENTAMICIN SHOULD NOT BE  USED AS SINGLE DRUGS FOR TREATMENT OF STAPH INFECTIONS.     Note: Gram Stain Report Called to,Read Back By and Verified With: DARA ANDERSON ON 04/18/2014 AT 6:30P BY WILEJ     Performed at [facility]   Report Status 04/20/2014 FINAL   Final   Organism ID, Bacteria STAPHYLOCOCCUS SPECIES (COAGULASE  NEGATIVE)   Final  CULTURE, RESPIRATORY (NON-EXPECTORATED)     Status: None   Collection Time    04/17/14 11:55 AM      Result Value Ref Range Status   Specimen Description TRACHEAL ASPIRATE   Final   Special Requests Normal   Final   Gram Stain     Final   Value: ABUNDANT WBC PRESENT, PREDOMINANTLY PMN     RARE SQUAMOUS EPITHELIAL CELLS PRESENT     NO ORGANISMS SEEN     Performed at [facility]   Culture     Final   Value: ABUNDANT STENOTROPHOMONAS MALTOPHILIA     Performed at [facility]   Report Status 04/21/2014 FINAL   Final   Organism ID, Bacteria STENOTROPHOMONAS MALTOPHILIA   Final  CULTURE, BLOOD (ROUTINE X 2)     Status: None   Collection Time    04/21/14  3:20 AM      Result Value Ref Range Status   Specimen Description BLOOD LEFT ARM   Final   Special Requests BOTTLES DRAWN AEROBIC ONLY 10CC   Final   Culture  Setup Time     Final   Value: 04/21/2014 11:23     Performed at [facility]   Culture     Final   Value:        BLOOD CULTURE RECEIVED NO GROWTH TO DATE CULTURE WILL BE HELD FOR 5 DAYS BEFORE ISSUING A FINAL NEGATIVE REPORT     Performed at [facility]   Report Status PENDING   Incomplete  CULTURE, BLOOD (ROUTINE X 2)     Status: None   Collection Time    04/21/14  3:25 AM      Result Value Ref Range Status   Specimen Description BLOOD LEFT ARM   Final   Special Requests BOTTLES DRAWN AEROBIC ONLY 10CC   Final   Culture  Setup Time     Final   Value: 04/21/2014 11:23     Performed at [facility]   Culture     Final   Value:        BLOOD CULTURE RECEIVED NO GROWTH TO DATE CULTURE WILL BE HELD FOR 5 DAYS BEFORE ISSUING A FINAL NEGATIVE REPORT     Performed at [facility]   Report Status PENDING   Incomplete    Anti-infectives   Start     Dose/Rate Route Frequency Ordered Stop   04/23/14 1600  vancomycin (VANCOCIN) IVPB 1000 mg/200 mL premix     1,000 mg 200 mL/hr over 60 Minutes Intravenous Every 24  hours 04/22/14 1804     04/19/14 1315  imipenem-cilastatin (PRIMAXIN) 500 mg in sodium chloride 0.9 % 100 mL IVPB  Status:  Discontinued     500 mg 200 mL/hr over 30 Minutes Intravenous 4 times per day 04/19/14 1311 04/23/14 1419   04/13/14 1730  vancomycin (VANCOCIN) IVPB 1000 mg/200 mL premix  Status:  Discontinued     1,000 mg 200 mL/hr over 60 Minutes Intravenous Every 12 hours 04/13/14 0847 04/22/14 1804   04/10/14 1730  vancomycin (VANCOCIN) IVPB 750 mg/150 ml premix  Status:  Discontinued     750 mg 150 mL/hr over 60 Minutes Intravenous Every 12 hours 04/10/14 1112 04/10/14 1116   04/10/14 1730  vancomycin (VANCOCIN) IVPB 750 mg/150 ml premix  Status:  Discontinued     750 mg 150 mL/hr over 60 Minutes Intravenous Every 12 hours 04/10/14 1136 04/13/14 0847   04/08/14 2317  bacitracin 50,000 Units in sodium chloride irrigation 0.9 % 500 mL irrigation  Status:  Discontinued       As needed 04/08/14 2317 04/09/14 0049   04/08/14 1630  vancomycin (VANCOCIN) IVPB 750 mg/150 ml premix  Status:  Discontinued     750 mg 150 mL/hr over 60 Minutes Intravenous Every 12 hours 04/08/14 1538 04/10/14 1111   04/08/14 1000  levofloxacin (LEVAQUIN) tablet 500 mg  Status:  Discontinued     500 mg Oral Daily 04/07/14 1636 04/10/14 1116   04/07/14 1615  levofloxacin (LEVAQUIN) tablet 500 mg  Status:  Discontinued     500 mg Oral Daily 04/07/14 1611 04/07/14 1635      Assessment: 72 YOF on vancomycin day # 18 for coagulase negative staph bacteremia (PCN allergic) in both cultures 5/16 and 5/26. Third set of cultures from 5/30 - ngtd. WBC trended down to 10.8. Afebrile. Pt with acute paraplegia so SCr not reflective of renal function but BUN/SCr have now trended back down. UOP remains good at 1.6 ml/kg/hr.  Vanc to continue thru 05/05/14 (2 weeks from 04/21/14 per ID plan).  5/22 Vanc trough 11.1 -> increase to 1 G Q 12 5/24 Vanc trough 15.1 >> continue 1gm IV q12h 5/31 Vanc trough 33.8 mcg/ml  (supratherapeutic) on 1gm IV q12h > changed to 1 gram IV q24hrs  Imipenem 5/28>>6/1. Stenotrophomonas thought to be colonizer.  Goal of Therapy:  Vancomycin trough level 15-20 mcg/ml  Plan:  Continue Vancomycin 1 gram IV q24hrs. Third dose of this regimen due at 4pm today. Will plan to repeat trough level 6/4 or 6/5.   Arty Baumgartner, [place] Pager: [phone] 04/25/2014,2:31 PM

## 2014-04-25 NOTE — Progress Notes (Signed)
Pt has no bed offers from [facility] currently. CSW provided pt's husband's phone number to Medicaid rep with [facility] in [place], [place]. [facility] has clinically accepted pt, and Medicaid application must be complete before they can take pt. Pt's husband understanding of bed search and placement limitations, and CSW has assured husband that any questions can be answered by Medicaid rep and he can call CSW with any needs.   Ky Barban, MSW, [facility] Clinical Social Worker [phone]

## 2014-04-25 NOTE — Progress Notes (Signed)
PULMONARY / CRITICAL CARE MEDICINE  Name: Joanna Perez MRN: [id number] DOB: 1956-05-27    ADMISSION DATE:  04/07/2014 CONSULTATION DATE:  04/09/2014  REFERRING MD :  Dr. Annette Stable PRIMARY SERVICE: PCCM  CHIEF COMPLAINT:  Paraplegia   BRIEF PATIENT DESCRIPTION: 58 yo with COPD, CHF, DM, CAD s/p MI, atrial fibrillation transferred from [facility] for BLE paraplegia, found to have T6 fracture with spinal stenosis and spinal cord compression on MRI. She was taken for decompressive surgery and remained on mechanical ventilation post surgery.   SIGNIFICANT EVENTS / STUDIES:  5/16  Admitted to [facility], hypotensive and with new paraplegia 5/17  Transfer to [facility] 5/17  MR Cervical/Thoracic/Lumbar Spine - worsened T6 compression fracture with paraspinal and epidural hematoma causing cord compression and edema, possible early cord infarct 5/18  Intubation for procedure 5/18  Thoracic laminectomy 5/18  Extubated 5/19  Somnolent / hypercarbic >>> reintubated 5/26  Tracheostomy 5/28  TTE >>> Ef 65%, vegetation is not reported  LINES / TUBES: ETT 5/18 >>> 5/18; 5/19 >>>5/26 R R AL 5/17 >>> out Foley 5/16 >>> R PICC 5/22 >>> 5/28 Trach 5/26 (df) >>>  CULTURES: 5/16  Blood >>>  COAG NEG STAPH 5/16  Urine >>> neg 5/25 Sputum >  S maltophilia multiresistant 5/26  Sputum >>> STENOTROPHOMONAS MALTOPHILIA 5/26  Blood >>> COAG NEG STAPH 5/30  Blood x3  >>>  ANTIBIOTICS per ID Vancomycin 5/17 >>> Levaquin 5/17 >>> 5/19 Imipenem 5/28 >>>6/1  INTERVAL HISTORY:  Pt reports back pain.  Weaning on 10/5 with good volumes  VITAL SIGNS: Temp:  [97.6 F (36.4 C)-98.3 F (36.8 C)] 98.3 F (36.8 C) (06/03 0752) Pulse Rate:  [49-104] 49 (06/03 0754) Resp:  [19-35] 21 (06/03 0752) BP: (101-134)/(48-98) 134/98 mmHg (06/03 0754) SpO2:  [95 %-99 %] 95 % (06/03 0754) FiO2 (%):  [40 %] 40 % (06/03 0754) Weight:  [91.9 kg (202 lb 9.6 oz)] 91.9 kg (202 lb 9.6 oz) (06/03 0330)  HEMODYNAMICS: CVP:   [7 mmHg-16 mmHg] 7 mmHg  VENTILATOR SETTINGS: Vent Mode:  [-] CPAP;PSV FiO2 (%):  [40 %] 40 % Set Rate:  [20 bmp] 20 bmp PEEP:  [5 cmH20] 5 cmH20 Pressure Support:  [5 cmH20-10 cmH20] 10 cmH20 Plateau Pressure:  [20 cmH20-25 cmH20] 20 cmH20  INTAKE / OUTPUT: Intake/Output     06/02 0701 - 06/03 0700 06/03 0701 - 06/04 0700   I.V. (mL/kg) 1110 (12.1)    NG/GT 1100    IV Piggyback 400    Total Intake(mL/kg) 2610 (28.4)    Urine (mL/kg/hr) 1350 (0.6) 400 (1.3)   Total Output 1350 400   Net +1260 -400        Stool Occurrence 1 x      PHYSICAL EXAMINATION: General:  nad on vent Neuro:  AAOx4, communicates on vent HEENT:  Tracheostomy site intact Cardiovascular:  Regular, no murmurs Lungs:  Bilateral air entry with few scattered rhonchi Abdomen:  Soft, bowel sounds diminished, mild distention, reducible ventral hernia Musculoskeletal:  No edema Skin:  No rash  LABS:  CBC  Recent Labs Lab 04/21/14 0500 04/23/14 0500 04/25/14 0350  WBC 16.8* 10.8* 10.8*  HGB 8.7* 8.4* 8.1*  HCT 28.4* 28.2* 27.1*  PLT 499* 543* 550*   BMET  Recent Labs Lab 04/23/14 0500 04/24/14 0415 04/25/14 0350  NA 151* 146 142  K 3.7 3.8 4.1  CL 102 100 96  CO2 43* 43* 43*  BUN 39* 29* 22  CREATININE 0.39* 0.34* 0.33*  GLUCOSE 117* 134* 143*   Electrolytes  Recent Labs Lab 04/23/14 0500 04/24/14 0415 04/25/14 0350  CALCIUM 9.0 8.6 8.7   ABG  Recent Labs Lab 04/20/14 0945  PHART 7.286*  PCO2ART 76.4*  PO2ART 72.6*   Glucose  Recent Labs Lab 04/24/14 1238 04/24/14 1618 04/24/14 2038 04/24/14 2354 04/25/14 0419 04/25/14 0751  GLUCAP 178* 198* 177* 166* 153* 128*   IMAGING: Dg Chest Port 1 View  04/25/2014   CLINICAL DATA:  Assess tracheostomy in airspace disease  EXAM: PORTABLE CHEST - 1 VIEW  COMPARISON:  04/23/2014  FINDINGS: Unchanged positioning of left IJ central venous catheter, tip near the upper SVC. There is a tracheostomy to which appears well-seated. A  feeding tube crosses the diaphragm.  Unchanged appearance of a right basilar opacity, obscuring the diaphragm. There could also be small opacification of the retrocardiac lung. No effusion or visible pneumothorax. No pulmonary edema.  IMPRESSION: 1. Stable positioning of tubes and central line. 2. Persistent right basilar pneumonia or atelectasis.   Electronically Signed   By: Jorje Guild M.D.   On: 04/25/2014 05:22   Dg Abd Portable 1v  04/23/2014   CLINICAL DATA:  Abdominal pain  EXAM: PORTABLE ABDOMEN - 1 VIEW  COMPARISON:  04/18/2014  FINDINGS: Feeding tube within the mid stomach body. Normal bowel gas pattern. No acute osseous finding or abnormal calcification.  IMPRESSION: Feeding tube within the stomach.   Electronically Signed   By: Daryll Brod M.D.   On: 04/23/2014 12:55   ASSESSMENT / PLAN:  PULMONARY A: Acute on chronic respiratory failure Respiratory muscle weakness secondary to cord compression / new T6 paraplegia  COPD with possible exacerbation Suspect OSA / OHS Tracheostomy status P:    Goal SpO2>92 Will attempt to avoid nocturnal vent in the future, currently PS 10 , goal to 5 , if successful then abg at 5 am when off vent in future DuoNeb / Albuterol / Pulmicort SLP Sutures removed 6/2   CARDIOVASCULAR A:  Chronic diastolic heart failure Dyslipidemia P:  Statin held secondary to elevated transaminases Tele D/C CVP  RENAL A:   Stay balance positive Hypernatremia - improving P:   Trend BMP D5w, consider d/c am 6/3  GASTROINTESTINAL A:   Nutrition GERD New onset generalized abd pain (suspect constipation as small BM's and refusing senna per RN) P:   Protonix as preadmission KUB Encourage senna TF Will likely need PEG - ordered for am   HEMATOLOGIC A:   Anemia VTE Px P:  Trend CBC SCD  INFECTIOUS A:   Bacteremia with COAG NEG STAPH ( in spite of Vancomycin ) R/O Endocarditis - no vegetation noted on ECHO STENOTROPHOMONAS MALTOPHILIA in  sputum, contaminant? Allergy PCN / cephalosporins P:   ID consultation appreciated Meropenem / Vancomycin per ID, note reviewed colinizer, dc imi vanc still required, see duration ID note consider to tygac vs imi  ENDOCRINE  A:   Hyperglycemia Hypothyroidism   P:   SSI Lantus 15 bid Synthroid Increase synthroid, repeat check in 4 wks (~7/2)  NEUROLOGIC A:   T6 cord compression / paraplegia, s/p spinal cord decompression, still no LE movement Delirium  P:   Fentanyl PRN Versed PRN Celexa, Xanax as preadmission Need to discuss with NS regarding upright position  I have fully examined this patient and agree with above findings.    And edited in full  Lavon Paganini. Titus Mould, MD, [place] Pgr: [facility]

## 2014-04-25 NOTE — H&P (Signed)
Joanna Perez is an 58 y.o. female.   Chief Complaint: New paraplegic T6 fracture with cord compression Surgery recently for decompression Remained on mechanical ventilator post op and managed by CCM Malnutrition; dehydration; long term care needs Request for percutaneous gastric tube placement Chart has been reviewed; pt has been seen Scheduled now for G tube in IR 6/4 if BCx neg in am BCx + 5/26 BCx 5/30 neg so far (day 4)  HPI: Trach; COPD; asthma; HTN; HLD; DM; CAD/MI  Past Medical History  Diagnosis Date  . Asthma   . COPD (chronic obstructive pulmonary disease)   . Hypothyroidism   . Essential hypertension, benign   . Hyperlipidemia   . Type 2 diabetes mellitus   . Atrial fibrillation   . MI (myocardial infarction)     2012  . Pericardial effusion 09/09/2013  . CHF (congestive heart failure)   . On home O2     4L N/C continuous  . Thoracic compression fracture 03/23/2014  . Ventral hernia     Past Surgical History  Procedure Laterality Date  . Hernia repair    . Abdominal hysterectomy    . Stomach surgery      Wound vac currently in place  . Esophagogastroduodenoscopy  11/11/2011    Procedure: ESOPHAGOGASTRODUODENOSCOPY (EGD);  Surgeon: Rogene Houston, MD;  Location: [facility];  Service: Endoscopy;  Laterality: N/A;  . Foreign body removal  11/11/2011    Procedure: FOREIGN BODY REMOVAL;  Surgeon: Rogene Houston, MD;  Location: [facility];  Service: Endoscopy;  Laterality: N/A;  . Tracheal surgery    . Abdominal surgery    . Laminectomy N/A 04/08/2014    Procedure: THORACIC LAMINECTOMY ;  Surgeon: Charlie Pitter, MD;  Location: [facility];  Service: Neurosurgery;  Laterality: N/A;  . Tracheostomy  04/17/14    Titus Mould    Family History  Problem Relation Age of Onset  . Diabetes Mother    Social History:  reports that she quit smoking about 4 months ago. Her smoking use included Cigarettes. She has a 52.5 pack-year smoking history. She has never  used smokeless tobacco. She reports that she drinks alcohol. She reports that she does not use illicit drugs.  Allergies:  Allergies  Allergen Reactions  . Amoxicillin Hives  . Bactrim [Sulfamethoxazole-Tmp Ds] Hives  . Erythromycin Hives  . Keflex [Cephalexin] Hives  . Penicillins Itching and Swelling    Sweating    Medications Prior to Admission  Medication Sig Dispense Refill  . albuterol (PROVENTIL HFA;VENTOLIN HFA) 108 (90 BASE) MCG/ACT inhaler Inhale 1 puff into the lungs every 4 (four) hours as needed for wheezing or shortness of breath.      Marland Kitchen albuterol (PROVENTIL) (2.5 MG/3ML) 0.083% nebulizer solution Take 3 mLs (2.5 mg total) by nebulization every 6 (six) hours as needed for wheezing or shortness of breath.  75 mL  12  . ALPRAZolam (XANAX) 1 MG tablet Take 1 mg by mouth 3 (three) times daily.       Marland Kitchen apixaban (ELIQUIS) 5 MG TABS tablet Take 1 tablet (5 mg total) by mouth 2 (two) times daily.  60 tablet  6  . bisacodyl (DULCOLAX) 5 MG EC tablet Take 5 mg by mouth daily as needed for moderate constipation.      . budesonide (PULMICORT) 0.25 MG/2ML nebulizer solution Take 2 mLs (0.25 mg total) by nebulization 2 (two) times daily.  60 mL  12  . cholecalciferol 400 UNITS tablet Take  1 tablet (400 Units total) by mouth daily.  30 each  0  . citalopram (CELEXA) 20 MG tablet Take 20 mg by mouth daily.      Marland Kitchen diltiazem (CARDIZEM CD) 240 MG 24 hr capsule Take 1 capsule (240 mg total) by mouth daily.  30 capsule  6  . fluticasone (FLONASE) 50 MCG/ACT nasal spray Place 1 spray into both nostrils.       . furosemide (LASIX) 20 MG tablet Take 40 mg by mouth daily.      Marland Kitchen gabapentin (NEURONTIN) 100 MG capsule Take 100 mg by mouth 3 (three) times daily.       Marland Kitchen guaiFENesin (MUCINEX) 600 MG 12 hr tablet Take 600 mg by mouth daily.      Marland Kitchen HYDROcodone-acetaminophen (NORCO) 10-325 MG per tablet Take 1 tablet by mouth 3 (three) times daily.      . insulin glargine (LANTUS) 100 UNIT/ML injection  Inject 10 Units into the skin 2 (two) times daily.      Marland Kitchen levofloxacin (LEVAQUIN) 500 MG tablet Take 500 mg by mouth daily.      Marland Kitchen levothyroxine (SYNTHROID, LEVOTHROID) 112 MCG tablet Take 112 mcg by mouth daily before breakfast.      . metFORMIN (GLUCOPHAGE) 500 MG tablet Take 1 tablet (500 mg total) by mouth 2 (two) times daily with a meal.  60 tablet  3  . nystatin (MYCOSTATIN) 100000 UNIT/ML suspension Take 5 mLs (500,000 Units total) by mouth 4 (four) times daily.  60 mL  0  . pantoprazole (PROTONIX) 40 MG tablet Take 40 mg by mouth daily.       . predniSONE (DELTASONE) 10 MG tablet Take 6 tabs daily for 2 days starting 03/29/14 then take 5 tabs daily for 2 days then take 4 tabs daily for 2 das then take 1 tab daily indefinitely.  60 tablet  0  . ramipril (ALTACE) 5 MG capsule Take 1 capsule (5 mg total) by mouth daily.  30 capsule  1  . simvastatin (ZOCOR) 40 MG tablet Take 40 mg by mouth daily at 6 PM.       . sodium chloride (OCEAN) 0.65 % SOLN nasal spray Place 1 spray into both nostrils as needed for congestion (also available OTC).  30 mL  2  . theophylline (THEODUR) 200 MG 12 hr tablet Take 400 mg by mouth 2 (two) times daily.      Marland Kitchen tiotropium (SPIRIVA) 18 MCG inhalation capsule Place 18 mcg into inhaler and inhale daily.        Results for orders placed during the hospital encounter of 04/07/14 (from the past 48 hour(s))  GLUCOSE, CAPILLARY     Status: Abnormal   Collection Time    04/23/14  1:26 PM      Result Value Ref Range   Glucose-Capillary 124 (*) 70 - 99 mg/dL  GLUCOSE, CAPILLARY     Status: Abnormal   Collection Time    04/23/14  3:50 PM      Result Value Ref Range   Glucose-Capillary 128 (*) 70 - 99 mg/dL  GLUCOSE, CAPILLARY     Status: Abnormal   Collection Time    04/23/14  8:24 PM      Result Value Ref Range   Glucose-Capillary 153 (*) 70 - 99 mg/dL  GLUCOSE, CAPILLARY     Status: Abnormal   Collection Time    04/24/14 12:28 AM      Result Value Ref Range    Glucose-Capillary 127 (*)  70 - 99 mg/dL  GLUCOSE, CAPILLARY     Status: Abnormal   Collection Time    04/24/14  4:13 AM      Result Value Ref Range   Glucose-Capillary 120 (*) 70 - 99 mg/dL  BASIC METABOLIC PANEL     Status: Abnormal   Collection Time    04/24/14  4:15 AM      Result Value Ref Range   Sodium 146  137 - 147 mEq/L   Potassium 3.8  3.7 - 5.3 mEq/L   Chloride 100  96 - 112 mEq/L   CO2 43 (*) 19 - 32 mEq/L   Comment: CRITICAL RESULT CALLED TO, READ BACK BY AND VERIFIED WITH:     E GOODEN,RN 04/24/14 0458 RHOLMES   Glucose, Bld 134 (*) 70 - 99 mg/dL   BUN 29 (*) 6 - 23 mg/dL   Creatinine, Ser 0.34 (*) 0.50 - 1.10 mg/dL   Calcium 8.6  8.4 - 10.5 mg/dL   GFR calc non Af Amer >90  >90 mL/min   GFR calc Af Amer >90  >90 mL/min   Comment: (NOTE)     The eGFR has been calculated using the CKD EPI equation.     This calculation has not been validated in all clinical situations.     eGFR's persistently <90 mL/min signify possible Chronic Kidney     Disease.  GLUCOSE, CAPILLARY     Status: Abnormal   Collection Time    04/24/14  7:42 AM      Result Value Ref Range   Glucose-Capillary 149 (*) 70 - 99 mg/dL  GLUCOSE, CAPILLARY     Status: Abnormal   Collection Time    04/24/14 12:38 PM      Result Value Ref Range   Glucose-Capillary 178 (*) 70 - 99 mg/dL  GLUCOSE, CAPILLARY     Status: Abnormal   Collection Time    04/24/14  4:18 PM      Result Value Ref Range   Glucose-Capillary 198 (*) 70 - 99 mg/dL   Comment 1 Notify RN    GLUCOSE, CAPILLARY     Status: Abnormal   Collection Time    04/24/14  8:38 PM      Result Value Ref Range   Glucose-Capillary 177 (*) 70 - 99 mg/dL  GLUCOSE, CAPILLARY     Status: Abnormal   Collection Time    04/24/14 11:54 PM      Result Value Ref Range   Glucose-Capillary 166 (*) 70 - 99 mg/dL  BASIC METABOLIC PANEL     Status: Abnormal   Collection Time    04/25/14  3:50 AM      Result Value Ref Range   Sodium 142  137 - 147 mEq/L    Potassium 4.1  3.7 - 5.3 mEq/L   Chloride 96  96 - 112 mEq/L   CO2 43 (*) 19 - 32 mEq/L   Comment: CRITICAL RESULT CALLED TO, READ BACK BY AND VERIFIED WITH:     EVANGELISTA M,RN 04/25/14 0438 WAYK   Glucose, Bld 143 (*) 70 - 99 mg/dL   BUN 22  6 - 23 mg/dL   Creatinine, Ser 0.33 (*) 0.50 - 1.10 mg/dL   Calcium 8.7  8.4 - 10.5 mg/dL   GFR calc non Af Amer >90  >90 mL/min   GFR calc Af Amer >90  >90 mL/min   Comment: (NOTE)     The eGFR has been calculated using the CKD EPI equation.  This calculation has not been validated in all clinical situations.     eGFR's persistently <90 mL/min signify possible Chronic Kidney     Disease.  CBC     Status: Abnormal   Collection Time    04/25/14  3:50 AM      Result Value Ref Range   WBC 10.8 (*) 4.0 - 10.5 K/uL   RBC 3.07 (*) 3.87 - 5.11 MIL/uL   Hemoglobin 8.1 (*) 12.0 - 15.0 g/dL   HCT 27.1 (*) 36.0 - 46.0 %   MCV 88.3  78.0 - 100.0 fL   MCH 26.4  26.0 - 34.0 pg   MCHC 29.9 (*) 30.0 - 36.0 g/dL   RDW 20.5 (*) 11.5 - 15.5 %   Platelets 550 (*) 150 - 400 K/uL  GLUCOSE, CAPILLARY     Status: Abnormal   Collection Time    04/25/14  4:19 AM      Result Value Ref Range   Glucose-Capillary 153 (*) 70 - 99 mg/dL  GLUCOSE, CAPILLARY     Status: Abnormal   Collection Time    04/25/14  7:51 AM      Result Value Ref Range   Glucose-Capillary 128 (*) 70 - 99 mg/dL   Dg Chest Port 1 View  04/25/2014   CLINICAL DATA:  Assess tracheostomy in airspace disease  EXAM: PORTABLE CHEST - 1 VIEW  COMPARISON:  04/23/2014  FINDINGS: Unchanged positioning of left IJ central venous catheter, tip near the upper SVC. There is a tracheostomy to which appears well-seated. A feeding tube crosses the diaphragm.  Unchanged appearance of a right basilar opacity, obscuring the diaphragm. There could also be small opacification of the retrocardiac lung. No effusion or visible pneumothorax. No pulmonary edema.  IMPRESSION: 1. Stable positioning of tubes and central  line. 2. Persistent right basilar pneumonia or atelectasis.   Electronically Signed   By: Jorje Guild M.D.   On: 04/25/2014 05:22   Dg Abd Portable 1v  04/23/2014   CLINICAL DATA:  Abdominal pain  EXAM: PORTABLE ABDOMEN - 1 VIEW  COMPARISON:  04/18/2014  FINDINGS: Feeding tube within the mid stomach body. Normal bowel gas pattern. No acute osseous finding or abnormal calcification.  IMPRESSION: Feeding tube within the stomach.   Electronically Signed   By: Daryll Brod M.D.   On: 04/23/2014 12:55    Review of Systems  Constitutional: Negative for fever.  Respiratory:       Trach  Neurological: Positive for weakness.    Blood pressure 134/98, pulse 49, temperature 98.3 F (36.8 C), temperature source Oral, resp. rate 21, height _0  (1.575 m), weight 91.9 kg (202 lb 9.6 oz), SpO2 95.00%. Physical Exam  Constitutional: She is oriented to person, place, and time.  On trach; communicative over trach  Cardiovascular: Normal rate and regular rhythm.   No murmur heard. Respiratory: Breath sounds normal. She is in respiratory distress.  GI: Soft. Bowel sounds are normal.  Musculoskeletal: Normal range of motion.  Neurological: She is alert and oriented to person, place, and time.  Pt oriented to name; dob; place; date Also consented husband over phone  Skin: Skin is warm and dry.  Psychiatric: She has a normal mood and affect. Her behavior is normal. Judgment and thought content normal.     Assessment/Plan New paraplegic T6 fx/cord compression--OR Now with malnutrition and long term care needs Scheduled for Perc G tube in IR per CCM request Pt and husband aware of procedure benefits and risks  and agreeable to proceed Check kub; on Vanco Consent signed and in chart  Lavonia Drafts 04/25/2014, 11:17 AM

## 2014-04-26 ENCOUNTER — Inpatient Hospital Stay (HOSPITAL_COMMUNITY): Payer: Medicaid Other

## 2014-04-26 LAB — CBC WITH DIFFERENTIAL/PLATELET
BASOS ABS: 0 10*3/uL (ref 0.0–0.1)
BASOS PCT: 0 % (ref 0–1)
Eosinophils Absolute: 0.1 10*3/uL (ref 0.0–0.7)
Eosinophils Relative: 1 % (ref 0–5)
HEMATOCRIT: 28.1 % — AB (ref 36.0–46.0)
Hemoglobin: 8.3 g/dL — ABNORMAL LOW (ref 12.0–15.0)
Lymphocytes Relative: 14 % (ref 12–46)
Lymphs Abs: 1.6 10*3/uL (ref 0.7–4.0)
MCH: 25.9 pg — ABNORMAL LOW (ref 26.0–34.0)
MCHC: 29.5 g/dL — AB (ref 30.0–36.0)
MCV: 87.8 fL (ref 78.0–100.0)
MONO ABS: 0.5 10*3/uL (ref 0.1–1.0)
Monocytes Relative: 4 % (ref 3–12)
NEUTROS ABS: 8.8 10*3/uL — AB (ref 1.7–7.7)
NEUTROS PCT: 81 % — AB (ref 43–77)
PLATELETS: 481 10*3/uL — AB (ref 150–400)
RBC: 3.2 MIL/uL — ABNORMAL LOW (ref 3.87–5.11)
RDW: 20.2 % — ABNORMAL HIGH (ref 11.5–15.5)
WBC: 10.9 10*3/uL — AB (ref 4.0–10.5)

## 2014-04-26 LAB — GLUCOSE, CAPILLARY
Glucose-Capillary: 74 mg/dL (ref 70–99)
Glucose-Capillary: 74 mg/dL (ref 70–99)
Glucose-Capillary: 76 mg/dL (ref 70–99)
Glucose-Capillary: 80 mg/dL (ref 70–99)
Glucose-Capillary: 123 mg/dL — ABNORMAL HIGH (ref 70–99)

## 2014-04-26 LAB — VANCOMYCIN, TROUGH: VANCOMYCIN TR: 12.5 ug/mL (ref 10.0–20.0)

## 2014-04-26 MED ORDER — MIDAZOLAM HCL 2 MG/2ML IJ SOLN
INTRAMUSCULAR | Status: AC
  Filled 2014-04-26: qty 4

## 2014-04-26 MED ORDER — FENTANYL CITRATE 0.05 MG/ML IJ SOLN
INTRAMUSCULAR | Status: AC | PRN
  Administered 2014-04-26: 50 ug via INTRAVENOUS

## 2014-04-26 MED ORDER — VANCOMYCIN HCL IN DEXTROSE 1-5 GM/200ML-% IV SOLN
1000.0000 mg | INTRAVENOUS | Status: DC
  Administered 2014-04-26 – 2014-04-29 (×5): 1000 mg via INTRAVENOUS
  Filled 2014-04-26 (×5): qty 200

## 2014-04-26 MED ORDER — FENTANYL CITRATE 0.05 MG/ML IJ SOLN
INTRAMUSCULAR | Status: AC
  Filled 2014-04-26: qty 4

## 2014-04-26 MED ORDER — MIDAZOLAM HCL 2 MG/2ML IJ SOLN
INTRAMUSCULAR | Status: AC | PRN
  Administered 2014-04-26: 2 mg via INTRAVENOUS

## 2014-04-26 MED ORDER — HYDROMORPHONE HCL PF 1 MG/ML IJ SOLN
1.0000 mg | INTRAMUSCULAR | Status: DC | PRN
  Administered 2014-04-28 – 2014-05-08 (×27): 1 mg via INTRAVENOUS
  Filled 2014-04-26 (×29): qty 1

## 2014-04-26 MED ORDER — GLUCAGON HCL RDNA (DIAGNOSTIC) 1 MG IJ SOLR
INTRAMUSCULAR | Status: AC
  Administered 2014-04-26: 1 mg
  Filled 2014-04-26: qty 1

## 2014-04-26 MED ORDER — DEXTROSE 5 % IV SOLN
INTRAVENOUS | Status: DC
  Administered 2014-04-26 – 2014-05-01 (×4): via INTRAVENOUS

## 2014-04-26 MED ORDER — IOHEXOL 300 MG/ML  SOLN
50.0000 mL | Freq: Once | INTRAMUSCULAR | Status: AC | PRN
  Administered 2014-04-26: 15 mL via INTRAVENOUS

## 2014-04-26 MED ORDER — HYDROCODONE-ACETAMINOPHEN 5-325 MG PO TABS
1.0000 | ORAL_TABLET | ORAL | Status: DC | PRN
  Administered 2014-04-27: 1 via ORAL
  Administered 2014-04-28: 2 via ORAL
  Administered 2014-04-28: 1 via ORAL
  Administered 2014-04-28: 2 via ORAL
  Administered 2014-04-29 – 2014-04-30 (×4): 1 via ORAL
  Administered 2014-05-01 – 2014-05-04 (×6): 2 via ORAL
  Administered 2014-05-04: 1 via ORAL
  Administered 2014-05-04 (×2): 2 via ORAL
  Administered 2014-05-05 – 2014-05-06 (×3): 1 via ORAL
  Administered 2014-05-06 – 2014-05-07 (×2): 2 via ORAL
  Administered 2014-05-07: 1 via ORAL
  Administered 2014-05-07: 2 via ORAL
  Filled 2014-04-26 (×3): qty 2
  Filled 2014-04-26 (×3): qty 1
  Filled 2014-04-26 (×6): qty 2
  Filled 2014-04-26: qty 1
  Filled 2014-04-26 (×2): qty 2
  Filled 2014-04-26: qty 1
  Filled 2014-04-26: qty 2
  Filled 2014-04-26: qty 1
  Filled 2014-04-26 (×2): qty 2
  Filled 2014-04-26 (×2): qty 1

## 2014-04-26 NOTE — Progress Notes (Signed)
No significant change in status. Continue current management. No new recommendations.

## 2014-04-26 NOTE — Procedures (Signed)
Interventional Radiology Procedure Note  Procedure: Placement of percutaneous 57F pull-through gastrostomy tube. Complications: None Recommendations: - NPO except for sips and chips remainder of today and overnight - Maintain G-tube to LWS until tomorrow morning  - May advance diet as tolerated and begin using tube tomorrow at 17:00  Signed,  Criselda Peaches, MD [facility] Rad[facility]

## 2014-04-26 NOTE — Progress Notes (Signed)
Patient is currently NPO for PEG placement. CBG's consistently in 70's for past 3 readings. Georgann Housekeeper NP on unit and made aware. Orders received.

## 2014-04-26 NOTE — Progress Notes (Signed)
ANTIBIOTIC CONSULT NOTE - FOLLOW UP  Pharmacy Consult for vancomycin Indication: CNS bacteremia with PCN allergy  Allergies  Allergen Reactions  . Amoxicillin Hives  . Bactrim [Sulfamethoxazole-Tmp Ds] Hives  . Erythromycin Hives  . Keflex [Cephalexin] Hives  . Penicillins Itching and Swelling    Sweating    Patient Measurements: Height: 5\' 2"  (157.5 cm) Weight: 200 lb 9.9 oz (91 kg) IBW/kg (Calculated) : 50.1 Vital Signs: Temp: 98.2 F (36.8 C) (06/04 1546) Temp src: Oral (06/04 1546) BP: 122/73 mmHg (06/04 1546) Pulse Rate: 86 (06/04 1606) Intake/Output from previous day: 06/03 0701 - 06/04 0700 In: 1260 [I.V.:210; NG/GT:850; IV Piggyback:200] Out: 3000 [Urine:3000] Intake/Output from this shift: Total I/O In: -  Out: 1200 [Urine:1200]  Labs:  Recent Labs  04/24/14 0415 04/25/14 0350 04/26/14 0300  WBC  --  10.8* 10.9*  HGB  --  8.1* 8.3*  PLT  --  550* 481*  CREATININE 0.34* 0.33*  --    Estimated Creatinine Clearance: 81.5 ml/min (by C-G formula based on Cr of 0.33).  Recent Labs  04/26/14 1558  [place] 12.5     Microbiology: Recent Results (from the past 720 hour(s))  URINE CULTURE     Status: None   Collection Time    04/07/14 11:50 AM      Result Value Ref Range Status   Specimen Description URINE, CATHETERIZED   Final   Special Requests NONE   Final   Culture  Setup Time     Final   Value: 04/08/2014 01:35     Performed at [facility]     Final   Value: NO GROWTH     Performed at [facility]   Culture     Final   Value: NO GROWTH     Performed at [facility]   Report Status 04/09/2014 FINAL   Final  CULTURE, BLOOD (ROUTINE X 2)     Status: None   Collection Time    04/07/14 12:02 PM      Result Value Ref Range Status   Specimen Description BLOOD RIGHT ANTECUBITAL   Final   Special Requests BOTTLES DRAWN AEROBIC ONLY [facility]   Final   Culture  Setup Time     Final   Value: 04/08/2014 21:13      Performed at [facility]   Culture     Final   Value: STAPHYLOCOCCUS SPECIES (COAGULASE NEGATIVE)     Note: RIFAMPIN AND GENTAMICIN SHOULD NOT BE USED AS SINGLE DRUGS FOR TREATMENT OF STAPH INFECTIONS.     Note: Performed at [facility]     Performed at [facility]   Report Status 04/10/2014 FINAL   Final   Organism ID, Bacteria STAPHYLOCOCCUS SPECIES (COAGULASE NEGATIVE)   Final  CULTURE, BLOOD (ROUTINE X 2)     Status: None   Collection Time    04/07/14 12:02 PM      Result Value Ref Range Status   Specimen Description BLOOD RIGHT HAND   Final   Special Requests BOTTLES DRAWN AEROBIC AND ANAEROBIC 8CC   Final   Culture  Setup Time     Final   Value: 04/08/2014 21:16     Performed at [facility]   Culture     Final   Value: STAPHYLOCOCCUS SPECIES (COAGULASE NEGATIVE)     Note: SUSCEPTIBILITIES PERFORMED ON PREVIOUS CULTURE WITHIN THE LAST 5 DAYS.     Note: Performed at [facility]  [facility] Gram Stain Report Called to,Read Back By and Verified With: STONE A 04/08/14 0811 BY THOMPSON S     Performed at [facility]   Report Status 04/10/2014 FINAL   Final  SURGICAL PCR SCREEN     Status: None   Collection Time    04/08/14  7:54 PM      Result Value Ref Range Status   MRSA, PCR NEGATIVE  NEGATIVE Final   Staphylococcus aureus NEGATIVE  NEGATIVE Final   Comment:            The Xpert SA Assay (FDA     approved for NASAL specimens     in patients over 55 years of age),     is one component of     a comprehensive surveillance     program.  Test performance has     been validated by [employer] for patients greater     than or equal to 6 year old.     It is not intended     to diagnose infection nor to     guide or monitor treatment.  CULTURE, RESPIRATORY (NON-EXPECTORATED)     Status: None   Collection Time    04/16/14  6:06 PM      Result Value Ref Range Status   Specimen Description TRACHEAL ASPIRATE   Final   Special  Requests NONE   Final   Gram Stain     Final   Value: FEW WBC PRESENT,BOTH PMN AND MONONUCLEAR     RARE SQUAMOUS EPITHELIAL CELLS PRESENT     RARE GRAM POSITIVE COCCI     IN PAIRS     Performed at [facility]   Culture     Final   Value: ABUNDANT STENOTROPHOMONAS MALTOPHILIA     Note: SUSCEPTIBILITIES PERFORMED ON PREVIOUS CULTURE WITHIN THE LAST 5 DAYS.     Performed at [facility]   Report Status 04/21/2014 FINAL   Final  CULTURE, BLOOD (ROUTINE X 2)     Status: None   Collection Time    04/17/14  9:35 AM      Result Value Ref Range Status   Specimen Description BLOOD LEFT ANTECUBITAL   Final   Special Requests BOTTLES DRAWN AEROBIC ONLY 1CC   Final   Culture  Setup Time     Final   Value: 04/17/2014 14:03     Performed at [facility]   Culture     Final   Value: STAPHYLOCOCCUS SPECIES (COAGULASE NEGATIVE)     Note: SUSCEPTIBILITIES PERFORMED ON PREVIOUS CULTURE WITHIN THE LAST 5 DAYS.     Note: Gram Stain Report Called to,Read Back By and Verified With: DARA ANDERSON ON 04/18/2014 AT 6:30P BY Dennard Nip     Performed at [facility]   Report Status 04/20/2014 FINAL   Final  CULTURE, BLOOD (ROUTINE X 2)     Status: None   Collection Time    04/17/14  9:40 AM      Result Value Ref Range Status   Specimen Description BLOOD LEFT HAND   Final   Special Requests BOTTLES DRAWN AEROBIC ONLY 2CC   Final   Culture  Setup Time     Final   Value: 04/17/2014 14:03     Performed at [facility]   Culture     Final   Value: STAPHYLOCOCCUS SPECIES (COAGULASE NEGATIVE)     Note: RIFAMPIN AND GENTAMICIN SHOULD NOT  BE USED AS SINGLE DRUGS FOR TREATMENT OF STAPH INFECTIONS.     Note: Gram Stain Report Called to,Read Back By and Verified With: DARA ANDERSON ON 04/18/2014 AT 6:30P BY WILEJ     Performed at [facility]   Report Status 04/20/2014 FINAL   Final   Organism ID, Bacteria STAPHYLOCOCCUS SPECIES (COAGULASE NEGATIVE)   Final  CULTURE,  RESPIRATORY (NON-EXPECTORATED)     Status: None   Collection Time    04/17/14 11:55 AM      Result Value Ref Range Status   Specimen Description TRACHEAL ASPIRATE   Final   Special Requests Normal   Final   Gram Stain     Final   Value: ABUNDANT WBC PRESENT, PREDOMINANTLY PMN     RARE SQUAMOUS EPITHELIAL CELLS PRESENT     NO ORGANISMS SEEN     Performed at [facility]   Culture     Final   Value: ABUNDANT STENOTROPHOMONAS MALTOPHILIA     Performed at [facility]   Report Status 04/21/2014 FINAL   Final   Organism ID, Bacteria STENOTROPHOMONAS MALTOPHILIA   Final  CULTURE, BLOOD (ROUTINE X 2)     Status: None   Collection Time    04/21/14  3:20 AM      Result Value Ref Range Status   Specimen Description BLOOD LEFT ARM   Final   Special Requests BOTTLES DRAWN AEROBIC ONLY 10CC   Final   Culture  Setup Time     Final   Value: 04/21/2014 11:23     Performed at [facility]   Culture     Final   Value:        BLOOD CULTURE RECEIVED NO GROWTH TO DATE CULTURE WILL BE HELD FOR 5 DAYS BEFORE ISSUING A FINAL NEGATIVE REPORT     Performed at [facility]   Report Status PENDING   Incomplete  CULTURE, BLOOD (ROUTINE X 2)     Status: None   Collection Time    04/21/14  3:25 AM      Result Value Ref Range Status   Specimen Description BLOOD LEFT ARM   Final   Special Requests BOTTLES DRAWN AEROBIC ONLY 10CC   Final   Culture  Setup Time     Final   Value: 04/21/2014 11:23     Performed at [facility]   Culture     Final   Value:        BLOOD CULTURE RECEIVED NO GROWTH TO DATE CULTURE WILL BE HELD FOR 5 DAYS BEFORE ISSUING A FINAL NEGATIVE REPORT     Performed at [facility]   Report Status PENDING   Incomplete    Anti-infectives   Start     Dose/Rate Route Frequency Ordered Stop   04/23/14 1600  vancomycin (VANCOCIN) IVPB 1000 mg/200 mL premix     1,000 mg 200 mL/hr over 60 Minutes Intravenous Every 24 hours 04/22/14 1804      04/19/14 1315  imipenem-cilastatin (PRIMAXIN) 500 mg in sodium chloride 0.9 % 100 mL IVPB  Status:  Discontinued     500 mg 200 mL/hr over 30 Minutes Intravenous 4 times per day 04/19/14 1311 04/23/14 1419   04/13/14 1730  vancomycin (VANCOCIN) IVPB 1000 mg/200 mL premix  Status:  Discontinued     1,000 mg 200 mL/hr over 60 Minutes Intravenous Every 12 hours 04/13/14 0847 04/22/14 1804   04/10/14 1730  vancomycin (VANCOCIN) IVPB 750 mg/150 ml  premix  Status:  Discontinued     750 mg 150 mL/hr over 60 Minutes Intravenous Every 12 hours 04/10/14 1112 04/10/14 1116   04/10/14 1730  vancomycin (VANCOCIN) IVPB 750 mg/150 ml premix  Status:  Discontinued     750 mg 150 mL/hr over 60 Minutes Intravenous Every 12 hours 04/10/14 1136 04/13/14 0847   04/08/14 2317  bacitracin 50,000 Units in sodium chloride irrigation 0.9 % 500 mL irrigation  Status:  Discontinued       As needed 04/08/14 2317 04/09/14 0049   04/08/14 1630  vancomycin (VANCOCIN) IVPB 750 mg/150 ml premix  Status:  Discontinued     750 mg 150 mL/hr over 60 Minutes Intravenous Every 12 hours 04/08/14 1538 04/10/14 1111   04/08/14 1000  levofloxacin (LEVAQUIN) tablet 500 mg  Status:  Discontinued     500 mg Oral Daily 04/07/14 1636 04/10/14 1116   04/07/14 1615  levofloxacin (LEVAQUIN) tablet 500 mg  Status:  Discontinued     500 mg Oral Daily 04/07/14 1611 04/07/14 1635      Assessment:  58 yo F presenting on 5/16 with AMS, weakness. Pharmacy consulted to dose vancomycin.    PMH: asthma, COPD, hypothyroid, HTN, HLD, DM, afib, dCHF, BLE paraplegia  AC: Apixaban PTA for Afib. CHADSvasc = 4. Called CCM- to follow up with neurosurgery as to when to restart anticoagulation.   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 Dc imi 6/1ID wants 2 wks Vanc - counting 5/30 as #1, so #5/14, to stop on 6/13. Paraplegia so SCr not reflective of renal function  afb, WBC 16.8>>10.9.  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 Follow up SCr, UOP, cultures, clinical course and adjust as clinically indicated. Follow up plan to resume home apixaban   Thank you for allowing pharmacy to be a part of this patients care team.  Rowe Robert Pharm.D., BCPS, AQ-Cardiology Clinical Pharmacist 04/26/2014 4:50 PM Pager: [phone] Phone: [phone]

## 2014-04-26 NOTE — Progress Notes (Signed)
Transported to IR on FiO2 100%, and returned to unit, and placed on normal vent settings.  No complications noted.  Will continue to monitor.

## 2014-04-26 NOTE — Progress Notes (Signed)
Trach consult done at this time.  Pt remains on vent.  Per CSW notes plan is for pt to discharge to vent SNF.  Will continue to follow.

## 2014-04-26 NOTE — Sedation Documentation (Signed)
Pt arrived on vent with RT and trach

## 2014-04-26 NOTE — Progress Notes (Signed)
PULMONARY / CRITICAL CARE MEDICINE  Name: Joanna Perez MRN: [id number] DOB: [birth year]    ADMISSION DATE:  04/07/2014 CONSULTATION DATE:  04/09/2014  REFERRING MD :  Dr. Annette Stable PRIMARY SERVICE: PCCM  CHIEF COMPLAINT:  Paraplegia   BRIEF PATIENT DESCRIPTION: 58 yo with COPD, CHF, DM, CAD s/p MI, atrial fibrillation transferred from [facility] for BLE paraplegia, found to have T6 fracture with spinal stenosis and spinal cord compression on MRI. She was taken for decompressive surgery and remained on mechanical ventilation post surgery.   SIGNIFICANT EVENTS / STUDIES:  5/16  Admitted to [facility], hypotensive and with new paraplegia 5/17  Transfer to [facility] 5/17  MR Cervical/Thoracic/Lumbar Spine - worsened T6 compression fracture with paraspinal and epidural hematoma causing cord compression and edema, possible early cord infarct 5/18  Intubation for procedure 5/18  Thoracic laminectomy 5/18  Extubated 5/19  Somnolent / hypercarbic >>> reintubated 5/26  Tracheostomy 5/28  TTE >>> Ef 65%, vegetation is not reported 6/4 G-tube placement  LINES / TUBES: ETT 5/18 >>> 5/18; 5/19 >>>5/26 R R AL 5/17 >>> out Foley 5/16 >>> R PICC 5/22 >>> 5/28 Trach 5/26 (df) >>> PEG 6/4 >>>  CULTURES: 5/16  Blood >>>  COAG NEG STAPH 5/16  Urine >>> neg 5/25 Sputum >  S maltophilia multiresistant 5/26  Sputum >>> STENOTROPHOMONAS MALTOPHILIA 5/26  Blood >>> COAG NEG STAPH 5/30  Blood x3  >>>  ANTIBIOTICS per ID Vancomycin 5/17 >>> Levaquin 5/17 >>> 5/19 Imipenem 5/28 >>>6/1  INTERVAL HISTORY:  Alert/oriented, frustrated with her inability to communicate  VITAL SIGNS: Temp:  [98 F (36.7 C)-99.3 F (37.4 C)] 98.5 F (36.9 C) (06/04 1217) Pulse Rate:  [78-89] 85 (06/04 1223) Resp:  [19-30] 30 (06/04 1223) BP: (98-134)/(50-86) 114/57 mmHg (06/04 1223) SpO2:  [90 %-97 %] 90 % (06/04 1223) FiO2 (%):  [35 %-40 %] 35 % (06/04 1223) Weight:  [91 kg (200 lb 9.9 oz)] 91 kg (200 lb 9.9 oz)  (06/04 0402)  HEMODYNAMICS: CVP:  [8 mmHg-11 mmHg] 8 mmHg  VENTILATOR SETTINGS: Vent Mode:  [-] CPAP;PSV FiO2 (%):  [35 %-40 %] 35 % Set Rate:  [20 bmp] 20 bmp Vt Set:  [400 mL] 400 mL PEEP:  [5 cmH20] 5 cmH20 Pressure Support:  [10 cmH20] 10 cmH20 Plateau Pressure:  [27 cmH20] 27 cmH20  INTAKE / OUTPUT: Intake/Output     06/03 0701 - 06/04 0700 06/04 0701 - 06/05 0700   I.V. (mL/kg) 210 (2.3)    NG/GT 850    IV Piggyback 200    Total Intake(mL/kg) 1260 (13.8)    Urine (mL/kg/hr) 3000 (1.4) 1200 (2)   Total Output 3000 1200   Net -1740 -1200          PHYSICAL EXAMINATION: General:  nad on vent Neuro:  AAOx4, communicates on vent HEENT:  Tracheostomy site intact clean, dry Cardiovascular:  s1 s2 RRR Lungs:  Reduced bases Abdomen:  Soft, bowel sounds wnl, obese, reducible ventral hernia Musculoskeletal:  No edema Skin:  No rash  LABS:  CBC  Recent Labs Lab 04/23/14 0500 04/25/14 0350 04/26/14 0300  WBC 10.8* 10.8* 10.9*  HGB 8.4* 8.1* 8.3*  HCT 28.2* 27.1* 28.1*  PLT 543* 550* 481*   BMET  Recent Labs Lab 04/23/14 0500 04/24/14 0415 04/25/14 0350  NA 151* 146 142  K 3.7 3.8 4.1  CL 102 100 96  CO2 43* 43* 43*  BUN 39* 29* 22  CREATININE 0.39* 0.34* 0.33*  GLUCOSE  117* 134* 143*   Electrolytes  Recent Labs Lab 04/23/14 0500 04/24/14 0415 04/25/14 0350  CALCIUM 9.0 8.6 8.7   ABG  Recent Labs Lab 04/20/14 0945  PHART 7.286*  PCO2ART 76.4*  PO2ART 72.6*   Glucose  Recent Labs Lab 04/25/14 1530 04/25/14 1932 04/25/14 2320 04/26/14 0442 04/26/14 0845 04/26/14 1216  GLUCAP 174* 148* 122* 74 74 59   IMAGING: Dg Chest Port 1 View  04/25/2014   CLINICAL DATA:  Assess tracheostomy in airspace disease  EXAM: PORTABLE CHEST - 1 VIEW  COMPARISON:  04/23/2014  FINDINGS: Unchanged positioning of left IJ central venous catheter, tip near the upper SVC. There is a tracheostomy to which appears well-seated. A feeding tube crosses the  diaphragm.  Unchanged appearance of a right basilar opacity, obscuring the diaphragm. There could also be small opacification of the retrocardiac lung. No effusion or visible pneumothorax. No pulmonary edema.  IMPRESSION: 1. Stable positioning of tubes and central line. 2. Persistent right basilar pneumonia or atelectasis.   Electronically Signed   By: Jorje Guild M.D.   On: 04/25/2014 05:22   Dg Abd Portable 1v  04/26/2014   CLINICAL DATA:  Evaluate barium for gastrostomy tube.  EXAM: PORTABLE ABDOMEN - 1 VIEW  COMPARISON:  04/23/2014  FINDINGS: A feeding tube terminates in the region of the mid stomach. Barium opacifies the colon to the level of the distal transverse segment. There is extensive motion artifact, limiting further assessment of the abdomen. No gross basilar lung opacity.  IMPRESSION: Barium opacifies the colon to the level of the distal transverse segment.   Electronically Signed   By: Jorje Guild M.D.   On: 04/26/2014 06:32   ASSESSMENT / PLAN:  PULMONARY A: Acute on chronic respiratory failure Respiratory muscle weakness secondary to cord compression / new T6 paraplegia  COPD with possible exacerbation Suspect OSA / OHS Tracheostomy status P:    Goal SpO2>92 Weaning ps 10, appears better today, try ps 5 if tolerates. Not ready for ATC trials.  Will call PT, ned to have her in chair ? Would improve weaning Neg balance today with lasix DuoNeb / Albuterol / Pulmicort SLP   CARDIOVASCULAR A:  Chronic diastolic heart failure Dyslipidemia P:  F/u LFT 6/5 Tele Lasix to dc  RENAL A:   Stay balance positive Hypernatremia - improving P:   F/u BMP 6/5 D5w, consider d/c today D/c Lasix > 2L negative since 6/3, -5L admission.  GASTROINTESTINAL A:   Nutrition GERD New onset generalized abd pain (suspect constipation as small BM's and refusing senna per RN) dysphagia P:   Protonix as preadmission senna TF PEG planned per IR< need NPO status when time  known  HEMATOLOGIC A:   Anemia VTE Px P:  Trend CBC with laisx, may concentrate SCD  INFECTIOUS A:   Bacteremia with COAG NEG STAPH ( in spite of Vancomycin ) R/O Endocarditis - no vegetation noted on ECHO 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, contaminant? Allergy PCN / cephalosporins P:  vanc still required, see duration ID note  ENDOCRINE  A:   Hyperglycemia, now normal on lantus, low 128 Hypothyroidism   P:   SSI Lantus 15 bid, dc as npo and has been low Synthroid Increase synthroid, repeat check in 4 wks (~7/2)  NEUROLOGIC A:   T6 cord compression / paraplegia, s/p spinal cord decompression, still no LE movement Delirium resolved  P:   Fentanyl PRN Versed PRN Celexa, Xanax as preadmission will mobilize, to chair as goal Continued work  with PT  Global: Husband to visit SNF early next week. D/c likely ~ 6/10-12  Georgann Housekeeper, ACNP [facility] [facility] Pager [phone] or [phone]  I have fully examined this patient and agree with above findings.    And edited in full   Lavon Paganini. Titus Mould, MD, [place] Pgr: [facility]

## 2014-04-26 NOTE — Evaluation (Signed)
Physical Therapy Evaluation Patient Details Name: Joanna Perez MRN: [id number] DOB: Aug 31, 1956 Today's Date: 04/26/2014   History of Present Illness  58 yo  transferred from [facility] (adm s/p fall) for BLE paraplegia, found to have T6 fracture with spinal stenosis and spinal cord compression on MRI. She was taken for decompressive surgery (T5-8) and remained on mechanical ventilation post surgery. Pt now s/p trach and has been unable to wean off vent. PMHx- with COPD, CHF, DM, CAD s/p MI, atrial fibrillation; Pt on home 4L 02.  Clinical Impression  Patient is s/p T5-T8 decompressive surgery due to T6 fracture and paraplegia. Pt has functional limitations due to the deficits listed below (see PT Problem List). Pt very motivated and despite anxiety worked throughout entire session. Patient will benefit from skilled PT to increase their independence and safety with mobility to allow discharge to the venue listed below.       Follow Up Recommendations SNF;Supervision/Assistance - 24 hour    Equipment Recommendations  None recommended by PT (TBA at SNF)    Recommendations for Other Services       Precautions / Restrictions Precautions Precautions: Fall      Mobility  Bed Mobility Overal bed mobility: +2 for physical assistance;Needs Assistance Bed Mobility: Supine to Sit     Supine to sit: Max assist;+2 for physical assistance;+2 for safety/equipment;HOB elevated     General bed mobility comments: HOB incr in 10 degree increments from 25 to 45 degrees with BP stable and no orthostasis; pt assisted to pivot to sit EOB from [facility] elevated. Pt using head and arms appropriately to assist with movement  Transfers                    Ambulation/Gait                Stairs            Wheelchair Mobility    Modified Rankin (Stroke Patients Only)       Balance Overall balance assessment: Needs assistance Sitting-balance support: Bilateral upper extremity  supported;Feet unsupported Sitting balance-Leahy Scale: Zero Sitting balance - Comments: pt tolerated EOB x 3.5 minutes with bil UE behind her to assist with prop sitting and trunk extension; pt chose to reach forward for rail and edge of mattress and required incr assist to maintian this position Postural control: Posterior lean                                   Pertinent Vitals/Pain On CPAP/PS with FiO2 35% with RR 22-26, SaO2 95% throughout; responded well to instructions for slow, deep breathing when she became anxious BP 120s/70s throughout    Home Living Family/patient expects to be discharged to:: Skilled nursing facility Living Arrangements: Alone                    Prior Function Level of Independence: Independent with assistive device(s)         Comments: Pt unable to provide accurate history and unable to confirm this info with family      Hand Dominance   Dominant Hand: Right    Extremity/Trunk Assessment   Upper Extremity Assessment: RUE deficits/detail;LUE deficits/detail RUE Deficits / Details: Pt able to flex shoulder only to 45 (nods yes when asked if limited before admission); elbow flexion/extension 3+     LUE Deficits / Details: AROM WFL, elbow flexion/extension 3+  Lower Extremity Assessment: RLE deficits/detail;LLE deficits/detail RLE Deficits / Details: Strength 0/5, absent sensation (pt denies any return of sensation) LLE Deficits / Details: Strength 0/5, absent sensation (pt denies return of any sensation)  Cervical / Trunk Assessment: Other exceptions  Communication   Communication: Tracheostomy  Cognition Arousal/Alertness: Awake/alert Behavior During Therapy: Anxious Overall Cognitive Status: Difficult to assess                      General Comments General comments (skin integrity, edema, etc.): no skin issues noted on heels or back    Exercises        Assessment/Plan    PT Assessment Patient needs  continued PT services  PT Diagnosis Other (comment) (T6 paraplegia)   PT Problem List Decreased strength;Decreased activity tolerance;Decreased balance;Decreased mobility;Cardiopulmonary status limiting activity  PT Treatment Interventions Functional mobility training;Therapeutic activities;Therapeutic exercise;Balance training;Patient/family education   PT Goals (Current goals can be found in the Care Plan section) Acute Rehab PT Goals Patient Stated Goal: agrees to goals of incr strength PT Goal Formulation: With patient Time For Goal Achievement: 05/10/14 Potential to Achieve Goals: Good    Frequency Min 2X/week   Barriers to discharge Decreased caregiver support;Other (comment) (vent dependent)      Co-evaluation               End of Session Equipment Utilized During Treatment: Oxygen (on wean mode on vent) Activity Tolerance: Patient limited by fatigue Patient left: in bed;with call bell/phone within reach Nurse Communication: Mobility status         Time: 0092-3300 PT Time Calculation (min): 38 min   Charges:   PT Evaluation $Initial PT Evaluation Tier I: 1 Procedure PT Treatments $Therapeutic Activity: 23-37 mins   PT G CodesJeanie Cooks Garnie Borchardt May 04, 2014, 2:59 PM Pager [phone]

## 2014-04-26 NOTE — Progress Notes (Signed)
CSW received call from [facility] rep with [place]. Rep to contact pt's husband today and schedule a day next week to complete Medicaid application. When this is complete, pt will discharge to [facility] in [place].   Ky Barban, MSW, [facility] Clinical Social Worker [phone]

## 2014-04-27 LAB — GLUCOSE, CAPILLARY
GLUCOSE-CAPILLARY: 79 mg/dL (ref 70–99)
GLUCOSE-CAPILLARY: 88 mg/dL (ref 70–99)
GLUCOSE-CAPILLARY: 97 mg/dL (ref 70–99)
Glucose-Capillary: 76 mg/dL (ref 70–99)
Glucose-Capillary: 89 mg/dL (ref 70–99)
Glucose-Capillary: 92 mg/dL (ref 70–99)

## 2014-04-27 LAB — BASIC METABOLIC PANEL
BUN: 18 mg/dL (ref 6–23)
CALCIUM: 9 mg/dL (ref 8.4–10.5)
CO2: 42 mEq/L (ref 19–32)
Chloride: 93 mEq/L — ABNORMAL LOW (ref 96–112)
Creatinine, Ser: 0.37 mg/dL — ABNORMAL LOW (ref 0.50–1.10)
GFR calc non Af Amer: >90 mL/min (ref >90)
GLUCOSE: 76 mg/dL (ref 70–99)
Potassium: 3.9 mEq/L (ref 3.7–5.3)
SODIUM: 142 meq/L (ref 137–147)

## 2014-04-27 LAB — HEPATIC FUNCTION PANEL
ALT: 14 U/L (ref 0–35)
AST: 20 U/L (ref 0–37)
Albumin: 1.8 g/dL — ABNORMAL LOW (ref 3.5–5.2)
Alkaline Phosphatase: 81 U/L (ref 39–117)
Bilirubin, Direct: <0.2 mg/dL (ref 0.0–0.3)
Total Protein: 5.6 g/dL — ABNORMAL LOW (ref 6.0–8.3)

## 2014-04-27 LAB — CBC
HCT: 27.7 % — ABNORMAL LOW (ref 36.0–46.0)
HEMOGLOBIN: 8.6 g/dL — AB (ref 12.0–15.0)
MCH: 26.7 pg (ref 26.0–34.0)
MCHC: 31 g/dL (ref 30.0–36.0)
MCV: 86 fL (ref 78.0–100.0)
Platelets: 476 10*3/uL — ABNORMAL HIGH (ref 150–400)
RBC: 3.22 MIL/uL — ABNORMAL LOW (ref 3.87–5.11)
RDW: 20 % — ABNORMAL HIGH (ref 11.5–15.5)
WBC: 7.8 10*3/uL (ref 4.0–10.5)

## 2014-04-27 LAB — CULTURE, BLOOD (ROUTINE X 2)
CULTURE: NO GROWTH
Culture: NO GROWTH

## 2014-04-27 MED ORDER — FUROSEMIDE 8 MG/ML PO SOLN
40.0000 mg | Freq: Every day | ORAL | Status: DC
  Administered 2014-04-27 – 2014-04-30 (×4): 40 mg
  Filled 2014-04-27 (×4): qty 5

## 2014-04-27 MED ORDER — VITAL HIGH PROTEIN PO LIQD: 1000.0000 mL | ORAL | Status: DC

## 2014-04-27 MED ORDER — MIDAZOLAM HCL 2 MG/2ML IJ SOLN
INTRAMUSCULAR | Status: AC
  Administered 2014-04-27: 0.5 mg via INTRAVENOUS
  Filled 2014-04-27: qty 2

## 2014-04-27 MED ORDER — MIDAZOLAM HCL 2 MG/2ML IJ SOLN: 0.5000 mg | Freq: Once | INTRAMUSCULAR | Status: AC

## 2014-04-27 MED ORDER — POTASSIUM CHLORIDE 20 MEQ/15ML (10%) PO LIQD
20.0000 meq | Freq: Every day | ORAL | Status: DC
  Administered 2014-04-27 – 2014-05-09 (×13): 20 meq
  Filled 2014-04-27 (×13): qty 15

## 2014-04-27 MED ORDER — MIDAZOLAM HCL 10 MG/2ML IJ SOLN: 0.5000 mg | Freq: Once | INTRAMUSCULAR | Status: DC

## 2014-04-27 MED ORDER — FREE WATER
200.0000 mL | Freq: Four times a day (QID) | Status: DC
  Administered 2014-04-27 – 2014-04-30 (×12): 200 mL

## 2014-04-27 MED ORDER — FUROSEMIDE 10 MG/ML PO SOLN
40.0000 mg | Freq: Every day | ORAL | Status: DC
  Filled 2014-04-27 (×3): qty 4

## 2014-04-27 NOTE — Progress Notes (Signed)
Placed pt back on full support due to pt states she is having panic a panic attack-RN and MD aware

## 2014-04-27 NOTE — Progress Notes (Signed)
Pt continues to be unable to tolerate trach--Medicaid rep with [place] in [place] to contact pt's husband to do Medicaid application next week. CSW following case and continues to coordinate discharge to vent SNF. [place] vent SNFs anticipate being full for the next 2-3 weeks, but pt is on the waiting list and husband has been informed that if pt discharges to [place] and a bed opens in [place], it is possible for her to switch to a facility closer to their home.   Ky Barban, MSW, [facility] Clinical Social Worker [phone]

## 2014-04-27 NOTE — Progress Notes (Signed)
PULMONARY / CRITICAL CARE MEDICINE  Name: Joanna Perez MRN: [id number] DOB: 07-27-1956    ADMISSION DATE:  04/07/2014 CONSULTATION DATE:  04/09/2014  REFERRING MD :  Dr. Annette Stable PRIMARY SERVICE: PCCM  CHIEF COMPLAINT:  Paraplegia   BRIEF PATIENT DESCRIPTION: 58 yo with COPD, CHF, DM, CAD s/p MI, atrial fibrillation transferred from [facility] for BLE paraplegia, found to have T6 fracture with spinal stenosis and spinal cord compression on MRI. She was taken for decompressive surgery and remained on mechanical ventilation post surgery.   SIGNIFICANT EVENTS / STUDIES:  5/16  Admitted to [facility], hypotensive and with new paraplegia 5/17  Transfer to [facility] 5/17  MR Cervical/Thoracic/Lumbar Spine - worsened T6 compression fracture with paraspinal and epidural hematoma causing cord compression and edema, possible early cord infarct 5/18  Intubation for procedure 5/18  Thoracic laminectomy 5/18  Extubated 5/19  Somnolent / hypercarbic >>> reintubated 5/26  Tracheostomy 5/28  TTE >>> Ef 65%, vegetation is not reported 6/4 G-tube placement  LINES / TUBES: ETT 5/18 >>> 5/18; 5/19 >>>5/26 R R AL 5/17 >>> out Foley 5/16 >>> R PICC 5/22 >>> 5/28 Trach 5/26 (df) >>> PEG 6/4>>>  CULTURES: 5/16  Blood >>>  COAG NEG STAPH 5/16  Urine >>> neg 5/25 Sputum >  S maltophilia multiresistant 5/26  Sputum >>> STENOTROPHOMONAS MALTOPHILIA 5/26  Blood >>> COAG NEG STAPH 5/30  Blood x3  >>>  ANTIBIOTICS per ID Vancomycin 5/17 >>> Levaquin 5/17 >>> 5/19 Imipenem 5/28 >>>6/1  INTERVAL HISTORY:  No distress, but anxious during ATC trial   VITAL SIGNS: Temp:  [97.6 F (36.4 C)-98.4 F (36.9 C)] 98.3 F (36.8 C) (06/05 1340) Pulse Rate:  [72-104] 72 (06/05 1340) Resp:  [14-27] 20 (06/05 1340) BP: (76-155)/(45-73) 103/49 mmHg (06/05 1340) SpO2:  [92 %-100 %] 98 % (06/05 1340) FiO2 (%):  [35 %-60 %] 40 % (06/05 1340) Weight:  [94.5 kg (208 lb 5.4 oz)] 94.5 kg (208 lb 5.4 oz) (06/05  0500)  HEMODYNAMICS: CVP:  [7 mmHg-12 mmHg] 8 mmHg  VENTILATOR SETTINGS: Vent Mode:  [-] PRVC FiO2 (%):  [35 %-60 %] 40 % Set Rate:  [20 bmp] 20 bmp Vt Set:  [400 mL] 400 mL PEEP:  [5 cmH20] 5 cmH20 Pressure Support:  [10 cmH20] 10 cmH20 Plateau Pressure:  [25 cmH20-28 cmH20] 25 cmH20  INTAKE / OUTPUT: Intake/Output     06/04 0701 - 06/05 0700 06/05 0701 - 06/06 0700   I.V. (mL/kg) 819.2 (8.7) 180 (1.9)   NG/GT     IV Piggyback     Total Intake(mL/kg) 819.2 (8.7) 180 (1.9)   Urine (mL/kg/hr) 1200 (0.5)    Drains 150 (0.1)    Total Output 1350     Net -530.8 +180        Urine Occurrence  300 x     PHYSICAL EXAMINATION: General:  nad on vent Neuro:  AAOx4, communicates on vent HEENT:  Tracheostomy site intact clean, dry Cardiovascular:  s1 s2 RRR Lungs:  Reduced bases, no accessory muscle use.  Abdomen:  Soft, bowel sounds wnl, obese, reducible ventral hernia Musculoskeletal:  No edema Skin:  No rash  LABS:  CBC  Recent Labs Lab 04/25/14 0350 04/26/14 0300 04/27/14 0230  WBC 10.8* 10.9* 7.8  HGB 8.1* 8.3* 8.6*  HCT 27.1* 28.1* 27.7*  PLT 550* 481* 476*   BMET  Recent Labs Lab 04/24/14 0415 04/25/14 0350 04/27/14 0230  NA 146 142 142  K 3.8 4.1 3.9  CL 100 96 93*  CO2 43* 43* 42*  BUN 29* 22 18  CREATININE 0.34* 0.33* 0.37*  GLUCOSE 134* 143* 76   Electrolytes  Recent Labs Lab 04/24/14 0415 04/25/14 0350 04/27/14 0230  CALCIUM 8.6 8.7 9.0   ABG No results found for this basename: PHART, PCO2ART, PO2ART,  in the last 168 hours Glucose  Recent Labs Lab 04/26/14 1545 04/26/14 1945 04/27/14 0016 04/27/14 0444 04/27/14 0825 04/27/14 1340  GLUCAP 80 123* 76 79 89 92   IMAGING: Ir Gastrostomy Tube Mod Sed  04/26/2014   CLINICAL DATA:  58 year old female with T6 fracture, cord compression and new onset paraplegia. Patient underwent neurosurgical decompression and has remained ventilator dependent since that time. She has dysphagia and  protein calorie malnutrition and a percutaneous gastrostomy tube is warranted for enteral nutrition and hydration.  EXAM: PERC PLACEMENT GASTROSTOMY  Date: 04/26/2014  PROCEDURE: 1. Fluoroscopically guided placement of percutaneous pull-through gastrostomy tube. Interventional Radiologist:  Criselda Peaches, MD  ANESTHESIA/SEDATION: Moderate (conscious) sedation was used. 2 mg Versed, 50 mcg Fentanyl were administered intravenously. The patient's vital signs were monitored continuously by radiology nursing throughout the procedure.  Sedation Time: 20 minutes  FLUOROSCOPY TIME:  5 minutes 20 seconds  CONTRAST:  79mL OMNIPAQUE IOHEXOL 300 MG/ML  SOLN  MEDICATIONS: 1 mg glucagon administered intravenously. Antibiotic prophylaxis was not administered. The patient has been on vancomycin and her upcoming dose is being Perez until a vancomycin trough level has been drawn. In order not interfere with appropriate dosing, additional antibiotic was not administered. The patient has an allergy to penicillin.  TECHNIQUE: Informed consent was obtained from the patient following explanation of the procedure, risks, benefits and alternatives. The patient understands, agrees and consents for the procedure. All questions were addressed. A time out was performed.  Maximal barrier sterile technique utilized including caps, mask, sterile gowns, sterile gloves, large sterile drape, hand hygiene, and chlorhexadine skin prep.  An angled catheter was advanced over a wire under fluoroscopic guidance through the nose, down the esophagus and into the body of the stomach. The stomach was then insufflated with several 100 ml of air. Fluoroscopy confirmed location of the gastric bubble, as well as inferior displacement of the barium stained colon. Under direct fluoroscopic guidance, a single T-tack was placed, and the anterior gastric wall drawn up against the anterior abdominal wall. Percutaneous access was then obtained into the mid gastric  body with an 18 gauge sheath needle. Aspiration of air, and injection of contrast material under fluoroscopy confirmed needle placement.  An Amplatz wire was advanced in the gastric body and the access needle exchanged for a 9-French vascular sheath. A snare device was advanced through the vascular sheath and an Amplatz wire advanced through the angled catheter. The Amplatz wire was successfully snared and this was pulled up through the esophagus and out the mouth. A 20-French Alinda Dooms MIC-PEG tube was then connected to the snare and pulled through the mouth, down the esophagus, into the stomach and out to the anterior abdominal wall. Hand injection of contrast material confirmed intragastric location. The T-tack retention suture was then cut. The pull through peg tube was then secured with the external bumper and capped.  The patient will be observed for several hours with the newly placed tube on low wall suction to evaluate for any post procedure complication. The patient tolerated the procedure well, there is no immediate complication.  IMPRESSION: Successful placement of a 20 French pull through gastrostomy tube.  Signed,  Criselda Peaches, MD  [facility]  [facility]   Electronically Signed   By: Jacqulynn Cadet M.D.   On: 04/26/2014 20:40   Dg Abd Portable 1v  04/26/2014   CLINICAL DATA:  Evaluate barium for gastrostomy tube.  EXAM: PORTABLE ABDOMEN - 1 VIEW  COMPARISON:  04/23/2014  FINDINGS: A feeding tube terminates in the region of the mid stomach. Barium opacifies the colon to the level of the distal transverse segment. There is extensive motion artifact, limiting further assessment of the abdomen. No gross basilar lung opacity.  IMPRESSION: Barium opacifies the colon to the level of the distal transverse segment.   Electronically Signed   By: Jorje Guild M.D.   On: 04/26/2014 06:32   ASSESSMENT / PLAN:  PULMONARY A: Acute on chronic  respiratory failure Respiratory muscle weakness secondary to cord compression / new T6 paraplegia  COPD with possible exacerbation Suspect OSA / OHS Tracheostomy status P:    Goal SpO2>92 Failed TC trial in am  PS wean as tolerated, will likely need vent/SNF, PS 5/5 is th goal for 4-6 hrs Wean upright in chair as able  DuoNeb / Albuterol / Pulmicort SLP  neg balance as able   CARDIOVASCULAR A:  Chronic diastolic heart failure Dyslipidemia P:  F/u LFT 6/5 Tele  RENAL A:   Stay balance positive to even Hypernatremia - improving P:   Cont free water via FT F/u chem 6/7  GASTROINTESTINAL A:   Nutrition GERD New onset generalized abd pain (suspect constipation as small BM's and refusing senna per RN) dysphagia P:   Protonix as preadmission senna TF to resume 6/5, will adv per protocol   HEMATOLOGIC A:   Anemia VTE Px P:  Limit phlebotomy when able SCD  INFECTIOUS A:   Bacteremia with COAG NEG STAPH ( in spite of Vancomycin ) R/O Endocarditis - no vegetation noted on ECHO 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, contaminant? Allergy PCN / cephalosporins P:  vanc still required, see duration ID note  ENDOCRINE  A:   Hyperglycemia, now normal on lantus, low 128 Hypothyroidism   P:   SSI, may need to add back basal dosing after tube feeds re-started.  Synthroid Increase synthroid, repeat check in 4 wks (~7/2)  NEUROLOGIC A:   T6 cord compression / paraplegia, s/p spinal cord decompression, still no LE movement Delirium resolved Anxiety provoked from poor weaning 6/5 P:   Fentanyl PRN Versed PRN Celexa, Xanax as preadmission will mobilize, to chair as goal Continued work with PT Back to vent, versed  Lavon Paganini. Titus Mould, MD, [place] Pgr: [place] [facility]

## 2014-04-27 NOTE — Progress Notes (Signed)
SLP Cancellation Note  Patient Details Name: ARLENE BRICKEL MRN: [id number] DOB: 03/13/1956   Cancelled treatment:        Pt requires vent support. Per MD note not ready for ATC trials. Will discontinue SLP orders and follow pt via trach team. Please reorder if pt progresses off vent.    Katherene Ponto Kahlea Cobert 04/27/2014, 7:42 AM

## 2014-04-27 NOTE — Progress Notes (Signed)
19 Days Post-Op  Subjective: G tube placed 6/4 On wall suction now  Objective: Vital signs in last 24 hours: Temp:  [97.6 F (36.4 C)-98.5 F (36.9 C)] 97.6 F (36.4 C) (06/05 0824) Pulse Rate:  [75-104] 91 (06/05 0824) Resp:  [14-30] 22 (06/05 0824) BP: (76-155)/(45-73) 104/60 mmHg (06/05 0824) SpO2:  [90 %-100 %] 94 % (06/05 0824) FiO2 (%):  [35 %-60 %] 40 % (06/05 0824) Weight:  [94.5 kg (208 lb 5.4 oz)] 94.5 kg (208 lb 5.4 oz) (06/05 0500) Last BM Date: 04/26/14  Intake/Output from previous day: 06/04 0701 - 06/05 0700 In: 819.2 [I.V.:819.2] Out: 1350 [Urine:1200; Drains:150] Intake/Output this shift: Total I/O In: 150 [I.V.:150] Out: -   PE:  Afeb; vss G tube intact wall suction - yellow fluid No blood noted Site clean and dry; NT +BS  Lab Results:   Recent Labs  04/26/14 0300 04/27/14 0230  WBC 10.9* 7.8  HGB 8.3* 8.6*  HCT 28.1* 27.7*  PLT 481* 476*   BMET  Recent Labs  04/25/14 0350 04/27/14 0230  NA 142 142  K 4.1 3.9  CL 96 93*  CO2 43* 42*  GLUCOSE 143* 76  BUN 22 18  CREATININE 0.33* 0.37*  CALCIUM 8.7 9.0   PT/INR No results found for this basename: LABPROT, INR,  in the last 72 hours ABG No results found for this basename: PHART, PCO2, PO2, HCO3,  in the last 72 hours  Studies/Results: Ir Gastrostomy Tube Mod Sed  04/26/2014   CLINICAL DATA:  58 year old female with T6 fracture, cord compression and new onset paraplegia. Patient underwent neurosurgical decompression and has remained ventilator dependent since that time. She has dysphagia and protein calorie malnutrition and a percutaneous gastrostomy tube is warranted for enteral nutrition and hydration.  EXAM: PERC PLACEMENT GASTROSTOMY  Date: 04/26/2014  PROCEDURE: 1. Fluoroscopically guided placement of percutaneous pull-through gastrostomy tube. Interventional Radiologist:  Criselda Peaches, MD  ANESTHESIA/SEDATION: Moderate (conscious) sedation was used. 2 mg Versed, 50 mcg  Fentanyl were administered intravenously. The patient's vital signs were monitored continuously by radiology nursing throughout the procedure.  Sedation Time: 20 minutes  FLUOROSCOPY TIME:  5 minutes 20 seconds  CONTRAST:  38mL OMNIPAQUE IOHEXOL 300 MG/ML  SOLN  MEDICATIONS: 1 mg glucagon administered intravenously. Antibiotic prophylaxis was not administered. The patient has been on vancomycin and her upcoming dose is being held until a vancomycin trough level has been drawn. In order not interfere with appropriate dosing, additional antibiotic was not administered. The patient has an allergy to penicillin.  TECHNIQUE: Informed consent was obtained from the patient following explanation of the procedure, risks, benefits and alternatives. The patient understands, agrees and consents for the procedure. All questions were addressed. A time out was performed.  Maximal barrier sterile technique utilized including caps, mask, sterile gowns, sterile gloves, large sterile drape, hand hygiene, and chlorhexadine skin prep.  An angled catheter was advanced over a wire under fluoroscopic guidance through the nose, down the esophagus and into the body of the stomach. The stomach was then insufflated with several 100 ml of air. Fluoroscopy confirmed location of the gastric bubble, as well as inferior displacement of the barium stained colon. Under direct fluoroscopic guidance, a single T-tack was placed, and the anterior gastric wall drawn up against the anterior abdominal wall. Percutaneous access was then obtained into the mid gastric body with an 18 gauge sheath needle. Aspiration of air, and injection of contrast material under fluoroscopy confirmed needle placement.  An Amplatz  wire was advanced in the gastric body and the access needle exchanged for a 9-French vascular sheath. A snare device was advanced through the vascular sheath and an Amplatz wire advanced through the angled catheter. The Amplatz wire was successfully  snared and this was pulled up through the esophagus and out the mouth. A 20-French Alinda Dooms MIC-PEG tube was then connected to the snare and pulled through the mouth, down the esophagus, into the stomach and out to the anterior abdominal wall. Hand injection of contrast material confirmed intragastric location. The T-tack retention suture was then cut. The pull through peg tube was then secured with the external bumper and capped.  The patient will be observed for several hours with the newly placed tube on low wall suction to evaluate for any post procedure complication. The patient tolerated the procedure well, there is no immediate complication.  IMPRESSION: Successful placement of a 20 French pull through gastrostomy tube.  Signed,  Criselda Peaches, MD  [facility]  [facility]   Electronically Signed   By: Jacqulynn Cadet M.D.   On: 04/26/2014 20:40   Dg Abd Portable 1v  04/26/2014   CLINICAL DATA:  Evaluate barium for gastrostomy tube.  EXAM: PORTABLE ABDOMEN - 1 VIEW  COMPARISON:  04/23/2014  FINDINGS: A feeding tube terminates in the region of the mid stomach. Barium opacifies the colon to the level of the distal transverse segment. There is extensive motion artifact, limiting further assessment of the abdomen. No gross basilar lung opacity.  IMPRESSION: Barium opacifies the colon to the level of the distal transverse segment.   Electronically Signed   By: Jorje Guild M.D.   On: 04/26/2014 06:32    Anti-infectives: Anti-infectives   Start     Dose/Rate Route Frequency Ordered Stop   04/26/14 1800  vancomycin (VANCOCIN) IVPB 1000 mg/200 mL premix     1,000 mg 200 mL/hr over 60 Minutes Intravenous Every 18 hours 04/26/14 1704     04/23/14 1600  vancomycin (VANCOCIN) IVPB 1000 mg/200 mL premix  Status:  Discontinued     1,000 mg 200 mL/hr over 60 Minutes Intravenous Every 24 hours 04/22/14 1804 04/26/14 1704   04/19/14 1315   imipenem-cilastatin (PRIMAXIN) 500 mg in sodium chloride 0.9 % 100 mL IVPB  Status:  Discontinued     500 mg 200 mL/hr over 30 Minutes Intravenous 4 times per day 04/19/14 1311 04/23/14 1419   04/13/14 1730  vancomycin (VANCOCIN) IVPB 1000 mg/200 mL premix  Status:  Discontinued     1,000 mg 200 mL/hr over 60 Minutes Intravenous Every 12 hours 04/13/14 0847 04/22/14 1804   04/10/14 1730  vancomycin (VANCOCIN) IVPB 750 mg/150 ml premix  Status:  Discontinued     750 mg 150 mL/hr over 60 Minutes Intravenous Every 12 hours 04/10/14 1112 04/10/14 1116   04/10/14 1730  vancomycin (VANCOCIN) IVPB 750 mg/150 ml premix  Status:  Discontinued     750 mg 150 mL/hr over 60 Minutes Intravenous Every 12 hours 04/10/14 1136 04/13/14 0847   04/08/14 2317  bacitracin 50,000 Units in sodium chloride irrigation 0.9 % 500 mL irrigation  Status:  Discontinued       As needed 04/08/14 2317 04/09/14 0049   04/08/14 1630  vancomycin (VANCOCIN) IVPB 750 mg/150 ml premix  Status:  Discontinued     750 mg 150 mL/hr over 60 Minutes Intravenous Every 12 hours 04/08/14 1538 04/10/14 1111   04/08/14 1000  levofloxacin (LEVAQUIN) tablet 500  mg  Status:  Discontinued     500 mg Oral Daily 04/07/14 1636 04/10/14 1116   04/07/14 1615  levofloxacin (LEVAQUIN) tablet 500 mg  Status:  Discontinued     500 mg Oral Daily 04/07/14 1611 04/07/14 1635      Assessment/Plan: s/p Procedure(s): THORACIC LAMINECTOMY  (N/A)  G tube intact May dc wall suction  May use now   LOS: 20 days    Lavonia Drafts 04/27/2014

## 2014-04-27 NOTE — Progress Notes (Signed)
RT placed on ATC -pt tol well

## 2014-04-28 DIAGNOSIS — D649 Anemia, unspecified: Secondary | ICD-10-CM

## 2014-04-28 LAB — GLUCOSE, CAPILLARY
GLUCOSE-CAPILLARY: 107 mg/dL — AB (ref 70–99)
GLUCOSE-CAPILLARY: 146 mg/dL — AB (ref 70–99)
Glucose-Capillary: 138 mg/dL — ABNORMAL HIGH (ref 70–99)
Glucose-Capillary: 170 mg/dL — ABNORMAL HIGH (ref 70–99)
Glucose-Capillary: 189 mg/dL — ABNORMAL HIGH (ref 70–99)
Glucose-Capillary: 147 mg/dL — ABNORMAL HIGH (ref 70–99)

## 2014-04-28 NOTE — Progress Notes (Signed)
PULMONARY / CRITICAL CARE MEDICINE  Name: Joanna Perez MRN: [id number] DOB: Apr 11, 1956    ADMISSION DATE:  04/07/2014 CONSULTATION DATE:  04/09/2014  REFERRING MD :  Dr. Annette Stable PRIMARY SERVICE: PCCM  CHIEF COMPLAINT:  Paraplegia   BRIEF PATIENT DESCRIPTION: 58 yo with COPD, CHF, DM, CAD s/p MI, atrial fibrillation transferred from [facility] for BLE paraplegia, found to have T6 fracture with spinal stenosis and spinal cord compression on MRI. She was taken for decompressive surgery and remained on mechanical ventilation post surgery.   SIGNIFICANT EVENTS / STUDIES:  5/16  Admitted to [facility], hypotensive and with new paraplegia 5/17  Transfer to [facility] 5/17  MR Cervical/Thoracic/Lumbar Spine - worsened T6 compression fracture with paraspinal and epidural hematoma causing cord compression and edema, possible early cord infarct 5/18  Intubation for procedure 5/18  Thoracic laminectomy 5/18  Extubated 5/19  Somnolent / hypercarbic >>> reintubated 5/26  Tracheostomy 5/28  TTE >>> Ef 65%, vegetation is not reported 6/4 G-tube placement  LINES / TUBES: ETT 5/18 >>> 5/18; 5/19 >>>5/26 R R AL 5/17 >>> out Foley 5/16 >>> R PICC 5/22 >>> 5/28 Trach 5/26 (df) >>> PEG 6/4>>>  CULTURES: 5/16  Blood >>>  COAG NEG STAPH 5/16  Urine >>> neg 5/25 Sputum >  S maltophilia multiresistant 5/26  Sputum >>> STENOTROPHOMONAS MALTOPHILIA 5/26  Blood >>> COAG NEG STAPH 5/30  Blood x3  >>>  ANTIBIOTICS per ID Vancomycin 5/17 >>> Levaquin 5/17 >>> 5/19 Imipenem 5/28 >>>6/1  INTERVAL HISTORY:  Asleep after pain med earlier, woke easily. Family here. She denies dyspnea on vent. Admits back and (vague) abdominal soreness.  VITAL SIGNS: Temp:  [97.1 F (36.2 C)-98.9 F (37.2 C)] 97.1 F (36.2 C) (06/06 0800) Pulse Rate:  [69-93] 84 (06/06 0900) Resp:  [18-25] 20 (06/06 0900) BP: (103-132)/(49-95) 111/62 mmHg (06/06 0600) SpO2:  [92 %-100 %] 94 % (06/06 0900) FiO2 (%):  [40 %] 40 %  (06/06 0732) Weight:  [93.4 kg (205 lb 14.6 oz)] 93.4 kg (205 lb 14.6 oz) (06/06 0400)  HEMODYNAMICS: CVP:  [10 mmHg-16 mmHg] 10 mmHg  VENTILATOR SETTINGS: Vent Mode:  [-] PRVC FiO2 (%):  [40 %] 40 % Set Rate:  [20 bmp] 20 bmp Vt Set:  [400 mL] 400 mL PEEP:  [5 cmH20] 5 cmH20 Pressure Support:  [12 cmH20] 12 cmH20 Plateau Pressure:  [22 cmH20-25 cmH20] 23 cmH20  INTAKE / OUTPUT: Intake/Output     06/05 0701 - 06/06 0700 06/06 0701 - 06/07 0700   I.V. (mL/kg) 550 (5.9) 30 (0.3)   NG/GT 800 280   IV Piggyback 200    Total Intake(mL/kg) 1550 (16.6) 310 (3.3)   Urine (mL/kg/hr) 2200 (1) 450 (1.5)   Drains     Total Output 2200 450   Net -650 -140        Urine Occurrence 300 x      PHYSICAL EXAMINATION: General:  nad on vent Neuro:  AAOx4, communicates on vent, mouthing words HEENT:  Tracheostomy site intact clean, dry Cardiovascular:  s1 s2 RRR Lungs:  Reduced bases, no accessory muscle use.  Abdomen:  Soft, bowel sounds wnl, obese, reducible ventral hernia, no guarding Musculoskeletal:  No edema Skin:  No rash  LABS:  CBC  Recent Labs Lab 04/25/14 0350 04/26/14 0300 04/27/14 0230  WBC 10.8* 10.9* 7.8  HGB 8.1* 8.3* 8.6*  HCT 27.1* 28.1* 27.7*  PLT 550* 481* 476*   BMET  Recent Labs Lab 04/24/14 0415 04/25/14 0350  04/27/14 0230  NA 146 142 142  K 3.8 4.1 3.9  CL 100 96 93*  CO2 43* 43* 42*  BUN 29* 22 18  CREATININE 0.34* 0.33* 0.37*  GLUCOSE 134* 143* 76   Electrolytes  Recent Labs Lab 04/24/14 0415 04/25/14 0350 04/27/14 0230  CALCIUM 8.6 8.7 9.0   ABG No results found for this basename: PHART, PCO2ART, PO2ART,  in the last 168 hours Glucose  Recent Labs Lab 04/27/14 1340 04/27/14 1720 04/27/14 2012 04/28/14 0056 04/28/14 0518 04/28/14 0748  GLUCAP 92 88 97 107* 138* 146*   IMAGING: Ir Gastrostomy Tube Mod Sed  04/26/2014   CLINICAL DATA:  58 year old female with T6 fracture, cord compression and new onset paraplegia. Patient  underwent neurosurgical decompression and has remained ventilator dependent since that time. She has dysphagia and protein calorie malnutrition and a percutaneous gastrostomy tube is warranted for enteral nutrition and hydration.  EXAM: PERC PLACEMENT GASTROSTOMY  Date: 04/26/2014  PROCEDURE: 1. Fluoroscopically guided placement of percutaneous pull-through gastrostomy tube. Interventional Radiologist:  Criselda Peaches, MD  ANESTHESIA/SEDATION: Moderate (conscious) sedation was used. 2 mg Versed, 50 mcg Fentanyl were administered intravenously. The patient's vital signs were monitored continuously by radiology nursing throughout the procedure.  Sedation Time: 20 minutes  FLUOROSCOPY TIME:  5 minutes 20 seconds  CONTRAST:  58mL OMNIPAQUE IOHEXOL 300 MG/ML  SOLN  MEDICATIONS: 1 mg glucagon administered intravenously. Antibiotic prophylaxis was not administered. The patient has been on vancomycin and her upcoming dose is being held until a vancomycin trough level has been drawn. In order not interfere with appropriate dosing, additional antibiotic was not administered. The patient has an allergy to penicillin.  TECHNIQUE: Informed consent was obtained from the patient following explanation of the procedure, risks, benefits and alternatives. The patient understands, agrees and consents for the procedure. All questions were addressed. A time out was performed.  Maximal barrier sterile technique utilized including caps, mask, sterile gowns, sterile gloves, large sterile drape, hand hygiene, and chlorhexadine skin prep.  An angled catheter was advanced over a wire under fluoroscopic guidance through the nose, down the esophagus and into the body of the stomach. The stomach was then insufflated with several 100 ml of air. Fluoroscopy confirmed location of the gastric bubble, as well as inferior displacement of the barium stained colon. Under direct fluoroscopic guidance, a single T-tack was placed, and the anterior  gastric wall drawn up against the anterior abdominal wall. Percutaneous access was then obtained into the mid gastric body with an 18 gauge sheath needle. Aspiration of air, and injection of contrast material under fluoroscopy confirmed needle placement.  An Amplatz wire was advanced in the gastric body and the access needle exchanged for a 9-French vascular sheath. A snare device was advanced through the vascular sheath and an Amplatz wire advanced through the angled catheter. The Amplatz wire was successfully snared and this was pulled up through the esophagus and out the mouth. A 20-French Alinda Dooms MIC-PEG tube was then connected to the snare and pulled through the mouth, down the esophagus, into the stomach and out to the anterior abdominal wall. Hand injection of contrast material confirmed intragastric location. The T-tack retention suture was then cut. The pull through peg tube was then secured with the external bumper and capped.  The patient will be observed for several hours with the newly placed tube on low wall suction to evaluate for any post procedure complication. The patient tolerated the procedure well, there is no immediate complication.  IMPRESSION: Successful placement of a 20 French pull through gastrostomy tube.  Signed,  Criselda Peaches, MD  [facility]  [facility]   Electronically Signed   By: Jacqulynn Cadet M.D.   On: 04/26/2014 20:40   ASSESSMENT / PLAN:  PULMONARY A: Acute on chronic respiratory failure Respiratory muscle weakness secondary to cord compression / new T6 paraplegia  COPD with possible exacerbation Suspect OSA / OHS Tracheostomy status P:    Goal SpO2>92 Failed TC trial in am 6/5 PS wean as tolerated, will likely need vent/SNF, PS 5/5 is th goal for 4-6 hrs Wean upright in chair as able  DuoNeb / Albuterol / Pulmicort SLP  neg balance as able   CARDIOVASCULAR A:  Chronic diastolic heart  failure Dyslipidemia P:  F/u LFT 6/5 Tele  RENAL A:   Stay balance positive to even Hypernatremia - improving P:   Cont free water via FT F/u chem 6/7  GASTROINTESTINAL A:   Nutrition GERD New onset generalized abd pain (suspect constipation as small BM's and refusing senna per RN) dysphagia P:   Protonix as preadmission senna TF to resume 6/5, will adv per protocol   HEMATOLOGIC A:   Anemia VTE Px P:  Limit phlebotomy when able SCD  INFECTIOUS A:   Bacteremia with COAG NEG STAPH ( in spite of Vancomycin ) R/O Endocarditis - no vegetation noted on ECHO 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, contaminant? Allergy PCN / cephalosporins P:  vanc still required, see duration ID note  ENDOCRINE  A:   Hyperglycemia, now normal on lantus, low 128 Hypothyroidism   P:   SSI, may need to add back basal dosing after tube feeds re-started.  Synthroid Increase synthroid, repeat check in 4 wks (~7/2)  NEUROLOGIC A:   T6 cord compression / paraplegia, s/p spinal cord decompression, still no LE movement Delirium resolved Anxiety provoked from poor weaning 6/5 P:   Fentanyl PRN Versed PRN Celexa, Xanax as preadmission will mobilize, to chair as goal Continued work with PT Back to vent, versed  [place], [place] P [phone]  m [phone] [facility]

## 2014-04-29 LAB — GLUCOSE, CAPILLARY
GLUCOSE-CAPILLARY: 108 mg/dL — AB (ref 70–99)
GLUCOSE-CAPILLARY: 116 mg/dL — AB (ref 70–99)
GLUCOSE-CAPILLARY: 154 mg/dL — AB (ref 70–99)
Glucose-Capillary: 137 mg/dL — ABNORMAL HIGH (ref 70–99)
Glucose-Capillary: 157 mg/dL — ABNORMAL HIGH (ref 70–99)
Glucose-Capillary: 120 mg/dL — ABNORMAL HIGH (ref 70–99)

## 2014-04-29 LAB — BASIC METABOLIC PANEL
BUN: 21 mg/dL (ref 6–23)
CALCIUM: 8.6 mg/dL (ref 8.4–10.5)
CO2: 41 mEq/L (ref 19–32)
Chloride: 94 mEq/L — ABNORMAL LOW (ref 96–112)
Creatinine, Ser: 0.34 mg/dL — ABNORMAL LOW (ref 0.50–1.10)
GFR calc Af Amer: >90 mL/min (ref >90)
GLUCOSE: 139 mg/dL — AB (ref 70–99)
Potassium: 4.4 mEq/L (ref 3.7–5.3)
Sodium: 139 mEq/L (ref 137–147)

## 2014-04-29 LAB — VANCOMYCIN, TROUGH: VANCOMYCIN TR: 16.8 ug/mL (ref 10.0–20.0)

## 2014-04-29 MED ORDER — VANCOMYCIN HCL IN DEXTROSE 750-5 MG/150ML-% IV SOLN
750.0000 mg | Freq: Two times a day (BID) | INTRAVENOUS | Status: AC
  Administered 2014-04-30 – 2014-05-05 (×12): 750 mg via INTRAVENOUS
  Filled 2014-04-29 (×12): qty 150

## 2014-04-29 NOTE — Progress Notes (Signed)
Pt c/o feeling like its hard to breathe. Pt suctioned with moderate amount of yellow, tenacious secretions. Pt tried on PSV 40% 12/5. Pt also c/o bad headache. Pain meds and anxiety meds given. Pt placed back on full support. Will attempt wean again at later time. RT will continue to monitor.

## 2014-04-29 NOTE — Progress Notes (Signed)
PULMONARY / CRITICAL CARE MEDICINE  Name: Joanna Perez MRN: [id number] DOB: June 18, 1956    ADMISSION DATE:  04/07/2014 CONSULTATION DATE:  04/09/2014  REFERRING MD :  Dr. Annette Stable PRIMARY SERVICE: PCCM  CHIEF COMPLAINT:  Paraplegia   BRIEF PATIENT DESCRIPTION: 58 yo with COPD, CHF, DM, CAD s/p MI, atrial fibrillation transferred from [facility] for BLE paraplegia, found to have T6 fracture with spinal stenosis and spinal cord compression on MRI. She was taken for decompressive surgery and remained on mechanical ventilation post surgery.   SIGNIFICANT EVENTS / STUDIES:  5/16  Admitted to [facility], hypotensive and with new paraplegia 5/17  Transfer to [facility] 5/17  MR Cervical/Thoracic/Lumbar Spine - worsened T6 compression fracture with paraspinal and epidural hematoma causing cord compression and edema, possible early cord infarct 5/18  Intubation for procedure 5/18  Thoracic laminectomy 5/18  Extubated 5/19  Somnolent / hypercarbic >>> reintubated 5/26  Tracheostomy 5/28  TTE >>> Ef 65%, vegetation is not reported 6/4 G-tube placement  LINES / TUBES: ETT 5/18 >>> 5/18; 5/19 >>>5/26 R R AL 5/17 >>> out Foley 5/16 >>> R PICC 5/22 >>> 5/28 Trach 5/26 (df) >>> PEG 6/4>>>  CULTURES: 5/16  Blood >>>  COAG NEG STAPH 5/16  Urine >>> neg 5/25 Sputum >  S maltophilia multiresistant 5/26  Sputum >>> STENOTROPHOMONAS MALTOPHILIA 5/26  Blood >>> COAG NEG STAPH 5/30  Blood x3  >>>  ANTIBIOTICS per ID Vancomycin 5/17 >>> Levaquin 5/17 >>> 5/19 Imipenem 5/28 >>>6/1  INTERVAL HISTORY:  Breathing comfort marginal. RT thought she did better on Pressure Support. C/O HA and persistent soreness in back and assoc w/ ventral hernia  VITAL SIGNS: Temp:  [97.1 F (36.2 C)-98.9 F (37.2 C)] 97.1 F (36.2 C) (06/06 0800) Pulse Rate:  [69-93] 84 (06/06 0900) Resp:  [18-25] 20 (06/06 0900) BP: (103-132)/(49-95) 111/62 mmHg (06/06 0600) SpO2:  [92 %-100 %] 94 % (06/06 0900) FiO2 (%):   [40 %] 40 % (06/06 0732) Weight:  [93.4 kg (205 lb 14.6 oz)] 93.4 kg (205 lb 14.6 oz) (06/06 0400)  HEMODYNAMICS: CVP:  [10 mmHg-16 mmHg] 10 mmHg  VENTILATOR SETTINGS: Vent Mode:  [-] PRVC  RT trying PS 12/5 40% FiO2 (%):  [40 %] 40 % Set Rate:  [20 bmp] 20 bmp Vt Set:  [400 mL] 400 mL PEEP:  [5 cmH20] 5 cmH20 Pressure Support:  [12 cmH20] 12 cmH20 Plateau Pressure:  [22 cmH20-25 cmH20] 23 cmH20  INTAKE / OUTPUT: Intake/Output     06/05 0701 - 06/06 0700 06/06 0701 - 06/07 0700   I.V. (mL/kg) 550 (5.9) 30 (0.3)   NG/GT 800 280   IV Piggyback 200    Total Intake(mL/kg) 1550 (16.6) 310 (3.3)   Urine (mL/kg/hr) 2200 (1) 450 (1.5)   Drains     Total Output 2200 450   Net -650 -140        Urine Occurrence 300 x      PHYSICAL EXAMINATION: General:  nad on vent Neuro:  AAOx4, communicates on vent, mouthing words HEENT:  Tracheostomy site intact clean, dry Cardiovascular:  s1 s2 RRR Lungs:  Reduced bases, no accessory muscle use.  Abdomen:  Soft, bowel sounds wnl, obese,  Soft reducible ventral hernia, no guarding Musculoskeletal:  No edema Skin:  No rash  LABS:  CBC  Recent Labs Lab 04/25/14 0350 04/26/14 0300 04/27/14 0230  WBC 10.8* 10.9* 7.8  HGB 8.1* 8.3* 8.6*  HCT 27.1* 28.1* 27.7*  PLT 550* 481* 476*  BMET  Recent Labs Lab 04/24/14 0415 04/25/14 0350 04/27/14 0230  NA 146 142 142  K 3.8 4.1 3.9  CL 100 96 93*  CO2 43* 43* 42*  BUN 29* 22 18  CREATININE 0.34* 0.33* 0.37*  GLUCOSE 134* 143* 76   Electrolytes  Recent Labs Lab 04/24/14 0415 04/25/14 0350 04/27/14 0230  CALCIUM 8.6 8.7 9.0   ABG No results found for this basename: PHART, PCO2ART, PO2ART,  in the last 168 hours Glucose  Recent Labs Lab 04/27/14 1340 04/27/14 1720 04/27/14 2012 04/28/14 0056 04/28/14 0518 04/28/14 0748  GLUCAP 92 88 97 107* 138* 146*   IMAGING: Ir Gastrostomy Tube Mod Sed  04/26/2014   CLINICAL DATA:  58 year old female with T6 fracture, cord  compression and new onset paraplegia. Patient underwent neurosurgical decompression and has remained ventilator dependent since that time. She has dysphagia and protein calorie malnutrition and a percutaneous gastrostomy tube is warranted for enteral nutrition and hydration.  EXAM: PERC PLACEMENT GASTROSTOMY  Date: 04/26/2014  PROCEDURE: 1. Fluoroscopically guided placement of percutaneous pull-through gastrostomy tube. Interventional Radiologist:  Criselda Peaches, MD  ANESTHESIA/SEDATION: Moderate (conscious) sedation was used. 2 mg Versed, 50 mcg Fentanyl were administered intravenously. The patient's vital signs were monitored continuously by radiology nursing throughout the procedure.  Sedation Time: 20 minutes  FLUOROSCOPY TIME:  5 minutes 20 seconds  CONTRAST:  30mL OMNIPAQUE IOHEXOL 300 MG/ML  SOLN  MEDICATIONS: 1 mg glucagon administered intravenously. Antibiotic prophylaxis was not administered. The patient has been on vancomycin and her upcoming dose is being held until a vancomycin trough level has been drawn. In order not interfere with appropriate dosing, additional antibiotic was not administered. The patient has an allergy to penicillin.  TECHNIQUE: Informed consent was obtained from the patient following explanation of the procedure, risks, benefits and alternatives. The patient understands, agrees and consents for the procedure. All questions were addressed. A time out was performed.  Maximal barrier sterile technique utilized including caps, mask, sterile gowns, sterile gloves, large sterile drape, hand hygiene, and chlorhexadine skin prep.  An angled catheter was advanced over a wire under fluoroscopic guidance through the nose, down the esophagus and into the body of the stomach. The stomach was then insufflated with several 100 ml of air. Fluoroscopy confirmed location of the gastric bubble, as well as inferior displacement of the barium stained colon. Under direct fluoroscopic guidance, a  single T-tack was placed, and the anterior gastric wall drawn up against the anterior abdominal wall. Percutaneous access was then obtained into the mid gastric body with an 18 gauge sheath needle. Aspiration of air, and injection of contrast material under fluoroscopy confirmed needle placement.  An Amplatz wire was advanced in the gastric body and the access needle exchanged for a 9-French vascular sheath. A snare device was advanced through the vascular sheath and an Amplatz wire advanced through the angled catheter. The Amplatz wire was successfully snared and this was pulled up through the esophagus and out the mouth. A 20-French Alinda Dooms MIC-PEG tube was then connected to the snare and pulled through the mouth, down the esophagus, into the stomach and out to the anterior abdominal wall. Hand injection of contrast material confirmed intragastric location. The T-tack retention suture was then cut. The pull through peg tube was then secured with the external bumper and capped.  The patient will be observed for several hours with the newly placed tube on low wall suction to evaluate for any post procedure complication. The patient tolerated  the procedure well, there is no immediate complication.  IMPRESSION: Successful placement of a 20 French pull through gastrostomy tube.  Signed,  Criselda Peaches, MD  [facility]  [facility]   Electronically Signed   By: Jacqulynn Cadet M.D.   On: 04/26/2014 20:40   ASSESSMENT / PLAN:  PULMONARY A: Acute on chronic respiratory failure Respiratory muscle weakness secondary to cord compression / new T6 paraplegia  COPD with possible exacerbation Suspect OSA / OHS Tracheostomy status Needing frequent suctioning-thick white P:    Goal SpO2>92 Failed TC trial in am 6/5, 6/6, 6/7 RT trying Vent mode PS 12/5 40% PS wean as tolerated, will likely need vent/SNF, PS 5/5 is th goal for 4-6 hrs Wean upright in  chair as able  DuoNeb / Albuterol / Pulmicort SLP  neg balance as able  -CXR 6/8  CARDIOVASCULAR A:  Chronic diastolic heart failure Dyslipidemia P:  F/u LFT 6/5 Tele  RENAL A:   Stay balance positive to even Hypernatremia - improving P:   Cont free water via FT F/u chem   GASTROINTESTINAL A:   Nutrition GERD New onset generalized abd pain (suspect constipation as small BM's and refusing senna per RN) dysphagia P:   Protonix as preadmission senna TF  resumed 6/5, will adv per protocol   HEMATOLOGIC A:   Anemia VTE Px P:  Limit phlebotomy when able SCD  INFECTIOUS A:   Bacteremia with COAG NEG STAPH ( in spite of Vancomycin ) R/O Endocarditis - no vegetation noted on ECHO 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, contaminant? Allergy PCN / cephalosporins P:  vanc still required, see duration ID note  ENDOCRINE  A:   Hyperglycemia, now normal on lantus, low 128 Hypothyroidism   P:   SSI, may need to add back basal dosing after tube feeds re-started.  Synthroid Increase synthroid, repeat check in 4 wks (~7/2)  NEUROLOGIC A:   T6 cord compression / paraplegia, s/p spinal cord decompression, still no LE movement Delirium resolved Anxiety provoked from poor weaning 6/5 P:   Fentanyl PRN Versed PRN Celexa, Xanax as preadmission will mobilize, to chair as goal Continued work with PT Back to vent, versed  [place], [place] P [phone]  m [phone] [facility]

## 2014-04-29 NOTE — Progress Notes (Signed)
ANTIBIOTIC CONSULT NOTE - FOLLOW UP  Pharmacy Consult for vancomycin Indication: CNS bacteremia with PCN allergy  Allergies  Allergen Reactions  . Amoxicillin Hives  . Bactrim [Sulfamethoxazole-Tmp Ds] Hives  . Erythromycin Hives  . Keflex [Cephalexin] Hives  . Penicillins Itching and Swelling    Sweating    Patient Measurements: Height: 5\' 2"  (157.5 cm) Weight: 208 lb 12.4 oz (94.7 kg) IBW/kg (Calculated) : 50.1 Vital Signs: Temp: 98.1 F (36.7 C) (06/07 1600) Temp src: Oral (06/07 1600) BP: 104/59 mmHg (06/07 1717) Pulse Rate: 83 (06/07 1717) Intake/Output from previous day: 06/06 0701 - 06/07 0700 In: 2430 [I.V.:250; NG/GT:1980; IV Piggyback:200] Out: 1050 [Urine:1050] Intake/Output from this shift: Total I/O In: 360 [I.V.:60; NG/GT:300] Out: 350 [Urine:350]  Labs:  Recent Labs  04/27/14 0230 04/29/14 0620  WBC 7.8  --   HGB 8.6*  --   PLT 476*  --   CREATININE 0.37* 0.34*   Estimated Creatinine Clearance: 83.2 ml/min (by C-G formula based on Cr of 0.34).  Recent Labs  04/29/14 1620  VANCOTROUGH 16.8     Microbiology: Recent Results (from the past 720 hour(s))  URINE CULTURE     Status: None   Collection Time    04/07/14 11:50 AM      Result Value Ref Range Status   Specimen Description URINE, CATHETERIZED   Final   Special Requests NONE   Final   Culture  Setup Time     Final   Value: 04/08/2014 01:35     Performed at [facility]     Final   Value: NO GROWTH     Performed at [facility]   Culture     Final   Value: NO GROWTH     Performed at [facility]   Report Status 04/09/2014 FINAL   Final  CULTURE, BLOOD (ROUTINE X 2)     Status: None   Collection Time    04/07/14 12:02 PM      Result Value Ref Range Status   Specimen Description BLOOD RIGHT ANTECUBITAL   Final   Special Requests BOTTLES DRAWN AEROBIC ONLY [facility]   Final   Culture  Setup Time     Final   Value: 04/08/2014 21:13     Performed  at [facility]   Culture     Final   Value: STAPHYLOCOCCUS SPECIES (COAGULASE NEGATIVE)     Note: RIFAMPIN AND GENTAMICIN SHOULD NOT BE USED AS SINGLE DRUGS FOR TREATMENT OF STAPH INFECTIONS.     Note: Performed at [facility]     Performed at [facility]   Report Status 04/10/2014 FINAL   Final   Organism ID, Bacteria STAPHYLOCOCCUS SPECIES (COAGULASE NEGATIVE)   Final  CULTURE, BLOOD (ROUTINE X 2)     Status: None   Collection Time    04/07/14 12:02 PM      Result Value Ref Range Status   Specimen Description BLOOD RIGHT HAND   Final   Special Requests BOTTLES DRAWN AEROBIC AND ANAEROBIC 8CC   Final   Culture  Setup Time     Final   Value: 04/08/2014 21:16     Performed at [facility]   Culture     Final   Value: STAPHYLOCOCCUS SPECIES (COAGULASE NEGATIVE)     Note: SUSCEPTIBILITIES PERFORMED ON PREVIOUS CULTURE WITHIN THE LAST 5 DAYS.     Note: Performed at [facility] to,Read  Back By and Verified With: STONE A 04/08/14 0811 BY THOMPSON S     Performed at [facility]   Report Status 04/10/2014 FINAL   Final  SURGICAL PCR SCREEN     Status: None   Collection Time    04/08/14  7:54 PM      Result Value Ref Range Status   MRSA, PCR NEGATIVE  NEGATIVE Final   Staphylococcus aureus NEGATIVE  NEGATIVE Final   Comment:            The Xpert SA Assay (FDA     approved for NASAL specimens     in patients over 30 years of age),     is one component of     a comprehensive surveillance     program.  Test performance has     been validated by [employer] for patients greater     than or equal to 86 year old.     It is not intended     to diagnose infection nor to     guide or monitor treatment.  CULTURE, RESPIRATORY (NON-EXPECTORATED)     Status: None   Collection Time    04/16/14  6:06 PM      Result Value Ref Range Status   Specimen Description TRACHEAL ASPIRATE   Final   Special Requests NONE    Final   Gram Stain     Final   Value: FEW WBC PRESENT,BOTH PMN AND MONONUCLEAR     RARE SQUAMOUS EPITHELIAL CELLS PRESENT     RARE GRAM POSITIVE COCCI     IN PAIRS     Performed at [facility]   Culture     Final   Value: ABUNDANT STENOTROPHOMONAS MALTOPHILIA     Note: SUSCEPTIBILITIES PERFORMED ON PREVIOUS CULTURE WITHIN THE LAST 5 DAYS.     Performed at [facility]   Report Status 04/21/2014 FINAL   Final  CULTURE, BLOOD (ROUTINE X 2)     Status: None   Collection Time    04/17/14  9:35 AM      Result Value Ref Range Status   Specimen Description BLOOD LEFT ANTECUBITAL   Final   Special Requests BOTTLES DRAWN AEROBIC ONLY 1CC   Final   Culture  Setup Time     Final   Value: 04/17/2014 14:03     Performed at [facility]   Culture     Final   Value: STAPHYLOCOCCUS SPECIES (COAGULASE NEGATIVE)     Note: SUSCEPTIBILITIES PERFORMED ON PREVIOUS CULTURE WITHIN THE LAST 5 DAYS.     Note: Gram Stain Report Called to,Read Back By and Verified With: DARA ANDERSON ON 04/18/2014 AT 6:30P BY Dennard Nip     Performed at [facility]   Report Status 04/20/2014 FINAL   Final  CULTURE, BLOOD (ROUTINE X 2)     Status: None   Collection Time    04/17/14  9:40 AM      Result Value Ref Range Status   Specimen Description BLOOD LEFT HAND   Final   Special Requests BOTTLES DRAWN AEROBIC ONLY 2CC   Final   Culture  Setup Time     Final   Value: 04/17/2014 14:03     Performed at [facility]   Culture     Final   Value: STAPHYLOCOCCUS SPECIES (COAGULASE NEGATIVE)     Note: RIFAMPIN AND GENTAMICIN SHOULD NOT BE USED AS SINGLE DRUGS FOR TREATMENT  OF STAPH INFECTIONS.     Note: Gram Stain Report Called to,Read Back By and Verified With: DARA ANDERSON ON 04/18/2014 AT 6:30P BY WILEJ     Performed at [facility]   Report Status 04/20/2014 FINAL   Final   Organism ID, Bacteria STAPHYLOCOCCUS SPECIES (COAGULASE NEGATIVE)   Final  CULTURE, RESPIRATORY  (NON-EXPECTORATED)     Status: None   Collection Time    04/17/14 11:55 AM      Result Value Ref Range Status   Specimen Description TRACHEAL ASPIRATE   Final   Special Requests Normal   Final   Gram Stain     Final   Value: ABUNDANT WBC PRESENT, PREDOMINANTLY PMN     RARE SQUAMOUS EPITHELIAL CELLS PRESENT     NO ORGANISMS SEEN     Performed at [facility]   Culture     Final   Value: ABUNDANT STENOTROPHOMONAS MALTOPHILIA     Performed at [facility]   Report Status 04/21/2014 FINAL   Final   Organism ID, Bacteria STENOTROPHOMONAS MALTOPHILIA   Final  CULTURE, BLOOD (ROUTINE X 2)     Status: None   Collection Time    04/21/14  3:20 AM      Result Value Ref Range Status   Specimen Description BLOOD LEFT ARM   Final   Special Requests BOTTLES DRAWN AEROBIC ONLY 10CC   Final   Culture  Setup Time     Final   Value: 04/21/2014 11:23     Performed at [facility]   Culture     Final   Value: NO GROWTH 5 DAYS     Performed at [facility]   Report Status 04/27/2014 FINAL   Final  CULTURE, BLOOD (ROUTINE X 2)     Status: None   Collection Time    04/21/14  3:25 AM      Result Value Ref Range Status   Specimen Description BLOOD LEFT ARM   Final   Special Requests BOTTLES DRAWN AEROBIC ONLY 10CC   Final   Culture  Setup Time     Final   Value: 04/21/2014 11:23     Performed at [facility]   Culture     Final   Value: NO GROWTH 5 DAYS     Performed at [facility]   Report Status 04/27/2014 FINAL   Final    Anti-infectives   Start     Dose/Rate Route Frequency Ordered Stop   04/26/14 1800  vancomycin (VANCOCIN) IVPB 1000 mg/200 mL premix     1,000 mg 200 mL/hr over 60 Minutes Intravenous Every 18 hours 04/26/14 1704     04/23/14 1600  vancomycin (VANCOCIN) IVPB 1000 mg/200 mL premix  Status:  Discontinued     1,000 mg 200 mL/hr over 60 Minutes Intravenous Every 24 hours 04/22/14 1804 04/26/14 1704   04/19/14 1315   imipenem-cilastatin (PRIMAXIN) 500 mg in sodium chloride 0.9 % 100 mL IVPB  Status:  Discontinued     500 mg 200 mL/hr over 30 Minutes Intravenous 4 times per day 04/19/14 1311 04/23/14 1419   04/13/14 1730  vancomycin (VANCOCIN) IVPB 1000 mg/200 mL premix  Status:  Discontinued     1,000 mg 200 mL/hr over 60 Minutes Intravenous Every 12 hours 04/13/14 0847 04/22/14 1804   04/10/14 1730  vancomycin (VANCOCIN) IVPB 750 mg/150 ml premix  Status:  Discontinued     750 mg 150 mL/hr over 60 Minutes  Intravenous Every 12 hours 04/10/14 1112 04/10/14 1116   04/10/14 1730  vancomycin (VANCOCIN) IVPB 750 mg/150 ml premix  Status:  Discontinued     750 mg 150 mL/hr over 60 Minutes Intravenous Every 12 hours 04/10/14 1136 04/13/14 0847   04/08/14 2317  bacitracin 50,000 Units in sodium chloride irrigation 0.9 % 500 mL irrigation  Status:  Discontinued       As needed 04/08/14 2317 04/09/14 0049   04/08/14 1630  vancomycin (VANCOCIN) IVPB 750 mg/150 ml premix  Status:  Discontinued     750 mg 150 mL/hr over 60 Minutes Intravenous Every 12 hours 04/08/14 1538 04/10/14 1111   04/08/14 1000  levofloxacin (LEVAQUIN) tablet 500 mg  Status:  Discontinued     500 mg Oral Daily 04/07/14 1636 04/10/14 1116   04/07/14 1615  levofloxacin (LEVAQUIN) tablet 500 mg  Status:  Discontinued     500 mg Oral Daily 04/07/14 1611 04/07/14 1635      Assessment: Joanna Perez with paraplegia with CNS bacteremia- MDR Stenotroph - to continue vancomycin for 2 weeks using 5/30 as D#1, to stop on 6/13. Paraplegia so SCr not reflective of renal function- noted patient had SUPRAtherapeutic trough on 5/31, also noted a couple days of increased SCr prior to this level being drawn. A vancomycin trough drawn this afternoon results at 16.24mcg/mL- it was drawn about 1 hour early. Therapeutic, but shooting for a trough close to 65mcg/mL with MIC = 2.  Based on current dose of 1g IV q18h, patient is receiving 4g of vancomycin in a 72h period.    Goal of Therapy:  Vancomycin trough level 15-20 mcg/ml  Plan:  1. Change vancomycin to 750mg  IV q12h- in a 72h period this will provide 500mg  more than current dose. Decreasing dosing interval will also assist with increasing trough. Estimated trough with this dose is ~17mcg/mL. This new dose will start 6/8 at 0530 (12 hours after last dose) 2. Follow SCr closely for any changes as a small change could have a large effect in this patient 3. Vancomycin trough at new SS  Shardee Dieu D. Lea Baine, PharmD, BCPS Clinical Pharmacist Pager: [phone] 04/29/2014 6:25 PM

## 2014-04-30 ENCOUNTER — Inpatient Hospital Stay (HOSPITAL_COMMUNITY): Payer: Medicaid Other

## 2014-04-30 DIAGNOSIS — B9689 Other specified bacterial agents as the cause of diseases classified elsewhere: Secondary | ICD-10-CM

## 2014-04-30 DIAGNOSIS — Z1635 Resistance to multiple antimicrobial drugs: Secondary | ICD-10-CM

## 2014-04-30 DIAGNOSIS — IMO0002 Reserved for concepts with insufficient information to code with codable children: Secondary | ICD-10-CM

## 2014-04-30 LAB — GLUCOSE, CAPILLARY
GLUCOSE-CAPILLARY: 113 mg/dL — AB (ref 70–99)
GLUCOSE-CAPILLARY: 150 mg/dL — AB (ref 70–99)
GLUCOSE-CAPILLARY: 158 mg/dL — AB (ref 70–99)
GLUCOSE-CAPILLARY: 135 mg/dL — AB (ref 70–99)
Glucose-Capillary: 115 mg/dL — ABNORMAL HIGH (ref 70–99)
Glucose-Capillary: 177 mg/dL — ABNORMAL HIGH (ref 70–99)

## 2014-04-30 MED ORDER — FREE WATER
200.0000 mL | Freq: Three times a day (TID) | Status: DC
  Administered 2014-04-30 – 2014-05-02 (×6): 200 mL

## 2014-04-30 MED ORDER — LORAZEPAM 2 MG/ML IJ SOLN
2.0000 mg | INTRAMUSCULAR | Status: DC | PRN
  Administered 2014-05-10 – 2014-05-12 (×6): 2 mg via INTRAVENOUS
  Filled 2014-04-30 (×6): qty 1

## 2014-04-30 NOTE — Progress Notes (Signed)
PULMONARY / CRITICAL CARE MEDICINE  Name: Joanna Perez MRN: [id number] DOB: 11-23-1956    ADMISSION DATE:  04/07/2014 CONSULTATION DATE:  04/09/2014  REFERRING MD :  Dr. Annette Stable PRIMARY SERVICE: PCCM  CHIEF COMPLAINT:  Paraplegia   BRIEF PATIENT DESCRIPTION: 58 yo with COPD, CHF, DM, CAD s/p MI, atrial fibrillation transferred from [facility] for BLE paraplegia, found to have T6 fracture with spinal stenosis and spinal cord compression on MRI. She was taken for decompressive surgery and remained on mechanical ventilation post surgery.   SIGNIFICANT EVENTS / STUDIES:  5/16  Admitted to [facility], hypotensive and with new paraplegia 5/17  Transfer to [facility] 5/17  MR Cervical/Thoracic/Lumbar Spine - worsened T6 compression fracture with paraspinal and epidural hematoma causing cord compression and edema, possible early cord infarct 5/18  Intubation for procedure 5/18  Thoracic laminectomy 5/18  Extubated 5/19  Somnolent / hypercarbic >>> reintubated 5/26  Tracheostomy 5/28  TTE >>> Ef 65%, vegetation is not reported 6/4    G-tube placement  LINES / TUBES: ETT 5/18 >>> 5/18; 5/19 >>>5/26 R R AL 5/17 >>> out Foley 5/16 >>> R PICC 5/22 >>> 5/28 Trach 5/26 (df) >>> PEG 6/4 >>>  CULTURES: 5/16  Blood >>>  COAG NEG STAPH 5/16  Urine >>> neg 5/25 Sputum >  STENOTROPHOMONAS MALTOPHILIA 5/26  Sputum >>> STENOTROPHOMONAS MALTOPHILIA 5/26  Blood >>> COAG NEG STAPH 5/30  Blood  >>> neg  ANTIBIOTICS per ID: Vancomycin 5/17 >>> Levaquin 5/17 >>> 5/19 Imipenem 5/28 >>> 6/1  INTERVAL HISTORY: Doing well on CPAP/PS but not on trach collar.  Feels tired today.  VITAL SIGNS: Temp:  [97.9 F (36.6 C)-98.6 F (37 C)] 98.3 F (36.8 C) (06/08 0757) Pulse Rate:  [67-83] 77 (06/08 0757) Resp:  [14-22] 19 (06/08 0757) BP: (88-126)/(42-72) 110/56 mmHg (06/08 0700) SpO2:  [93 %-100 %] 96 % (06/08 0859) FiO2 (%):  [40 %] 40 % (06/08 0900)  HEMODYNAMICS: CVP:  [9 mmHg] 9  mmHg  VENTILATOR SETTINGS: Vent Mode:  [-] PSV;CPAP FiO2 (%):  [40 %] 40 % Set Rate:  [20 bmp] 20 bmp Vt Set:  [400 mL] 400 mL PEEP:  [5 cmH20] 5 cmH20 Pressure Support:  [10 cmH20-12 cmH20] 12 cmH20 Plateau Pressure:  [24 cmH20-25 cmH20] 24 cmH20  INTAKE / OUTPUT: Intake/Output     06/07 0701 - 06/08 0700 06/08 0701 - 06/09 0700   I.V. (mL/kg) 190 (2) 20 (0.2)   NG/GT 1150 100   IV Piggyback     Total Intake(mL/kg) 1340 (14.2) 120 (1.3)   Urine (mL/kg/hr) 650 (0.3) 150 (0.4)   Total Output 650 150   Net +690 -30          PHYSICAL EXAMINATION: General:  No distress, tolerating  Neuro:  Sleepy but arouses to stimulation,  HEENT:  Tracheostomy site without bleeding / drainage Cardiovascular:  Regular, no murmurs Lungs:  Diminished bilateral air entry, few expiratory wheezes Abdomen:  Obese, soft, reducible ventral hernia Musculoskeletal:  No edema Skin:  No rash  LABS: CBC  Recent Labs Lab 04/25/14 0350 04/26/14 0300 04/27/14 0230  WBC 10.8* 10.9* 7.8  HGB 8.1* 8.3* 8.6*  HCT 27.1* 28.1* 27.7*  PLT 550* 481* 476*   Coag's No results found for this basename: APTT, INR,  in the last 168 hours BMET  Recent Labs Lab 04/25/14 0350 04/27/14 0230 04/29/14 0620  NA 142 142 139  K 4.1 3.9 4.4  CL 96 93* 94*  CO2 43* 42*  41*  BUN 22 18 21   CREATININE 0.33* 0.37* 0.34*  GLUCOSE 143* 76 139*   Electrolytes  Recent Labs Lab 04/25/14 0350 04/27/14 0230 04/29/14 0620  CALCIUM 8.7 9.0 8.6   Sepsis Markers No results found for this basename: LATICACIDVEN, PROCALCITON, O2SATVEN,  in the last 168 hours ABG No results found for this basename: PHART, PCO2ART, PO2ART,  in the last 168 hours Liver Enzymes  Recent Labs Lab 04/27/14 0230  AST 20  ALT 14  ALKPHOS 81  BILITOT <0.2*  ALBUMIN 1.8*   Cardiac Enzymes No results found for this basename: TROPONINI, PROBNP,  in the last 168 hours Glucose  Recent Labs Lab 04/29/14 1137 04/29/14 1615  04/29/14 2100 04/30/14 0109 04/30/14 0510 04/30/14 0756  GLUCAP 154* 157* 120* 113* 115* 150*    IMAGING: Dg Chest Port 1 View  04/30/2014   CLINICAL DATA:  Congestion  EXAM: PORTABLE CHEST - 1 VIEW  COMPARISON:  04/25/2014  FINDINGS: Cardiac shadow is stable. A left jugular central venous line is again noted in the mid superior vena cava. Tracheostomy tube is again seen and stable. The lungs are well aerated bilaterally. No focal infiltrate or sizable effusion is seen. No bony abnormality is noted.  IMPRESSION: No acute abnormality noted.   Electronically Signed   By: Inez Catalina M.D.   On: 04/30/2014 07:37   ASSESSMENT / PLAN:  PULMONARY A: Acute on chronic respiratory failure Respiratory muscle weakness secondary to cord compression / new T6 paraplegia  COPD with possible exacerbation Suspect OSA / OHS Tracheostomy status P:    Goal SpO2>92 Wean CPAP / PS as tolerated Likely needs chronic vent support DuoNeb / Albuterol / Pulmicort Prednisone 10 (chronic, preadmission ) SLP  CARDIOVASCULAR A:  Chronic diastolic heart failure P:  No intervention required  RENAL A:   Euvolemia P:   Trend BMP D/c Lasix Decrease free water to q8h  GASTROINTESTINAL A:   Nutrition GERD P:   TF Protonix as preadmission  HEMATOLOGIC A:   Anemia Thrombocytosis VTE Px P:  Trend CBC SCD  INFECTIOUS A:   Bacteremia with COAG NEG STAPH 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, 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 Endocarditis unlikely, negative TTE Allergy PCN / cephalosporins P:  Vancomycin through June 13 per ID  ENDOCRINE  A:   Hyperglycemia Hypothyroidism P:   SSI Synthroid ( increased earlier ) TSH check 7/2  NEUROLOGIC A:   T6 cord compression / paraplegia, s/p spinal cord decompression, still no LE movement Delirium, resolved Anxiety P:   Vicodin / Dilauded D/c Versed as short acting Start Ativan PRN Celexa, Xanax as preadmission PT  I have personally obtained  history, examined patient, evaluated and interpreted laboratory and imaging results, reviewed medical records, formulated assessment / plan and placed orders.  Doree Fudge, MD Pulmonary and [place] Pager: [phone]  04/30/2014, 10:55 AM

## 2014-04-30 NOTE — Progress Notes (Signed)
Trach consult follow up: pt remains on vent, per CSW notes, plan remains for pt to be discharged to vent SNF, will follow back in 1 week to evaluate for any changes

## 2014-05-01 LAB — GLUCOSE, CAPILLARY
GLUCOSE-CAPILLARY: 183 mg/dL — AB (ref 70–99)
GLUCOSE-CAPILLARY: 158 mg/dL — AB (ref 70–99)
Glucose-Capillary: 112 mg/dL — ABNORMAL HIGH (ref 70–99)
Glucose-Capillary: 116 mg/dL — ABNORMAL HIGH (ref 70–99)
Glucose-Capillary: 161 mg/dL — ABNORMAL HIGH (ref 70–99)
Glucose-Capillary: 191 mg/dL — ABNORMAL HIGH (ref 70–99)

## 2014-05-01 NOTE — Progress Notes (Addendum)
NUTRITION FOLLOW UP  DOCUMENTATION CODES Per approved criteria  -Obesity Unspecified   INTERVENTION: Continue current TF regimen RD to follow for nutrition care plan  NUTRITION DIAGNOSIS: Inadequate oral intake related to inability to eat as evidenced by NPO status, ongoing  Goal: Enteral nutrition to provide 60-70% of estimated calorie needs (22-25 kcals/kg ideal body weight) and 100% of estimated protein needs, based on ASPEN guidelines for permissive underfeeding in critically ill obese individuals, met  Monitor:  TF regimen & tolerance, respiratory status, weight, labs, I/O's  ASSESSMENT: Pt admitted with altered mental status and COPD exacerbation. Pt is status post thoracic decompressive surgery for severe myelopathy with subacute paraplegia on 5/18. Per MD note prognosis for neurological recovery is very poor.   Patient transferred to 2C-Stepdown from 109M-Neuro ICU 5/27.  Patient is currently on ventilator support via trach MV: 9.9 L/min Temp (24hrs), Avg:98.2 F (36.8 C), Min:97.7 F (36.5 C), Max:98.6 F (37 C)   Patient s/p procedure 6/4: IR GASTROSTOMY TUBE    Vital HP formula continues to infuse at 50 ml/hr via G-tube providing 1200 kcal (24 kcal/kg IBW), 105 grams of protein (100% of estimated protein needs) and 1003 ml of water.  Free water flushes at 200 ml every 8 hours.  Disposition: vent SNF.  Height: Ht Readings from Last 1 Encounters:  04/09/14 _0  (1.575 m)    Weight -----> fluctuating but stable Wt Readings from Last 1 Encounters:  05/01/14 209 lb 10.5 oz (95.1 kg)    6/07  208 lb 6/06  205 lb 6/05  208 lb 6/04  200 lb 6/03  202 lb 6/02  207 lb 6/01  210 lb 5/31  210 lb 5/30  217 lb 5/29  224 lb 5/28  217 lb 5/27  211 lb  BMI:  Body mass index is 38.34 kg/(m^2).  Re-estimated needs: Kcal: 1703 Protein: 100-110 gm Fluid: per MD   Skin: stage II pressure ulcer on sacrum  Diet Order: NPO   Intake/Output Summary (Last 24  hours) at 05/01/14 1223 Last data filed at 05/01/14 1100  Gross per 24 hour  Intake   1930 ml  Output   3325 ml  Net  -1395 ml    Labs:   Recent Labs Lab 04/25/14 0350 04/27/14 0230 04/29/14 0620  NA 142 142 139  K 4.1 3.9 4.4  CL 96 93* 94*  CO2 43* 42* 41*  BUN _1 CREATININE 0.33* 0.37* 0.34*  CALCIUM 8.7 9.0 8.6  GLUCOSE 143* 76 139*    CBG (last 3)   Recent Labs  04/30/14 2353 05/01/14 0406 05/01/14 0754  GLUCAP 112* 116* 161*    Scheduled Meds: . ALPRAZolam  1 mg Oral TID  . antiseptic oral rinse  15 mL Mouth Rinse QID  . budesonide (PULMICORT) nebulizer solution  0.5 mg Nebulization BID  . chlorhexidine  15 mL Mouth Rinse BID  . cholecalciferol  400 Units Oral Daily  . citalopram  20 mg Oral Daily  . feeding supplement (VITAL HIGH PROTEIN)  1,000 mL Per Tube Q24H  . free water  200 mL Per Tube 3 times per day  . insulin aspart  0-15 Units Subcutaneous 6 times per day  . ipratropium-albuterol  3 mL Nebulization BID  . levothyroxine  137 mcg Oral QAC breakfast  . pantoprazole sodium  40 mg Per Tube Daily  . potassium chloride  20 mEq Per Tube Daily  . predniSONE  10 mg Oral Q breakfast  .  senna  1 tablet Oral BID  . sodium chloride  10-40 mL Intracatheter Q12H  . vancomycin  750 mg Intravenous Q12H    Continuous Infusions: . dextrose 10 mL/hr at 05/01/14 1142   Arthur Holms, RD, LDN Pager #: [phone] After-Hours Pager #: [phone]

## 2014-05-01 NOTE — Progress Notes (Addendum)
Meeting between pt's husband and vent SNF Medicaid rep scheduled for tomorrow at 1pm. Goal is to have pt placed at vent SNF after this is completed.  Addendum: Rep with vent SNF in [place] states they currently do not have a bed available, and are attempting to re-arrange beds now to accommodate pt. Medicaid application must be reviewed by corporate office before they will attempt to arrange a room for pt. Discharge likely in a few days at the earliest.  Ky Barban, MSW, [place] Social Worker [phone]

## 2014-05-01 NOTE — Progress Notes (Signed)
PULMONARY / CRITICAL CARE MEDICINE  Name: Joanna Perez MRN: [id number] DOB: 1956-11-23    ADMISSION DATE:  04/07/2014 CONSULTATION DATE:  04/09/2014  REFERRING MD :  Dr. Annette Stable PRIMARY SERVICE: PCCM  CHIEF COMPLAINT:  Paraplegia   BRIEF PATIENT DESCRIPTION: 58 yo with COPD, CHF, DM, CAD s/p MI, atrial fibrillation transferred from [facility] for BLE paraplegia, found to have T6 fracture with spinal stenosis and spinal cord compression on MRI. She was taken for decompressive surgery and remained on mechanical ventilation post surgery.   SIGNIFICANT EVENTS / STUDIES:  5/16  Admitted to [facility], hypotensive and with new paraplegia 5/17  Transfer to [facility] 5/17  MR Cervical/Thoracic/Lumbar Spine - worsened T6 compression fracture with paraspinal and epidural hematoma causing cord compression and edema, possible early cord infarct 5/18  Intubation for procedure 5/18  Thoracic laminectomy 5/18  Extubated 5/19  Somnolent / hypercarbic >>> reintubated 5/26  Tracheostomy 5/28  TTE >>> Ef 65%, vegetation is not reported 6/4    G-tube placement  LINES / TUBES: ETT 5/18 >>> 5/18; 5/19 >>>5/26 R R AL 5/17 >>> out Foley 5/16 >>> R PICC 5/22 >>> 5/28 Trach 5/26 (df) >>> PEG 6/4 >>>  CULTURES: 5/16  Blood >>>  COAG NEG STAPH 5/16  Urine >>> neg 5/25 Sputum >  STENOTROPHOMONAS MALTOPHILIA 5/26  Sputum >>> STENOTROPHOMONAS MALTOPHILIA 5/26  Blood >>> COAG NEG STAPH 5/30  Blood  >>> neg  ANTIBIOTICS per ID: Vancomycin 5/17 >>> Levaquin 5/17 >>> 5/19 Imipenem 5/28 >>> 6/1  INTERVAL HISTORY: Doing well on CPAP/PS but not on trach collar.  Feels tired today.  VITAL SIGNS: Temp:  [97.7 F (36.5 C)-99.3 F (37.4 C)] 98.1 F (36.7 C) (06/09 0750) Pulse Rate:  [67-93] 93 (06/09 0905) Resp:  [0-22] 22 (06/09 0905) BP: (95-120)/(48-59) 120/52 mmHg (06/09 0905) SpO2:  [92 %-97 %] 97 % (06/09 0905) FiO2 (%):  [40 %] 40 % (06/09 0905) Weight:  [95.1 kg (209 lb 10.5 oz)] 95.1 kg  (209 lb 10.5 oz) (06/09 0426)  HEMODYNAMICS: CVP:  [8 mmHg] 8 mmHg  VENTILATOR SETTINGS: Vent Mode:  [-] PRVC FiO2 (%):  [40 %] 40 % Set Rate:  [20 bmp] 20 bmp Vt Set:  [400 mL] 400 mL PEEP:  [5 cmH20] 5 cmH20 Plateau Pressure:  [18 cmH20-22 cmH20] 19 cmH20  INTAKE / OUTPUT: Intake/Output     06/08 0701 - 06/09 0700 06/09 0701 - 06/10 0700   I.V. (mL/kg) 240 (2.5) 40 (0.4)   NG/GT 1600 200   IV Piggyback 150    Total Intake(mL/kg) 1990 (20.9) 240 (2.5)   Urine (mL/kg/hr) 3475 (1.5)    Total Output 3475     Net -1485 +240          PHYSICAL EXAMINATION: General:  Resting in no distress Neuro:  Awake, alert, attempting to communicate HEENT:  Tracheostomy site intact Cardiovascular:  Regular, no murmurs Lungs:  Diminished bilateral air entry, no added sounds Abdomen:  Obese, soft, reducible ventral hernia Musculoskeletal:  No edema Skin:  No rash  LABS: CBC  Recent Labs Lab 04/25/14 0350 04/26/14 0300 04/27/14 0230  WBC 10.8* 10.9* 7.8  HGB 8.1* 8.3* 8.6*  HCT 27.1* 28.1* 27.7*  PLT 550* 481* 476*   Coag's No results found for this basename: APTT, INR,  in the last 168 hours BMET  Recent Labs Lab 04/25/14 0350 04/27/14 0230 04/29/14 0620  NA 142 142 139  K 4.1 3.9 4.4  CL 96 93* 94*  CO2 43* 42* 41*  BUN 22 18 21   CREATININE 0.33* 0.37* 0.34*  GLUCOSE 143* 76 139*   Electrolytes  Recent Labs Lab 04/25/14 0350 04/27/14 0230 04/29/14 0620  CALCIUM 8.7 9.0 8.6   Sepsis Markers No results found for this basename: LATICACIDVEN, PROCALCITON, O2SATVEN,  in the last 168 hours ABG No results found for this basename: PHART, PCO2ART, PO2ART,  in the last 168 hours Liver Enzymes  Recent Labs Lab 04/27/14 0230  AST 20  ALT 14  ALKPHOS 81  BILITOT <0.2*  ALBUMIN 1.8*   Cardiac Enzymes No results found for this basename: TROPONINI, PROBNP,  in the last 168 hours Glucose  Recent Labs Lab 04/30/14 1201 04/30/14 1554 04/30/14 2010  04/30/14 2353 05/01/14 0406 05/01/14 0754  GLUCAP 177* 158* 135* 112* 116* 161*    IMAGING: Dg Chest Port 1 View  04/30/2014   CLINICAL DATA:  Congestion  EXAM: PORTABLE CHEST - 1 VIEW  COMPARISON:  04/25/2014  FINDINGS: Cardiac shadow is stable. A left jugular central venous line is again noted in the mid superior vena cava. Tracheostomy tube is again seen and stable. The lungs are well aerated bilaterally. No focal infiltrate or sizable effusion is seen. No bony abnormality is noted.  IMPRESSION: No acute abnormality noted.   Electronically Signed   By: Inez Catalina M.D.   On: 04/30/2014 07:37   ASSESSMENT / PLAN:  PULMONARY A: Acute on chronic respiratory failure Respiratory muscle weakness secondary to cord compression / new T6 paraplegia  COPD with possible exacerbation Suspect OSA / OHS Tracheostomy status P:    Goal SpO2>92 Wean on CPAP / PS as tolerated Awaiting chronic vent facility placement DuoNeb / Albuterol / Pulmicort Prednisone 10 (chronic, preadmission ) SLP  CARDIOVASCULAR A:  Chronic diastolic heart failure P:  No intervention required  RENAL A:   Euvolemia P:   Trend BMP Free water to q8h  GASTROINTESTINAL A:   Nutrition GERD P:   TF Protonix as preadmission  HEMATOLOGIC A:   Anemia Thrombocytosis VTE Px P:  Trend CBC SCD  INFECTIOUS A:   Bacteremia with COAG NEG STAPH 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, 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 Endocarditis unlikely, negative TTE Allergy PCN / cephalosporins P:  Vancomycin through June 13 per ID  ENDOCRINE  A:   Hyperglycemia Hypothyroidism P:   SSI Synthroid ( increased earlier ) TSH check 7/2  NEUROLOGIC A:   T6 cord compression / paraplegia, s/p spinal cord decompression, still no LE movement Delirium, resolved Anxiety P:   Vicodin / Dilauded Ativan PRN Celexa, Xanax as preadmission PT  I have personally obtained history, examined patient, evaluated and interpreted laboratory  and imaging results, reviewed medical records, formulated assessment / plan and placed orders.  Doree Fudge, MD Pulmonary and [place] Pager: [phone]  05/01/2014, 11:41 AM

## 2014-05-01 NOTE — Progress Notes (Signed)
Physical Therapy Treatment Patient Details Name: Joanna Perez MRN: [id number] DOB: [birth year] Today's Date: 05/01/2014    History of Present Illness 58 yo  transferred from [facility] (adm s/p fall) for BLE paraplegia, found to have T6 fracture with spinal stenosis and spinal cord compression on MRI. She was taken for decompressive surgery (T5-8) and remained on mechanical ventilation post surgery. Pt now s/p trach and has been unable to wean off vent. PMHx- with COPD, CHF, DM, CAD s/p MI, atrial fibrillation; Pt on home 4L 02.    PT Comments    Pt reporting more back pain (at level of incision) today; no improvement after activity and repositioned; RN notified pt requesting medication to assist with pain control   Follow Up Recommendations  SNF;Supervision/Assistance - 24 hour     Equipment Recommendations  None recommended by PT (TBA at [facility])    Recommendations for Other Services OT consult     Precautions / Restrictions Precautions Precautions: Fall    Mobility  Bed Mobility Overal bed mobility: +2 for physical assistance;Needs Assistance Bed Mobility: Rolling;Sit to Sidelying;Supine to Sit Rolling: Total assist;+2 for physical assistance   Supine to sit: Max assist;+2 for physical assistance;+2 for safety/equipment;HOB elevated   Sit to sidelying: Max assist;+2 for physical assistance;+2 for safety/equipment General bed mobility comments: HOB incr in 10 degree increments from 25 to 45 degrees with BP stable and no orthostasis; pt assisted to pivot to sit EOB from [facility] elevated. Pt using head and arms appropriately to assist with movement; on return to sidelying, pt using UEs to assist approp; rolling Rt with max +1, Lt total +2  Transfers                    Ambulation/Gait                 Stairs            Wheelchair Mobility    Modified Rankin (Stroke Patients Only)       Balance     Sitting balance-Leahy Scale: Poor Sitting balance -  Comments: pt tolerated EOB x 3 minutes with bil UE behind her to assist with prop sitting and trunk extension; pt able to reach forward for PT's shoulders maintian this position with min assist at trunk for balance x 1 minute                            Cognition Arousal/Alertness: Awake/alert Behavior During Therapy: Anxious Overall Cognitive Status: Difficult to assess                      Exercises General Exercises - Upper Extremity Shoulder Flexion:  (pt reported too painful on her back today) Digit Composite Flexion: Both;20 reps (hand pumps as elevating HOB)    General Comments General comments (skin integrity, edema, etc.): pt requesting to be suctioned and RT in to do this      Pertinent Vitals/Pain SaO2 94-97% on PRVC with FiO2 40% PEEP 5, RR 20-24 BP 120s/50s throughout--reported mild dizziness on turn to sit EOB despite keeping her PAS hose on legs running entire session    Home Living                      Prior Function            PT Goals (current goals can now be found in the care plan section)  Acute Rehab PT Goals Patient Stated Goal: agrees to goals of incr strength Progress towards PT goals: Progressing toward goals    Frequency  Min 2X/week    PT Plan Current plan remains appropriate    Co-evaluation             End of Session Equipment Utilized During Treatment: Oxygen (on rest mode on vent) Activity Tolerance: Patient limited by fatigue;Patient limited by pain Patient left: in bed;with call bell/phone within reach     Time: 2297-9892 PT Time Calculation (min): 44 min  Charges:  $Therapeutic Activity: 38-52 mins                    G CodesJeanie Cooks [place] Perez 05-25-14, 9:44 AM Pager [phone]

## 2014-05-02 LAB — BASIC METABOLIC PANEL WITH GFR
BUN: 20 mg/dL (ref 6–23)
CO2: 27 meq/L (ref 19–32)
Calcium: 5.7 mg/dL — CL (ref 8.4–10.5)
Chloride: 110 meq/L (ref 96–112)
Creatinine, Ser: 0.23 mg/dL — ABNORMAL LOW (ref 0.50–1.10)
GFR calc Af Amer: >90 mL/min
GFR calc non Af Amer: >90 mL/min
Glucose, Bld: 98 mg/dL (ref 70–99)
Potassium: 2.9 meq/L — CL (ref 3.7–5.3)
Sodium: 148 meq/L — ABNORMAL HIGH (ref 137–147)

## 2014-05-02 LAB — GLUCOSE, CAPILLARY
GLUCOSE-CAPILLARY: 139 mg/dL — AB (ref 70–99)
GLUCOSE-CAPILLARY: 146 mg/dL — AB (ref 70–99)
Glucose-Capillary: 116 mg/dL — ABNORMAL HIGH (ref 70–99)
Glucose-Capillary: 125 mg/dL — ABNORMAL HIGH (ref 70–99)
Glucose-Capillary: 128 mg/dL — ABNORMAL HIGH (ref 70–99)
Glucose-Capillary: 153 mg/dL — ABNORMAL HIGH (ref 70–99)

## 2014-05-02 LAB — CBC WITH DIFFERENTIAL/PLATELET
BASOS ABS: 0 10*3/uL (ref 0.0–0.1)
Basophils Relative: 0 % (ref 0–1)
Eosinophils Absolute: 0 10*3/uL (ref 0.0–0.7)
Eosinophils Relative: 0 % (ref 0–5)
HCT: 31.8 % — ABNORMAL LOW (ref 36.0–46.0)
Hemoglobin: 9.5 g/dL — ABNORMAL LOW (ref 12.0–15.0)
LYMPHS PCT: 7 % — AB (ref 12–46)
Lymphs Abs: 0.8 10*3/uL (ref 0.7–4.0)
MCH: 25.8 pg — ABNORMAL LOW (ref 26.0–34.0)
MCHC: 29.9 g/dL — AB (ref 30.0–36.0)
MCV: 86.4 fL (ref 78.0–100.0)
Monocytes Absolute: 0.6 10*3/uL (ref 0.1–1.0)
Monocytes Relative: 5 % (ref 3–12)
NEUTROS ABS: 9.7 10*3/uL — AB (ref 1.7–7.7)
NEUTROS PCT: 87 % — AB (ref 43–77)
PLATELETS: 327 10*3/uL (ref 150–400)
RBC: 3.68 MIL/uL — ABNORMAL LOW (ref 3.87–5.11)
RDW: 19 % — AB (ref 11.5–15.5)
WBC: 11.2 10*3/uL — AB (ref 4.0–10.5)

## 2014-05-02 LAB — CBC
HCT: 18.2 % — ABNORMAL LOW (ref 36.0–46.0)
Hemoglobin: 5.4 g/dL — CL (ref 12.0–15.0)
MCH: 25.6 pg — ABNORMAL LOW (ref 26.0–34.0)
MCHC: 29.7 g/dL — ABNORMAL LOW (ref 30.0–36.0)
MCV: 86.3 fL (ref 78.0–100.0)
Platelets: 251 K/uL (ref 150–400)
RBC: 2.11 MIL/uL — ABNORMAL LOW (ref 3.87–5.11)
RDW: 20.3 % — ABNORMAL HIGH (ref 11.5–15.5)
WBC: 7.3 K/uL (ref 4.0–10.5)

## 2014-05-02 LAB — PREPARE RBC (CROSSMATCH)

## 2014-05-02 LAB — VANCOMYCIN, TROUGH: Vancomycin Tr: 22.5 ug/mL — ABNORMAL HIGH (ref 10.0–20.0)

## 2014-05-02 MED ORDER — FUROSEMIDE 10 MG/ML IJ SOLN
INTRAMUSCULAR | Status: AC
  Filled 2014-05-02: qty 4

## 2014-05-02 MED ORDER — POTASSIUM CHLORIDE 10 MEQ/50ML IV SOLN
10.0000 meq | INTRAVENOUS | Status: AC
  Administered 2014-05-02 (×6): 10 meq via INTRAVENOUS
  Filled 2014-05-02 (×6): qty 50

## 2014-05-02 MED ORDER — SODIUM CHLORIDE 0.9 % IV SOLN
1.0000 g | Freq: Once | INTRAVENOUS | Status: AC
  Administered 2014-05-02: 1 g via INTRAVENOUS
  Filled 2014-05-02: qty 10

## 2014-05-02 MED ORDER — FREE WATER
200.0000 mL | Freq: Four times a day (QID) | Status: DC
  Administered 2014-05-02 – 2014-05-11 (×33): 200 mL

## 2014-05-02 MED ORDER — FUROSEMIDE 10 MG/ML IJ SOLN
20.0000 mg | Freq: Once | INTRAMUSCULAR | Status: AC
  Administered 2014-05-02: 20 mg via INTRAVENOUS
  Filled 2014-05-02: qty 2

## 2014-05-02 NOTE — Progress Notes (Signed)
OT Cancellation Note  Patient Details Name: Joanna Perez MRN: [id number] DOB: 02-Jun-1956   Cancelled Treatment:    Reason Eval/Treat Not Completed: Medical issues which prohibited therapy (hgb 5.4, K+ 2.9) OT spoke directly with RN and holding evaluation this AM due to medical issues. OT to check back at time allows and when pt most appropriate.  Peri Maris Pager: [phone]  05/02/2014, 9:16 AM

## 2014-05-02 NOTE — Progress Notes (Signed)
CRITICAL VALUE ALERT  Critical value received:  K 2.9, Ca 5.7  Date of notification:  6/10  Time of notification:  0600  Critical value read back:yes  Nurse who received alert:  Karma Ganja  MD notified (1st page):  PCCM  Time of first page:  0610  MD notified (2nd page):  Time of second page:  Responding MD:  PCCM  Time MD responded:  [phone]

## 2014-05-02 NOTE — Progress Notes (Signed)
eLink Physician-Brief Progress Note Patient Name: Joanna Perez DOB: Oct 11, 1956 MRN: [id number]  Date of Service  05/02/2014   HPI/Events of Note  Hgb of 5.4   eICU Interventions  Plan: Transfuse 2 units of pRBC Lasix 20 mg IV between units Post-transfusion CBC   Intervention Category Intermediate Interventions: Bleeding - evaluation and treatment with blood products  Tonnette Zwiebel 05/02/2014, 6:19 AM

## 2014-05-02 NOTE — Progress Notes (Signed)
eLink Physician-Brief Progress Note Patient Name: Joanna Perez DOB: 1956-07-09 MRN: [id number]  Date of Service  05/02/2014   HPI/Events of Note  Hypokalemia and hypocalcemia   eICU Interventions  Potassium and calcium replaced   Intervention Category Intermediate Interventions: Electrolyte abnormality - evaluation and management  Khylei Wilms 05/02/2014, 6:11 AM

## 2014-05-02 NOTE — Progress Notes (Signed)
PULMONARY / CRITICAL CARE MEDICINE  Name: Joanna Perez MRN: [id number] DOB: [birth year]    ADMISSION DATE:  04/07/2014 CONSULTATION DATE:  04/09/2014  REFERRING MD :  Dr. Annette Stable PRIMARY SERVICE: PCCM  CHIEF COMPLAINT:  Paraplegia   BRIEF PATIENT DESCRIPTION: 58 yo with COPD, CHF, DM, CAD s/p MI, atrial fibrillation transferred from [facility] for BLE paraplegia, found to have T6 fracture with spinal stenosis and spinal cord compression on MRI. She was taken for decompressive surgery and remained on mechanical ventilation post surgery.   SIGNIFICANT EVENTS / STUDIES:  5/16  Admitted to [facility], hypotensive and with new paraplegia 5/17  Transfer to [facility] 5/17  MR Cervical/Thoracic/Lumbar Spine - worsened T6 compression fracture with paraspinal and epidural hematoma causing cord compression and edema, possible early cord infarct 5/18  Intubation for procedure 5/18  Thoracic laminectomy 5/18  Extubated 5/19  Somnolent / hypercarbic >>> reintubated 5/26  Tracheostomy 5/28  TTE >>> Ef 65%, vegetation is not reported 6/4    G-tube placement 6/10  Transfused PRBC  LINES / TUBES: ETT 5/18 >>> 5/18; 5/19 >>>5/26 R R AL 5/17 >>> out Foley 5/16 >>> R PICC 5/22 >>> 5/28 Trach 5/26 (df) >>> PEG 6/4 >>>  CULTURES: 5/16  Blood >>>  COAG NEG STAPH 5/16  Urine >>> neg 5/25 Sputum >  STENOTROPHOMONAS MALTOPHILIA 5/26  Sputum >>> STENOTROPHOMONAS MALTOPHILIA 5/26  Blood >>> COAG NEG STAPH 5/30  Blood  >>> neg  ANTIBIOTICS per ID: Vancomycin 5/17 >>> Levaquin 5/17 >>> 5/19 Imipenem 5/28 >>> 6/1  INTERVAL HISTORY: Transfused.  VITAL SIGNS: Temp:  [98.2 F (36.8 C)-98.7 F (37.1 C)] 98.7 F (37.1 C) (06/10 1000) Pulse Rate:  [68-88] 79 (06/10 1203) Resp:  [18-24] 22 (06/10 1100) BP: (102-129)/(51-92) 114/53 mmHg (06/10 1203) SpO2:  [95 %-99 %] 96 % (06/10 1203) FiO2 (%):  [30 %-40 %] 30 % (06/10 1203) Weight:  [97.5 kg (214 lb 15.2 oz)] 97.5 kg (214 lb 15.2 oz) (06/10  0403)  HEMODYNAMICS: CVP:  [12 mmHg-13 mmHg] 13 mmHg  VENTILATOR SETTINGS: Vent Mode:  [-] PRVC FiO2 (%):  [30 %-40 %] 30 % Set Rate:  [20 bmp] 20 bmp Vt Set:  [400 mL] 400 mL PEEP:  [5 cmH20] 5 cmH20 Plateau Pressure:  [20 cmH20-25 cmH20] 25 cmH20  INTAKE / OUTPUT: Intake/Output     06/09 0701 - 06/10 0700 06/10 0701 - 06/11 0700   I.V. (mL/kg) 240 (2.5) 30 (0.3)   Blood  330   NG/GT 1600 150   IV Piggyback 450 100   Total Intake(mL/kg) 2290 (23.5) 610 (6.3)   Urine (mL/kg/hr) 1300 (0.6) 500 (1)   Total Output 1300 500   Net +990 +110          PHYSICAL EXAMINATION: General:  No distress Neuro:  Asleep HEENT:  Tracheostomy site intact Cardiovascular:  Regular, no murmurs Lungs:  Scatted rhonchi, no wheezes  Abdomen:  Obese, soft, bowel sounds present Musculoskeletal:  No edema Skin:  No rash  LABS: CBC  Recent Labs Lab 04/26/14 0300 04/27/14 0230 05/02/14 0430  WBC 10.9* 7.8 7.3  HGB 8.3* 8.6* 5.4*  HCT 28.1* 27.7* 18.2*  PLT 481* 476* 251   Coag's No results found for this basename: APTT, INR,  in the last 168 hours  BMET  Recent Labs Lab 04/27/14 0230 04/29/14 0620 05/02/14 0430  NA 142 139 148*  K 3.9 4.4 2.9*  CL 93* 94* 110  CO2 42* 41* 27  BUN  18 21 20   CREATININE 0.37* 0.34* 0.23*  GLUCOSE 76 139* 98   Electrolytes  Recent Labs Lab 04/27/14 0230 04/29/14 0620 05/02/14 0430  CALCIUM 9.0 8.6 5.7*   Sepsis Markers No results found for this basename: LATICACIDVEN, PROCALCITON, O2SATVEN,  in the last 168 hours ABG No results found for this basename: PHART, PCO2ART, PO2ART,  in the last 168 hours Liver Enzymes  Recent Labs Lab 04/27/14 0230  AST 20  ALT 14  ALKPHOS 81  BILITOT <0.2*  ALBUMIN 1.8*   Cardiac Enzymes No results found for this basename: TROPONINI, PROBNP,  in the last 168 hours Glucose  Recent Labs Lab 05/01/14 1525 05/01/14 2012 05/01/14 2338 05/02/14 0446 05/02/14 0739 05/02/14 1113  GLUCAP 191*  158* 125* 116* 139* 146*    IMAGING: No results found. ASSESSMENT / PLAN:  PULMONARY A: Acute on chronic respiratory failure Respiratory muscle weakness secondary to cord compression / new T6 paraplegia  COPD with possible exacerbation Suspect OSA / OHS Tracheostomy status P:    Goal SpO2>92 Wean on CPAP / PS as tolerated Awaiting chronic vent facility placement DuoNeb / Albuterol / Pulmicort Prednisone 10 (chronic, preadmission ) SLP  CARDIOVASCULAR A:  Chronic diastolic heart failure P:  No intervention required   RENAL A:   Hypernatremia Hypokalemia Hypocalcemia  P:   Trend BMP Free water change to q6h K 10 x 6 Ca 1  GASTROINTESTINAL A:   Nutrition GERD P:   TF Protonix as preadmission  HEMATOLOGIC A:   Anemia, no overt hemorrhage VTE Px P:  Ordered 2 units PRBC by eMD Trend CBC, repeat after transfusion SCD  INFECTIOUS A:   Bacteremia with COAG NEG STAPH 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, 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  Doree Fudge, MD Pulmonary and [place] Pager: [phone]  05/02/2014, 12:18 PM

## 2014-05-02 NOTE — Progress Notes (Signed)
CRITICAL VALUE ALERT  Critical value received:  Hgb 5.4  Date of notification:  6/10  Time of notification:  0615  Critical value read back:yes  Nurse who received alert:  Karma Ganja  MD notified (1st page):  PCCM  Time of first page:  0617  MD notified (2nd page):  Time of second page:  Responding MD:  PCCM  Time MD responded:  [phone]

## 2014-05-02 NOTE — Progress Notes (Addendum)
CSW updated pt on status of vent SNF search--currently pursuing bed at [facility] in [place] as [facility] are full and do not anticipate openings for ~3 weeks. Medicaid rep with facility to contact pt's husband to do the Medicaid application today. Pt understanding of vent SNF search status.  Addendum: [facility] in [place] has a private female vent SNF bed--sent this facility a referral to determine if pt is clinically appropriate for this bed. This facility requires her Medicaid to be switched to [facility], which is being completed today--facility to follow up with CSW after reviewing clinicals, and CSW will update pt's husband if this facility could be an option after Medicaid is switched.  Ky Barban, MSW, [facility] Clinical Social Worker [phone]

## 2014-05-02 NOTE — Progress Notes (Signed)
ANTIBIOTIC CONSULT NOTE - FOLLOW UP  Pharmacy Consult for vancomycin Indication: CNS bacteremia with PCN allergy  Allergies  Allergen Reactions  . Amoxicillin Hives  . Bactrim [Sulfamethoxazole-Tmp Ds] Hives  . Erythromycin Hives  . Keflex [Cephalexin] Hives  . Penicillins Itching and Swelling    Sweating    Patient Measurements: Height: 5\' 2"  (157.5 cm) Weight: 214 lb 15.2 oz (97.5 kg) IBW/kg (Calculated) : 50.1 Vital Signs: Temp: 98.7 F (37.1 C) (06/10 1000) Temp src: Oral (06/10 0915) BP: 115/53 mmHg (06/10 1000) Pulse Rate: 84 (06/10 1000) Intake/Output from previous day: 06/09 0701 - 06/10 0700 In: 2290 [I.V.:240; NG/GT:1600; IV Piggyback:450] Out: 1300 [Urine:1300] Intake/Output from this shift: Total I/O In: 610 [I.V.:30; Blood:330; NG/GT:150; IV Piggyback:100] Out: 150 [Urine:150]  Labs:  Recent Labs  05/02/14 0430  WBC 7.3  HGB 5.4*  PLT 251  CREATININE 0.23*   Estimated Creatinine Clearance: 84.6 ml/min (by C-G formula based on Cr of 0.23).  Recent Labs  04/29/14 1620 05/02/14 0351  VANCOTROUGH 16.8 22.5*     Assessment: 58 yo female with paraplegia with Coag-negative S. Bacteremia and MDR Stenotroph - on vancomycin for 2 weeks, to stop on 6/13. Pt is paraplegic so SCr not reflective of renal function.  Vancomycin trough drawn this morning was slightly supratherapeutic at 22.5, trough was drawn over an hour early, would expect true trough to be closer to 20.  No longer has a white count, afebrile.  Goal of Therapy:  Vancomycin trough level 15-20 mcg/ml  Plan:  1. Continue current dose - vancomycin 750 mg/12h 2. Follow SCr closely for any changes as a small change could have a large effect in this patient 3. Anticipated stop date 6/13     Hughes Better, PharmD, BCPS Clinical Pharmacist Pager: [phone] 05/02/2014 11:03 AM

## 2014-05-03 LAB — GLUCOSE, CAPILLARY
GLUCOSE-CAPILLARY: 179 mg/dL — AB (ref 70–99)
GLUCOSE-CAPILLARY: 149 mg/dL — AB (ref 70–99)
Glucose-Capillary: 106 mg/dL — ABNORMAL HIGH (ref 70–99)
Glucose-Capillary: 117 mg/dL — ABNORMAL HIGH (ref 70–99)
Glucose-Capillary: 130 mg/dL — ABNORMAL HIGH (ref 70–99)
Glucose-Capillary: 173 mg/dL — ABNORMAL HIGH (ref 70–99)

## 2014-05-03 LAB — TYPE AND SCREEN
ABO/RH(D): A POS
Antibody Screen: NEGATIVE
Unit division: 0
Unit division: 0

## 2014-05-03 LAB — CBC
HEMATOCRIT: 30.9 % — AB (ref 36.0–46.0)
HEMOGLOBIN: 9.2 g/dL — AB (ref 12.0–15.0)
MCH: 26.1 pg (ref 26.0–34.0)
MCHC: 29.8 g/dL — AB (ref 30.0–36.0)
MCV: 87.8 fL (ref 78.0–100.0)
Platelets: 346 10*3/uL (ref 150–400)
RBC: 3.52 MIL/uL — ABNORMAL LOW (ref 3.87–5.11)
RDW: 19.5 % — ABNORMAL HIGH (ref 11.5–15.5)
WBC: 9 10*3/uL (ref 4.0–10.5)

## 2014-05-03 LAB — BASIC METABOLIC PANEL
BUN: 28 mg/dL — ABNORMAL HIGH (ref 6–23)
CO2: 39 mEq/L — ABNORMAL HIGH (ref 19–32)
Calcium: 9.3 mg/dL (ref 8.4–10.5)
Chloride: 96 mEq/L (ref 96–112)
Creatinine, Ser: 0.32 mg/dL — ABNORMAL LOW (ref 0.50–1.10)
GLUCOSE: 148 mg/dL — AB (ref 70–99)
POTASSIUM: 4.8 meq/L (ref 3.7–5.3)
SODIUM: 142 meq/L (ref 137–147)

## 2014-05-03 MED ORDER — SODIUM CHLORIDE 0.9 % IV BOLUS (SEPSIS)
500.0000 mL | Freq: Once | INTRAVENOUS | Status: AC
  Administered 2014-05-03: 500 mL via INTRAVENOUS

## 2014-05-03 NOTE — Progress Notes (Signed)
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 We are not currently treating her for this bacteria since it is not causing her an infection at this time.   For purposes of a skilled nursing facility, patients are not required to be on isolation for this bacteria. Patient does not need to be in a single patient room since she does not have MRSA or Cdifficile infection at this time.  If questions, please do not hesistate to contact me.  Joanna Perez for Infectious Diseases [phone]

## 2014-05-03 NOTE — Evaluation (Signed)
Occupational Therapy Evaluation Patient Details Name: Joanna Perez MRN: [id number] DOB: October 21, 1956 Today's Date: 05/03/2014    History of Present Illness 58 yo  transferred from [facility] (adm s/p fall) for BLE paraplegia, found to have T6 fracture with spinal stenosis and spinal cord compression on MRI. She was taken for decompressive surgery (T5-8) and remained on mechanical ventilation post surgery. Pt now s/p trach and has been unable to wean off vent. Peg 04/26/14 PMHx- with COPD, CHF, DM, CAD s/p MI, atrial fibrillation; Pt on home 4L 02.   Clinical Impression   Patient is s/p T5-8 decompression surgery resulting in functional limitations due to the deficits listed below (see OT problem list). Pt currently requires Peg and Vent support. Patient will benefit from skilled OT acutely to increase independence and safety with ADLS to allow discharge SNF. Pt could benefit from icu rotating mattress overlay for skin integrity and pressure relief. Pt could benefit from ted hose to help with BP during EOB sitting with therapy. OT to follow acutely for BIL UE HEP, bed mobility, static sitting and incr endurance for adl retraining.     Follow Up Recommendations  SNF;Supervision/Assistance - 24 hour    Equipment Recommendations  Other (comment) (defer SNF)    Recommendations for Other Services       Precautions / Restrictions Precautions Precautions: Fall Precaution Comments: bil pillow prafo boots,  Restrictions Weight Bearing Restrictions: No      Mobility Bed Mobility               General bed mobility comments: total +2 max (A) to reposition UB in bed supine  Transfers                      Balance                                            ADL Overall ADL's : Needs assistance/impaired Eating/Feeding: NPO                   Lower Body Dressing: +2 for physical assistance;Total assistance                 General ADL  Comments: Pt provided BIL UE AROM , repositioned UB in bed and ankle pumps. pt declining to roll with therapist. RN reports breakdown on buttock. Recommend ICU rotating mattress overlay bed for pressure relief and protection of skin. pt reports fear with all mobility. OT was unable to get patient to perform bed mobility. Attempted to place pt in bed chair position. Due to patients posterior pelvic tilt and kyphotic posture was unable to sustain positioning and progress chair into full chair position at thi stime. Chair position attempted due to MAP 55 at start of session. MAP increased to 62 during session. Recommend ted hose or ace wraps for BIL LE to assist with BP     Vision                     Perception     Praxis      Pertinent Vitals/Pain Reports 10 out 10 pain back area. Pt premedicated by RN Vent 40% trach      Hand Dominance Left (pt is lt handed and communicating writing with LT)   Extremity/Trunk Assessment Upper Extremity Assessment Upper Extremity Assessment: Generalized weakness;RUE deficits/detail;LUE deficits/detail RUE  Deficits / Details: WFL AROM, grasp 4 out 5, elbow WFL LUE Deficits / Details: shoulder flexion ~80 degrees assessed. tremor noted with all movement. Pt with decr grasp 3 out 5, WFL supination/ pronation LUE Coordination: decreased fine motor;decreased gross motor   Lower Extremity Assessment Lower Extremity Assessment: Defer to PT evaluation (decr sensation)   Cervical / Trunk Assessment Cervical / Trunk Assessment: Other exceptions Cervical / Trunk Exceptions: kyphotic posture, scapula protraction and downwardly rotated   Communication Communication Communication: Tracheostomy;Other (comment) (provided a clipboard with pen/ paper today to communicate)   Cognition Arousal/Alertness: Awake/alert Behavior During Therapy: Anxious Overall Cognitive Status: Difficult to assess Area of Impairment: Orientation;Memory;Following  commands Orientation Level: Disoriented to;Time Current Attention Level: Sustained   Following Commands: Follows one step commands with increased time       General Comments: information obtained by asking patient to write with clipboard and pen. pt communicated location and DOB. Pt was uncertain of date/ time. Pt currently does not have a calendar in her room so therapist reoriented patient.   General Comments       Exercises Exercises: General Upper Extremity;General Lower Extremity     Shoulder Instructions      Home Living Family/patient expects to be discharged to:: Skilled nursing facility                                 Additional Comments: uncertain of PTA environment. Place is for vent SNF at this time      Prior Functioning/Environment Level of Independence: Independent with assistive device(s)             OT Diagnosis: Generalized weakness;Acute pain;Paresis   OT Problem List: Decreased strength;Decreased range of motion;Decreased activity tolerance;Impaired balance (sitting and/or standing);Decreased safety awareness;Decreased knowledge of use of DME or AE;Decreased knowledge of precautions;Cardiopulmonary status limiting activity;Impaired sensation;Obesity;Impaired UE functional use;Pain   OT Treatment/Interventions: Self-care/ADL training;Therapeutic exercise;Neuromuscular education;DME and/or AE instruction;Therapeutic activities;Patient/family education;Balance training    OT Goals(Current goals can be found in the care plan section) Acute Rehab OT Goals Patient Stated Goal: none stated at this time OT Goal Formulation: With patient Time For Goal Achievement: 05/17/14 Potential to Achieve Goals: Fair  OT Frequency: Min 2X/week   Barriers to D/C: Decreased caregiver support  unknown assistance at this time       Co-evaluation              End of Session Nurse Communication: Mobility status;Precautions  Activity Tolerance:  Patient tolerated treatment well Patient left: in bed;with call bell/phone within reach   Time: 0910-0943 OT Time Calculation (min): 33 min Charges:  OT General Charges $OT Visit: 1 Procedure OT Evaluation $Initial OT Evaluation Tier I: 1 Procedure OT Treatments $Therapeutic Activity: 23-37 mins G-Codes:    Parke Poisson B 05-26-14, 10:30 AM Pager: [phone]

## 2014-05-03 NOTE — Progress Notes (Signed)
Physical Therapy Treatment Patient Details Name: Joanna Perez MRN: [id number] DOB: [birth year] Today's Date: 05/03/2014    History of Present Illness 58 yo  transferred from [facility] (adm s/p fall) for BLE paraplegia, found to have T6 fracture with spinal stenosis and spinal cord compression on MRI. She was taken for decompressive surgery (T5-8) and remained on mechanical ventilation post surgery. Pt now s/p trach and has been unable to wean off vent. Peg 04/26/14 PMHx- with COPD, CHF, DM, CAD s/p MI, atrial fibrillation; Pt on home 4L 02.    PT Comments    Pt progressing towards physical therapy goals. Assist required for transfer to sitting position, with BP increasing an appropriate amount  - no orthostatics noted. Pt anxious with mobility, and asking for more oxygen even after telling pt her sats were at 100% on the monitors. Pt able to tolerate sitting EOB ~5 minutes. Will continue to follow and progress as able.   Follow Up Recommendations  SNF;Supervision/Assistance - 24 hour     Equipment Recommendations  None recommended by PT    Recommendations for Other Services OT consult     Precautions / Restrictions Precautions Precautions: Fall Precaution Comments: bil pillow prafo boots,  Restrictions Weight Bearing Restrictions: No    Mobility  Bed Mobility Overal bed mobility: +2 for physical assistance;Needs Assistance Bed Mobility: Rolling;Sidelying to Sit;Sit to Sidelying Rolling: Total assist;+2 for physical assistance Sidelying to sit: Total assist;+2 for physical assistance     Sit to sidelying: Max assist;+2 for physical assistance;+2 for safety/equipment General bed mobility comments: VC's for sequencing and technique. Pt anxious with movement, but was able to achieve sitting EOB with total assist +2. RUE use on bed rail for support.   Transfers                    Ambulation/Gait                 Stairs            Wheelchair Mobility     Modified Rankin (Stroke Patients Only)       Balance Overall balance assessment: Needs assistance Sitting-balance support: Feet supported;Bilateral upper extremity supported Sitting balance-Leahy Scale: Poor Sitting balance - Comments: Pt tolerated EOB for ~5 minutes with posterior support. Pt cued for anterior lean and for use of UE's to help anterior motion of trunk.                             Cognition Arousal/Alertness: Awake/alert Behavior During Therapy: Anxious Overall Cognitive Status: Difficult to assess Area of Impairment: Following commands;Problem solving       Following Commands: Follows one step commands with increased time     Problem Solving: Slow processing;Decreased initiation;Requires verbal cues      Exercises      General Comments        Pertinent Vitals/Pain Vitals stable throughout session on ventilator support 40% O2.     Home Living                      Prior Function            PT Goals (current goals can now be found in the care plan section) Acute Rehab PT Goals Patient Stated Goal: none stated at this time PT Goal Formulation: With patient Time For Goal Achievement: 05/10/14 Potential to Achieve Goals: Good Progress towards PT goals: Progressing toward goals  Frequency  Min 2X/week    PT Plan Current plan remains appropriate    Co-evaluation             End of Session Equipment Utilized During Treatment: Oxygen Activity Tolerance: Patient limited by fatigue;Patient limited by pain Patient left: in bed;with call bell/phone within reach     Time: 1413-1437 PT Time Calculation (min): 24 min  Charges:  $Therapeutic Activity: 23-37 mins                    G Codes:      Jolyn Lent 2014/05/24, 3:57 PM  Jolyn Lent, PT, DPT Acute Rehabilitation Services Pager: [phone]

## 2014-05-03 NOTE — Progress Notes (Signed)
PULMONARY / CRITICAL CARE MEDICINE  Name: Joanna Perez MRN: [id number] DOB: [birth year]    ADMISSION DATE:  04/07/2014 CONSULTATION DATE:  04/09/2014  REFERRING MD :  Dr. Annette Stable PRIMARY SERVICE: PCCM  CHIEF COMPLAINT:  Paraplegia   BRIEF PATIENT DESCRIPTION: 58 yo with COPD, CHF, DM, CAD s/p MI, atrial fibrillation transferred from [facility] for BLE paraplegia, found to have T6 fracture with spinal stenosis and spinal cord compression on MRI. She was taken for decompressive surgery and remained on mechanical ventilation post surgery.   SIGNIFICANT EVENTS / STUDIES:  5/16  Admitted to [facility], hypotensive and with new paraplegia 5/17  Transfer to [facility] 5/17  MR Cervical/Thoracic/Lumbar Spine - worsened T6 compression fracture with paraspinal and epidural hematoma causing cord compression and edema, possible early cord infarct 5/18  Intubation for procedure 5/18  Thoracic laminectomy 5/18  Extubated 5/19  Somnolent / hypercarbic >>> reintubated 5/26  Tracheostomy 5/28  TTE >>> Ef 65%, vegetation is not reported 6/4    G-tube placement 6/10  Transfused PRBC  LINES / TUBES: ETT 5/18 >>> 5/18; 5/19 >>>5/26 R R AL 5/17 >>> out Foley 5/16 >>> R PICC 5/22 >>> 5/28 Trach 5/26 (df) >>> PEG 6/4 >>>  CULTURES: 5/16  Blood >>>  COAG NEG STAPH 5/16  Urine >>> neg 5/25 Sputum >  STENOTROPHOMONAS MALTOPHILIA 5/26  Sputum >>> STENOTROPHOMONAS MALTOPHILIA 5/26  Blood >>> COAG NEG STAPH 5/30  Blood  >>> neg  ANTIBIOTICS per ID: Vancomycin 5/17 >>> Levaquin 5/17 >>> 5/19 Imipenem 5/28 >>> 6/1  INTERVAL HISTORY: RN reports low urine output.   VITAL SIGNS: Temp:  [97.6 F (36.4 C)-99.1 F (37.3 C)] 99.1 F (37.3 C) (06/11 1225) Pulse Rate:  [65-84] 83 (06/11 1225) Resp:  [15-22] 22 (06/11 1225) BP: (90-134)/(50-64) 116/64 mmHg (06/11 1225) SpO2:  [93 %-100 %] 100 % (06/11 1225) FiO2 (%):  [30 %-40 %] 40 % (06/11 1139) Weight:  [97.5 kg (214 lb 15.2 oz)] 97.5 kg (214 lb  15.2 oz) (06/11 0400)  HEMODYNAMICS: CVP:  [8 mmHg-12 mmHg] 8 mmHg  VENTILATOR SETTINGS: Vent Mode:  [-] PRVC FiO2 (%):  [30 %-40 %] 40 % Set Rate:  [20 bmp] 20 bmp Vt Set:  [400 mL] 400 mL PEEP:  [5 cmH20] 5 cmH20 Plateau Pressure:  [21 cmH20-26 cmH20] 21 cmH20  INTAKE / OUTPUT: Intake/Output     06/10 0701 - 06/11 0700 06/11 0701 - 06/12 0700   I.V. (mL/kg) 230 (2.4) 70 (0.7)   Blood 660    NG/GT 1950 550   IV Piggyback 550    Total Intake(mL/kg) 3390 (34.8) 620 (6.4)   Urine (mL/kg/hr) 2050 (0.9) 125 (0.2)   Stool 1 (0)    Total Output 2051 125   Net +1339 +495          PHYSICAL EXAMINATION: General:  Resting in no distress Neuro:  Asleep, but arouses when stimulated HEENT:  Tracheostomy site intact Cardiovascular:  Regular, no murmurs Lungs:  Scatted rhonchi, few wheezes Abdomen:  Obese, soft, bowel sounds present Musculoskeletal:  No edema Skin:  No rash  LABS: CBC  Recent Labs Lab 05/02/14 0430 05/02/14 1556 05/03/14 0530  WBC 7.3 11.2* 9.0  HGB 5.4* 9.5* 9.2*  HCT 18.2* 31.8* 30.9*  PLT 251 327 346   Coag's No results found for this basename: APTT, INR,  in the last 168 hours  BMET  Recent Labs Lab 04/29/14 0620 05/02/14 0430 05/03/14 0530  NA 139 148* 142  K 4.4 2.9* 4.8  CL 94* 110 96  CO2 41* 27 39*  BUN 21 20 28*  CREATININE 0.34* 0.23* 0.32*  GLUCOSE 139* 98 148*   Electrolytes  Recent Labs Lab 04/29/14 0620 05/02/14 0430 05/03/14 0530  CALCIUM 8.6 5.7* 9.3   Sepsis Markers No results found for this basename: LATICACIDVEN, PROCALCITON, O2SATVEN,  in the last 168 hours ABG No results found for this basename: PHART, PCO2ART, PO2ART,  in the last 168 hours Liver Enzymes  Recent Labs Lab 04/27/14 0230  AST 20  ALT 14  ALKPHOS 81  BILITOT <0.2*  ALBUMIN 1.8*   Cardiac Enzymes No results found for this basename: TROPONINI, PROBNP,  in the last 168 hours Glucose  Recent Labs Lab 05/02/14 1610 05/02/14 2024  05/03/14 0001 05/03/14 0417 05/03/14 0721 05/03/14 1224  GLUCAP 128* 153* 117* 106* 130* 179*   IMAGING: No results found.  ASSESSMENT / PLAN:  PULMONARY A: Acute on chronic respiratory failure Respiratory muscle weakness secondary to cord compression / new T6 paraplegia  COPD with possible exacerbation Suspect OSA / OHS Tracheostomy status P:    Goal SpO2>92 Wean on CPAP / PS as tolerated Awaiting chronic vent facility placement DuoNeb / Albuterol / Pulmicort Prednisone 10 (chronic, preadmission ) SLP  CARDIOVASCULAR A:  Chronic diastolic heart failure P:  No intervention required   RENAL A:   Decreased urine output Hypovolemia? P:   Trend BMP Free water change to q6h NS 500 x 1  GASTROINTESTINAL A:   Nutrition GERD P:   TF Protonix as preadmission  HEMATOLOGIC A:   Anemia, no overt hemorrhage, stable post transfusion VTE Px P:  Trend CBC SCD  INFECTIOUS A:   Bacteremia with COAG NEG STAPH 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, 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 Endocarditis unlikely, negative TTE Allergy PCN / cephalosporins P:  Vancomycin through June 13 per ID  ENDOCRINE  A:   Hyperglycemia Hypothyroidism P:   SSI Synthroid ( increased earlier ) TSH check 7/2  NEUROLOGIC A:   T6 cord compression / paraplegia, s/p spinal cord decompression, still no LE movement Delirium, resolved Anxiety P:   Vicodin / Dilauded Ativan PRN Celexa, Xanax as preadmission PT Order rotation bed  I have personally obtained history, examined patient, evaluated and interpreted laboratory and imaging results, reviewed medical records, formulated assessment / plan and placed orders.  Doree Fudge, MD [facility] [place] Pager: [phone]  05/03/2014, 2:11 PM

## 2014-05-04 ENCOUNTER — Encounter: Payer: Medicaid Other | Admitting: Cardiology

## 2014-05-04 ENCOUNTER — Encounter: Payer: Self-pay | Admitting: Cardiology

## 2014-05-04 DIAGNOSIS — S22009A Unspecified fracture of unspecified thoracic vertebra, initial encounter for closed fracture: Secondary | ICD-10-CM | POA: Diagnosis not present

## 2014-05-04 DIAGNOSIS — R579 Shock, unspecified: Secondary | ICD-10-CM | POA: Diagnosis not present

## 2014-05-04 DIAGNOSIS — E43 Unspecified severe protein-calorie malnutrition: Secondary | ICD-10-CM | POA: Diagnosis not present

## 2014-05-04 LAB — GLUCOSE, CAPILLARY
GLUCOSE-CAPILLARY: 156 mg/dL — AB (ref 70–99)
Glucose-Capillary: 114 mg/dL — ABNORMAL HIGH (ref 70–99)
Glucose-Capillary: 117 mg/dL — ABNORMAL HIGH (ref 70–99)
Glucose-Capillary: 160 mg/dL — ABNORMAL HIGH (ref 70–99)

## 2014-05-04 NOTE — Progress Notes (Signed)
CSW received message from vent SNF that their financial team is waiting for life insurance verification before they can proceed with Medicaid application review. CSW following and will discharge pt to vent SNF as soon as financial piece is complete.   Ky Barban, MSW, [facility] Clinical Social Worker [phone]

## 2014-05-04 NOTE — Progress Notes (Deleted)
Clinical Summary Ms. Ellwood is a medically complex 58 y.o.female last seen in the office by Ms. Lawrence NP in March of this year. Record review finds recurrent hospitalizations since that time related to COPD, and most recently    Allergies  Allergen Reactions  . Amoxicillin Hives  . Bactrim [Sulfamethoxazole-Tmp Ds] Hives  . Erythromycin Hives  . Keflex [Cephalexin] Hives  . Penicillins Itching and Swelling    Sweating    No current facility-administered medications for this visit.   No current outpatient prescriptions on file.   Facility-Administered Medications Ordered in Other Visits  Medication Dose Route Frequency Provider Last Rate Last Dose  . acetaminophen (TYLENOL) solution 650 mg  650 mg Per Tube Q4H PRN Doree Fudge, MD   650 mg at 05/03/14 0754  . albuterol (PROVENTIL) (2.5 MG/3ML) 0.083% nebulizer solution 2.5 mg  2.5 mg Nebulization Q2H PRN Doree Fudge, MD   2.5 mg at 04/10/14 1441  . ALPRAZolam Duanne Moron) tablet 1 mg  1 mg Oral TID Rigoberto Noel, MD   1 mg at 05/04/14 0916  . alum & mag hydroxide-simeth (MAALOX/MYLANTA) 200-200-20 MG/5ML suspension 30 mL  30 mL Oral Q6H PRN Charlie Pitter, MD      . antiseptic oral rinse (BIOTENE) solution 15 mL  15 mL Mouth Rinse QID Charlie Pitter, MD   15 mL at 05/04/14 0400  . bisacodyl (DULCOLAX) EC tablet 5 mg  5 mg Oral Daily PRN Nimish Luther Parody, MD      . bisacodyl (DULCOLAX) suppository 10 mg  10 mg Rectal Daily PRN Charlie Pitter, MD      . budesonide (PULMICORT) nebulizer solution 0.5 mg  0.5 mg Nebulization BID Raylene Miyamoto, MD   0.5 mg at 05/04/14 0742  . chlorhexidine (PERIDEX) 0.12 % solution 15 mL  15 mL Mouth Rinse BID Charlie Pitter, MD   15 mL at 05/04/14 0913  . cholecalciferol (VITAMIN D) tablet 400 Units  400 Units Oral Daily Doree Albee, MD   400 Units at 05/04/14 0914  . citalopram (CELEXA) tablet 20 mg  20 mg Oral Daily Doree Albee, MD   20 mg at 05/04/14 0916  . dextrose 5  % solution   Intravenous Continuous Erick Colace, NP 10 mL/hr at 05/01/14 1142    . feeding supplement (VITAL HIGH PROTEIN) liquid 1,000 mL  1,000 mL Per Tube Q24H Heather Cornelison Pitts, RD   1,000 mL at 05/04/14 0936  . free water 200 mL  200 mL Per Tube Q6H Doree Fudge, MD   200 mL at 05/04/14 0630  . HYDROcodone-acetaminophen (NORCO/VICODIN) 5-325 MG per tablet 1-2 tablet  1-2 tablet Oral Q4H PRN Jacqulynn Cadet, MD   2 tablet at 05/04/14 1050  . HYDROmorphone (DILAUDID) injection 1 mg  1 mg Intravenous Q2H PRN Jacqulynn Cadet, MD   1 mg at 05/03/14 7353  . insulin aspart (novoLOG) injection 0-15 Units  0-15 Units Subcutaneous 6 times per day Doree Fudge, MD   3 Units at 05/04/14 0926  . ipratropium-albuterol (DUONEB) 0.5-2.5 (3) MG/3ML nebulizer solution 3 mL  3 mL Nebulization BID Raylene Miyamoto, MD   3 mL at 05/04/14 0742  . levothyroxine (SYNTHROID, LEVOTHROID) tablet 137 mcg  137 mcg Oral QAC breakfast Raylene Miyamoto, MD   137 mcg at 05/04/14 0914  . LORazepam (ATIVAN) injection 2 mg  2 mg Intravenous Q4H PRN Doree Fudge, MD      . menthol-cetylpyridinium (CEPACOL)  lozenge 3 mg  1 lozenge Oral PRN Charlie Pitter, MD       Or  . phenol (CHLORASEPTIC) mouth spray 1 spray  1 spray Mouth/Throat PRN Charlie Pitter, MD      . ondansetron [facility]) tablet 4 mg  4 mg Oral Q6H PRN Nimish Luther Parody, MD       Or  . ondansetron (ZOFRAN) injection 4 mg  4 mg Intravenous Q6H PRN Nimish C Gosrani, MD   4 mg at 04/08/14 1200  . ondansetron (ZOFRAN) injection 4 mg  4 mg Intravenous Q4H PRN Charlie Pitter, MD   4 mg at 04/18/14 0215  . pantoprazole sodium (PROTONIX) 40 mg/20 mL oral suspension 40 mg  40 mg Per Tube Daily Doree Fudge, MD   40 mg at 05/04/14 0915  . polyethylene glycol (MIRALAX / GLYCOLAX) packet 17 g  17 g Oral Daily PRN Charlie Pitter, MD   17 g at 04/17/14 2206  . potassium chloride 20 MEQ/15ML (10%) liquid 20 mEq  20 mEq Per Tube  Daily Erick Colace, NP   20 mEq at 05/04/14 0915  . predniSONE (DELTASONE) tablet 10 mg  10 mg Oral Q breakfast Raylene Miyamoto, MD   10 mg at 05/04/14 0914  . senna (SENOKOT) tablet 8.6 mg  1 tablet Oral BID Charlie Pitter, MD   8.6 mg at 05/04/14 0914  . sodium chloride (OCEAN) 0.65 % nasal spray 1 spray  1 spray Each Nare PRN Nimish C Gosrani, MD      . sodium chloride 0.9 % injection 10-40 mL  10-40 mL Intracatheter Q12H Raylene Miyamoto, MD   10 mL at 05/04/14 0915  . sodium chloride 0.9 % injection 10-40 mL  10-40 mL Intracatheter PRN Raylene Miyamoto, MD      . vancomycin (VANCOCIN) IVPB 750 mg/150 ml premix  750 mg Intravenous Q12H Raylene Miyamoto, MD   750 mg at 05/04/14 6568    Past Medical History  Diagnosis Date  . Asthma   . COPD (chronic obstructive pulmonary disease)     Home O2 - 4L  . Hypothyroidism   . Essential hypertension, benign   . Hyperlipidemia   . Type 2 diabetes mellitus   . Atrial fibrillation   . MI (myocardial infarction)     2012  . Pericardial effusion   . Chronic diastolic heart failure     May 2015  . Thoracic compression fracture   . Ventral hernia     Social History Ms. Stankowski reports that she quit smoking about 5 months ago. Her smoking use included Cigarettes. She has a 52.5 pack-year smoking history. She has never used smokeless tobacco. Ms. Lippman reports that she drinks alcohol.  Review of Systems   Physical Examination There were no vitals filed for this visit. There were no vitals filed for this visit.  Chronically ill-appearing overweight woman, no distress.  HEENT: Conjunctiva and lids normal, oropharynx clear with poor dentition.  Neck: Supple, no elevated JVP or carotid bruits, no thyromegaly.  Lungs: Decreased breath sounds with prolonged expiratory phase, nonlabored breathing at rest.  Cardiac: Regular rate and rhythm, distant, no S3, no pericardial rub.  Abdomen: Soft, nontender, protuberant, bowel sounds  present, no guarding or rebound.  Extremities: 1+ edema, distal pulses 1-2+.  Skin: Warm and dry. Scattered ecchymoses.  Musculoskeletal: No kyphosis.  Neuropsychiatric: Alert and oriented x3, affect grossly appropriate.   Problem List and Plan   No problem-specific  assessment & plan notes found for this encounter.   Satira Sark, M.D., F.A.C.C.

## 2014-05-04 NOTE — Progress Notes (Signed)
PULMONARY / CRITICAL CARE MEDICINE  Name: Joanna Perez MRN: [id number] DOB: [birth year]    ADMISSION DATE:  04/07/2014 CONSULTATION DATE:  04/09/2014  REFERRING MD :  Dr. Annette Stable PRIMARY SERVICE: PCCM  CHIEF COMPLAINT:  Paraplegia   BRIEF PATIENT DESCRIPTION: 58 yo with COPD, CHF, DM, CAD s/p MI, atrial fibrillation transferred from [facility] for BLE paraplegia, found to have T6 fracture with spinal stenosis and spinal cord compression on MRI. She was taken for decompressive surgery and remained on mechanical ventilation post surgery.   SIGNIFICANT EVENTS / STUDIES:  5/16  Admitted to [facility], hypotensive and with new paraplegia 5/17  Transfer to [facility] 5/17  MR Cervical/Thoracic/Lumbar Spine - worsened T6 compression fracture with paraspinal and epidural hematoma causing cord compression and edema, possible early cord infarct 5/18  Intubation for procedure 5/18  Thoracic laminectomy 5/18  Extubated 5/19  Somnolent / hypercarbic >>> reintubated 5/26  Tracheostomy 5/28  TTE >>> Ef 65%, vegetation is not reported 6/4    G-tube placement 6/10  Transfused PRBC  LINES / TUBES: ETT 5/18 >>> 5/18; 5/19 >>>5/26 R R AL 5/17 >>> out Foley 5/16 >>> R PICC 5/22 >>> 5/28 Trach 5/26 (df) >>> PEG 6/4 >>>  CULTURES: 5/16  Blood >>>  COAG NEG STAPH 5/16  Urine >>> neg 5/25 Sputum >  STENOTROPHOMONAS MALTOPHILIA 5/26  Sputum >>> STENOTROPHOMONAS MALTOPHILIA 5/26  Blood >>> COAG NEG STAPH 5/30  Blood  >>> neg  ANTIBIOTICS per ID: Vancomycin 5/17 >>> Levaquin 5/17 >>> 5/19 Imipenem 5/28 >>> 6/1  INTERVAL HISTORY: Improved urine output after NS bolus.  VITAL SIGNS: Temp:  [98.2 F (36.8 C)-99.1 F (37.3 C)] 98.6 F (37 C) (06/12 0759) Pulse Rate:  [72-89] 89 (06/12 0437) Resp:  [19-22] 21 (06/12 0437) BP: (66-133)/(50-64) 133/54 mmHg (06/12 0437) SpO2:  [94 %-100 %] 97 % (06/12 0745) FiO2 (%):  [40 %] 40 % (06/12 0802) Weight:  [99.791 kg (220 lb)] 99.791 kg (220 lb)  (06/12 0437)  HEMODYNAMICS: CVP:  [9 mmHg-13 mmHg] 13 mmHg  VENTILATOR SETTINGS: Vent Mode:  [-] PRVC FiO2 (%):  [40 %] 40 % Set Rate:  [20 bmp] 20 bmp Vt Set:  [400 mL] 400 mL PEEP:  [5 cmH20] 5 cmH20 Plateau Pressure:  [21 cmH20-27 cmH20] 27 cmH20  INTAKE / OUTPUT: Intake/Output     06/11 0701 - 06/12 0700 06/12 0701 - 06/13 0700   I.V. (mL/kg) 240 (2.4) 10 (0.1)   Blood     NG/GT 2000    IV Piggyback 650    Total Intake(mL/kg) 2890 (29) 10 (0.1)   Urine (mL/kg/hr) 125 (0.1) 400 (1)   Stool     Total Output 125 400   Net +2765 -390        Urine Occurrence 1 x      PHYSICAL EXAMINATION: General:  Resting, no distress Neuro:  Awake, alert HEENT:  Tracheostomy clean Cardiovascular:  Regular, no murmurs Lungs:  Few wheezes / rhonchi Abdomen:  Obese, soft, bowel sounds present Musculoskeletal:  No edema Skin:  No rash  LABS: CBC  Recent Labs Lab 05/02/14 0430 05/02/14 1556 05/03/14 0530  WBC 7.3 11.2* 9.0  HGB 5.4* 9.5* 9.2*  HCT 18.2* 31.8* 30.9*  PLT 251 327 346   Coag's No results found for this basename: APTT, INR,  in the last 168 hours  BMET  Recent Labs Lab 04/29/14 0620 05/02/14 0430 05/03/14 0530  NA 139 148* 142  K 4.4 2.9* 4.8  CL 94* 110 96  CO2 41* 27 39*  BUN 21 20 28*  CREATININE 0.34* 0.23* 0.32*  GLUCOSE 139* 98 148*   Electrolytes  Recent Labs Lab 04/29/14 0620 05/02/14 0430 05/03/14 0530  CALCIUM 8.6 5.7* 9.3   Sepsis Markers No results found for this basename: LATICACIDVEN, PROCALCITON, O2SATVEN,  in the last 168 hours ABG No results found for this basename: PHART, PCO2ART, PO2ART,  in the last 168 hours Liver Enzymes No results found for this basename: AST, ALT, ALKPHOS, BILITOT, ALBUMIN,  in the last 168 hours Cardiac Enzymes No results found for this basename: TROPONINI, PROBNP,  in the last 168 hours Glucose  Recent Labs Lab 05/03/14 1224 05/03/14 1539 05/03/14 2012 05/04/14 0027 05/04/14 0431  05/04/14 0758  GLUCAP 179* 173* 149* 117* 114* 160*   IMAGING: No results found.  ASSESSMENT / PLAN:  PULMONARY A: Acute on chronic respiratory failure Respiratory muscle weakness secondary to cord compression / new T6 paraplegia  COPD with possible exacerbation Suspect OSA / OHS Tracheostomy status P:    Goal SpO2>92 Wean on CPAP / PS as tolerated Awaiting chronic vent facility placement DuoNeb / Albuterol / Pulmicort Prednisone 10 (chronic, preadmission ) SLP  CARDIOVASCULAR A:  Chronic diastolic heart failure P:  No intervention required   RENAL A:   Decreased urine output Hypovolemia? P:   Trend BMP Free water change to q6h  GASTROINTESTINAL A:   Nutrition GERD P:   TF Protonix as preadmission  HEMATOLOGIC A:   Anemia, no overt hemorrhage, stable post transfusion VTE Px P:  Trend CBC SCD  INFECTIOUS A:   Bacteremia with COAG NEG STAPH 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, 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 Endocarditis unlikely, negative TTE Allergy PCN / cephalosporins P:  Vancomycin through June 13 per ID  ENDOCRINE  A:   Hyperglycemia Hypothyroidism P:   SSI Synthroid ( increased earlier ) TSH check 7/2  NEUROLOGIC A:   T6 cord compression / paraplegia, s/p spinal cord decompression, still no LE movement Delirium, resolved Anxiety P:   Vicodin / Dilauded Ativan PRN Celexa, Xanax as preadmission PT Rotation bed ordered  I have personally obtained history, examined patient, evaluated and interpreted laboratory and imaging results, reviewed medical records, formulated assessment / plan and placed orders.  Doree Fudge, MD Pulmonary and [place] Pager: [phone]  05/04/2014, 11:08 AM

## 2014-05-04 NOTE — Progress Notes (Signed)
Patient in hospital. This encounter was created in error - please disregard. 

## 2014-05-05 LAB — CBC
HCT: 29.4 % — ABNORMAL LOW (ref 36.0–46.0)
HEMOGLOBIN: 8.7 g/dL — AB (ref 12.0–15.0)
MCH: 26.2 pg (ref 26.0–34.0)
MCHC: 29.6 g/dL — AB (ref 30.0–36.0)
MCV: 88.6 fL (ref 78.0–100.0)
Platelets: 305 10*3/uL (ref 150–400)
RBC: 3.32 MIL/uL — ABNORMAL LOW (ref 3.87–5.11)
RDW: 19.8 % — ABNORMAL HIGH (ref 11.5–15.5)
WBC: 11.8 10*3/uL — AB (ref 4.0–10.5)

## 2014-05-05 LAB — BASIC METABOLIC PANEL
BUN: 40 mg/dL — AB (ref 6–23)
CHLORIDE: 94 meq/L — AB (ref 96–112)
CO2: 36 mEq/L — ABNORMAL HIGH (ref 19–32)
Calcium: 9 mg/dL (ref 8.4–10.5)
Creatinine, Ser: 0.41 mg/dL — ABNORMAL LOW (ref 0.50–1.10)
GFR calc Af Amer: >90 mL/min (ref >90)
GFR calc non Af Amer: >90 mL/min (ref >90)
GLUCOSE: 127 mg/dL — AB (ref 70–99)
POTASSIUM: 4.7 meq/L (ref 3.7–5.3)
Sodium: 138 mEq/L (ref 137–147)

## 2014-05-05 LAB — GLUCOSE, CAPILLARY: Glucose-Capillary: 143 mg/dL — ABNORMAL HIGH (ref 70–99)

## 2014-05-05 MED ORDER — SODIUM CHLORIDE 0.9 % IV BOLUS (SEPSIS)
500.0000 mL | Freq: Once | INTRAVENOUS | Status: AC
  Administered 2014-05-05: 500 mL via INTRAVENOUS

## 2014-05-05 MED ORDER — VITAL HIGH PROTEIN PO LIQD
1000.0000 mL | ORAL | Status: DC
  Administered 2014-05-05 – 2014-05-16 (×9): 1000 mL
  Filled 2014-05-05 (×19): qty 1000

## 2014-05-05 NOTE — Progress Notes (Signed)
PULMONARY / CRITICAL CARE MEDICINE  Name: Joanna Perez MRN: [id number] DOB: Feb 19, 1956    ADMISSION DATE:  04/07/2014 CONSULTATION DATE:  04/09/2014  REFERRING MD :  Dr. Annette Stable PRIMARY SERVICE: PCCM  CHIEF COMPLAINT:  Paraplegia   BRIEF PATIENT DESCRIPTION: 58 yo with COPD, CHF, DM, CAD s/p MI, atrial fibrillation transferred from [facility] for BLE paraplegia, found to have T6 fracture with spinal stenosis and spinal cord compression on MRI. She was taken for decompressive surgery and remained on mechanical ventilation post surgery.   SIGNIFICANT EVENTS / STUDIES:  5/16  Admitted to [facility], hypotensive and with new paraplegia 5/17  Transfer to [facility] 5/17  MR Cervical/Thoracic/Lumbar Spine - worsened T6 compression fracture with paraspinal and epidural hematoma causing cord compression and edema, possible early cord infarct 5/18  Intubation for procedure 5/18  Thoracic laminectomy 5/18  Extubated 5/19  Somnolent / hypercarbic >>> reintubated 5/26  Tracheostomy 5/28  TTE >>> Ef 65%, vegetation is not reported 6/4    G-tube placement 6/10  Transfused PRBC  LINES / TUBES: ETT 5/18 >>> 5/18; 5/19 >>>5/26 R R AL 5/17 >>> out Foley 5/16 >>> R PICC 5/22 >>> 5/28 Trach 5/26 (df) >>> PEG 6/4 >>>  CULTURES: 5/16  Blood >>>  COAG NEG STAPH 5/16  Urine >>> neg 5/25 Sputum >  STENOTROPHOMONAS MALTOPHILIA 5/26  Sputum >>> STENOTROPHOMONAS MALTOPHILIA 5/26  Blood >>> COAG NEG STAPH 5/30  Blood  >>> neg  ANTIBIOTICS per ID: Vancomycin 5/17 >>> Levaquin 5/17 >>> 5/19 Imipenem 5/28 >>> 6/1  INTERVAL HISTORY:  .no over night issues  VITAL SIGNS: Temp:  [97 F (36.1 C)-99.1 F (37.3 C)] 98.3 F (36.8 C) (06/13 0734) Pulse Rate:  [71-88] 88 (06/13 0800) Resp:  [17-23] 22 (06/13 0800) BP: (83-113)/(36-74) 95/47 mmHg (06/13 0800) SpO2:  [91 %-96 %] 95 % (06/13 0800) FiO2 (%):  [40 %] 40 % (06/13 0744) Weight:  [99.791 kg (220 lb)] 99.791 kg (220 lb) (06/13  0800)  HEMODYNAMICS: CVP:  [13 mmHg-23 mmHg] 23 mmHg  VENTILATOR SETTINGS: Vent Mode:  [-] PRVC FiO2 (%):  [40 %] 40 % Set Rate:  [20 bmp] 20 bmp Vt Set:  [400 mL] 400 mL PEEP:  [5 cmH20] 5 cmH20 Plateau Pressure:  [22 cmH20-27 cmH20] 22 cmH20  INTAKE / OUTPUT: Intake/Output     06/12 0701 - 06/13 0700 06/13 0701 - 06/14 0700   I.V. (mL/kg) 290 (2.9)    Other 375    NG/GT 1203.7    IV Piggyback 150    Total Intake(mL/kg) 2018.7 (20.2)    Urine (mL/kg/hr) 900 (0.4) 200 (0.8)   Total Output 900 200   Net +1118.7 -200          PHYSICAL EXAMINATION: General:  Resting, no distress Neuro:  Awake, alert HEENT:  Tracheostomy clean Cardiovascular:  Regular, no murmurs Lungs:  Few wheezes / rhonchi Abdomen:  Obese, soft, bowel sounds present Musculoskeletal:  No edema Skin:  No rash  LABS: CBC  Recent Labs Lab 05/02/14 1556 05/03/14 0530 05/05/14 0627  WBC 11.2* 9.0 11.8*  HGB 9.5* 9.2* 8.7*  HCT 31.8* 30.9* 29.4*  PLT 327 346 305   Coag's No results found for this basename: APTT, INR,  in the last 168 hours  BMET  Recent Labs Lab 05/02/14 0430 05/03/14 0530 05/05/14 0627  NA 148* 142 138  K 2.9* 4.8 4.7  CL 110 96 94*  CO2 27 39* 36*  BUN 20 28* 40*  CREATININE 0.23* 0.32* 0.41*  GLUCOSE 98 148* 127*   Electrolytes  Recent Labs Lab 05/02/14 0430 05/03/14 0530 05/05/14 0627  CALCIUM 5.7* 9.3 9.0   Sepsis Markers No results found for this basename: LATICACIDVEN, PROCALCITON, O2SATVEN,  in the last 168 hours ABG No results found for this basename: PHART, PCO2ART, PO2ART,  in the last 168 hours Liver Enzymes No results found for this basename: AST, ALT, ALKPHOS, BILITOT, ALBUMIN,  in the last 168 hours Cardiac Enzymes No results found for this basename: TROPONINI, PROBNP,  in the last 168 hours Glucose  Recent Labs Lab 05/03/14 1539 05/03/14 2012 05/04/14 0027 05/04/14 0431 05/04/14 0758 05/04/14 1151  GLUCAP 173* 149* 117* 114* 160*  156*   IMAGING: No results found.  ASSESSMENT / PLAN:  PULMONARY A: Acute on chronic respiratory failure Respiratory muscle weakness secondary to cord compression / new T6 paraplegia  COPD with possible exacerbation Suspect OSA / OHS Tracheostomy status P:    Goal SpO2>92 Wean on CPAP / PS as tolerated Awaiting chronic vent facility placement DuoNeb / Albuterol / Pulmicort Prednisone 10 (chronic, preadmission ) SLP  CARDIOVASCULAR A:  Chronic diastolic heart failure P:  No intervention required   RENAL A:   Decreased urine output Hypovolemia? P:   Trend BMP Free water change to q6h  GASTROINTESTINAL A:   Nutrition GERD P:   TF Protonix as preadmission  HEMATOLOGIC A:   Anemia, no overt hemorrhage, stable post transfusion VTE Px P:  Trend CBC SCD  INFECTIOUS A:   Bacteremia with COAG NEG STAPH 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, 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 Endocarditis unlikely, negative TTE Allergy PCN / cephalosporins Early stage II decubitus sacrum P:  Vancomycin through June 13 per ID  ENDOCRINE  A:   Hyperglycemia Hypothyroidism P:   SSI Synthroid ( increased earlier ) TSH check 7/2  NEUROLOGIC A:   T6 cord compression / paraplegia, s/p spinal cord decompression, still no LE movement Delirium, resolved Anxiety P:   Vicodin / Dilauded Ativan PRN Celexa, Xanax as preadmission PT Rotation bed ordered  I have personally obtained history, examined patient, evaluated and interpreted laboratory and imaging results, reviewed medical records, formulated assessment / plan and placed orders.  Asencion Noble, MD Pulmonary and [place] Pager: [phone]  05/05/2014, 9:26 AM

## 2014-05-06 DIAGNOSIS — J9610 Chronic respiratory failure, unspecified whether with hypoxia or hypercapnia: Secondary | ICD-10-CM

## 2014-05-06 LAB — GLUCOSE, CAPILLARY
GLUCOSE-CAPILLARY: 131 mg/dL — AB (ref 70–99)
Glucose-Capillary: 101 mg/dL — ABNORMAL HIGH (ref 70–99)

## 2014-05-06 NOTE — Consult Note (Signed)
WOC wound consult note Reason for Consult: Pressure ulceration at right buttock and right ischial tuberosity deteriorating. 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 Wound type:Pressure with moisture associated skin damage (IAD and intertriginous dermatitis) Pressure Ulcer POA: Yes (5/19 Stage II) Measurement: 4cm x  14cm x 0.2cm Wound bed:60% red, moist; 40% with thin yellow slough obscuring wound bed on right buttock/ischial tuberosity area. Drainage (amount, consistency, odor) serous exudate, small amount Periwound:intact with erythema, no induration.  Some peeling of the surrounding tissue in periarea consistent with fungal overgrowth. Dressing procedure/placement/frequency:We will place a soft silicone foam dressing over the right buttock and right ischial tuberosity pressure ulcers (Stage III) and strive to get HOB at or below a 30-degree angle to reduce pressure in this area while still provide respiratory support in addition to turning her side to side while in bed.  Additionally, patient already has Prevalon boots bilaterally and we will encourage their constant use (per protocal with Q8H releases) while in bed.  At this time, she presents with the afore mentioned stage III pressure ulcers as well as intertriginous dermatitis in the perineum and the intertriginous area beneath the panus.  For this we will provide InterDry Ag+, an antimicrobial textile product. Patient is already on a therapeutic mattress replacement with a low air loss feature for pressure redistribution and air flow to the affected areas. [facility] nursing team will not follow routinely, but will remain available to this patient, the nursing and medical teams.  Please re-consult if needed or if integumentary condition continues to deteriorate despite improvements in overall status. Thanks, Maudie Flakes, MSN, RN, [place], [place], [place] [phone])

## 2014-05-06 NOTE — Progress Notes (Signed)
PULMONARY / CRITICAL CARE MEDICINE  Name: Joanna Perez MRN: [id number] DOB: Aug 17, 1956    ADMISSION DATE:  04/07/2014 CONSULTATION DATE:  04/09/2014  REFERRING MD :  Dr. Annette Stable PRIMARY SERVICE: PCCM  CHIEF COMPLAINT:  Paraplegia   BRIEF PATIENT DESCRIPTION: 58 yo with COPD, CHF, DM, CAD s/p MI, atrial fibrillation transferred from [facility] for BLE paraplegia, found to have T6 fracture with spinal stenosis and spinal cord compression on MRI. She was taken for decompressive surgery and remained on mechanical ventilation post surgery.   SIGNIFICANT EVENTS / STUDIES:  5/16  Admitted to [facility], hypotensive and with new paraplegia 5/17  Transfer to [facility] 5/17  MR Cervical/Thoracic/Lumbar Spine - worsened T6 compression fracture with paraspinal and epidural hematoma causing cord compression and edema, possible early cord infarct 5/18  Intubation for procedure 5/18  Thoracic laminectomy 5/18  Extubated 5/19  Somnolent / hypercarbic >>> reintubated 5/26  Tracheostomy 5/28  TTE >>> Ef 65%, vegetation is not reported 6/4    G-tube placement 6/10  Transfused PRBC  LINES / TUBES: ETT 5/18 >>> 5/18; 5/19 >>>5/26 R R AL 5/17 >>> out Foley 5/16 >>> R PICC 5/22 >>> 5/28 Trach 5/26 (df) >>> PEG 6/4 >>>  CULTURES: 5/16  Blood >>>  COAG NEG STAPH 5/16  Urine >>> neg 5/25 Sputum >  STENOTROPHOMONAS MALTOPHILIA 5/26  Sputum >>> STENOTROPHOMONAS MALTOPHILIA 5/26  Blood >>> COAG NEG STAPH 5/30  Blood  >>> neg  ANTIBIOTICS per ID: Vancomycin 5/17 >>>6/13 Levaquin 5/17 >>> 5/19 Imipenem 5/28 >>> 6/1  INTERVAL HISTORY:  No over night issues  VITAL SIGNS: Temp:  [98.3 F (36.8 C)-98.7 F (37.1 C)] 98.5 F (36.9 C) (06/14 0003) Pulse Rate:  [66-88] 86 (06/14 0551) Resp:  [17-25] 18 (06/14 0551) BP: (84-110)/(33-68) 106/50 mmHg (06/14 0551) SpO2:  [91 %-97 %] 96 % (06/14 0551) FiO2 (%):  [40 %] 40 % (06/14 0335) Weight:  [98.431 kg (217 lb)-99.791 kg (220 lb)] 98.431 kg (217  lb) (06/14 0551)  HEMODYNAMICS: CVP:  [8 mmHg-33 mmHg] 8 mmHg  VENTILATOR SETTINGS: Vent Mode:  [-] PRVC FiO2 (%):  [40 %] 40 % Set Rate:  [20 bmp] 20 bmp Vt Set:  [400 mL] 400 mL PEEP:  [5 cmH20] 5 cmH20 Plateau Pressure:  [20 cmH20-29 cmH20] 29 cmH20  INTAKE / OUTPUT: Intake/Output     06/13 0701 - 06/14 0700   I.V. (mL/kg) 10 (0.1)   NG/GT 450   IV Piggyback 300   Total Intake(mL/kg) 760 (7.7)   Urine (mL/kg/hr) 700 (0.3)   Total Output 700   Net +60         PHYSICAL EXAMINATION: General:  Resting, no distress Neuro:  Awake, alert HEENT:  Tracheostomy clean Cardiovascular:  Regular, no murmurs Lungs:  Few wheezes / rhonchi Abdomen:  Obese, soft, bowel sounds present Musculoskeletal:  No edema Skin:  No rash  LABS: CBC  Recent Labs Lab 05/02/14 1556 05/03/14 0530 05/05/14 0627  WBC 11.2* 9.0 11.8*  HGB 9.5* 9.2* 8.7*  HCT 31.8* 30.9* 29.4*  PLT 327 346 305   Coag's No results found for this basename: APTT, INR,  in the last 168 hours  BMET  Recent Labs Lab 05/02/14 0430 05/03/14 0530 05/05/14 0627  NA 148* 142 138  K 2.9* 4.8 4.7  CL 110 96 94*  CO2 27 39* 36*  BUN 20 28* 40*  CREATININE 0.23* 0.32* 0.41*  GLUCOSE 98 148* 127*   Electrolytes  Recent Labs Lab  05/02/14 0430 05/03/14 0530 05/05/14 0627  CALCIUM 5.7* 9.3 9.0   Sepsis Markers No results found for this basename: LATICACIDVEN, PROCALCITON, O2SATVEN,  in the last 168 hours ABG No results found for this basename: PHART, PCO2ART, PO2ART,  in the last 168 hours Liver Enzymes No results found for this basename: AST, ALT, ALKPHOS, BILITOT, ALBUMIN,  in the last 168 hours Cardiac Enzymes No results found for this basename: TROPONINI, PROBNP,  in the last 168 hours Glucose  Recent Labs Lab 05/04/14 0027 05/04/14 0431 05/04/14 0758 05/04/14 1151 05/05/14 2028 05/06/14 0003  GLUCAP 117* 114* 160* 156* 143* 101*   IMAGING: No results found.  ASSESSMENT /  PLAN:  PULMONARY A: Acute on chronic respiratory failure Respiratory muscle weakness secondary to cord compression / new T6 paraplegia  COPD with possible exacerbation Suspect OSA / OHS Tracheostomy status P:    Goal SpO2>92 Wean on CPAP / PS as tolerated Awaiting chronic vent facility placement DuoNeb / Albuterol / Pulmicort Prednisone 10 (chronic, preadmission ) SLP  CARDIOVASCULAR A:  Chronic diastolic heart failure P:  No intervention required   RENAL A:   Decreased urine output Hypovolemia? P:   Trend BMP Free water to cont   GASTROINTESTINAL A:   Nutrition GERD P:   TF Protonix   HEMATOLOGIC A:   Anemia, no overt hemorrhage, stable post transfusion VTE Px P:  Trend CBC SCD  INFECTIOUS A:   Bacteremia with COAG NEG STAPH 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, 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 Endocarditis unlikely, negative TTE Allergy PCN / cephalosporins Early stage II decubitus sacrum P:  D/c vancomycin  ENDOCRINE  A:   Hyperglycemia Hypothyroidism P:   SSI Synthroid TSH check 7/2  NEUROLOGIC A:   T6 cord compression / paraplegia, s/p spinal cord decompression, still no LE movement Delirium, resolved Anxiety P:   Vicodin / Dilauded Ativan PRN Celexa, Xanax as preadmission PT   I have personally obtained history, examined patient, evaluated and interpreted laboratory and imaging results, reviewed medical records, formulated assessment / plan and placed orders.  Asencion Noble, MD Pulmonary and [place] Pager: [phone]  05/06/2014, 6:18 AM

## 2014-05-07 LAB — MRSA PCR SCREENING: MRSA by PCR: POSITIVE — AB

## 2014-05-07 LAB — GLUCOSE, CAPILLARY
GLUCOSE-CAPILLARY: 110 mg/dL — AB (ref 70–99)
GLUCOSE-CAPILLARY: 136 mg/dL — AB (ref 70–99)
GLUCOSE-CAPILLARY: 137 mg/dL — AB (ref 70–99)
GLUCOSE-CAPILLARY: 149 mg/dL — AB (ref 70–99)
GLUCOSE-CAPILLARY: 154 mg/dL — AB (ref 70–99)
GLUCOSE-CAPILLARY: 200 mg/dL — AB (ref 70–99)
Glucose-Capillary: 139 mg/dL — ABNORMAL HIGH (ref 70–99)
Glucose-Capillary: 152 mg/dL — ABNORMAL HIGH (ref 70–99)
Glucose-Capillary: 145 mg/dL — ABNORMAL HIGH (ref 70–99)
Glucose-Capillary: 147 mg/dL — ABNORMAL HIGH (ref 70–99)
Glucose-Capillary: 166 mg/dL — ABNORMAL HIGH (ref 70–99)
Glucose-Capillary: 176 mg/dL — ABNORMAL HIGH (ref 70–99)
Glucose-Capillary: 194 mg/dL — ABNORMAL HIGH (ref 70–99)
Glucose-Capillary: 207 mg/dL — ABNORMAL HIGH (ref 70–99)
Glucose-Capillary: 194 mg/dL — ABNORMAL HIGH (ref 70–99)

## 2014-05-07 LAB — CBC
HCT: 28.9 % — ABNORMAL LOW (ref 36.0–46.0)
Hemoglobin: 8.2 g/dL — ABNORMAL LOW (ref 12.0–15.0)
MCH: 25.7 pg — ABNORMAL LOW (ref 26.0–34.0)
MCHC: 28.4 g/dL — ABNORMAL LOW (ref 30.0–36.0)
MCV: 90.6 fL (ref 78.0–100.0)
PLATELETS: 291 10*3/uL (ref 150–400)
RBC: 3.19 MIL/uL — AB (ref 3.87–5.11)
RDW: 20.5 % — ABNORMAL HIGH (ref 11.5–15.5)
WBC: 11.8 10*3/uL — AB (ref 4.0–10.5)

## 2014-05-07 LAB — BASIC METABOLIC PANEL
BUN: 35 mg/dL — ABNORMAL HIGH (ref 6–23)
CALCIUM: 9.2 mg/dL (ref 8.4–10.5)
CO2: 36 mEq/L — ABNORMAL HIGH (ref 19–32)
Chloride: 98 mEq/L (ref 96–112)
Creatinine, Ser: 0.36 mg/dL — ABNORMAL LOW (ref 0.50–1.10)
GLUCOSE: 135 mg/dL — AB (ref 70–99)
Potassium: 4.8 mEq/L (ref 3.7–5.3)
SODIUM: 143 meq/L (ref 137–147)

## 2014-05-07 LAB — LACTIC ACID, PLASMA: LACTIC ACID, VENOUS: 0.7 mmol/L (ref 0.5–2.2)

## 2014-05-07 MED ORDER — SODIUM CHLORIDE 0.9 % IV BOLUS (SEPSIS)
500.0000 mL | Freq: Once | INTRAVENOUS | Status: AC
  Administered 2014-05-07: 500 mL via INTRAVENOUS

## 2014-05-07 MED ORDER — NOREPINEPHRINE BITARTRATE 1 MG/ML IV SOLN
2.0000 ug/min | INTRAVENOUS | Status: DC
  Administered 2014-05-07: 2 ug/min via INTRAVENOUS
  Filled 2014-05-07 (×2): qty 4

## 2014-05-07 NOTE — Progress Notes (Signed)
Pt's husband bringing life insurance policy paperwork, which CSW will send to vent SNFs for insurance authorization. This must be done before pt can go to facility--husband states he will bring papers this afternoon/evening. CSW following and will continue to assist with discharge planning to vent SNF in [place].   Ky Barban, MSW, [facility] Clinical Social Worker [phone]

## 2014-05-07 NOTE — Progress Notes (Signed)
PT transported from 2600 without event.

## 2014-05-07 NOTE — Progress Notes (Signed)
Report called to RN on 30M. Updated on patient history, current status, and plan of care.  Patient is able to transfer at this time.  Will be accompanied by RN, NT, and RT.

## 2014-05-07 NOTE — Progress Notes (Signed)
Attempted to call report to 61M.  No assignment has been made for RN staff at this time.  Will continue to monitor patient and return call shortly.

## 2014-05-07 NOTE — Progress Notes (Signed)
PULMONARY / CRITICAL CARE MEDICINE  Name: Joanna Perez MRN: [id number] DOB: 1956/11/08    ADMISSION DATE:  04/07/2014 CONSULTATION DATE:  04/09/2014  REFERRING MD :  Dr. Annette Stable PRIMARY SERVICE: PCCM  CHIEF COMPLAINT:  Paraplegia   BRIEF PATIENT DESCRIPTION: 58 yo with COPD, CHF, DM, CAD s/p MI, atrial fibrillation transferred from [facility] for BLE paraplegia, found to have T6 fracture with spinal stenosis and spinal cord compression on MRI. She was taken for decompressive surgery and remained on mechanical ventilation post surgery.   SIGNIFICANT EVENTS / STUDIES:  5/16 - Admitted to [facility], hypotensive and with new paraplegia 5/17 - Transfer to [facility] 5/17 - MR Cervical/Thoracic/Lumbar Spine - worsened T6 compression fracture with paraspinal and epidural hematoma causing cord compression and edema, possible early cord infarct 5/18 - Intubation for procedure 5/18 - Thoracic laminectomy 5/18 - Extubated 5/19 - Somnolent / hypercarbic >>> reintubated 5/26 - Tracheostomy 5/28 - TTE >>> Ef 65%, vegetation is not reported 6/04 -  G-tube placement 6/10 - Transfused PRBC 6/15 - No acute events, remains on full support  LINES / TUBES: ETT 5/18 >>> 5/18; 5/19 >>>5/26 R R AL 5/17 >>> out Foley 5/16 >>> R PICC 5/22 >>> 5/28 Trach 5/26 (df) >>> PEG 6/4 >>>  CULTURES: 5/16  Blood >>>  COAG NEG STAPH 5/16  Urine >>> neg 5/25 Sputum >  STENOTROPHOMONAS MALTOPHILIA 5/26  Sputum >>> STENOTROPHOMONAS MALTOPHILIA 5/26  Blood >>> COAG NEG STAPH 5/30  Blood  >>> neg  ANTIBIOTICS per ID: Vancomycin 5/17 >>>6/13 Levaquin 5/17 >>> 5/19 Imipenem 5/28 >>> 6/1  INTERVAL HISTORY: No acute events, not weaning on vent.    VITAL SIGNS: Temp:  [98.1 F (36.7 C)-98.9 F (37.2 C)] 98.6 F (37 C) (06/15 1207) Pulse Rate:  [71-95] 79 (06/15 1207) Resp:  [16-24] 21 (06/15 1207) BP: (91-106)/(41-65) 94/45 mmHg (06/15 1207) SpO2:  [93 %-99 %] 94 % (06/15 1207) FiO2 (%):  [40 %] 40 %  (06/15 1207) Weight:  [215 lb (97.523 kg)] 215 lb (97.523 kg) (06/15 0320)  HEMODYNAMICS: CVP:  [10 mmHg] 10 mmHg  VENTILATOR SETTINGS: Vent Mode:  [-] PRVC FiO2 (%):  [40 %] 40 % Set Rate:  [20 bmp] 20 bmp Vt Set:  [400 mL] 400 mL PEEP:  [5 cmH20] 5 cmH20 Plateau Pressure:  [22 cmH20-33 cmH20] 24 cmH20  INTAKE / OUTPUT: Intake/Output     06/14 0701 - 06/15 0700 06/15 0701 - 06/16 0700   I.V. (mL/kg) 30 (0.3) 30 (0.3)   NG/GT 1975 75   IV Piggyback     Total Intake(mL/kg) 2005 (20.6) 105 (1.1)   Urine (mL/kg/hr) 2050 (0.9) 400 (0.7)   Total Output 2050 400   Net -45 -295        Stool Occurrence  1 x     PHYSICAL EXAMINATION: General:  Resting, no distress Neuro:  Awake, alert HEENT:  Tracheostomy clean Cardiovascular:  Regular, no murmurs Lungs:  resp's even/non-labored on vent, lungs bilaterally with few scattered rhonchi Abdomen:  Obese, soft, bowel sounds present Musculoskeletal:  No edema Skin:  No rash  LABS: CBC  Recent Labs Lab 05/02/14 1556 05/03/14 0530 05/05/14 0627  WBC 11.2* 9.0 11.8*  HGB 9.5* 9.2* 8.7*  HCT 31.8* 30.9* 29.4*  PLT 327 346 305   BMET  Recent Labs Lab 05/02/14 0430 05/03/14 0530 05/05/14 0627  NA 148* 142 138  K 2.9* 4.8 4.7  CL 110 96 94*  CO2 27 39* 36*  BUN 20 28* 40*  CREATININE 0.23* 0.32* 0.41*  GLUCOSE 98 148* 127*   IMAGING: No results found.  ASSESSMENT / PLAN:  PULMONARY A: Acute on chronic respiratory failure Respiratory muscle weakness secondary to cord compression / new T6 paraplegia  COPD with possible exacerbation Suspect OSA / OHS Tracheostomy status P:    Goal SpO2>92 Wean on CPAP / PS as tolerated Awaiting chronic vent facility placement DuoNeb / Albuterol / Pulmicort Prednisone 10 (chronic, preadmission ) SLP  CARDIOVASCULAR A:  Chronic diastolic heart failure P:  Monitor I/O's D/C CVP's 6/15  RENAL A:   Decreased urine output Hypovolemia? P:   Trend BMP Free water 200 ml  Q6  GASTROINTESTINAL A:   Nutrition GERD P:   TF Protonix   HEMATOLOGIC A:   Anemia - no overt hemorrhage, stable post transfusion VTE Px P:  Trend CBC SCD  INFECTIOUS A:   Bacteremia with COAG NEG STAPH STENOTROPHOMONAS MALTOPHILIA - in sputum, 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 Endocarditis unlikely, negative TTE Allergy PCN / cephalosporins Early stage II decubitus sacrum P:  Monitor off abx  ENDOCRINE  A:   Hyperglycemia Hypothyroidism P:   SSI Synthroid TSH check 7/2  NEUROLOGIC A:   T6 cord compression / paraplegia, s/p spinal cord decompression, still no LE movement Delirium, resolved Anxiety P:   Vicodin / Dilauded Ativan PRN Celexa, Xanax as preadmission PT  GLOBAL: Await Vent SNF bed approval in [place].   Noe Gens, NP-C [place] [facility] Pgr: [phone] or [phone]   I have personally obtained history, examined patient, evaluated and interpreted laboratory and imaging results, reviewed medical records, formulated assessment / plan and placed orders.  Merton Border, MD ; [facility] [phone].  After 5:30 PM or weekends, call [phone]  05/07/2014, 1:16 PM

## 2014-05-07 NOTE — Progress Notes (Signed)
eLink Physician-Brief Progress Note Patient Name: Joanna Perez DOB: 04/04/1956 MRN: [id number]  Date of Service  05/07/2014   HPI/Events of Note   Pt in shock state  eICU Interventions  Start norepi, tfr to ICU bed chk lactic acid, PCT, bnp, labs   Intervention Category Major Interventions: Hypotension - evaluation and management  Asencion Noble 05/07/2014, 6:09 PM

## 2014-05-07 NOTE — Progress Notes (Signed)
Physical Therapy Treatment Patient Details Name: Joanna Perez MRN: [id number] DOB: 26-Oct-1956 Today's Date: 05/07/2014    History of Present Illness 58 yo  transferred from [facility] (adm s/p fall) for BLE paraplegia, found to have T6 fracture with spinal stenosis and spinal cord compression on MRI. She was taken for decompressive surgery (T5-8) and remained on mechanical ventilation post surgery. Pt now s/p trach and has been unable to wean off vent. Peg 04/26/14 PMHx- with COPD, CHF, DM, CAD s/p MI, atrial fibrillation; Pt on home 4L 02.    PT Comments    Session limited by stool incontinence. Pt with good rehab effort to roll for peri-care, but was very fatigued after she was cleaned and was asleep before therapist exited room. O2 sats remained >90% throughout session on the vent. Will continue to follow and progress as able per POC.   Follow Up Recommendations  SNF;Supervision/Assistance - 24 hour     Equipment Recommendations  None recommended by PT    Recommendations for Other Services       Precautions / Restrictions Precautions Precautions: Fall Precaution Comments: bil pillow prafo boots,  Restrictions Weight Bearing Restrictions: No    Mobility  Bed Mobility Overal bed mobility: +2 for physical assistance;Needs Assistance Bed Mobility: Rolling Rolling: Total assist;+2 for physical assistance         General bed mobility comments: VC's for hand placement on bed rails for support. Occasional hand-over-hand assist required for pt to reach railings. Total assist to roll pt fully to the side. Bilateral rolling for peri-care as pt was incontinent of stool in the bed. Sacral dressing replaced during this time with NT in the room.   Transfers                    Ambulation/Gait                 Stairs            Wheelchair Mobility    Modified Rankin (Stroke Patients Only)       Balance                                     Cognition Arousal/Alertness: Awake/alert Behavior During Therapy: Anxious Overall Cognitive Status: Difficult to assess                      Exercises      General Comments        Pertinent Vitals/Pain Vitals stable throughout session.     Home Living                      Prior Function            PT Goals (current goals can now be found in the care plan section) Acute Rehab PT Goals Patient Stated Goal: none stated at this time PT Goal Formulation: With patient Time For Goal Achievement: 05/10/14 Potential to Achieve Goals: Good Progress towards PT goals: Progressing toward goals    Frequency  Min 2X/week    PT Plan Current plan remains appropriate    Co-evaluation             End of Session Equipment Utilized During Treatment: Oxygen Activity Tolerance: Patient limited by pain;Patient limited by fatigue Patient left: in bed;with call bell/phone within reach     Time: 9470-9628 PT Time Calculation (  min): 31 min  Charges:  $Therapeutic Activity: 23-37 mins                    G Codes:      Jolyn Lent 20-May-2014, 1:23 PM  Jolyn Lent, PT, DPT Acute Rehabilitation Services Pager: [phone]

## 2014-05-07 NOTE — Progress Notes (Signed)
eLink Physician-Brief Progress Note Patient Name: Joanna Perez DOB: January 13, 1956 MRN: [id number]  Date of Service  05/07/2014   HPI/Events of Note   hypotensive  eICU Interventions  NS bolus given   Intervention Category Major Interventions: Hypotension - evaluation and management  Asencion Noble 05/07/2014, 4:55 PM

## 2014-05-08 ENCOUNTER — Inpatient Hospital Stay (HOSPITAL_COMMUNITY): Payer: Medicaid Other

## 2014-05-08 DIAGNOSIS — R579 Shock, unspecified: Secondary | ICD-10-CM

## 2014-05-08 LAB — GLUCOSE, CAPILLARY
GLUCOSE-CAPILLARY: 125 mg/dL — AB (ref 70–99)
GLUCOSE-CAPILLARY: 143 mg/dL — AB (ref 70–99)
GLUCOSE-CAPILLARY: 156 mg/dL — AB (ref 70–99)
GLUCOSE-CAPILLARY: 166 mg/dL — AB (ref 70–99)
Glucose-Capillary: 114 mg/dL — ABNORMAL HIGH (ref 70–99)
Glucose-Capillary: 115 mg/dL — ABNORMAL HIGH (ref 70–99)
Glucose-Capillary: 198 mg/dL — ABNORMAL HIGH (ref 70–99)

## 2014-05-08 LAB — BASIC METABOLIC PANEL
BUN: 33 mg/dL — ABNORMAL HIGH (ref 6–23)
CALCIUM: 9 mg/dL (ref 8.4–10.5)
CO2: 35 mEq/L — ABNORMAL HIGH (ref 19–32)
CREATININE: 0.32 mg/dL — AB (ref 0.50–1.10)
Chloride: 101 mEq/L (ref 96–112)
GFR calc Af Amer: >90 mL/min (ref >90)
GFR calc non Af Amer: >90 mL/min (ref >90)
Glucose, Bld: 176 mg/dL — ABNORMAL HIGH (ref 70–99)
Potassium: 4.8 mEq/L (ref 3.7–5.3)
Sodium: 145 mEq/L (ref 137–147)

## 2014-05-08 LAB — CBC
HEMATOCRIT: 29.9 % — AB (ref 36.0–46.0)
Hemoglobin: 8.5 g/dL — ABNORMAL LOW (ref 12.0–15.0)
MCH: 25.7 pg — ABNORMAL LOW (ref 26.0–34.0)
MCHC: 28.4 g/dL — ABNORMAL LOW (ref 30.0–36.0)
MCV: 90.3 fL (ref 78.0–100.0)
PLATELETS: 317 10*3/uL (ref 150–400)
RBC: 3.31 MIL/uL — ABNORMAL LOW (ref 3.87–5.11)
RDW: 20.5 % — AB (ref 11.5–15.5)
WBC: 13.3 10*3/uL — AB (ref 4.0–10.5)

## 2014-05-08 LAB — CLOSTRIDIUM DIFFICILE BY PCR: Toxigenic C. Difficile by PCR: NEGATIVE

## 2014-05-08 MED ORDER — HYDROCORTISONE SOD SUCCINATE 100 MG IJ SOLR
50.0000 mg | Freq: Four times a day (QID) | INTRAMUSCULAR | Status: DC
  Administered 2014-05-08 – 2014-05-11 (×12): 50 mg via INTRAVENOUS
  Filled 2014-05-08 (×16): qty 1

## 2014-05-08 MED ORDER — PRO-STAT SUGAR FREE PO LIQD
30.0000 mL | Freq: Every day | ORAL | Status: DC
  Administered 2014-05-08 – 2014-05-09 (×2): 30 mL
  Administered 2014-05-10: 10:00:00
  Administered 2014-05-11 – 2014-05-16 (×6): 30 mL
  Filled 2014-05-08 (×9): qty 30

## 2014-05-08 MED ORDER — SODIUM CHLORIDE 0.9 % IV SOLN
INTRAVENOUS | Status: DC
  Administered 2014-05-08 – 2014-05-10 (×5): via INTRAVENOUS

## 2014-05-08 MED ORDER — SODIUM CHLORIDE 0.9 % IV BOLUS (SEPSIS)
500.0000 mL | Freq: Once | INTRAVENOUS | Status: AC
  Administered 2014-05-08: 500 mL via INTRAVENOUS

## 2014-05-08 MED ORDER — CHLORHEXIDINE GLUCONATE CLOTH 2 % EX PADS
6.0000 | MEDICATED_PAD | Freq: Every day | CUTANEOUS | Status: AC
  Administered 2014-05-08 – 2014-05-12 (×5): 6 via TOPICAL

## 2014-05-08 MED ORDER — FENTANYL CITRATE 0.05 MG/ML IJ SOLN
50.0000 ug | INTRAMUSCULAR | Status: DC | PRN
  Administered 2014-05-09 – 2014-05-18 (×41): 100 ug via INTRAVENOUS
  Filled 2014-05-08 (×43): qty 2

## 2014-05-08 MED ORDER — HYDROCODONE-ACETAMINOPHEN 5-325 MG PO TABS
1.0000 | ORAL_TABLET | ORAL | Status: DC | PRN
  Administered 2014-05-08 – 2014-05-18 (×28): 1 via ORAL
  Filled 2014-05-08: qty 1
  Filled 2014-05-08: qty 2
  Filled 2014-05-08 (×26): qty 1

## 2014-05-08 MED ORDER — MUPIROCIN 2 % EX OINT
1.0000 "application " | TOPICAL_OINTMENT | Freq: Two times a day (BID) | CUTANEOUS | Status: AC
  Administered 2014-05-08 – 2014-05-12 (×10): 1 via NASAL
  Filled 2014-05-08 (×2): qty 22

## 2014-05-08 NOTE — Progress Notes (Signed)
OT Cancellation Note  Patient Details Name: Joanna Perez MRN: [id number] DOB: [birth year]   Cancelled Treatment:    Reason Eval/Treat Not Completed: Patient not medically ready. Ot to hold treatment this AM and to check back later today for appropriateness. Pt transferring from [facility].  Peri Maris Pager: [phone]  05/08/2014, 10:45 AM

## 2014-05-08 NOTE — Progress Notes (Signed)
CSW sent clinicals for life insurance policy to [facility] in [place], [place] at facility request so they can switch [place] [employer] to [facility] for vent SNF stay. Pt transferred to 5M. Unit CSW provided a handoff, and this CSW is signing off.   Ky Barban, MSW, [place] Clinical Social Worker

## 2014-05-08 NOTE — Progress Notes (Signed)
NUTRITION FOLLOW UP  DOCUMENTATION CODES Per approved criteria  -Obesity Unspecified   INTERVENTION: Continue Vital High Protein formula at 50 ml/hr  Add Prostat liquid protein 30 ml via tube Total TF regimen to provide 1300 kcals (65% of re-estimated kcal needs), 120 gm protein (100% of re-estimated protein needs), 1003 ml of free water RD to follow for nutrition care plan  NUTRITION DIAGNOSIS: Inadequate oral intake related to inability to eat as evidenced by NPO status, ongoing  Goal: Enteral nutrition to provide 60-70% of estimated calorie needs (22-25 kcals/kg ideal body weight) and 100% of estimated protein needs, based on ASPEN guidelines for permissive underfeeding in critically ill obese individuals, met  Monitor:  TF regimen & tolerance, respiratory status, weight, labs, I/O's  ASSESSMENT: Pt admitted with altered mental status and COPD exacerbation. Pt is status post thoracic decompressive surgery for severe myelopathy with subacute paraplegia on 5/18. Per MD note prognosis for neurological recovery is very poor.   Patient s/p procedure 6/4: IR GASTROSTOMY TUBE   Patient transferred from 2C-Stepdown to 37M-Medical ICU 6/15 for vasopressors.  Patient is currently on ventilator support via trach MV: 7.5 L/min Temp (24hrs), Avg:99.6 F (37.6 C), Min:98.3 F (36.8 C), Max:102.2 F (39 C)   Vital HP formula continues to infuse at 50 ml/hr via G-tube providing 1200 kcal (24 kcal/kg IBW), 105 grams of protein (100% of estimated protein needs) and 1003 ml of water.  Free water flushes at 200 ml every 6 hours.  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  Height: Ht Readings from Last 1 Encounters:  04/09/14 $RemoveB'5\' 2"'iDlhmYUg$  (1.575 m)    Weight -----> fluctuating but stable Wt Readings from Last 1 Encounters:   05/08/14 210 lb 15.7 oz (95.7 kg)    6/15  215 lb 6/14  217 lb 6/13  220 lb 6/12  220 lb 6/11  214 lb 6/10  214 lb 6/09  209 lb 6/07  208 lb 6/06  205 lb 6/05  208 lb 6/04  200 lb 6/03  202 lb 6/02  207 lb 6/01  210 lb  BMI:  Body mass index is 38.58 kg/(m^2).  Re-estimated needs: Kcal: 1972 Protein: 115-125 gm Fluid: per MD   Skin: L buttock partial thickness Stage II tissue injury consistent with IAD; R buttock & ischial tuberosity ulceration  Diet Order: NPO   Intake/Output Summary (Last 24 hours) at 05/08/14 1408 Last data filed at 05/08/14 1300  Gross per 24 hour  Intake   1940 ml  Output   1750 ml  Net    190 ml    Labs:   Recent Labs Lab 05/05/14 0627 05/07/14 2304 05/08/14 0500  NA 138 143 145  K 4.7 4.8 4.8  CL 94* 98 101  CO2 36* 36* 35*  BUN 40* 35* 33*  CREATININE 0.41* 0.36* 0.32*  CALCIUM 9.0 9.2 9.0  GLUCOSE 127* 135* 176*    CBG (last 3)   Recent Labs  05/08/14 0330 05/08/14 0808 05/08/14 1141  GLUCAP 156* 114* 115*    Scheduled Meds: . ALPRAZolam  1 mg Oral TID  . antiseptic oral rinse  15 mL Mouth Rinse QID  . budesonide (PULMICORT) nebulizer solution  0.5 mg Nebulization BID  . chlorhexidine  15 mL Mouth Rinse BID  . Chlorhexidine Gluconate Cloth  6 each Topical Q0600  . cholecalciferol  400  Units Oral Daily  . citalopram  20 mg Oral Daily  . free water  200 mL Per Tube Q6H  . hydrocortisone sodium succinate  50 mg Intravenous Q6H  . insulin aspart  0-15 Units Subcutaneous 6 times per day  . ipratropium-albuterol  3 mL Nebulization BID  . levothyroxine  137 mcg Oral QAC breakfast  . mupirocin ointment  1 application Nasal BID  . pantoprazole sodium  40 mg Per Tube Daily  . potassium chloride  20 mEq Per Tube Daily  . senna  1 tablet Oral BID  . sodium chloride  10-40 mL Intracatheter Q12H    Continuous Infusions: . sodium chloride 100 mL/hr at 05/08/14 1236  . feeding supplement (VITAL HIGH PROTEIN) 1,000 mL  (05/08/14 1236)    Arthur Holms, RD, LDN Pager #: [phone] After-Hours Pager #: [phone]

## 2014-05-08 NOTE — Progress Notes (Signed)
Occupational Therapy Treatment Patient Details Name: Joanna Perez MRN: [id number] DOB: September 07, 1956 Today's Date: 05/08/2014    History of present illness 58 yo  transferred from [facility] (adm s/p fall) for BLE paraplegia, found to have T6 fracture with spinal stenosis and spinal cord compression on MRI. She was taken for decompressive surgery (T5-8) and remained on mechanical ventilation post surgery. Pt now s/p trach and has been unable to wean off vent. Peg 04/26/14 PMHx- with COPD, CHF, DM, CAD s/p MI, atrial fibrillation; Pt on home 4L 02 .   OT comments  Pt progressed to EOB this session and very pleasant. Pt provided clipboard with pen on 55m unit to help with communication. Pt is left hand dominant. Pt very grateful for Ot visit and tolerated 30 minutes of full activity.    Follow Up Recommendations  SNF;Supervision/Assistance - 24 hour    Equipment Recommendations  Wheelchair (measurements OT);Wheelchair cushion (measurements OT);Hospital bed    Recommendations for Other Services      Precautions / Restrictions Precautions Precautions: Fall Precaution Comments: bil pillow prafo boots,  Restrictions Weight Bearing Restrictions: Yes       Mobility Bed Mobility Overal bed mobility: +2 for physical assistance;Needs Assistance;+ 2 for safety/equipment Bed Mobility: Supine to Sit;Sit to Supine     Supine to sit: Total assist;+2 for physical assistance;+2 for safety/equipment Sit to supine: Total assist;+2 for physical assistance   General bed mobility comments: pt reaching with RT UE for rail to (A).   Transfers                      Balance Overall balance assessment: Needs assistance Sitting-balance support: Bilateral upper extremity supported;Feet supported Sitting balance-Leahy Scale: Zero   Postural control: Posterior lean                         ADL   Eating/Feeding: NPO   Grooming: Maximal assistance;Bed level       Lower Body  Bathing: Total assistance;+2 for physical assistance;Bed level                         General ADL Comments: OT provided AAROM to UE and PROM bil LE supine (see below) pt agreeable to EOB. Pt total +2 total (A) to progress to EOB. Pt unable to control head and head in a flexed position. pt less responsive so OT returning supine. pt immediately responding to therapist. Pt states "i can't hold my head" Pt will require total +3 at this time to complete eob sitting. Pt with a posterior pelvic tilt and poor trunk control.       Vision                     Perception     Praxis      Cognition   Behavior During Therapy: Anxious Overall Cognitive Status: Difficult to assess                  General Comments: Pt writing with Lt UE on clipboard for communication. pt mouthing words to therapist. Pt producing sound over trach to say "thank you love you" and shocked by own voice.pt states "was that me?"     Extremity/Trunk Assessment               Exercises General Exercises - Upper Extremity Shoulder Flexion: AAROM;Both;Supine;10 reps Shoulder ABduction: AAROM;Both;Supine;10 reps Elbow Flexion: AAROM;Both;Supine;10 reps  General Exercises - Lower Extremity Ankle Circles/Pumps: PROM;Both;5 reps;Supine Hip ABduction/ADduction: PROM;Both;10 reps;Supine Straight Leg Raises: PROM;Both;10 reps;Supine Hip Flexion/Marching: PROM;Both;10 reps;Supine   Shoulder Instructions       General Comments      Pertinent Vitals/ Pain       Vent 40%        PEEP 5 and 400 volume\   Home Living                                          Prior Functioning/Environment              Frequency Min 2X/week     Progress Toward Goals  OT Goals(current goals can now be found in the care plan section)  Progress towards OT goals: Progressing toward goals  Acute Rehab OT Goals Patient Stated Goal: none stated at this time OT Goal Formulation: With  patient Time For Goal Achievement: 05/17/14 Potential to Achieve Goals: Fair ADL Goals Pt Will Perform Grooming: with min guard assist;bed level Pt Will Perform Upper Body Bathing: with min assist;bed level Pt/caregiver will Perform Home Exercise Program: Increased strength;Both right and left upper extremity;With theraband;With minimal assist Additional ADL Goal #1: Pt will sit EOB max (A) using BIL UE for support  Additional ADL Goal #2: Pt will tolerate EOB sitting for 5 minutes with VSS  Plan Discharge plan remains appropriate    Co-evaluation                 End of Session     Activity Tolerance Patient tolerated treatment well   Patient Left in bed;with call bell/phone within reach   Nurse Communication Mobility status;Precautions        Time: 7253-6644 OT Time Calculation (min): 34 min  Charges: OT General Charges $OT Visit: 1 Procedure OT Treatments $Therapeutic Activity: 23-37 mins  Parke Poisson B 05/08/2014, 3:56 PM Pager: [phone]

## 2014-05-08 NOTE — Progress Notes (Signed)
PULMONARY / CRITICAL CARE MEDICINE  Name: Joanna Perez MRN: [id number] DOB: 09-Jan-1956    ADMISSION DATE:  04/07/2014 CONSULTATION DATE:  04/09/2014  REFERRING MD :  Dr. Annette Stable PRIMARY SERVICE:  PCCM  CHIEF COMPLAINT:  Paraplegia   BRIEF PATIENT DESCRIPTION: 58 yo with COPD, CHF, DM, CAD s/p MI, atrial fibrillation transferred from [facility] for BLE paraplegia, found to have T6 fracture with spinal stenosis and spinal cord compression on MRI. She was taken for decompressive surgery and remained on mechanical ventilation post surgery.   SIGNIFICANT EVENTS / STUDIES:  5/16  Admitted to [facility], hypotensive and with new paraplegia 5/17  Transfer to [facility] 5/17  MR Cervical/Thoracic/Lumbar Spine - worsened T6 compression fracture with paraspinal and epidural hematoma causing cord compression and edema, possible early cord infarct 5/18  Intubation for procedure 5/18  Thoracic laminectomy 5/18  Extubated 5/19  Somnolent / hypercarbic >>> reintubated 5/26  Tracheostomy 5/28  TTE >>> Ef 65%, vegetation is not reported 6/04  G-tube placement 6/10  Transfused PRBC 6/15  Transferred to ICU for vasopressors  LINES / TUBES: ETT 5/18 >>> 5/18; 5/19 >>>5/26 R R AL 5/17 >>> out Foley 5/16 >>> R PICC 5/22 >>> 5/28 Trach 5/26 (DF) >>> PEG 6/4 >>>  CULTURES: 5/16  Blood >>>  COAG NEG STAPH 5/16  Urine >>> neg 5/25 Sputum >  STENOTROPHOMONAS MALTOPHILIA 5/26  Sputum >>> STENOTROPHOMONAS MALTOPHILIA 5/26  Blood >>> COAG NEG STAPH 5/30  Blood  >>> neg  ANTIBIOTICS per ID: Vancomycin 5/17 >>>6/13 Levaquin 5/17 >>> 5/19 Imipenem 5/28 >>> 6/1  INTERVAL HISTORY: On minimal vasopressors  VITAL SIGNS: Temp:  [98.3 F (36.8 C)-102.2 F (39 C)] 102.2 F (39 C) (06/16 0843) Pulse Rate:  [62-94] 90 (06/16 0900) Resp:  [10-24] 10 (06/16 0900) BP: (84-139)/(19-81) 117/44 mmHg (06/16 0900) SpO2:  [91 %-100 %] 94 % (06/16 0900) FiO2 (%):  [40 %] 40 % (06/16 0843) Weight:  [95.7 kg  (210 lb 15.7 oz)] 95.7 kg (210 lb 15.7 oz) (06/16 0500)  HEMODYNAMICS:    VENTILATOR SETTINGS: Vent Mode:  [-] PRVC FiO2 (%):  [40 %] 40 % Set Rate:  [20 bmp] 20 bmp Vt Set:  [400 mL] 400 mL PEEP:  [5 cmH20] 5 cmH20 Plateau Pressure:  [24 cmH20-29 cmH20] 24 cmH20  INTAKE / OUTPUT: Intake/Output     06/15 0701 - 06/16 0700 06/16 0701 - 06/17 0700   I.V. (mL/kg) 430 (4.5) 50 (0.5)   Other 420 100   NG/GT 1375    Total Intake(mL/kg) 2225 (23.3) 150 (1.6)   Urine (mL/kg/hr) 1450 (0.6) 450 (1.1)   Total Output 1450 450   Net +775 -300        Stool Occurrence 2 x      PHYSICAL EXAMINATION: General:  No distress Neuro:  Asleep, but wakes up to stimulation HEENT:  Tracheostomy site intact Cardiovascular:  Regular, no murmurs Lungs:  Bilateral rhonchi Abdomen:  Obese, soft, bowel sounds present Musculoskeletal:  No edema Skin:  No rash  LABS: CBC  Recent Labs Lab 05/05/14 0627 05/07/14 2304 05/08/14 0500  WBC 11.8* 11.8* 13.3*  HGB 8.7* 8.2* 8.5*  HCT 29.4* 28.9* 29.9*  PLT 305 291 317   BMET  Recent Labs Lab 05/05/14 0627 05/07/14 2304 05/08/14 0500  NA 138 143 145  K 4.7 4.8 4.8  CL 94* 98 101  CO2 36* 36* 35*  BUN 40* 35* 33*  CREATININE 0.41* 0.36* 0.32*  GLUCOSE 127* 135*  176*   IMAGING: Dg Chest Port 1 View  05/08/2014   CLINICAL DATA:  Known airspace disease: Assess tracheostomy positioning  EXAM: PORTABLE CHEST - 1 VIEW  COMPARISON:  Portable chest x-ray of April 30, 2014  FINDINGS: The endotracheal tube tip lies at the level of the superior margin of the clavicular heads 3.7 cm above the crotch of the carina. The lungs are borderline hypoinflated. The interstitial markings remain increased but have improved since the previous study. The heart is top-normal in size. The central pulmonary vascularity remains prominent.  IMPRESSION: There has been slight interval improvement in the appearance of the pulmonary interstitium. The tracheostomy appliance is in  reasonable position.   Electronically Signed   By: David  Martinique   On: 05/08/2014 07:37   ASSESSMENT / PLAN:  PULMONARY A: Acute on chronic respiratory failure Respiratory muscle weakness secondary to cord compression / new T6 paraplegia  COPD with possible exacerbation Suspect OSA / OHS Tracheostomy status P:    Goal SpO2>92 Wean on CPAP / PS as tolerated Awaiting chronic vent facility placement DuoNeb / Albuterol / Pulmicort Discontinue Prednisone 10 (chronic, preadmission ) as started stress dose steroids SLP  CARDIOVASCULAR A:  Chronic diastolic heart failure Shock / hypotension, likely sedation related in setting of adrenal insufficiency, lactate 0.7 reassuring P:  Goal SBP>100 Levophed gtt, titrate to off  RENAL A:   Hypovolemia P:   Trend BMP Free water 200 ml q6h NS 500 x 1 NS@100   GASTROINTESTINAL A:   Nutrition GERD P:   TF Protonix   HEMATOLOGIC A:   Anemia VTE Px P:  Trend CBC SCD  INFECTIOUS A:   Bacteremia with COAG NEG STAPH STENOTROPHOMONAS MALTOPHILIA, 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 Endocarditis unlikely, negative TTE Allergy PCN / cephalosporins Early stage II decubitus sacrum P:  Monitor off abx  ENDOCRINE  A:   Hyperglycemia Hypothyroidism Suspect adrenal insufficiency as on chronic low dose steroids P:   SSI Synthroid TSH check 7/2 Start Hydrocortisone q6h  NEUROLOGIC A:   T6 cord compression / paraplegia, s/p spinal cord decompression, still no LE movement Delirium, resolved Anxiety P:   Vicodin D/c Dilaudid as possibly contributing to hypotension Fentanyl PRN Ativan PRN Celexa, Xanax as preadmission PT  I have personally obtained history, examined patient, evaluated and interpreted laboratory and imaging results, reviewed medical records, formulated assessment / plan and placed orders.  CRITICAL CARE:  The patient is critically ill with multiple organ systems failure and requires high complexity decision making for  assessment and support, frequent evaluation and titration of therapies, application of advanced monitoring technologies and extensive interpretation of multiple databases. Critical Care Time devoted to patient care services described in this note is 40 minutes.   Doree Fudge, MD Pulmonary and [place] Pager: [phone]  05/08/2014, 11:31 AM

## 2014-05-09 LAB — BASIC METABOLIC PANEL
BUN: 33 mg/dL — ABNORMAL HIGH (ref 6–23)
CO2: 35 meq/L — AB (ref 19–32)
Calcium: 9.1 mg/dL (ref 8.4–10.5)
Chloride: 101 mEq/L (ref 96–112)
Creatinine, Ser: 0.27 mg/dL — ABNORMAL LOW (ref 0.50–1.10)
GFR calc Af Amer: >90 mL/min (ref >90)
GFR calc non Af Amer: >90 mL/min (ref >90)
GLUCOSE: 210 mg/dL — AB (ref 70–99)
Potassium: 4.8 mEq/L (ref 3.7–5.3)
SODIUM: 142 meq/L (ref 137–147)

## 2014-05-09 LAB — GLUCOSE, CAPILLARY
GLUCOSE-CAPILLARY: 219 mg/dL — AB (ref 70–99)
Glucose-Capillary: 191 mg/dL — ABNORMAL HIGH (ref 70–99)
Glucose-Capillary: 207 mg/dL — ABNORMAL HIGH (ref 70–99)
Glucose-Capillary: 208 mg/dL — ABNORMAL HIGH (ref 70–99)
Glucose-Capillary: 227 mg/dL — ABNORMAL HIGH (ref 70–99)
Glucose-Capillary: 228 mg/dL — ABNORMAL HIGH (ref 70–99)

## 2014-05-09 LAB — CBC
HCT: 27.6 % — ABNORMAL LOW (ref 36.0–46.0)
HEMOGLOBIN: 7.7 g/dL — AB (ref 12.0–15.0)
MCH: 25.5 pg — AB (ref 26.0–34.0)
MCHC: 27.9 g/dL — ABNORMAL LOW (ref 30.0–36.0)
MCV: 91.4 fL (ref 78.0–100.0)
Platelets: 305 10*3/uL (ref 150–400)
RBC: 3.02 MIL/uL — AB (ref 3.87–5.11)
RDW: 20.7 % — ABNORMAL HIGH (ref 11.5–15.5)
WBC: 9.1 10*3/uL (ref 4.0–10.5)

## 2014-05-09 MED ORDER — INSULIN GLARGINE 100 UNIT/ML ~~LOC~~ SOLN
10.0000 [IU] | Freq: Every day | SUBCUTANEOUS | Status: DC
  Administered 2014-05-09: 10 [IU] via SUBCUTANEOUS
  Filled 2014-05-09 (×2): qty 0.1

## 2014-05-09 NOTE — Clinical Social Work Note (Signed)
CSW received report.  Pt first choice is [facility] (no dc's expected) and second choice is [facility].  CSW is now following.  Nonnie Done, [place] [phone]  Clinical Social Work

## 2014-05-09 NOTE — Progress Notes (Signed)
Inpatient Diabetes Program Recommendations  AACE/ADA: New Consensus Statement on Inpatient Glycemic Control (2013)  Target Ranges:  Prepandial:   less than 140 mg/dL      Peak postprandial:   less than 180 mg/dL (1-2 hours)      Critically ill patients:  140 - 180 mg/dL   Reason for Visit: Results for CARINE, NORDGREN (MRN [id number]) as of 05/09/2014 10:27  Ref. Range 05/08/2014 15:56 05/08/2014 19:28 05/09/2014 00:18 05/09/2014 03:51 05/09/2014 08:07  Glucose-Capillary Latest Range: 70-99 mg/dL 166 (H) 198 (H) 208 (H) 191 (H) 227 (H)   Note CBG's greater than goal.  May consider restarting a portion of patient's home dose of Lantus.  Consider Lantus 10 units daily.  Thanks, Adah Perl, RN, BC-ADM Inpatient Diabetes Coordinator Pager [phone]

## 2014-05-09 NOTE — Progress Notes (Signed)
PULMONARY / CRITICAL CARE MEDICINE  Name: Joanna Perez MRN: [id number] DOB: [birth year]    ADMISSION DATE:  04/07/2014 CONSULTATION DATE:  04/09/2014  REFERRING MD :  Dr. Annette Stable PRIMARY SERVICE:  PCCM  CHIEF COMPLAINT:  Paraplegia   BRIEF PATIENT DESCRIPTION: 58 yo with COPD, CHF, DM, CAD s/p MI, atrial fibrillation transferred from [facility] for BLE paraplegia, found to have T6 fracture with spinal stenosis and spinal cord compression on MRI. She was taken for decompressive surgery and remained on mechanical ventilation post surgery.   SIGNIFICANT EVENTS / STUDIES:  5/16  Admitted to [facility], hypotensive and with new paraplegia 5/17  Transfer to [facility] 5/17  MR Cervical/Thoracic/Lumbar Spine - worsened T6 compression fracture with paraspinal and epidural hematoma causing cord compression and edema, possible early cord infarct 5/18  Intubation for procedure 5/18  Thoracic laminectomy 5/18  Extubated 5/19  Somnolent / hypercarbic >>> reintubated 5/26  Tracheostomy 5/28  TTE >>> Ef 65%, vegetation is not reported 6/04  G-tube placement 6/10  Transfused PRBC 6/15  Transferred to ICU for vasopressors 6/17  BP improved with fluids / stress dose steroids, vasopressors off  LINES / TUBES: ETT 5/18 >>> 5/18; 5/19 >>>5/26 R R AL 5/17 >>> out Foley 5/16 >>> R PICC 5/22 >>> 5/28 Trach 5/26 (DF) >>> PEG 6/4 >>>  CULTURES: 5/16  Blood >>>  COAG NEG STAPH 5/16  Urine >>> neg 5/25 Sputum >  STENOTROPHOMONAS MALTOPHILIA 5/26  Sputum >>> STENOTROPHOMONAS MALTOPHILIA 5/26  Blood >>> COAG NEG STAPH 5/30  Blood  >>> neg  ANTIBIOTICS per ID: Vancomycin 5/17 >>>6/13 Levaquin 5/17 >>> 5/19 Imipenem 5/28 >>> 6/1  INTERVAL HISTORY: Off vasopressors.  Hyperglycemia while on stress dose steroids.  VITAL SIGNS: Temp:  [98.1 F (36.7 C)-99.2 F (37.3 C)] 98.9 F (37.2 C) (06/17 0832) Pulse Rate:  [45-93] 89 (06/17 1127) Resp:  [7-25] 20 (06/17 1127) BP: (91-132)/(33-77) 107/33  mmHg (06/17 1127) SpO2:  [92 %-100 %] 98 % (06/17 1127) FiO2 (%):  [40 %] 40 % (06/17 1127) Weight:  [99.8 kg (220 lb 0.3 oz)] 99.8 kg (220 lb 0.3 oz) (06/17 0438)  HEMODYNAMICS:    VENTILATOR SETTINGS: Vent Mode:  [-] PRVC FiO2 (%):  [40 %] 40 % Set Rate:  [20 bmp] 20 bmp Vt Set:  [400 mL] 400 mL PEEP:  [5 cmH20] 5 cmH20 Plateau Pressure:  [19 cmH20-28 cmH20] 28 cmH20  INTAKE / OUTPUT: Intake/Output     06/16 0701 - 06/17 0700 06/17 0701 - 06/18 0700   I.V. (mL/kg) 2025 (20.3) 400 (4)   Other 190    NG/GT 980 200   Total Intake(mL/kg) 3195 (32) 600 (6)   Urine (mL/kg/hr) 1200 (0.5) 350 (0.8)   Drains 350 (0.1)    Total Output 1550 350   Net +1645 +250          PHYSICAL EXAMINATION: General:  Ventilated, synchronous Neuro:  Sleepy, but arousable HEENT:  Tracheostomy site intact, minimal secretions Cardiovascular:  Regular, no murmurs Lungs:  Bilateral rhonchi Abdomen:  Obese, soft, bowel sounds present Musculoskeletal:  No edema Skin:  No rash  LABS: CBC  Recent Labs Lab 05/07/14 2304 05/08/14 0500 05/09/14 0500  WBC 11.8* 13.3* 9.1  HGB 8.2* 8.5* 7.7*  HCT 28.9* 29.9* 27.6*  PLT 291 317 305   BMET  Recent Labs Lab 05/07/14 2304 05/08/14 0500 05/09/14 0500  NA 143 145 142  K 4.8 4.8 4.8  CL 98 101 101  CO2 36*  35* 35*  BUN 35* 33* 33*  CREATININE 0.36* 0.32* 0.27*  GLUCOSE 135* 176* 210*   IMAGING: Dg Chest Port 1 View  05/08/2014   CLINICAL DATA:  Known airspace disease: Assess tracheostomy positioning  EXAM: PORTABLE CHEST - 1 VIEW  COMPARISON:  Portable chest x-ray of April 30, 2014  FINDINGS: The endotracheal tube tip lies at the level of the superior margin of the clavicular heads 3.7 cm above the crotch of the carina. The lungs are borderline hypoinflated. The interstitial markings remain increased but have improved since the previous study. The heart is top-normal in size. The central pulmonary vascularity remains prominent.  IMPRESSION:  There has been slight interval improvement in the appearance of the pulmonary interstitium. The tracheostomy appliance is in reasonable position.   Electronically Signed   By: David  Martinique   On: 05/08/2014 07:37   ASSESSMENT / PLAN:  PULMONARY A: Acute on chronic respiratory failure Respiratory muscle weakness secondary to cord compression / new T6 paraplegia  COPD Suspect OSA / OHS Tracheostomy status P:    Goal SpO2>92 Wean on CPAP / PS as tolerated Awaiting chronic vent facility placement DuoNeb / Albuterol / Pulmicort Prednisone 10 (chronic, preadmission ), restart once off stress steroids SLP  CARDIOVASCULAR A:  Chronic diastolic heart failure Shock / hypotension, likely sedation related in setting of adrenal insufficiency, lactate 0.7 reassuring P:  Goal SBP>100 Levophed gtt d/c'd  RENAL A:   Hypovolemia P:   Trend BMP Free water 200 ml q6h NS@100  D/c daily K as off Lasix  GASTROINTESTINAL A:   Nutrition GERD P:   TF Protonix   HEMATOLOGIC A:   Anemia VTE Px P:  Trend CBC SCD  INFECTIOUS A:   Bacteremia with COAG NEG STAPH STENOTROPHOMONAS MALTOPHILIA, 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  I have personally obtained history, examined patient, evaluated and interpreted laboratory and imaging results, reviewed medical records, formulated assessment / plan and placed orders.  CRITICAL CARE:  The patient is critically  ill with multiple organ systems failure and requires high complexity decision making for assessment and support, frequent evaluation and titration of therapies, application of advanced monitoring technologies and extensive interpretation of multiple databases. Critical Care Time devoted to patient care services described in this note is 35 minutes.   Doree Fudge, MD Pulmonary and [place] Pager: [phone]  05/09/2014, 11:28 AM

## 2014-05-10 LAB — GLUCOSE, CAPILLARY
GLUCOSE-CAPILLARY: 201 mg/dL — AB (ref 70–99)
GLUCOSE-CAPILLARY: 208 mg/dL — AB (ref 70–99)
Glucose-Capillary: 187 mg/dL — ABNORMAL HIGH (ref 70–99)
Glucose-Capillary: 199 mg/dL — ABNORMAL HIGH (ref 70–99)
Glucose-Capillary: 221 mg/dL — ABNORMAL HIGH (ref 70–99)
Glucose-Capillary: 197 mg/dL — ABNORMAL HIGH (ref 70–99)

## 2014-05-10 MED ORDER — INSULIN GLARGINE 100 UNIT/ML ~~LOC~~ SOLN
15.0000 [IU] | Freq: Every day | SUBCUTANEOUS | Status: DC
  Administered 2014-05-10: 15 [IU] via SUBCUTANEOUS
  Filled 2014-05-10 (×2): qty 0.15

## 2014-05-10 NOTE — Progress Notes (Signed)
PULMONARY / CRITICAL CARE MEDICINE  Name: Joanna Perez MRN: [id number] DOB: 04/21/1956    ADMISSION DATE:  04/07/2014 CONSULTATION DATE:  04/09/2014  REFERRING MD :  Dr. Annette Stable PRIMARY SERVICE:  PCCM  CHIEF COMPLAINT:  Paraplegia   BRIEF PATIENT DESCRIPTION: 58 yo with COPD, CHF, DM, CAD s/p MI, atrial fibrillation transferred from [facility] for BLE paraplegia, found to have T6 fracture with spinal stenosis and spinal cord compression on MRI. She was taken for decompressive surgery and remained on mechanical ventilation post surgery.   SIGNIFICANT EVENTS / STUDIES:  5/16  Admitted to [facility], hypotensive and with new paraplegia 5/17  Transfer to [facility] 5/17  MR Cervical/Thoracic/Lumbar Spine - worsened T6 compression fracture with paraspinal and epidural hematoma causing cord compression and edema, possible early cord infarct 5/18  Intubation for procedure 5/18  Thoracic laminectomy 5/18  Extubated 5/19  Somnolent / hypercarbic >>> reintubated 5/26  Tracheostomy 5/28  TTE >>> Ef 65%, vegetation is not reported 6/04  G-tube placement 6/10  Transfused PRBC 6/15  Transferred to ICU for vasopressors 6/17  BP improved with fluids / stress dose steroids, vasopressors off  LINES / TUBES: ETT 5/18 >>> 5/18; 5/19 >>>5/26 R R AL 5/17 >>> out Foley 5/16 >>> R PICC 5/22 >>> 5/28 Trach 5/26 (DF) >>> PEG 6/4 >>>  CULTURES: 5/16  Blood >>>  COAG NEG STAPH 5/16  Urine >>> neg 5/25 Sputum >  STENOTROPHOMONAS MALTOPHILIA 5/26  Sputum >>> STENOTROPHOMONAS MALTOPHILIA 5/26  Blood >>> COAG NEG STAPH 5/30  Blood  >>> neg  ANTIBIOTICS per ID: Vancomycin 5/17 >>>6/13 Levaquin 5/17 >>> 5/19 Imipenem 5/28 >>> 6/1  INTERVAL HISTORY: No overnight events.  VITAL SIGNS: Temp:  [98.3 F (36.8 C)-99.1 F (37.3 C)] 98.3 F (36.8 C) (06/18 0847) Pulse Rate:  [52-99] 94 (06/18 0757) Resp:  [15-28] 22 (06/18 0757) BP: (104-142)/(33-65) 126/54 mmHg (06/18 0757) SpO2:  [94 %-100 %] 98 %  (06/18 0758) FiO2 (%):  [40 %] 40 % (06/18 0759) Weight:  [103.2 kg (227 lb 8.2 oz)] 103.2 kg (227 lb 8.2 oz) (06/18 0403)  HEMODYNAMICS:    VENTILATOR SETTINGS: Vent Mode:  [-] PRVC FiO2 (%):  [40 %] 40 % Set Rate:  [20 bmp] 20 bmp Vt Set:  [400 mL] 400 mL PEEP:  [5 cmH20] 5 cmH20 Plateau Pressure:  [24 cmH20-30 cmH20] 27 cmH20  INTAKE / OUTPUT: Intake/Output     06/17 0701 - 06/18 0700 06/18 0701 - 06/19 0700   I.V. (mL/kg) 2300 (22.3)    Other 60    NG/GT 1250    Total Intake(mL/kg) 3610 (35)    Urine (mL/kg/hr) 1375 (0.6)    Drains     Stool 150 (0.1)    Total Output 1525     Net +2085            PHYSICAL EXAMINATION: General:  Ventilated, synchronous Neuro:  Awake, alert, attempting to talk HEENT:  Tracheostomy site intact, minimal secretions Cardiovascular:  Regular, no murmurs Lungs:  Bilateral rhonchi Abdomen:  Obese, soft, bowel sounds present Musculoskeletal:  No edema Skin:  No rash  LABS: CBC  Recent Labs Lab 05/07/14 2304 05/08/14 0500 05/09/14 0500  WBC 11.8* 13.3* 9.1  HGB 8.2* 8.5* 7.7*  HCT 28.9* 29.9* 27.6*  PLT 291 317 305   Coag's No results found for this basename: APTT, INR,  in the last 168 hours BMET  Recent Labs Lab 05/07/14 2304 05/08/14 0500 05/09/14 0500  NA 143  145 142  K 4.8 4.8 4.8  CL 98 101 101  CO2 36* 35* 35*  BUN 35* 33* 33*  CREATININE 0.36* 0.32* 0.27*  GLUCOSE 135* 176* 210*   Electrolytes  Recent Labs Lab 05/07/14 2304 05/08/14 0500 05/09/14 0500  CALCIUM 9.2 9.0 9.1   Sepsis Markers  Recent Labs Lab 05/07/14 2304  LATICACIDVEN 0.7   ABG No results found for this basename: PHART, PCO2ART, PO2ART,  in the last 168 hours  Liver Enzymes No results found for this basename: AST, ALT, ALKPHOS, BILITOT, ALBUMIN,  in the last 168 hours  Cardiac Enzymes No results found for this basename: TROPONINI, PROBNP,  in the last 168 hours  Glucose  Recent Labs Lab 05/09/14 1203 05/09/14 1529  05/09/14 1907 05/10/14 0025 05/10/14 0354 05/10/14 0810  GLUCAP 228* 207* 219* 201* 187* 199*   IMAGING: No results found.  ASSESSMENT / PLAN:  PULMONARY A: Acute on chronic respiratory failure Respiratory muscle weakness secondary to cord compression / new T6 paraplegia  COPD Suspect OSA / OHS Tracheostomy status P:    Goal SpO2>92 Wean on CPAP / PS as tolerated Awaiting chronic vent facility placement DuoNeb / Albuterol / Pulmicort Prednisone 10 (chronic, preadmission ), restart once off stress steroids SLP  CARDIOVASCULAR A:  Chronic diastolic heart failure Shock / hypotension, likely sedation related in setting of adrenal insufficiency, lactate 0.7 reassuring, resolved P:  Goal SBP>100  RENAL A:   Hypovolemia, improved P:   Trend BMP Free water 200 ml q6h Decrease NS@50   GASTROINTESTINAL A:   Nutrition GERD P:   TF Protonix   HEMATOLOGIC A:   Anemia, likely dilutional on chronic VTE Px P:  Trend CBC SCD  INFECTIOUS A:   Bacteremia with COAG NEG STAPH STENOTROPHOMONAS MALTOPHILIA, 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 Endocarditis unlikely, negative TTE Allergy PCN / cephalosporins Early stage II decubitus sacrum P:  Monitor off abx  ENDOCRINE  A:   Hyperglycemia Hypothyroidism Suspect adrenal insufficiency as on chronic low dose steroids P:   SSI Increase Lantus to 15 Synthroid TSH check 7/2 Hydrocortisone 50 mg q6h  NEUROLOGIC A:   T6 cord compression / paraplegia, s/p spinal cord decompression, still no LE movement Delirium, resolved Anxiety P:   Vicodin / Fentanyl / Ativan PRN Celexa, Xanax as preadmission PT  I have personally obtained history, examined patient, evaluated and interpreted laboratory and imaging results, reviewed medical records, formulated assessment / plan and placed orders.  Doree Fudge, MD Pulmonary and [place] Pager: [phone]  05/10/2014, 10:50 AM

## 2014-05-10 NOTE — Clinical Social Work Note (Addendum)
1:19pm- CSW spoke with Randall Hiss at [facility] who reports a delay in family providing necessary information.  This necessary information has been received and Randall Hiss is in the process of communicating with family.  Randall Hiss anticipates all loose ends to be tied up by tomorrow (05/10/2014).  Pt has step down order.  55M CSW has updated step down unit CSW.    CSW spoke with Tammy at [facility] who reports the pt's Medicaid application has not yet been completed.  Tammy reports she will contact our local representative, Randall Hiss, to inquire of details and have Randall Hiss contact this [place] awaiting a return call.  CSW was informed that pt is medically ready for dc pending vent SNF bed.  CSW called Corene Cornea at [facility] who reports no bed availability at this time.  Corene Cornea has no projection of possible availability, but reports willingness to keep this CSW informed.  Nonnie Done, [place] [phone]  Clinical Social Work

## 2014-05-10 NOTE — Progress Notes (Signed)
Earliest pt could discharge to vent SNF at this time is on Tuesday to [facility] in [place].   Ky Barban, MSW, [facility] Clinical Social Worker [phone]

## 2014-05-10 NOTE — Progress Notes (Signed)
PT Cancellation Note  Patient Details Name: Joanna Perez MRN: [id number] DOB: August 01, 1956   Cancelled Treatment:    Reason Treat Not Completed: Pain limiting ability to participate. Pt reports she has a terrible headache and her back "hurts so bad." (Pt lip-speaks). She said she could not do exercises due to pain. Informed her that PT could not come back today and that it would be tomorrow. She said that was fine, she just couldn't do it right now. RN notified and preparing to give pt pain medicine.    SASSER,LYNN 05/10/2014, 2:32 PM Pager [phone]

## 2014-05-10 NOTE — Progress Notes (Signed)
Inpatient Diabetes Program Recommendations  AACE/ADA: New Consensus Statement on Inpatient Glycemic Control (2013)  Target Ranges:  Prepandial:   less than 140 mg/dL      Peak postprandial:   less than 180 mg/dL (1-2 hours)      Critically ill patients:  140 - 180 mg/dL  Results for Joanna Perez, Joanna Perez (MRN [id number]) as of 05/10/2014 10:51  Ref. Range 05/09/2014 15:29 05/09/2014 19:07 05/10/2014 00:25 05/10/2014 03:54 05/10/2014 08:10  Glucose-Capillary Latest Range: 70-99 mg/dL 207 (H) 219 (H) 201 (H) 187 (H) 199 (H)   Inpatient Diabetes Program Recommendations Insulin - Meal Coverage: consider adding Novolog 3 units q 4 for tube feed coverage.  Tube feed coverage should be held if tube feeding is held or interrupted for any reason. Thank you  Raoul Pitch BSN, RN,CDE Inpatient Diabetes Coordinator [phone] (team pager)

## 2014-05-11 ENCOUNTER — Inpatient Hospital Stay (HOSPITAL_COMMUNITY): Payer: Medicaid Other

## 2014-05-11 LAB — GLUCOSE, CAPILLARY
GLUCOSE-CAPILLARY: 216 mg/dL — AB (ref 70–99)
GLUCOSE-CAPILLARY: 242 mg/dL — AB (ref 70–99)
GLUCOSE-CAPILLARY: 296 mg/dL — AB (ref 70–99)
Glucose-Capillary: 207 mg/dL — ABNORMAL HIGH (ref 70–99)
Glucose-Capillary: 218 mg/dL — ABNORMAL HIGH (ref 70–99)
Glucose-Capillary: 253 mg/dL — ABNORMAL HIGH (ref 70–99)

## 2014-05-11 LAB — BASIC METABOLIC PANEL
BUN: 31 mg/dL — ABNORMAL HIGH (ref 6–23)
CO2: 37 meq/L — AB (ref 19–32)
Calcium: 9.9 mg/dL (ref 8.4–10.5)
Chloride: 102 mEq/L (ref 96–112)
Creatinine, Ser: 0.27 mg/dL — ABNORMAL LOW (ref 0.50–1.10)
GFR calc Af Amer: >90 mL/min (ref >90)
GFR calc non Af Amer: >90 mL/min (ref >90)
GLUCOSE: 223 mg/dL — AB (ref 70–99)
POTASSIUM: 4.5 meq/L (ref 3.7–5.3)
SODIUM: 145 meq/L (ref 137–147)

## 2014-05-11 LAB — CBC
HCT: 32.2 % — ABNORMAL LOW (ref 36.0–46.0)
HEMOGLOBIN: 9.1 g/dL — AB (ref 12.0–15.0)
MCH: 26.4 pg (ref 26.0–34.0)
MCHC: 28.3 g/dL — ABNORMAL LOW (ref 30.0–36.0)
MCV: 93.3 fL (ref 78.0–100.0)
Platelets: 402 10*3/uL — ABNORMAL HIGH (ref 150–400)
RBC: 3.45 MIL/uL — AB (ref 3.87–5.11)
RDW: 20.7 % — ABNORMAL HIGH (ref 11.5–15.5)
WBC: 11.5 10*3/uL — ABNORMAL HIGH (ref 4.0–10.5)

## 2014-05-11 MED ORDER — DEXTROSE 5 % IV SOLN
1.0000 g | Freq: Three times a day (TID) | INTRAVENOUS | Status: DC
  Administered 2014-05-11 – 2014-05-18 (×22): 1 g via INTRAVENOUS
  Filled 2014-05-11 (×28): qty 1

## 2014-05-11 MED ORDER — METHYLPREDNISOLONE SODIUM SUCC 40 MG IJ SOLR
40.0000 mg | Freq: Three times a day (TID) | INTRAMUSCULAR | Status: DC
  Administered 2014-05-11 – 2014-05-13 (×6): 40 mg via INTRAVENOUS
  Filled 2014-05-11 (×8): qty 1

## 2014-05-11 MED ORDER — INSULIN GLARGINE 100 UNIT/ML ~~LOC~~ SOLN
35.0000 [IU] | Freq: Every day | SUBCUTANEOUS | Status: DC
  Administered 2014-05-11: 35 [IU] via SUBCUTANEOUS
  Filled 2014-05-11 (×2): qty 0.35

## 2014-05-11 MED ORDER — FUROSEMIDE 10 MG/ML IJ SOLN
40.0000 mg | Freq: Two times a day (BID) | INTRAMUSCULAR | Status: DC
  Administered 2014-05-11 – 2014-05-13 (×5): 40 mg via INTRAVENOUS
  Filled 2014-05-11 (×8): qty 4

## 2014-05-11 MED ORDER — LINEZOLID 2 MG/ML IV SOLN
600.0000 mg | Freq: Two times a day (BID) | INTRAVENOUS | Status: DC
  Administered 2014-05-11 – 2014-05-17 (×14): 600 mg via INTRAVENOUS
  Filled 2014-05-11 (×16): qty 300

## 2014-05-11 MED ORDER — METHYLPREDNISOLONE SODIUM SUCC 125 MG IJ SOLR
80.0000 mg | Freq: Three times a day (TID) | INTRAMUSCULAR | Status: DC
  Administered 2014-05-11: 80 mg via INTRAVENOUS
  Filled 2014-05-11 (×3): qty 1.28

## 2014-05-11 MED ORDER — INSULIN GLARGINE 100 UNIT/ML ~~LOC~~ SOLN
25.0000 [IU] | Freq: Every day | SUBCUTANEOUS | Status: DC
Start: 2014-05-11 — End: 2014-05-11
  Filled 2014-05-11: qty 0.25

## 2014-05-11 MED ORDER — INSULIN ASPART 100 UNIT/ML ~~LOC~~ SOLN
6.0000 [IU] | Freq: Three times a day (TID) | SUBCUTANEOUS | Status: DC
  Administered 2014-05-11 – 2014-05-15 (×11): 6 [IU] via SUBCUTANEOUS

## 2014-05-11 MED ORDER — IPRATROPIUM-ALBUTEROL 0.5-2.5 (3) MG/3ML IN SOLN
3.0000 mL | RESPIRATORY_TRACT | Status: DC
Start: 2014-05-11 — End: 2014-05-17
  Administered 2014-05-11 – 2014-05-17 (×37): 3 mL via RESPIRATORY_TRACT
  Filled 2014-05-11 (×36): qty 3

## 2014-05-11 NOTE — Progress Notes (Signed)
PULMONARY / CRITICAL CARE MEDICINE  Name: Joanna Perez MRN: [id number] DOB: 05/21/1956    ADMISSION DATE:  04/07/2014 CONSULTATION DATE:  04/09/2014  REFERRING MD :  Dr. Annette Stable PRIMARY SERVICE:  PCCM  CHIEF COMPLAINT:  Paraplegia   BRIEF PATIENT DESCRIPTION: 58 yo with COPD, CHF, DM, CAD s/p MI, atrial fibrillation transferred from [facility] for BLE paraplegia, found to have T6 fracture with spinal stenosis and spinal cord compression on MRI. She was taken for decompressive surgery and remained on mechanical ventilation post surgery.   SIGNIFICANT EVENTS / STUDIES:  5/16  Admitted to [facility], hypotensive and with new paraplegia 5/17  Transfer to [facility] 5/17  MR Cervical/Thoracic/Lumbar Spine - worsened T6 compression fracture with paraspinal and epidural hematoma causing cord compression and edema, possible early cord infarct 5/18  Intubation for procedure 5/18  Thoracic laminectomy 5/18  Extubated 5/19  Somnolent / hypercarbic >>> reintubated 5/26  Tracheostomy 5/28  TTE >>> Ef 65%, vegetation is not reported 6/04  G-tube placement 6/10  Transfused PRBC 6/15  Transferred to ICU for vasopressors 6/17  BP improved with fluids / stress dose steroids, vasopressors off 6/19  Respiratory distress / hypoxemia / increased airspace disease  LINES / TUBES: ETT 5/18 >>> 5/18; 5/19 >>>5/26 R R AL 5/17 >>> out Foley 5/16 >>> R PICC 5/22 >>> 5/28 Trach 5/26 (DF) >>> PEG 6/4 >>>  CULTURES: 5/16  Blood >>>  COAG NEG STAPH 5/16  Urine >>> neg 5/25 Sputum >  STENOTROPHOMONAS MALTOPHILIA 5/26  Sputum >>> STENOTROPHOMONAS MALTOPHILIA 5/26  Blood >>> COAG NEG STAPH 5/30  Blood  >>> neg  ANTIBIOTICS per ID: Vancomycin 5/17 >>>6/13 Levaquin 5/17 >>> 5/19 Imipenem 5/28 >>> 6/1 Zyvox 6/19 >>> Aztreonam 6/19 >>>  INTERVAL HISTORY: Increased oxygen requirements and bilateral airspace disease this am  VITAL SIGNS: Temp:  [97.8 F (36.6 C)-98.6 F (37 C)] 97.8 F (36.6 C)  (06/19 0425) Pulse Rate:  [62-112] 112 (06/19 0841) Resp:  [15-28] 20 (06/19 0841) BP: (97-175)/(51-97) 140/70 mmHg (06/19 0841) SpO2:  [90 %-100 %] 94 % (06/19 0842) FiO2 (%):  [40 %] 40 % (06/19 0842) Weight:  [103.7 kg (228 lb 9.9 oz)] 103.7 kg (228 lb 9.9 oz) (06/19 0500)  HEMODYNAMICS:    VENTILATOR SETTINGS: Vent Mode:  [-] PRVC FiO2 (%):  [40 %] 40 % Set Rate:  [20 bmp] 20 bmp Vt Set:  [400 mL] 400 mL PEEP:  [5 cmH20] 5 cmH20 Plateau Pressure:  [28 cmH20-34 cmH20] 31 cmH20  INTAKE / OUTPUT: Intake/Output     06/18 0701 - 06/19 0700 06/19 0701 - 06/20 0700   I.V. (mL/kg) 1250 (12.1) 50 (0.5)   Other     NG/GT 1000 50   Total Intake(mL/kg) 2250 (21.7) 100 (1)   Urine (mL/kg/hr) 1175 (0.5) 180 (0.9)   Stool 100 (0)    Total Output 1275 180   Net +975 -80          PHYSICAL EXAMINATION: General:  Work of breathing somewhat increased Neuro:  Obtunded  HEENT:  Tracheostomy site intact, minimal secretions Cardiovascular:  Tachycardic, irregular, frequent PVCs Lungs:  Bilateral very diminished air entry Abdomen:  Obese, soft, bowel sounds present Musculoskeletal:  No edema Skin:  No rash  LABS: CBC  Recent Labs Lab 05/08/14 0500 05/09/14 0500 05/11/14 0500  WBC 13.3* 9.1 11.5*  HGB 8.5* 7.7* 9.1*  HCT 29.9* 27.6* 32.2*  PLT 317 305 402*   Coag's No results found for this basename:  APTT, INR,  in the last 168 hours  BMET  Recent Labs Lab 05/08/14 0500 05/09/14 0500 05/11/14 0500  NA 145 142 145  K 4.8 4.8 4.5  CL 101 101 102  CO2 35* 35* 37*  BUN 33* 33* 31*  CREATININE 0.32* 0.27* 0.27*  GLUCOSE 176* 210* 223*   Electrolytes  Recent Labs Lab 05/08/14 0500 05/09/14 0500 05/11/14 0500  CALCIUM 9.0 9.1 9.9   Sepsis Markers  Recent Labs Lab 05/07/14 2304  LATICACIDVEN 0.7   ABG No results found for this basename: PHART, PCO2ART, PO2ART,  in the last 168 hours  Liver Enzymes No results found for this basename: AST, ALT, ALKPHOS,  BILITOT, ALBUMIN,  in the last 168 hours  Cardiac Enzymes No results found for this basename: TROPONINI, PROBNP,  in the last 168 hours  Glucose  Recent Labs Lab 05/10/14 1203 05/10/14 1546 05/10/14 1945 05/11/14 0004 05/11/14 0425 05/11/14 0736  GLUCAP 221* 208* 197* 207* 216* 218*   IMAGING: Dg Chest Port 1 View  05/11/2014   CLINICAL DATA:  Diminished breath sounds.  EXAM: PORTABLE CHEST - 1 VIEW  COMPARISON:  05/08/2014  FINDINGS: Heart is borderline in size. Support devices are stable. Patchy bibasilar atelectasis or infiltrate. No overt edema. Possible trace effusions.  IMPRESSION: Increasing bibasilar atelectasis or infiltrates. Possible small effusions.   Electronically Signed   By: Rolm Baptise M.D.   On: 05/11/2014 08:46    ASSESSMENT / PLAN:  PULMONARY A: Acute on chronic respiratory failure Respiratory muscle weakness secondary to cord compression / new T6 paraplegia  COPD exacerbation? Suspect OSA / OHS Tracheostomy status New HCAP? Pulmonary edema? P:    Goal SpO2>92 Full mechanical support for now DuoNeb / Albuterol / Pulmicort Start Solu-Medrol 80 mg q8h VAP Px  CARDIOVASCULAR A:  Chronic diastolic heart failure Shock / hypotension, likely sedation related in setting of adrenal insufficiency, lactate 0.7 reassuring, resolved P:  Goal SBP>100  RENAL A:   Hypovolemia, improved P:   Trend BMP D/c IVF D/c free water Lasix 40 now and q12h  GASTROINTESTINAL A:   Nutrition GERD P:   TF Protonix   HEMATOLOGIC A:   Anemia, likely dilutional on chronic VTE Px P:  Trend CBC SCD  INFECTIOUS A:   Bacteremia with COAG NEG STAPH STENOTROPHOMONAS MALTOPHILIA, 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 Endocarditis unlikely, negative TTE Allergy PCN / cephalosporins Early stage II decubitus sacrum New HCAP ( increased oxygen requirements, bilateral airspace disease, thrombocytopenia, leukocytosis ) 6/19 P:  Start Aztreonam / Zyvox  ENDOCRINE  A:    Hyperglycemia Hypothyroidism Suspect adrenal insufficiency as on chronic low dose steroids P:   SSI Increase Lantus to 25 Synthroid TSH check 7/2 D/c Hydrocortisone as on Solu-Medrol  NEUROLOGIC A:   T6 cord compression / paraplegia, s/p spinal cord decompression, still no LE movement Delirium, resolved Anxiety P:   Vicodin / Fentanyl / Ativan PRN D/c Celexa ( at risk for serotonin syndrome with Zyvox )  Xanax as preadmission PT  I have personally obtained history, examined patient, evaluated and interpreted laboratory and imaging results, reviewed medical records, formulated assessment / plan and placed orders.  CRITICAL CARE:  The patient is critically ill with multiple organ systems failure and requires high complexity decision making for assessment and support, frequent evaluation and titration of therapies, application of advanced monitoring technologies and extensive interpretation of multiple databases. Critical Care Time devoted to patient care services described in this note is 40 minutes.   Doree Fudge, MD Pulmonary and  [place] Pager: [phone]  05/11/2014, 9:14 AM

## 2014-05-11 NOTE — Progress Notes (Signed)
Physical Therapy Treatment Patient Details Name: Joanna Perez MRN: [id number] DOB: July 12, 1956 Today's Date: 05/11/2014    History of Present Illness 58 yo  transferred from [facility] (adm s/p fall) for BLE paraplegia, found to have T6 fracture with spinal stenosis and spinal cord compression on MRI. She was taken for decompressive surgery (T5-8) and remained on mechanical ventilation post surgery. Pt now s/p trach and has been unable to wean off vent. Peg 04/26/14 PMHx- with COPD, CHF, DM, CAD s/p MI, atrial fibrillation; Pt on home 4L 02 .    PT Comments    Pt admitted with above. Pt currently with functional limitations due to paraplegia, respiratory and endurance issues as well as lethargy.  Treatment limited due to HR and sats issues.  Pt will benefit from continued skilled PT to increase their independence and safety with mobility to allow discharge to the venue listed below.   Follow Up Recommendations  SNF;Supervision/Assistance - 24 hour     Equipment Recommendations  None recommended by PT    Recommendations for Other Services OT consult     Precautions / Restrictions Precautions Precautions: Fall Precaution Comments: bil pillow prafo boots, vent, rectal pouch, foley    Mobility  Bed Mobility Overal bed mobility: Needs Assistance;+2 for physical assistance             General bed mobility comments: total +3 to scoot to [facility] with bed in tilt position. Oxygen saturations incr with reposition Hr remained elevated 120 Pt with bed deflated to initiate transition to EOB with HR sustain 120 or greater. BP elevated as well. Pt appears distressed compared to previous session per OT that has been working with her in previous sessions. Elink was called by PT/OT and reports decr saturations trending on therapy arrival without mobility (sats had been 94% and > throughout day and now 86-90%). RN notified. Pt repositioned in bed with incr oxygen saturations to 96%.  .   Transfers                    Ambulation/Gait                 Stairs            Wheelchair Mobility    Modified Rankin (Stroke Patients Only)       Balance                                    Cognition Arousal/Alertness: Lethargic Behavior During Therapy: Flat affect Overall Cognitive Status: Difficult to assess                      Exercises General Exercises - Upper Extremity Shoulder Flexion: AROM;Left (reports pain elbow and shoulder new onset) Low Level/ICU Exercises Heel Slides: Left;5 reps;AAROM;Supine Pt having altered VS after movement of left LE.  Terminated exercise.    General Comments        Pertinent Vitals/Pain Vent at PEEP 5, 40% FiO2, 86% sats with initiation of transitional movement therefore stopped movement and repositioned pt and sats >94%.  HR up to 123 bpm without movement.  BP 150's/70's on arrival and up to 170's/50's after trial of movement.  Nursing aware of min treatment due to unstable VS.  Pt grimaces with movement of body/extremities.      Home Living  Prior Function            PT Goals (current goals can now be found in the care plan section) Acute Rehab PT Goals Patient Stated Goal: none stated at this time Progress towards PT goals: Not progressing toward goals - comment (Pt incr HR and decr sats w left LE mvmt/initiate transition)    Frequency  Min 2X/week    PT Plan Current plan remains appropriate    Co-evaluation PT/OT/SLP Co-Evaluation/Treatment: Yes Reason for Co-Treatment: Complexity of the patient's impairments (multi-system involvement) PT goals addressed during session: Strengthening/ROM       End of Session Equipment Utilized During Treatment: Oxygen (vent) Activity Tolerance: Patient limited by pain;Patient limited by fatigue Patient left: in bed;with call bell/phone within reach     Time: 1450-1515 PT Time Calculation (min): 25 min  Charges:   $Therapeutic Exercise: 8-22 mins                    G Codes:      Joanna Perez,Joanna Perez, 4:03 PM [facility] Acute Rehabilitation [phone] (pager)

## 2014-05-11 NOTE — Progress Notes (Signed)
Occupational Therapy Treatment Patient Details Name: Joanna Perez MRN: [id number] DOB: [birth year] Today's Date: 05/11/2014    History of present illness 58 yo  transferred from [facility] (adm s/p fall) for BLE paraplegia, found to have T6 fracture with spinal stenosis and spinal cord compression on MRI. She was taken for decompressive surgery (T5-8) and remained on mechanical ventilation post surgery. Pt now s/p trach and has been unable to wean off vent. Peg 04/26/14 PMHx- with COPD, CHF, DM, CAD s/p MI, atrial fibrillation; Pt on home 4L 02 .   OT comments  Pt not progressing toward goals this session much more lethargic and incr HR supine. Pt less responsive to therapist compared to previous sessions. Pt was repositioned supine due to vital changes.    Follow Up Recommendations  SNF;Supervision/Assistance - 24 hour    Equipment Recommendations  Wheelchair (measurements OT);Wheelchair cushion (measurements OT);Hospital bed    Recommendations for Other Services      Precautions / Restrictions Precautions Precautions: Fall Precaution Comments: bil pillow prafo boots, vent, rectal pouch, foley       Mobility Bed Mobility Overal bed mobility: Needs Assistance;+2 for physical assistance             General bed mobility comments: total +3 to scoot to [facility] with bed in tilt position. Oxygen saturations incr with reposition Hr remained elevated 120  Transfers                      Balance                                   ADL Overall ADL's : Needs assistance/impaired                                       General ADL Comments: Pt total (A) for all adls at this time. In previous sessions pt has used clip board to write messages to therapist. This session pt was unable to hold pen and demonstrates extreme dyspnea. Pt closing eyes and grabing at therapist. pt has been able to mouth words to therapist and pt only responding minimal this  session. Pt oriented to self  and location. Pt with bed deflated with HR sustain 120 or greater. Vent peep 9 and elevated BP. Pt appears distressed compared to previous session. Elink called and reports decr saturations trending on therapy arrival without mobility. RN notified. Pt repositioned in bed with incr oxygen saturations.       Vision                     Perception     Praxis      Cognition   Behavior During Therapy: Flat affect Overall Cognitive Status: Difficult to assess                       Extremity/Trunk Assessment               Exercises General Exercises - Upper Extremity Shoulder Flexion: AROM;Left (reports pain elbow and shoulder new onset)   Shoulder Instructions       General Comments      Pertinent Vitals/ Pain       HR 120  (vtach noted on monitor)       Max HR 123  Vent 5peep 40% and 400 volume  Home Living                                          Prior Functioning/Environment              Frequency Min 2X/week     Progress Toward Goals  OT Goals(current goals can now be found in the care plan section)  Progress towards OT goals: Not progressing toward goals - comment  Acute Rehab OT Goals Patient Stated Goal: none stated at this time OT Goal Formulation: With patient Time For Goal Achievement: 05/17/14 Potential to Achieve Goals: Fair ADL Goals Pt Will Perform Grooming: with min guard assist;bed level Pt Will Perform Upper Body Bathing: with min assist;bed level Pt/caregiver will Perform Home Exercise Program: Increased strength;Both right and left upper extremity;With theraband;With minimal assist Additional ADL Goal #1: Pt will sit EOB max (A) using BIL UE for support  Additional ADL Goal #2: Pt will tolerate EOB sitting for 5 minutes with VSS  Plan Discharge plan remains appropriate    Co-evaluation                 End of Session     Activity Tolerance     Patient  Left in bed;with call bell/phone within reach   Nurse Communication Mobility status;Precautions        Time: 4163-8453 OT Time Calculation (min): 23 min  Charges: OT General Charges $OT Visit: 1 Procedure OT Treatments $Therapeutic Activity: 8-22 mins  Parke Poisson B 05/11/2014, 3:35 PM Pager: [phone]

## 2014-05-11 NOTE — Progress Notes (Signed)
eLink Physician-Brief Progress Note Patient Name: Joanna Perez DOB: April 27, 1956 MRN: [id number]  Date of Service  05/11/2014   HPI/Events of Note   Cbgs> 250 On high dose solumedrol  eICU Interventions  Increase lantus to 35 u Add TF coverage Drop solumedrol 40 q 8h   Intervention Category Intermediate Interventions: Hyperglycemia - evaluation and treatment  ALVA,RAKESH V. 05/11/2014, 4:40 PM

## 2014-05-11 NOTE — Progress Notes (Signed)
ANTIBIOTIC CONSULT NOTE - INITIAL  Pharmacy Consult for aztreonam/ linezolid (per MD) Indication: pneumonia  Allergies  Allergen Reactions  . Amoxicillin Hives  . Bactrim [Sulfamethoxazole-Tmp Ds] Hives  . Erythromycin Hives  . Keflex [Cephalexin] Hives  . Penicillins Itching and Swelling    Sweating    Patient Measurements: Height: 5\' 2"  (157.5 cm) Weight: 228 lb 9.9 oz (103.7 kg) IBW/kg (Calculated) : 50.1 Adjusted Body Weight: n/a  Vital Signs: Temp: 98.4 F (36.9 C) (06/19 0841) Temp src: Oral (06/19 0425) BP: 140/70 mmHg (06/19 0841) Pulse Rate: 112 (06/19 0841) Intake/Output from previous day: 06/18 0701 - 06/19 0700 In: 2250 [I.V.:1250; NG/GT:1000] Out: 1275 [Urine:1175; Stool:100] Intake/Output from this shift: Total I/O In: 100 [I.V.:50; NG/GT:50] Out: 180 [Urine:180]  Labs:  Recent Labs  05/09/14 0500 05/11/14 0500  WBC 9.1 11.5*  HGB 7.7* 9.1*  PLT 305 402*  CREATININE 0.27* 0.27*   Estimated Creatinine Clearance: 87.6 ml/min (by C-G formula based on Cr of 0.27). No results found for this basename: VANCOTROUGH, Corlis Leak, VANCORANDOM, GENTTROUGH, GENTPEAK, GENTRANDOM, TOBRATROUGH, TOBRAPEAK, TOBRARND, AMIKACINPEAK, AMIKACINTROU, AMIKACIN,  in the last 72 hours   Microbiology: Recent Results (from the past 720 hour(s))  CULTURE, RESPIRATORY (NON-EXPECTORATED)     Status: None   Collection Time    04/16/14  6:06 PM      Result Value Ref Range Status   Specimen Description TRACHEAL ASPIRATE   Final   Special Requests NONE   Final   Gram Stain     Final   Value: FEW WBC PRESENT,BOTH PMN AND MONONUCLEAR     RARE SQUAMOUS EPITHELIAL CELLS PRESENT     RARE GRAM POSITIVE COCCI     IN PAIRS     Performed at [facility]   Culture     Final   Value: ABUNDANT STENOTROPHOMONAS MALTOPHILIA     Note: SUSCEPTIBILITIES PERFORMED ON PREVIOUS CULTURE WITHIN THE LAST 5 DAYS.     Performed at [facility]   Report Status 04/21/2014 FINAL    Final  CULTURE, BLOOD (ROUTINE X 2)     Status: None   Collection Time    04/17/14  9:35 AM      Result Value Ref Range Status   Specimen Description BLOOD LEFT ANTECUBITAL   Final   Special Requests BOTTLES DRAWN AEROBIC ONLY 1CC   Final   Culture  Setup Time     Final   Value: 04/17/2014 14:03     Performed at [facility]   Culture     Final   Value: STAPHYLOCOCCUS SPECIES (COAGULASE NEGATIVE)     Note: SUSCEPTIBILITIES PERFORMED ON PREVIOUS CULTURE WITHIN THE LAST 5 DAYS.     Note: Gram Stain Report Called to,Read Back By and Verified With: DARA ANDERSON ON 04/18/2014 AT 6:30P BY Dennard Nip     Performed at [facility]   Report Status 04/20/2014 FINAL   Final  CULTURE, BLOOD (ROUTINE X 2)     Status: None   Collection Time    04/17/14  9:40 AM      Result Value Ref Range Status   Specimen Description BLOOD LEFT HAND   Final   Special Requests BOTTLES DRAWN AEROBIC ONLY 2CC   Final   Culture  Setup Time     Final   Value: 04/17/2014 14:03     Performed at [facility]   Culture     Final   Value: STAPHYLOCOCCUS SPECIES (COAGULASE NEGATIVE)  Note: RIFAMPIN AND GENTAMICIN SHOULD NOT BE USED AS SINGLE DRUGS FOR TREATMENT OF STAPH INFECTIONS.     Note: Gram Stain Report Called to,Read Back By and Verified With: DARA ANDERSON ON 04/18/2014 AT 6:30P BY Dennard Nip     Performed at [facility]   Report Status 04/20/2014 FINAL   Final   Organism ID, Bacteria STAPHYLOCOCCUS SPECIES (COAGULASE NEGATIVE)   Final  CULTURE, RESPIRATORY (NON-EXPECTORATED)     Status: None   Collection Time    04/17/14 11:55 AM      Result Value Ref Range Status   Specimen Description TRACHEAL ASPIRATE   Final   Special Requests Normal   Final   Gram Stain     Final   Value: ABUNDANT WBC PRESENT, PREDOMINANTLY PMN     RARE SQUAMOUS EPITHELIAL CELLS PRESENT     NO ORGANISMS SEEN     Performed at [facility]   Culture     Final   Value: ABUNDANT STENOTROPHOMONAS  MALTOPHILIA     Performed at [facility]   Report Status 04/21/2014 FINAL   Final   Organism ID, Bacteria STENOTROPHOMONAS MALTOPHILIA   Final  CULTURE, BLOOD (ROUTINE X 2)     Status: None   Collection Time    04/21/14  3:20 AM      Result Value Ref Range Status   Specimen Description BLOOD LEFT ARM   Final   Special Requests BOTTLES DRAWN AEROBIC ONLY 10CC   Final   Culture  Setup Time     Final   Value: 04/21/2014 11:23     Performed at [facility]   Culture     Final   Value: NO GROWTH 5 DAYS     Performed at [facility]   Report Status 04/27/2014 FINAL   Final  CULTURE, BLOOD (ROUTINE X 2)     Status: None   Collection Time    04/21/14  3:25 AM      Result Value Ref Range Status   Specimen Description BLOOD LEFT ARM   Final   Special Requests BOTTLES DRAWN AEROBIC ONLY 10CC   Final   Culture  Setup Time     Final   Value: 04/21/2014 11:23     Performed at [facility]   Culture     Final   Value: NO GROWTH 5 DAYS     Performed at [facility]   Report Status 04/27/2014 FINAL   Final  MRSA PCR SCREENING     Status: Abnormal   Collection Time    05/07/14  4:05 PM      Result Value Ref Range Status   MRSA by PCR POSITIVE (*) NEGATIVE Final   Comment:            The GeneXpert MRSA Assay (FDA     approved for NASAL specimens     only), is one component of a     comprehensive MRSA colonization     surveillance program. It is not     intended to diagnose MRSA     infection nor to guide or     monitor treatment for     MRSA infections.     RESULT CALLED TO, READ BACK BY AND VERIFIED WITH:     S.SHAW,RN AT 1908 BY L.PITT 05/07/14  CLOSTRIDIUM DIFFICILE BY PCR     Status: None   Collection Time    05/07/14 10:02 PM      Result  Value Ref Range Status   C difficile by pcr NEGATIVE  NEGATIVE Final    Medical History: Past Medical History  Diagnosis Date  . Asthma   . COPD (chronic obstructive pulmonary disease)     Home O2  - 4L  . Hypothyroidism   . Essential hypertension, benign   . Hyperlipidemia   . Type 2 diabetes mellitus   . Atrial fibrillation   . MI (myocardial infarction)     2012  . Pericardial effusion   . Chronic diastolic heart failure     May 2015  . Thoracic compression fracture   . 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 Bacteremia and MDR Stenotroph - on vancomycin for 2 weeks, stopped 6/13. Pt is paraplegic so SCr likely falsely low.  Good UOP.  Goal of Therapy:  Resolution of infection  Plan:  1. Aztreonam 1g IV q8 hrs 2. Linezolid 600 mg IV q 12 hrs per MD dosing.  Will hold Celexa while on Linezolid (discussed with Dr. Earnest Conroy). 3. F/u renal function and clinical course.  Uvaldo Rising, BCPS  Clinical Pharmacist Pager [phone]  05/11/2014 9:56 AM

## 2014-05-11 NOTE — Progress Notes (Signed)
CSW spoke with pt's husband and updated him that clinically pt has been approved for vent SNF in [place], but that pt is not medically stable for transfer at this time. MDs continue to observe pt in ICU, and stepdown order has been revoked. CSW will follow for discharge planning to [facility] in [place], [place], when pt is medically stable. Pt has been added to waiting lists for [facility] in [place]--CSW to provide pt's husband with contact information for these facilities in the event she discharges to [place] and husband would like to move her closer to home.   Ky Barban, MSW, [facility] Clinical Social Worker [phone]

## 2014-05-12 ENCOUNTER — Inpatient Hospital Stay (HOSPITAL_COMMUNITY): Payer: Medicaid Other

## 2014-05-12 DIAGNOSIS — N170 Acute kidney failure with tubular necrosis: Secondary | ICD-10-CM

## 2014-05-12 LAB — CBC
HCT: 29.6 % — ABNORMAL LOW (ref 36.0–46.0)
Hemoglobin: 8.4 g/dL — ABNORMAL LOW (ref 12.0–15.0)
MCH: 25.8 pg — ABNORMAL LOW (ref 26.0–34.0)
MCHC: 28.4 g/dL — ABNORMAL LOW (ref 30.0–36.0)
MCV: 90.8 fL (ref 78.0–100.0)
PLATELETS: 416 10*3/uL — AB (ref 150–400)
RBC: 3.26 MIL/uL — AB (ref 3.87–5.11)
RDW: 21 % — AB (ref 11.5–15.5)
WBC: 12.7 10*3/uL — AB (ref 4.0–10.5)

## 2014-05-12 LAB — BASIC METABOLIC PANEL
BUN: 32 mg/dL — ABNORMAL HIGH (ref 6–23)
CO2: 39 meq/L — AB (ref 19–32)
CREATININE: 0.25 mg/dL — AB (ref 0.50–1.10)
Calcium: 10.2 mg/dL (ref 8.4–10.5)
Chloride: 95 mEq/L — ABNORMAL LOW (ref 96–112)
GFR calc non Af Amer: >90 mL/min (ref >90)
Glucose, Bld: 311 mg/dL — ABNORMAL HIGH (ref 70–99)
Potassium: 3.6 mEq/L — ABNORMAL LOW (ref 3.7–5.3)
SODIUM: 144 meq/L (ref 137–147)

## 2014-05-12 LAB — GLUCOSE, CAPILLARY
GLUCOSE-CAPILLARY: 240 mg/dL — AB (ref 70–99)
GLUCOSE-CAPILLARY: 277 mg/dL — AB (ref 70–99)
GLUCOSE-CAPILLARY: 327 mg/dL — AB (ref 70–99)
Glucose-Capillary: 281 mg/dL — ABNORMAL HIGH (ref 70–99)
Glucose-Capillary: 182 mg/dL — ABNORMAL HIGH (ref 70–99)
Glucose-Capillary: 174 mg/dL — ABNORMAL HIGH (ref 70–99)

## 2014-05-12 LAB — MAGNESIUM: MAGNESIUM: 1.6 mg/dL (ref 1.5–2.5)

## 2014-05-12 LAB — PHOSPHORUS: PHOSPHORUS: 2.5 mg/dL (ref 2.3–4.6)

## 2014-05-12 MED ORDER — ALPRAZOLAM 0.5 MG PO TABS
1.2500 mg | ORAL_TABLET | Freq: Three times a day (TID) | ORAL | Status: DC
  Administered 2014-05-12 – 2014-05-18 (×18): 1.25 mg via ORAL
  Filled 2014-05-12: qty 1
  Filled 2014-05-12 (×6): qty 2
  Filled 2014-05-12: qty 1
  Filled 2014-05-12: qty 2
  Filled 2014-05-12: qty 1
  Filled 2014-05-12: qty 2
  Filled 2014-05-12 (×5): qty 1
  Filled 2014-05-12 (×3): qty 2
  Filled 2014-05-12 (×3): qty 1
  Filled 2014-05-12 (×3): qty 2
  Filled 2014-05-12: qty 1
  Filled 2014-05-12: qty 2
  Filled 2014-05-12 (×2): qty 1
  Filled 2014-05-12 (×2): qty 2
  Filled 2014-05-12 (×2): qty 1
  Filled 2014-05-12: qty 2
  Filled 2014-05-12 (×2): qty 1

## 2014-05-12 MED ORDER — POTASSIUM CHLORIDE 10 MEQ/50ML IV SOLN
10.0000 meq | INTRAVENOUS | Status: DC
  Administered 2014-05-12 (×3): 10 meq via INTRAVENOUS
  Filled 2014-05-12 (×4): qty 50

## 2014-05-12 MED ORDER — WHITE PETROLATUM GEL
Status: AC
  Administered 2014-05-12: 0.2
  Filled 2014-05-12: qty 5

## 2014-05-12 MED ORDER — DEXTROSE 5 % IV SOLN
2.0000 ug/min | INTRAVENOUS | Status: DC
  Filled 2014-05-12: qty 4

## 2014-05-12 MED ORDER — MAGNESIUM SULFATE 40 MG/ML IJ SOLN
2.0000 g | Freq: Once | INTRAMUSCULAR | Status: AC
  Administered 2014-05-12: 2 g via INTRAVENOUS
  Filled 2014-05-12: qty 50

## 2014-05-12 MED ORDER — LORAZEPAM 2 MG/ML IJ SOLN
2.0000 mg | Freq: Four times a day (QID) | INTRAMUSCULAR | Status: DC | PRN
  Administered 2014-05-12 – 2014-05-18 (×9): 2 mg via INTRAVENOUS
  Filled 2014-05-12 (×11): qty 1

## 2014-05-12 MED ORDER — POTASSIUM CHLORIDE 10 MEQ/100ML IV SOLN: 10.0000 meq | INTRAVENOUS | Status: DC

## 2014-05-12 MED ORDER — INSULIN ASPART 100 UNIT/ML ~~LOC~~ SOLN
0.0000 [IU] | SUBCUTANEOUS | Status: DC
  Administered 2014-05-12: 11 [IU] via SUBCUTANEOUS
  Administered 2014-05-12 (×2): 4 [IU] via SUBCUTANEOUS
  Administered 2014-05-13: 11 [IU] via SUBCUTANEOUS
  Administered 2014-05-13: 7 [IU] via SUBCUTANEOUS
  Administered 2014-05-13: 4 [IU] via SUBCUTANEOUS
  Administered 2014-05-13 – 2014-05-14 (×2): 3 [IU] via SUBCUTANEOUS
  Administered 2014-05-15 (×3): 4 [IU] via SUBCUTANEOUS
  Administered 2014-05-15: 3 [IU] via SUBCUTANEOUS
  Administered 2014-05-15: 4 [IU] via SUBCUTANEOUS
  Administered 2014-05-16 (×2): 3 [IU] via SUBCUTANEOUS

## 2014-05-12 MED ORDER — INSULIN GLARGINE 100 UNIT/ML ~~LOC~~ SOLN
40.0000 [IU] | Freq: Every day | SUBCUTANEOUS | Status: DC
  Administered 2014-05-12 – 2014-05-14 (×3): 40 [IU] via SUBCUTANEOUS
  Filled 2014-05-12 (×4): qty 0.4

## 2014-05-12 MED ORDER — POTASSIUM CHLORIDE 10 MEQ/50ML IV SOLN: 10.0000 meq | INTRAVENOUS | Status: AC

## 2014-05-12 NOTE — Progress Notes (Signed)
Foley leaking post lasix dose. Checked balloon. Added water to make 10 cc. Leaked again. Removed. Checked with NP re need to replace. Will attempt to replace. If unable, okay to leave out.

## 2014-05-12 NOTE — Progress Notes (Signed)
PULMONARY / CRITICAL CARE MEDICINE  Name: Joanna Perez MRN: [id number] DOB: 1956/10/20    ADMISSION DATE:  04/07/2014 CONSULTATION DATE:  04/09/2014  REFERRING MD :  Dr. Annette Stable PRIMARY SERVICE:  PCCM  CHIEF COMPLAINT:  Paraplegia   BRIEF PATIENT DESCRIPTION: 58 yo with COPD, CHF, DM, CAD s/p MI, atrial fibrillation transferred from [facility] for BLE paraplegia, found to have T6 fracture with spinal stenosis and spinal cord compression on MRI. She was taken for decompressive surgery and remained on mechanical ventilation post surgery. Tracheostomy placed 5/26.  Improved and tx to SDU for vent weaning.  On 6/15 developed hypotension requiring pressors and tx back to ICU.   SIGNIFICANT EVENTS / STUDIES:  5/16  Admitted to [facility], hypotensive and with new paraplegia 5/17  Transfer to [facility] 5/17  MR Cervical/Thoracic/Lumbar Spine - worsened T6 compression fracture with paraspinal and epidural hematoma causing cord compression and edema, possible early cord infarct 5/18  Intubation for procedure 5/18  Thoracic laminectomy 5/18  Extubated 5/19  Somnolent / hypercarbic >>> reintubated 5/26  Tracheostomy 5/28  TTE >>> Ef 65%, vegetation is not reported 6/04  G-tube placement 6/10  Transfused PRBC 6/15  Transferred to ICU for vasopressors 6/17  BP improved with fluids / stress dose steroids, vasopressors off 6/19  Respiratory distress / hypoxemia / increased airspace disease  LINES / TUBES: ETT 5/18 >>> 5/18; 5/19 >>>5/26 R R AL 5/17 >>> out Foley 5/16 >>> R PICC 5/22 >>> 5/28 Trach 5/26 (DF) >>> PEG 6/4 >>>  CULTURES: 5/16  Blood >>>  COAG NEG STAPH 5/16  Urine >>> neg 5/25 Sputum >  STENOTROPHOMONAS MALTOPHILIA 5/26  Sputum >>> STENOTROPHOMONAS MALTOPHILIA 5/26  Blood >>> COAG NEG STAPH 5/30  Blood  >>> neg  ANTIBIOTICS per ID: Vancomycin 5/17 >>>6/13 Levaquin 5/17 >>> 5/19 Imipenem 5/28 >>> 6/1 Zyvox 6/19 >>> Aztreonam 6/19 >>>  INTERVAL HISTORY:  No acute  change.  C/o back pain.    VITAL SIGNS: Temp:  [98.1 F (36.7 C)-98.6 F (37 C)] 98.6 F (37 C) (06/20 0735) Pulse Rate:  [87-120] 100 (06/20 1000) Resp:  [18-27] 20 (06/20 1000) BP: (118-171)/(64-100) 148/71 mmHg (06/20 0900) SpO2:  [86 %-100 %] 99 % (06/20 1000) FiO2 (%):  [40 %] 40 % (06/20 0816) Weight:  [230 lb 13.2 oz (104.7 kg)] 230 lb 13.2 oz (104.7 kg) (06/20 0500)  HEMODYNAMICS:    VENTILATOR SETTINGS: Vent Mode:  [-] PRVC FiO2 (%):  [40 %] 40 % Set Rate:  [20 bmp] 20 bmp Vt Set:  [400 mL] 400 mL PEEP:  [5 cmH20] 5 cmH20 Plateau Pressure:  [27 VEL38-10 cmH20] 28 cmH20  INTAKE / OUTPUT: Intake/Output     06/19 0701 - 06/20 0700 06/20 0701 - 06/21 0700   I.V. (mL/kg) 100 (1)    Other  90   NG/GT 1150 200   IV Piggyback 800 500   Total Intake(mL/kg) 2050 (19.6) 790 (7.5)   Urine (mL/kg/hr) 3760 (1.5) 50 (0.1)   Stool     Total Output 3760 50   Net -1710 +740          PHYSICAL EXAMINATION: General:  Chronically ill appearing female, NAD  Neuro:  Awake, alert, appropriate, anxious  HEENT:  Tracheostomy site intact, minimal secretions Cardiovascular:  Tachycardic, irregular, frequent PVCs Lungs:  resps even non labored on vent, Bilateral very diminished air entry Abdomen:  Obese, soft, bowel sounds present Musculoskeletal:  No edema Skin:  No rash  LABS: CBC  Recent Labs Lab 05/09/14 0500 05/11/14 0500 05/12/14 0500  WBC 9.1 11.5* 12.7*  HGB 7.7* 9.1* 8.4*  HCT 27.6* 32.2* 29.6*  PLT 305 402* 416*   Coag's No results found for this basename: APTT, INR,  in the last 168 hours  BMET  Recent Labs Lab 05/09/14 0500 05/11/14 0500 05/12/14 0500  NA 142 145 144  K 4.8 4.5 3.6*  CL 101 102 95*  CO2 35* 37* 39*  BUN 33* 31* 32*  CREATININE 0.27* 0.27* 0.25*  GLUCOSE 210* 223* 311*   Electrolytes  Recent Labs Lab 05/09/14 0500 05/11/14 0500 05/12/14 0500  CALCIUM 9.1 9.9 10.2  MG  --   --  1.6  PHOS  --   --  2.5   Sepsis  Markers  Recent Labs Lab 05/07/14 2304  LATICACIDVEN 0.7   ABG No results found for this basename: PHART, PCO2ART, PO2ART,  in the last 168 hours  Liver Enzymes No results found for this basename: AST, ALT, ALKPHOS, BILITOT, ALBUMIN,  in the last 168 hours  Cardiac Enzymes No results found for this basename: TROPONINI, PROBNP,  in the last 168 hours  Glucose  Recent Labs Lab 05/11/14 1134 05/11/14 1528 05/11/14 1942 05/12/14 0010 05/12/14 0356 05/12/14 0712  GLUCAP 242* 296* 253* 240* 277* 327*   IMAGING: Dg Chest Port 1 View  05/12/2014   CLINICAL DATA:  Infiltrates  EXAM: PORTABLE CHEST - 1 VIEW  COMPARISON:  Chest x-ray from yesterday  FINDINGS: Tracheostomy tube remains well seated. A left IJ central catheter tip remains at the upper SVC.  Stable cardiomegaly.  Streaky bibasilar opacities appears similar to prior, most focal in the retrocardiac lung. No increasing pleural fluid. No new pulmonary edema. No pneumothorax.  IMPRESSION: Unchanged hypoaeration with bibasilar atelectasis or pneumonia.   Electronically Signed   By: Jorje Guild M.D.   On: 05/12/2014 07:22   Dg Chest Port 1 View  05/11/2014   CLINICAL DATA:  Diminished breath sounds.  EXAM: PORTABLE CHEST - 1 VIEW  COMPARISON:  05/08/2014  FINDINGS: Heart is borderline in size. Support devices are stable. Patchy bibasilar atelectasis or infiltrate. No overt edema. Possible trace effusions.  IMPRESSION: Increasing bibasilar atelectasis or infiltrates. Possible small effusions.   Electronically Signed   By: Rolm Baptise M.D.   On: 05/11/2014 08:46    ASSESSMENT / PLAN:  PULMONARY A: Acute on chronic respiratory failure Respiratory muscle weakness secondary to cord compression / new T6 paraplegia  AECOPD Suspect OSA / OHS Tracheostomy status ?HCAP P:    Goal SpO2>92 Vent wean as tol  Cont DuoNeb / Albuterol / Pulmicort Cont solumedrol and taper (chronic pred) VAP Px Gentle diuresis as below    CARDIOVASCULAR A:  Chronic diastolic heart failure Shock / hypotension, likely sedation related in setting of adrenal insufficiency, lactate 0.7 reassuring, resolved P:  Goal SBP>100 Diuresis - lasix q12  RENAL A:   Hypovolemia, improved Hypokalemia  P:   Trend BMP Lasix q12 Replete K PRN  GASTROINTESTINAL A:   Nutrition GERD Diarrhea - CDiff neg  S/p PEG 6/4 P:   TF Protonix  D/c bowel regimen for now   HEMATOLOGIC A:   Anemia, likely dilutional on chronic VTE Px P:  Trend CBC SCD  INFECTIOUS A:   Bacteremia with COAG NEG STAPH STENOTROPHOMONAS MALTOPHILIA, 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 Early stage II decubitus sacrum New HCAP 6/19 P:  Cont Aztreonam / Zyvox as above  F/u culture data   ENDOCRINE  A:  Hyperglycemia Hypothyroidism Suspect adrenal insufficiency-- on chronic low dose steroids P:   SSI Increase Lantus to 40u 6/20 Taper steroids as able  Synthroid TSH check 7/2  NEUROLOGIC A:   T6 cord compression / paraplegia, s/p spinal cord decompression, still no LE movement Delirium, resolved Anxiety P:   Vicodin / Fentanyl / Ativan PRN Cont hold celexa ( at risk for serotonin syndrome with Zyvox )  Xanax as preadmission - increase 6/20 with frequent need PRN ativan  PT/OT  Is approved for vent SNF at [facility] in [place] once medically ready for tx.  Anticipate early next week. Will tx to SDU and cont IV abx, steroids and diuresis as above.   Nickolas Madrid, NP 05/12/2014  11:03 AM Pager: [phone] or [phone]  Awaiting transfer but now with new onset HCAP will likely delay transfer.  Will continue abx and f/u on cultures.  CRITICAL CARE:  The patient is critically ill with multiple organ systems failure and requires high complexity decision making for assessment and support, frequent evaluation and titration of therapies, application of advanced monitoring technologies and extensive interpretation of multiple databases. Critical Care  Time devoted to patient care services described in this note is 35 minutes.   Rush Farmer, M.D. [facility] Pulmonary/Critical Care Medicine. Pager: [phone]. After hours pager: [phone].

## 2014-05-12 NOTE — Progress Notes (Signed)
[facility] ADULT ICU REPLACEMENT PROTOCOL FOR AM LAB REPLACEMENT ONLY  The patient does not apply for the [facility] Adult ICU Electrolyte Replacment Protocol based on the criteria listed below:   1. Is GFR >/= 40 ml/min? yes  Patient's GFR today is >90 2. Is urine output >/= 0.5 ml/kg/hr for the last 6 hours? no Patient's UOP is none recorded since 1800 06/19 3. Is BUN < 60 mg/dL? yes  Patient's BUN today is 32. Abnormal electrolyte(s): k+3.6  Mg 1.6 5. Ordered repletion with: NA 6. If a panic level lab has been reported, has the CCM MD in charge been notified? yes.   Physician:  Bethann Humble [facility] 05/12/2014 6:35 AM

## 2014-05-13 DIAGNOSIS — J438 Other emphysema: Secondary | ICD-10-CM

## 2014-05-13 DIAGNOSIS — R0902 Hypoxemia: Secondary | ICD-10-CM

## 2014-05-13 LAB — GLUCOSE, CAPILLARY
GLUCOSE-CAPILLARY: 159 mg/dL — AB (ref 70–99)
GLUCOSE-CAPILLARY: 266 mg/dL — AB (ref 70–99)
Glucose-Capillary: 203 mg/dL — ABNORMAL HIGH (ref 70–99)
Glucose-Capillary: 163 mg/dL — ABNORMAL HIGH (ref 70–99)
Glucose-Capillary: 217 mg/dL — ABNORMAL HIGH (ref 70–99)
Glucose-Capillary: 148 mg/dL — ABNORMAL HIGH (ref 70–99)

## 2014-05-13 LAB — BASIC METABOLIC PANEL
BUN: 31 mg/dL — AB (ref 6–23)
CO2: 45 mEq/L (ref 19–32)
Calcium: 9.8 mg/dL (ref 8.4–10.5)
Chloride: 97 mEq/L (ref 96–112)
Creatinine, Ser: 0.22 mg/dL — ABNORMAL LOW (ref 0.50–1.10)
GFR calc Af Amer: >90 mL/min (ref >90)
GFR calc non Af Amer: >90 mL/min (ref >90)
Glucose, Bld: 171 mg/dL — ABNORMAL HIGH (ref 70–99)
Potassium: 4.1 mEq/L (ref 3.7–5.3)
Sodium: 147 mEq/L (ref 137–147)

## 2014-05-13 LAB — CBC
HCT: 29.6 % — ABNORMAL LOW (ref 36.0–46.0)
Hemoglobin: 8.2 g/dL — ABNORMAL LOW (ref 12.0–15.0)
MCH: 25.2 pg — ABNORMAL LOW (ref 26.0–34.0)
MCHC: 27.7 g/dL — ABNORMAL LOW (ref 30.0–36.0)
MCV: 91.1 fL (ref 78.0–100.0)
Platelets: 432 K/uL — ABNORMAL HIGH (ref 150–400)
RBC: 3.25 MIL/uL — ABNORMAL LOW (ref 3.87–5.11)
RDW: 20.9 % — ABNORMAL HIGH (ref 11.5–15.5)
WBC: 11.6 K/uL — ABNORMAL HIGH (ref 4.0–10.5)

## 2014-05-13 MED ORDER — METHYLPREDNISOLONE SODIUM SUCC 40 MG IJ SOLR
40.0000 mg | INTRAMUSCULAR | Status: DC
Start: 2014-05-14 — End: 2014-05-14
  Administered 2014-05-14: 40 mg via INTRAVENOUS
  Filled 2014-05-13: qty 1

## 2014-05-13 MED ORDER — FUROSEMIDE 10 MG/ML IJ SOLN
40.0000 mg | Freq: Every day | INTRAMUSCULAR | Status: DC
  Administered 2014-05-14 – 2014-05-17 (×4): 40 mg via INTRAVENOUS
  Filled 2014-05-13 (×5): qty 4

## 2014-05-13 NOTE — Progress Notes (Signed)
PULMONARY / CRITICAL CARE MEDICINE  Name: Joanna Perez MRN: [id number] DOB: 1956/09/15    ADMISSION DATE:  04/07/2014 CONSULTATION DATE:  04/09/2014  REFERRING MD :  Dr. Annette Stable PRIMARY SERVICE:  PCCM  CHIEF COMPLAINT:  Paraplegia   BRIEF PATIENT DESCRIPTION: 58 yo with COPD, CHF, DM, CAD s/p MI, atrial fibrillation transferred from [facility] for BLE paraplegia, found to have T6 fracture with spinal stenosis and spinal cord compression on MRI. She was taken for decompressive surgery and remained on mechanical ventilation post surgery. Tracheostomy placed 5/26.  Improved and tx to SDU for vent weaning.  On 6/15 developed hypotension requiring pressors and tx back to ICU.   SIGNIFICANT EVENTS / STUDIES:  5/16  Admitted to [facility], hypotensive and with new paraplegia 5/17  Transfer to [facility] 5/17  MR Cervical/Thoracic/Lumbar Spine - worsened T6 compression fracture with paraspinal and epidural hematoma causing cord compression and edema, possible early cord infarct 5/18  Intubation for procedure 5/18  Thoracic laminectomy 5/18  Extubated 5/19  Somnolent / hypercarbic >>> reintubated 5/26  Tracheostomy 5/28  TTE >>> Ef 65%, vegetation is not reported 6/04  G-tube placement 6/10  Transfused PRBC 6/15  Transferred to ICU for vasopressors 6/17  BP improved with fluids / stress dose steroids, vasopressors off 6/19  Respiratory distress / hypoxemia / increased airspace disease  LINES / TUBES: ETT 5/18 >>> 5/18; 5/19 >>>5/26 R R AL 5/17 >>> out Foley 5/16 >>> R PICC 5/22 >>> 5/28 Trach 5/26 (DF) >>> PEG 6/4 >>>  CULTURES: 5/16  Blood >>>  COAG NEG STAPH 5/16  Urine >>> neg 5/25 Sputum >  STENOTROPHOMONAS MALTOPHILIA 5/26  Sputum >>> STENOTROPHOMONAS MALTOPHILIA 5/26  Blood >>> COAG NEG STAPH 5/30  Blood  >>> neg  ANTIBIOTICS per ID: Vancomycin 5/17 >>>6/13 Levaquin 5/17 >>> 5/19 Imipenem 5/28 >>> 6/1 Zyvox 6/19 >>> Aztreonam 6/19 >>>  INTERVAL HISTORY:   Pt  comfortable on vent, no increased wob.  Diuresing well overnight  VITAL SIGNS: Temp:  [97.7 F (36.5 C)-98.3 F (36.8 C)] 97.8 F (36.6 C) (06/21 1119) Pulse Rate:  [70-122] 70 (06/21 1119) Resp:  [20-25] 20 (06/21 1119) BP: (105-174)/(45-85) 114/45 mmHg (06/21 1119) SpO2:  [94 %-100 %] 100 % (06/21 1119) FiO2 (%):  [40 %] 40 % (06/21 1144)  HEMODYNAMICS:    VENTILATOR SETTINGS: Vent Mode:  [-] PRVC FiO2 (%):  [40 %] 40 % Set Rate:  [20 bmp] 20 bmp Vt Set:  [400 mL] 400 mL PEEP:  [5 cmH20] 5 cmH20 Plateau Pressure:  [28 cmH20-33 cmH20] 33 cmH20  INTAKE / OUTPUT: Intake/Output     06/20 0701 - 06/21 0700 06/21 0701 - 06/22 0700   I.V. (mL/kg)     Other 150    NG/GT 1127.5    IV Piggyback 600    Total Intake(mL/kg) 1877.5 (17.9)    Urine (mL/kg/hr) 5005 (2) 1600 (2.9)   Stool 100 (0)    Total Output 5105 1600   Net -3227.5 -1600          PHYSICAL EXAMINATION: General:  Chronically ill appearing female, NAD  Neuro:  Awake, easily arousable, poor movement of extremities HEENT:  Tracheostomy site intact, minimal secretions, nose without purulence Cardiovascular:  Rrr, sounds regular, no murmurs Lungs: basilar crackles, no active wheezing. Abdomen:  Obese, soft, bowel sounds present, nontender. Musculoskeletal:  No edema, PAS in place.   LABS: CBC  Recent Labs Lab 05/11/14 0500 05/12/14 0500 05/13/14 0730  WBC 11.5* 12.7*  11.6*  HGB 9.1* 8.4* 8.2*  HCT 32.2* 29.6* 29.6*  PLT 402* 416* 432*   BMET  Recent Labs Lab 05/11/14 0500 05/12/14 0500 05/13/14 0730  NA 145 144 147  K 4.5 3.6* 4.1  CL 102 95* 97  CO2 37* 39* 45*  BUN 31* 32* 31*  CREATININE 0.27* 0.25* 0.22*  GLUCOSE 223* 311* 171*     ASSESSMENT / PLAN:  PULMONARY A: Acute on chronic respiratory failure Respiratory muscle weakness secondary to cord compression / new T6 paraplegia  AECOPD Suspect OSA / OHS Tracheostomy status ?HCAP P:     Vent wean as tol  Cont BD Cont  steroid taper (chronic pred)  CARDIOVASCULAR A:  Chronic diastolic heart failure P:  Suspect getting to dry weight with rising sodium and bicarb.  Will decrease diuretics   GASTROINTESTINAL A:   Nutrition GERD Diarrhea - CDiff neg  S/p PEG 6/4 P:   TF   INFECTIOUS A:   Bacteremia with COAG NEG STAPH STENOTROPHOMONAS MALTOPHILIA, 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 Early stage II decubitus sacrum New HCAP 6/19 P:  Cont Aztreonam / Zyvox as above  No culture data available.   ENDOCRINE  A:   Hyperglycemia Hypothyroidism Suspect adrenal insufficiency-- on chronic low dose steroids P:   SSI Increase Lantus to 40u 6/20 Taper steroids as able  Synthroid TSH check 7/2  NEUROLOGIC A:   T6 cord compression / paraplegia, s/p spinal cord decompression, still no LE movement Delirium, resolved Anxiety P:   Vicodin / Fentanyl / Ativan PRN Cont hold celexa ( at risk for serotonin syndrome with Zyvox )  Xanax as preadmission - increase 6/20 with frequent need PRN ativan  PT/OT  Is approved for vent SNF at [facility] in [place] once medically ready for tx.  Anticipate early next week.

## 2014-05-14 LAB — BASIC METABOLIC PANEL
BUN: 29 mg/dL — ABNORMAL HIGH (ref 6–23)
CALCIUM: 9.8 mg/dL (ref 8.4–10.5)
CO2: >45 mEq/L (ref 19–32)
Chloride: 93 mEq/L — ABNORMAL LOW (ref 96–112)
Creatinine, Ser: 0.2 mg/dL — ABNORMAL LOW (ref 0.50–1.10)
GLUCOSE: 75 mg/dL (ref 70–99)
Potassium: 3.9 mEq/L (ref 3.7–5.3)
SODIUM: 146 meq/L (ref 137–147)

## 2014-05-14 LAB — GLUCOSE, CAPILLARY
GLUCOSE-CAPILLARY: 111 mg/dL — AB (ref 70–99)
GLUCOSE-CAPILLARY: 120 mg/dL — AB (ref 70–99)
GLUCOSE-CAPILLARY: 141 mg/dL — AB (ref 70–99)
GLUCOSE-CAPILLARY: 73 mg/dL (ref 70–99)
GLUCOSE-CAPILLARY: 73 mg/dL (ref 70–99)
Glucose-Capillary: 73 mg/dL (ref 70–99)

## 2014-05-14 MED ORDER — PREDNISONE 5 MG/5ML PO SOLN
20.0000 mg | Freq: Every day | ORAL | Status: DC
Start: 2014-05-15 — End: 2014-05-18
  Administered 2014-05-15 – 2014-05-18 (×5): 20 mg
  Filled 2014-05-14 (×6): qty 20

## 2014-05-14 NOTE — Progress Notes (Signed)
PULMONARY / CRITICAL CARE MEDICINE  Name: Joanna Perez MRN: [id number] DOB: 02/25/1956    ADMISSION DATE:  04/07/2014 CONSULTATION DATE:  04/09/2014  REFERRING MD :  Dr. Annette Stable PRIMARY SERVICE:  PCCM  CHIEF COMPLAINT:  Paraplegia   BRIEF PATIENT DESCRIPTION: 58 yo with COPD, CHF, DM, CAD s/p MI, atrial fibrillation transferred from [facility] for BLE paraplegia, found to have T6 fracture with spinal stenosis and spinal cord compression on MRI. She was taken for decompressive surgery and remained on mechanical ventilation post surgery. Tracheostomy placed 5/26.  Improved and tx to SDU for vent weaning.  On 6/15 developed hypotension requiring pressors and tx back to ICU.   SIGNIFICANT EVENTS / STUDIES:  5/16  Admitted to [facility], hypotensive and with new paraplegia 5/17  Transfer to [facility] 5/17  MR Cervical/Thoracic/Lumbar Spine - worsened T6 compression fracture with paraspinal and epidural hematoma causing cord compression and edema, possible early cord infarct 5/18  Intubation for procedure 5/18  Thoracic laminectomy 5/18  Extubated 5/19  Somnolent / hypercarbic >>> reintubated 5/26  Tracheostomy 5/28  TTE >>> Ef 65%, vegetation is not reported 6/04  G-tube placement 6/10  Transfused PRBC 6/15  Transferred to ICU for vasopressors 6/17  BP improved with fluids / stress dose steroids, vasopressors off 6/19  Respiratory distress / hypoxemia / increased airspace disease  LINES / TUBES: ETT 5/18 >>> 5/18; 5/19 >>>5/26 R R AL 5/17 >>> out Foley 5/16 >>> R PICC 5/22 >>> 5/28 Trach 5/26 (DF) >>> PEG 6/4 >>>  CULTURES: 5/16  Blood >>>  COAG NEG STAPH 5/16  Urine >>> neg 5/25 Sputum >  STENOTROPHOMONAS MALTOPHILIA 5/26  Sputum >>> STENOTROPHOMONAS MALTOPHILIA 5/26  Blood >>> COAG NEG STAPH 5/30  Blood  >>> neg  ANTIBIOTICS per ID: Vancomycin 5/17 >>>6/13 Levaquin 5/17 >>> 5/19 Imipenem 5/28 >>> 6/1 Zyvox 6/19 >>> Aztreonam 6/19 >>>  INTERVAL HISTORY:   Pt  comfortable on vent, no increased wob.  Diuresing well overnight  VITAL SIGNS: Temp:  [97.6 F (36.4 C)-98.2 F (36.8 C)] 97.6 F (36.4 C) (06/22 0427) Pulse Rate:  [73-111] 95 (06/22 0427) Resp:  [19-26] 24 (06/22 0427) BP: (105-152)/(53-78) 105/58 mmHg (06/22 0427) SpO2:  [97 %-99 %] 98 % (06/22 0505) FiO2 (%):  [40 %] 40 % (06/22 0800)  HEMODYNAMICS:    VENTILATOR SETTINGS: Vent Mode:  [-] PRVC FiO2 (%):  [40 %] 40 % Set Rate:  [20 bmp] 20 bmp Vt Set:  [400 mL] 400 mL PEEP:  [5 cmH20] 5 cmH20 Plateau Pressure:  [11 ION62-95 cmH20] 26 cmH20  INTAKE / OUTPUT: Intake/Output     06/21 0701 - 06/22 0700 06/22 0701 - 06/23 0700   Other     NG/GT     IV Piggyback     Total Intake(mL/kg)     Urine (mL/kg/hr) 2975 (1.2)    Stool     Total Output 2975     Net -2975           PHYSICAL EXAMINATION: General:  Chronically ill appearing female, NAD  Neuro:  Awake, easily arousable, poor movement of extremities HEENT:  Tracheostomy site intact, minimal secretions, nose without purulence Cardiovascular:  Rrr, sounds regular, no murmurs Lungs: basilar crackles, no active wheezing. Abdomen:  Obese, soft, bowel sounds present, nontender. Musculoskeletal:  No edema, PAS in place.  LABS: CBC  Recent Labs Lab 05/11/14 0500 05/12/14 0500 05/13/14 0730  WBC 11.5* 12.7* 11.6*  HGB 9.1* 8.4* 8.2*  HCT 32.2*  29.6* 29.6*  PLT 402* 416* 432*   BMET  Recent Labs Lab 05/12/14 0500 05/13/14 0730 05/14/14 0715  NA 144 147 146  K 3.6* 4.1 3.9  CL 95* 97 93*  CO2 39* 45* >45*  BUN 32* 31* 29*  CREATININE 0.25* 0.22* 0.20*  GLUCOSE 311* 171* 75     ASSESSMENT / PLAN:  PULMONARY A: Acute on chronic respiratory failure Respiratory muscle weakness secondary to cord compression / new T6 paraplegia  AECOPD Suspect OSA / OHS Tracheostomy status ?HCAP P:    - Will not wean, awaiting vent SNF placement, likely in AM. - Cont BD - Change solumedrol to PO  prednisone.  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   ENDOCRINE  A:   Hyperglycemia Hypothyroidism Suspect adrenal insufficiency-- on chronic low dose steroids P:   - SSI - Continue Lantus to 40u 6/20 - Taper steroids as able  - Synthroid - TSH check 7/2  NEUROLOGIC A:   T6 cord compression / paraplegia, s/p spinal cord decompression, still no LE movement Delirium, resolved Anxiety P:   - Vicodin / Fentanyl / Ativan PRN. - Cont hold celexa ( at risk for serotonin syndrome with Zyvox ). - Xanax as preadmission - increase 6/20 with frequent need PRN ativan. - PT/OT.  Is approved for vent SNF at [place] [facility] in [place] once medically ready for tx.  Likely in AM.  Rush Farmer, M.D. [facility] Pulmonary/Critical Care Medicine. Pager: [phone]. After hours pager: [phone].

## 2014-05-14 NOTE — Progress Notes (Addendum)
Sent updated clinicals at [facility] request for vent SNF placement. CSW following for discharge ASAP to facility, hopefully tomorrow.  Addendum: CSW followed up with [facility], and Medicaid rep working on getting pt's [place] Medicaid cancelled so [place] Medicaid can become active. Rep working on this now. Awaiting confirmation pt has a bed and insurance is secured.  Ky Barban, MSW, [facility] Clinical Social Worker [phone]

## 2014-05-14 NOTE — Progress Notes (Signed)
CRITICAL VALUE ALERT  Critical value received:  CO2>45  Date of notification:  05/14/2014  Time of notification:  0900  Critical value read back:yes  Nurse who received alert:  Marta Lamas RN  MD notified (1st page):  Dr. Alva Garnet  Time of first page:  0908  MD notified (2nd page):  Time of second page:  Responding MD:  Dr. Alva Garnet  Time MD responded:  [phone]

## 2014-05-14 NOTE — Progress Notes (Signed)
Inpatient Diabetes Program Recommendations  AACE/ADA: New Consensus Statement on Inpatient Glycemic Control (2013)  Target Ranges:  Prepandial:   less than 140 mg/dL      Peak postprandial:   less than 180 mg/dL (1-2 hours)      Critically ill patients:  140 - 180 mg/dL   Results for ICELYN, NAVARRETE (MRN [id number]) as of 05/14/2014 09:07  Ref. Range 05/13/2014 08:34 05/13/2014 11:22 05/13/2014 15:44 05/13/2014 20:07 05/14/2014 00:28 05/14/2014 04:24  Glucose-Capillary Latest Range: 70-99 mg/dL 159 (H) 266 (H) 217 (H) 148 (H) 111 (H) 73   Diabetes history: DM2 Outpatient Diabetes medications: Lantus 10 units BID, Metformin 500 mg BID Current orders for Inpatient glycemic control: Lantus 45 units QHS, Novolog 0-20 units Q4H, Novolog 6 units TID  Inpatient Diabetes Program Recommendations Insulin - Meal Coverage: Please consider changing tube feeding to Novolog 4 units Q4H. Basal: Continue to adjust basal insulin as steroids are tapered.  Thanks, Barnie Alderman, RN, MSN, CCRN Diabetes Coordinator Inpatient Diabetes Program [phone] (Team Pager) [phone] (AP office) [phone] [facility] office)

## 2014-05-14 NOTE — Progress Notes (Signed)
Physical Therapy Treatment Patient Details Name: Joanna Perez MRN: [id number] DOB: [birth year] Today's Date: 05/14/2014    History of Present Illness 58 yo  transferred from [facility] (adm s/p fall) for BLE paraplegia, found to have T6 fracture with spinal stenosis and spinal cord compression on MRI. She was taken for decompressive surgery (T5-8) and remained on mechanical ventilation post surgery. Pt now s/p trach and has been unable to wean off vent. Peg 04/26/14 PMHx- with COPD, CHF, DM, CAD s/p MI, atrial fibrillation; Pt on home 4L 02 .    PT Comments    Pt pre-medicated for pain and willing to work on upper body strengthening. Pt becomes anxious with exertion and then quickly calms with reassuring/calming cues. Requires incr time for rest/breathing between repetitions.  Follow Up Recommendations  SNF;Supervision/Assistance - 24 hour     Equipment Recommendations  None recommended by PT    Recommendations for Other Services       Precautions / Restrictions Precautions Precautions: Fall Precaution Comments: bil pillow prafo boots, vent, foley    Mobility  Bed Mobility Overal bed mobility: Needs Assistance;+2 for physical assistance             General bed mobility comments: total +2 to scoot to [facility] with bed in tilt position. Tolerated well re: SaO2 and HR. Pt then wanted to lean forward and put a pillow across her upper back. Pt pulled forward (from [facility] elevated 40) with mod assist and 2nd person placed pilllow  Transfers                    Ambulation/Gait                 Stairs            Wheelchair Mobility    Modified Rankin (Stroke Patients Only)       Balance                                    Cognition Arousal/Alertness: Awake/alert Behavior During Therapy: Anxious Overall Cognitive Status: Difficult to assess                      Exercises General Exercises - Upper Extremity Shoulder Flexion:  AROM;Left;AAROM;Right;Other reps (comment) (3 reps ea side with resisted shoulder extension (Pull down)) Shoulder Extension: Strengthening;Both;Other reps (comment) (3 reps, manual resistance) Elbow Flexion: Strengthening;Both;5 reps (manual resistance) Low Level/ICU Exercises Ankle Circles/Pumps: PROM;Both Heel Slides: PROM;Both    General Comments General comments (skin integrity, edema, etc.): Anxiety continues to limit her participation. She requires frequent reassuring cues and to slow her breathing. Responds well to calming techniques, however needs them repeatedly      Pertinent Vitals/Pain Reports upper thoracic pain; repositioned for comfort in more upright sitting with pillow to her upper back (pt request)    Home Living                      Prior Function            PT Goals (current goals can now be found in the care plan section) Acute Rehab PT Goals PT Goal Formulation: With patient Time For Goal Achievement: 05/28/14 Potential to Achieve Goals: Fair Progress towards PT goals: Not progressing toward goals - comment (+3 not available this date; goal update done)    Frequency  Min 2X/week  PT Plan Current plan remains appropriate    Co-evaluation             End of Session Equipment Utilized During Treatment: Oxygen (vent) Activity Tolerance: Patient limited by fatigue;Treatment limited secondary to medical complications (Comment) (anxiety) Patient left: in bed;with call bell/phone within reach     Time: 1629-1700 PT Time Calculation (min): 31 min  Charges:  $Therapeutic Exercise: 8-22 mins $Therapeutic Activity: 8-22 mins                    G Codes:      SASSER,LYNN 27-May-2014, 5:16 PM Pager [phone]

## 2014-05-14 NOTE — Progress Notes (Signed)
ANTIBIOTIC CONSULT NOTE - Follow-up  Pharmacy Consult for aztreonam/ linezolid (per MD) Indication: pneumonia  Allergies  Allergen Reactions  . Amoxicillin Hives  . Bactrim [Sulfamethoxazole-Tmp Ds] Hives  . Erythromycin Hives  . Keflex [Cephalexin] Hives  . Penicillins Itching and Swelling    Sweating    Patient Measurements: Height: 5\' 2"  (157.5 cm) Weight: 230 lb 13.2 oz (104.7 kg) IBW/kg (Calculated) : 50.1 Adjusted Body Weight: n/a  Vital Signs: Temp: 97.6 F (36.4 C) (06/22 0427) Temp src: Oral (06/22 0427) BP: 105/58 mmHg (06/22 0427) Pulse Rate: 95 (06/22 0427) Intake/Output from previous day: 06/21 0701 - 06/22 0700 In: 0  Out: 2975 [Urine:2975] Intake/Output from this shift:    Labs:  Recent Labs  05/12/14 0500 05/13/14 0730 05/14/14 0715  WBC 12.7* 11.6*  --   HGB 8.4* 8.2*  --   PLT 416* 432*  --   CREATININE 0.25* 0.22* 0.20*   Estimated Creatinine Clearance: 88.1 ml/min (by C-G formula based on Cr of 0.2). No results found for this basename: VANCOTROUGH, Corlis Leak, VANCORANDOM, GENTTROUGH, GENTPEAK, GENTRANDOM, TOBRATROUGH, TOBRAPEAK, TOBRARND, AMIKACINPEAK, AMIKACINTROU, AMIKACIN,  in the last 72 hours   Microbiology: Recent Results (from the past 720 hour(s))  CULTURE, RESPIRATORY (NON-EXPECTORATED)     Status: None   Collection Time    04/16/14  6:06 PM      Result Value Ref Range Status   Specimen Description TRACHEAL ASPIRATE   Final   Special Requests NONE   Final   Gram Stain     Final   Value: FEW WBC PRESENT,BOTH PMN AND MONONUCLEAR     RARE SQUAMOUS EPITHELIAL CELLS PRESENT     RARE GRAM POSITIVE COCCI     IN PAIRS     Performed at [facility]   Culture     Final   Value: ABUNDANT STENOTROPHOMONAS MALTOPHILIA     Note: SUSCEPTIBILITIES PERFORMED ON PREVIOUS CULTURE WITHIN THE LAST 5 DAYS.     Performed at [facility]   Report Status 04/21/2014 FINAL   Final  CULTURE, BLOOD (ROUTINE X 2)     Status: None   Collection Time    04/17/14  9:35 AM      Result Value Ref Range Status   Specimen Description BLOOD LEFT ANTECUBITAL   Final   Special Requests BOTTLES DRAWN AEROBIC ONLY 1CC   Final   Culture  Setup Time     Final   Value: 04/17/2014 14:03     Performed at [facility]   Culture     Final   Value: STAPHYLOCOCCUS SPECIES (COAGULASE NEGATIVE)     Note: SUSCEPTIBILITIES PERFORMED ON PREVIOUS CULTURE WITHIN THE LAST 5 DAYS.     Note: Gram Stain Report Called to,Read Back By and Verified With: DARA ANDERSON ON 04/18/2014 AT 6:30P BY Dennard Nip     Performed at [facility]   Report Status 04/20/2014 FINAL   Final  CULTURE, BLOOD (ROUTINE X 2)     Status: None   Collection Time    04/17/14  9:40 AM      Result Value Ref Range Status   Specimen Description BLOOD LEFT HAND   Final   Special Requests BOTTLES DRAWN AEROBIC ONLY 2CC   Final   Culture  Setup Time     Final   Value: 04/17/2014 14:03     Performed at [facility]   Culture     Final   Value: STAPHYLOCOCCUS SPECIES (COAGULASE NEGATIVE)  Note: RIFAMPIN AND GENTAMICIN SHOULD NOT BE USED AS SINGLE DRUGS FOR TREATMENT OF STAPH INFECTIONS.     Note: Gram Stain Report Called to,Read Back By and Verified With: DARA ANDERSON ON 04/18/2014 AT 6:30P BY Dennard Nip     Performed at [facility]   Report Status 04/20/2014 FINAL   Final   Organism ID, Bacteria STAPHYLOCOCCUS SPECIES (COAGULASE NEGATIVE)   Final  CULTURE, RESPIRATORY (NON-EXPECTORATED)     Status: None   Collection Time    04/17/14 11:55 AM      Result Value Ref Range Status   Specimen Description TRACHEAL ASPIRATE   Final   Special Requests Normal   Final   Gram Stain     Final   Value: ABUNDANT WBC PRESENT, PREDOMINANTLY PMN     RARE SQUAMOUS EPITHELIAL CELLS PRESENT     NO ORGANISMS SEEN     Performed at [facility]   Culture     Final   Value: ABUNDANT STENOTROPHOMONAS MALTOPHILIA     Performed at [facility]   Report  Status 04/21/2014 FINAL   Final   Organism ID, Bacteria STENOTROPHOMONAS MALTOPHILIA   Final  CULTURE, BLOOD (ROUTINE X 2)     Status: None   Collection Time    04/21/14  3:20 AM      Result Value Ref Range Status   Specimen Description BLOOD LEFT ARM   Final   Special Requests BOTTLES DRAWN AEROBIC ONLY 10CC   Final   Culture  Setup Time     Final   Value: 04/21/2014 11:23     Performed at [facility]   Culture     Final   Value: NO GROWTH 5 DAYS     Performed at [facility]   Report Status 04/27/2014 FINAL   Final  CULTURE, BLOOD (ROUTINE X 2)     Status: None   Collection Time    04/21/14  3:25 AM      Result Value Ref Range Status   Specimen Description BLOOD LEFT ARM   Final   Special Requests BOTTLES DRAWN AEROBIC ONLY 10CC   Final   Culture  Setup Time     Final   Value: 04/21/2014 11:23     Performed at [facility]   Culture     Final   Value: NO GROWTH 5 DAYS     Performed at [facility]   Report Status 04/27/2014 FINAL   Final  MRSA PCR SCREENING     Status: Abnormal   Collection Time    05/07/14  4:05 PM      Result Value Ref Range Status   MRSA by PCR POSITIVE (*) NEGATIVE Final   Comment:            The GeneXpert MRSA Assay (FDA     approved for NASAL specimens     only), is one component of a     comprehensive MRSA colonization     surveillance program. It is not     intended to diagnose MRSA     infection nor to guide or     monitor treatment for     MRSA infections.     RESULT CALLED TO, READ BACK BY AND VERIFIED WITH:     S.SHAW,RN AT 1908 BY L.PITT 05/07/14  CLOSTRIDIUM DIFFICILE BY PCR     Status: None   Collection Time    05/07/14 10:02 PM      Result  Value Ref Range Status   C difficile by pcr NEGATIVE  NEGATIVE Final   Assessment: 58 yo female with paraplegia with Coag-negative S. Bacteremia and MDR Stenotroph - on vancomycin for 2 weeks, stopped 6/13. Pt is paraplegic so SCr likely falsely low but good UOP.  She continues on aztreonam + linezolid for HCAP. Doses are appropriate.   Vanc 5/17 >> 6/13 Primaxin 5/28 >> 6/1 Linezolid 6/19 > Aztreonam 6/19 >  5/22 VT = 11.1> increase to 1g/12 5/24 VT 15.1 >> continue 5/31 VT 33.8 on 1 g IV q12h> changed to 1 g IV q24h 6/4 VT 12.5 need goal close to 20 d/t Vanc MIC of 2 > change to 1g q18h 6/7 VT 16.8 drawn 1 hour early. True trough likely lower > 750 mg q 12h  6/10 VT 22.5 drawn 1 hour early > cont 750/12h  5/16 BCx- GPC 2/2 (coag negative staph) MIC to Vanc = 2 5/25 Trach asp-Steno 5/26 BCx-CoNS (MIC to Vanc = 1) 5/26 Trach asp-Stenotrophomonas (R- LQ, Bactrim) 5/30 BCx-neg 6/15 MRSA: Pos 6/15 Cdiff - NEG   Goal of Therapy:  Resolution of infection  Plan:  1. Continue aztreonam 1gm IV Q8H  2. Continue linezolid per MD 3. F/u renal fxn, C&S, clinical status and planned LOT  Salome Arnt, PharmD, BCPS Pager # [phone] 05/14/2014 11:37 AM

## 2014-05-15 LAB — BASIC METABOLIC PANEL
BUN: 25 mg/dL — ABNORMAL HIGH (ref 6–23)
CHLORIDE: 89 meq/L — AB (ref 96–112)
CO2: >45 mEq/L (ref 19–32)
Calcium: 9.7 mg/dL (ref 8.4–10.5)
Creatinine, Ser: <0.2 mg/dL — ABNORMAL LOW (ref 0.50–1.10)
Glucose, Bld: 70 mg/dL (ref 70–99)
POTASSIUM: 4 meq/L (ref 3.7–5.3)
Sodium: 144 mEq/L (ref 137–147)

## 2014-05-15 LAB — GLUCOSE, CAPILLARY
GLUCOSE-CAPILLARY: 185 mg/dL — AB (ref 70–99)
GLUCOSE-CAPILLARY: 193 mg/dL — AB (ref 70–99)
Glucose-Capillary: 123 mg/dL — ABNORMAL HIGH (ref 70–99)
Glucose-Capillary: 178 mg/dL — ABNORMAL HIGH (ref 70–99)
Glucose-Capillary: 39 mg/dL — CL (ref 70–99)
Glucose-Capillary: 68 mg/dL — ABNORMAL LOW (ref 70–99)
Glucose-Capillary: 76 mg/dL (ref 70–99)

## 2014-05-15 LAB — CBC
HCT: 29.8 % — ABNORMAL LOW (ref 36.0–46.0)
Hemoglobin: 8.6 g/dL — ABNORMAL LOW (ref 12.0–15.0)
MCH: 25.4 pg — ABNORMAL LOW (ref 26.0–34.0)
MCHC: 28.9 g/dL — ABNORMAL LOW (ref 30.0–36.0)
MCV: 88.2 fL (ref 78.0–100.0)
PLATELETS: 428 10*3/uL — AB (ref 150–400)
RBC: 3.38 MIL/uL — ABNORMAL LOW (ref 3.87–5.11)
RDW: 21.4 % — ABNORMAL HIGH (ref 11.5–15.5)
WBC: 10.6 10*3/uL — AB (ref 4.0–10.5)

## 2014-05-15 LAB — PHOSPHORUS: Phosphorus: 2.9 mg/dL (ref 2.3–4.6)

## 2014-05-15 LAB — MAGNESIUM: MAGNESIUM: 1.5 mg/dL (ref 1.5–2.5)

## 2014-05-15 MED ORDER — INSULIN ASPART 100 UNIT/ML ~~LOC~~ SOLN
3.0000 [IU] | Freq: Three times a day (TID) | SUBCUTANEOUS | Status: DC
  Administered 2014-05-15 – 2014-05-16 (×4): 3 [IU] via SUBCUTANEOUS

## 2014-05-15 MED ORDER — DEXTROSE 50 % IV SOLN
INTRAVENOUS | Status: AC
  Administered 2014-05-15: 25 mL
  Filled 2014-05-15: qty 50

## 2014-05-15 MED ORDER — INSULIN GLARGINE 100 UNIT/ML ~~LOC~~ SOLN
35.0000 [IU] | Freq: Every day | SUBCUTANEOUS | Status: DC
  Administered 2014-05-15 – 2014-05-17 (×3): 35 [IU] via SUBCUTANEOUS
  Filled 2014-05-15 (×4): qty 0.35

## 2014-05-15 NOTE — Progress Notes (Signed)
Attempted to wean patient this am and she lasted about 20 minutes on cpap 5/5 until she became very anxious and said she was not getting enough air.  Attempted to calm patient down and encouraged her that she was doing well but was unable to do so, placed patient back on rest mode.  Will continue to monitor.

## 2014-05-15 NOTE — Progress Notes (Signed)
PULMONARY / CRITICAL CARE MEDICINE  Name: Joanna Perez MRN: [id number] DOB: [birth year]    ADMISSION DATE:  04/07/2014 CONSULTATION DATE:  04/09/2014  REFERRING MD :  Dr. Annette Stable PRIMARY SERVICE:  PCCM  CHIEF COMPLAINT:  Paraplegia   BRIEF PATIENT DESCRIPTION: 58 yo with COPD, CHF, DM, CAD s/p MI, atrial fibrillation transferred from [facility] for BLE paraplegia, found to have T6 fracture with spinal stenosis and spinal cord compression on MRI. She was taken for decompressive surgery and remained on mechanical ventilation post surgery. Tracheostomy placed 5/26.  Improved and tx to SDU for vent weaning.  On 6/15 developed hypotension requiring pressors and tx back to ICU.  Prolonged hospitalization with prolonged vent needs.    SIGNIFICANT EVENTS / STUDIES:  5/16  Admitted to [facility], hypotensive and with new paraplegia 5/17  Transfer to [facility] 5/17  MR Cervical/Thoracic/Lumbar Spine - worsened T6 compression fracture with paraspinal and epidural hematoma causing cord compression and edema, possible early cord infarct 5/18  Intubation for procedure 5/18  Thoracic laminectomy 5/18  Extubated 5/19  Somnolent / hypercarbic >>> reintubated 5/26  Tracheostomy 5/28  TTE >>> Ef 65%, vegetation is not reported 6/04  G-tube placement 6/10  Transfused PRBC 6/15  Transferred to ICU for vasopressors 6/17  BP improved with fluids / stress dose steroids, vasopressors off 6/19  Respiratory distress / hypoxemia / increased airspace disease 6/23  Remains on full support, hypoglycemic episode overnight   LINES / TUBES: ETT 5/18 >>> 5/18; 5/19 >>>5/26 R R AL 5/17 >>> out Foley 5/16 >>> R PICC 5/22 >>> 5/28 Trach 5/26 (DF) >>> PEG 6/4 >>>  CULTURES: 5/16  Blood >>>  COAG NEG STAPH 5/16  Urine >>> neg 5/25 Sputum >  STENOTROPHOMONAS MALTOPHILIA 5/26  Sputum >>> STENOTROPHOMONAS MALTOPHILIA 5/26  Blood >>> COAG NEG STAPH 5/30  Blood  >>> neg  ANTIBIOTICS per ID: Vancomycin 5/17  >>>6/13 Levaquin 5/17 >>> 5/19 Imipenem 5/28 >>> 6/1 Zyvox 6/19 >>> Aztreonam 6/19 >>>  INTERVAL HISTORY:  Hypoglycemia episode overnight, no other acute events.   VITAL SIGNS: Temp:  [98.2 F (36.8 C)-98.5 F (36.9 C)] 98.3 F (36.8 C) (06/23 1200) Pulse Rate:  [95-107] 102 (06/23 1200) Resp:  [19-27] 21 (06/23 1200) BP: (101-131)/(43-64) 102/43 mmHg (06/23 1200) SpO2:  [95 %-100 %] 97 % (06/23 1200) FiO2 (%):  [40 %] 40 % (06/23 1200) Weight:  [230 lb 13.2 oz (104.7 kg)] 230 lb 13.2 oz (104.7 kg) (06/23 0357)  HEMODYNAMICS:    VENTILATOR SETTINGS: Vent Mode:  [-] PRVC FiO2 (%):  [40 %] 40 % Set Rate:  [20 bmp] 20 bmp Vt Set:  [400 mL] 400 mL PEEP:  [5 cmH20] 5 cmH20 Plateau Pressure:  [21 cmH20-26 cmH20] 25 cmH20  INTAKE / OUTPUT: Intake/Output     06/22 0701 - 06/23 0700 06/23 0701 - 06/24 0700   NG/GT 550    IV Piggyback 400    Total Intake(mL/kg) 950 (9.1)    Urine (mL/kg/hr) 3680 (1.5)    Total Output 3680     Net -2730           PHYSICAL EXAMINATION: General:  Chronically ill appearing female, NAD  Neuro:  Awake, alert, poor movement of extremities HEENT:  Tracheostomy site intact, minimal secretions, nose without purulence Cardiovascular:  RRR, no murmurs Lungs: resp's even/non-labored, lungs bilaterally with faint crackles lower,  no active wheezing. Abdomen:  Obese, soft, bowel sounds present, nontender. Musculoskeletal:  No edema, PAS in place.  LABS: CBC  Recent Labs Lab 05/12/14 0500 05/13/14 0730 05/15/14 0500  WBC 12.7* 11.6* 10.6*  HGB 8.4* 8.2* 8.6*  HCT 29.6* 29.6* 29.8*  PLT 416* 432* 428*   BMET  Recent Labs Lab 05/13/14 0730 05/14/14 0715 05/15/14 0500  NA 147 146 144  K 4.1 3.9 4.0  CL 97 93* 89*  CO2 45* >45* >45*  BUN 31* 29* 25*  CREATININE 0.22* 0.20* <0.20*  GLUCOSE 171* 75 70     ASSESSMENT / PLAN:  PULMONARY A: Acute on chronic respiratory failure Respiratory muscle weakness secondary to cord  compression / new T6 paraplegia  AECOPD Suspect OSA / OHS Tracheostomy status ?HCAP P:    - Will not wean, awaiting vent SNF placement pending insurance approval - Cont BD - Continue PO prednisone, reduce to 20mg  6/23  CARDIOVASCULAR A:  Chronic diastolic heart failure P:  - Continue lasix 40 mg IV daily  GASTROINTESTINAL A:   Nutrition GERD Diarrhea - CDiff neg  S/p PEG 6/4 P:   - TF - No free water  INFECTIOUS A:   Bacteremia with COAG NEG STAPH STENOTROPHOMONAS MALTOPHILIA, 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 Early stage II decubitus sacrum New HCAP 6/19 P:  - Cont Aztreonam / Zyvox as above  - No culture data available.   ENDOCRINE  A:   Hyperglycemia Hypothyroidism Suspect adrenal insufficiency -- on chronic low dose steroids P:   - SSI - Reduce Lantus to 35u 6/23 & meal coverage to 3 units> hypoglycemia episodes 6/23 - Taper steroids, baseline 10 mg QD - Synthroid - TSH check 7/2  NEUROLOGIC A:   T6 cord compression / paraplegia, s/p spinal cord decompression, still no LE movement Delirium, resolved Anxiety P:   - Vicodin / Fentanyl / Ativan PRN. - Cont hold celexa ( at risk for serotonin syndrome with Zyvox ). - Xanax as preadmission - increase 6/20 with frequent need PRN ativan. - PT/OT.  Is approved for vent SNF at [facility] in [place] once medically ready for tx & insurance approved.     Noe Gens, NP-C [facility] Pgr: [phone] or [phone]  Patient seen and examined, agree with above note.  I dictated the care and orders written for this patient under my direction.  Rush Farmer, MD [phone]

## 2014-05-15 NOTE — Progress Notes (Signed)
Occupational Therapy Treatment Patient Details Name: Joanna Perez MRN: [id number] DOB: October 26, 1956 Today's Date: 05/15/2014    History of present illness 58 yo  transferred from [facility] (adm s/p fall) for BLE paraplegia, found to have T6 fracture with spinal stenosis and spinal cord compression on MRI. She was taken for decompressive surgery (T5-8) and remained on mechanical ventilation post surgery. Pt now s/p trach and has been unable to wean off vent. Peg 04/26/14 PMHx- with COPD, CHF, DM, CAD s/p MI, atrial fibrillation; Pt on home 4L 02 .   OT comments  Pt noted to have rash throughout entire body this session. Pt with incr RR 40 with UB/ LB exercises. Pt requires frequent rest breaks. Ot to continue to follow acutely and focusing on scapula retraction this session due to Lt UE pain with movement.   Follow Up Recommendations  SNF;Supervision/Assistance - 24 hour    Equipment Recommendations  Wheelchair (measurements OT);Wheelchair cushion (measurements OT);Hospital bed (mattress overlay)    Recommendations for Other Services      Precautions / Restrictions Precautions Precautions: Fall Precaution Comments: bil pillow prafo boots, vent, foley       Mobility Bed Mobility                  Transfers                      Balance                                   ADL                                         General ADL Comments: Pt is total (A) for all adls. pt is unable to reposition in the bed currently. pt made several efforts during session to use BIL UE to pull on rails and shift weight with no success. pt requries total +2 total (A) to shift toward left side of bed      Vision                     Perception     Praxis      Cognition   Behavior During Therapy: Anxious Overall Cognitive Status: Difficult to assess                       Extremity/Trunk Assessment                Exercises General Exercises - Upper Extremity Shoulder Flexion: AAROM;Both;10 reps;Prone (hob up) Shoulder ABduction: AAROM;Both;10 reps;Supine (hob up) Elbow Flexion: AAROM;Both;10 reps;Supine General Exercises - Lower Extremity Ankle Circles/Pumps: PROM;Both;10 reps;Supine Heel Slides: PROM;Both;10 reps Hip ABduction/ADduction: PROM;10 reps;Both;Supine Straight Leg Raises: PROM;Both;10 reps;Supine Hip Flexion/Marching: PROM;Both;10 reps;Supine Other Exercises Other Exercises: hip external rotation passive stretch (hold for 15 seconds) Other Exercises: scapula retraction and protraction supine- Ot uses hand placement to help facilitate activation by patient. Pt able to hold muscle activation ~ 10 seconds. pt holding breath and needing cues to breath. pt c/o Lt UE pain with ROM so scapula mobilization is important to help with decr pain   Shoulder Instructions       General Comments      Pertinent Vitals/ Pain       RR 40  C/o Headache s/p session- RN [facility] notified  Home Living                                          Prior Functioning/Environment              Frequency Min 2X/week     Progress Toward Goals  OT Goals(current goals can now be found in the care plan section)  Progress towards OT goals: Progressing toward goals  Acute Rehab OT Goals Patient Stated Goal: none stated at this time OT Goal Formulation: With patient Time For Goal Achievement: 05/29/14 Potential to Achieve Goals: Fair ADL Goals Pt Will Perform Grooming: with min guard assist;bed level Pt Will Perform Upper Body Bathing: with min assist;bed level Pt/caregiver will Perform Home Exercise Program: Increased strength;Both right and left upper extremity;With theraband;With minimal assist Additional ADL Goal #1: Pt will sit EOB max (A) using BIL UE for support  Additional ADL Goal #2: Pt will tolerate EOB sitting for 5 minutes with VSS  Plan Discharge plan remains  appropriate    Co-evaluation                 End of Session     Activity Tolerance Patient tolerated treatment well   Patient Left in bed;with call bell/phone within reach   Nurse Communication Mobility status;Precautions        Time: 3086-5784 OT Time Calculation (min): 25 min  Charges: OT General Charges $OT Visit: 1 Procedure OT Treatments $Therapeutic Exercise: 23-37 mins  Peri Maris 05/15/2014, 9:46 AM Pager: [phone]

## 2014-05-15 NOTE — Progress Notes (Signed)
Inpatient Diabetes Program Recommendations  AACE/ADA: New Consensus Statement on Inpatient Glycemic Control (2013)  Target Ranges:  Prepandial:   less than 140 mg/dL      Peak postprandial:   less than 180 mg/dL (1-2 hours)      Critically ill patients:  140 - 180 mg/dL   Results for MALAYIA, SPIZZIRRI (MRN [id number]) as of 05/15/2014 09:12  Ref. Range 05/15/2014 00:36 05/15/2014 04:34 05/15/2014 04:37 05/15/2014 05:10 05/15/2014 08:13  Glucose-Capillary Latest Range: 70-99 mg/dL 123 (H) 43 (LL) 39 (LL) 76 68 (L)   Results for MAUDY, YONAN (MRN [id number]) as of 05/15/2014 09:12  Ref. Range 05/14/2014 00:28 05/14/2014 04:24 05/14/2014 08:06 05/14/2014 12:01 05/14/2014 16:49 05/14/2014 20:25  Glucose-Capillary Latest Range: 70-99 mg/dL 111 (H) 73 73 73 120 (H) 141 (H)   Diabetes history: DM2  Outpatient Diabetes medications: Lantus 10 units BID, Metformin 500 mg BID  Current orders for Inpatient glycemic control: Lantus 40 units QHS, Novolog 0-20 units Q4H, Novolog 6 units TID  Inpatient Diabetes Program Recommendations Insulin - Basal: Noted hypoglycemia this morning with CBG of 39 mg/dl at 4:37 am.  Also noted steroids have continued to be tapered. Please decrease Lantus to 25 units QHS and continue to adjust as needed. Insulin - Meal Coverage: Please consider changing tube feeding to Novolog 4 units Q4H.  Thanks, Barnie Alderman, RN, MSN, CCRN Diabetes Coordinator Inpatient Diabetes Program [phone] (Team Pager) [phone] (AP office) [phone] [facility] office)

## 2014-05-15 NOTE — Progress Notes (Signed)
Medicaid rep with [facility] in [place] states they are waiting to hear back from pt's DSS case manager to have her SSI Medicaid cancelled so [place] can pay her vent SNF stay. Facility needs to have a verbal confirmation from DSS that her Medicaid has been cancelled--[facility] rep to contact CSW when this has been completed. CSW will discharge pt as soon as insurance is in place and facility has a bed.   Ky Barban, MSW, [facility] Clinical Social Worker [phone]

## 2014-05-15 NOTE — Progress Notes (Signed)
Hypoglycemic Event  CBG: 39  Treatment: D50 1/2 ampule  Symptoms: none  Follow-up CBG: Time: 0511 CBG Result: 76  Possible Reasons for Event: Tube feeding leaked a bit  Comments/MD notified:    Joanna Perez, Joanna Perez  Remember to initiate Hypoglycemia Order Set & complete

## 2014-05-16 ENCOUNTER — Inpatient Hospital Stay (HOSPITAL_COMMUNITY): Payer: Medicaid Other

## 2014-05-16 LAB — BASIC METABOLIC PANEL
BUN: 24 mg/dL — ABNORMAL HIGH (ref 6–23)
CALCIUM: 9.3 mg/dL (ref 8.4–10.5)
CHLORIDE: 90 meq/L — AB (ref 96–112)
Creatinine, Ser: <0.2 mg/dL — ABNORMAL LOW (ref 0.50–1.10)
Glucose, Bld: 83 mg/dL (ref 70–99)
Potassium: 4.2 mEq/L (ref 3.7–5.3)
Sodium: 144 mEq/L (ref 137–147)

## 2014-05-16 LAB — CBC
HCT: 30.3 % — ABNORMAL LOW (ref 36.0–46.0)
Hemoglobin: 8.7 g/dL — ABNORMAL LOW (ref 12.0–15.0)
MCH: 25.9 pg — ABNORMAL LOW (ref 26.0–34.0)
MCHC: 28.7 g/dL — ABNORMAL LOW (ref 30.0–36.0)
MCV: 90.2 fL (ref 78.0–100.0)
PLATELETS: 410 10*3/uL — AB (ref 150–400)
RBC: 3.36 MIL/uL — AB (ref 3.87–5.11)
RDW: 21.2 % — ABNORMAL HIGH (ref 11.5–15.5)
WBC: 9.7 10*3/uL (ref 4.0–10.5)

## 2014-05-16 LAB — GLUCOSE, CAPILLARY
GLUCOSE-CAPILLARY: 106 mg/dL — AB (ref 70–99)
GLUCOSE-CAPILLARY: 112 mg/dL — AB (ref 70–99)
GLUCOSE-CAPILLARY: 74 mg/dL (ref 70–99)
Glucose-Capillary: 129 mg/dL — ABNORMAL HIGH (ref 70–99)
Glucose-Capillary: 131 mg/dL — ABNORMAL HIGH (ref 70–99)
Glucose-Capillary: 189 mg/dL — ABNORMAL HIGH (ref 70–99)
Glucose-Capillary: 89 mg/dL (ref 70–99)

## 2014-05-16 MED ORDER — PRO-STAT SUGAR FREE PO LIQD
30.0000 mL | Freq: Three times a day (TID) | ORAL | Status: DC
  Administered 2014-05-16 – 2014-05-17 (×5): 30 mL
  Filled 2014-05-16 (×8): qty 30

## 2014-05-16 MED ORDER — VITAL HIGH PROTEIN PO LIQD
1000.0000 mL | ORAL | Status: DC
  Administered 2014-05-17: 1000 mL
  Filled 2014-05-16 (×4): qty 1000

## 2014-05-16 NOTE — Progress Notes (Signed)
eLink Physician-Brief Progress Note Patient Name: Joanna Perez DOB: [birth year] MRN: [id number]  Date of Service  05/16/2014   HPI/Events of Note   Asymptomatic 10 beat run SVT  eICU Interventions  mag and phios check   Intervention Category Minor Interventions: Other:  RAMASWAMY,MURALI 05/16/2014, 9:39 PM

## 2014-05-16 NOTE — Progress Notes (Signed)
PULMONARY / CRITICAL CARE MEDICINE  Name: Joanna Perez MRN: [id number] DOB: 12-Feb-1956    ADMISSION DATE:  04/07/2014 CONSULTATION DATE:  04/09/2014  REFERRING MD :  Dr. Annette Stable PRIMARY SERVICE:  PCCM  CHIEF COMPLAINT:  Paraplegia   BRIEF PATIENT DESCRIPTION: 58 yo with COPD, CHF, DM, CAD s/p MI, atrial fibrillation transferred from [facility] for BLE paraplegia, found to have T6 fracture with spinal stenosis and spinal cord compression on MRI. She was taken for decompressive surgery and remained on mechanical ventilation post surgery. Tracheostomy placed 5/26.  Improved and tx to SDU for vent weaning.  On 6/15 developed hypotension requiring pressors and tx back to ICU.  Prolonged hospitalization with prolonged vent needs.    SIGNIFICANT EVENTS / STUDIES:  5/16  Admitted to [facility], hypotensive and with new paraplegia 5/17  Transfer to [facility] 5/17  MR Cervical/Thoracic/Lumbar Spine - worsened T6 compression fracture with paraspinal and epidural hematoma causing cord compression and edema, possible early cord infarct 5/18  Intubation for procedure 5/18  Thoracic laminectomy 5/18  Extubated 5/19  Somnolent / hypercarbic >>> reintubated 5/26  Tracheostomy 5/28  TTE >>> Ef 65%, vegetation is not reported 6/04  G-tube placement 6/10  Transfused PRBC 6/15  Transferred to ICU for vasopressors 6/17  BP improved with fluids / stress dose steroids, vasopressors off 6/19  Respiratory distress / hypoxemia / increased airspace disease 6/23  Remains on full support, hypoglycemic episode overnight   LINES / TUBES: ETT 5/18 >>> 5/18; 5/19 >>>5/26 R R AL 5/17 >>> out Foley 5/16 >>> R PICC 5/22 >>> 5/28 Trach 5/26 (DF) >>> PEG 6/4 >>>  CULTURES: 5/16  Blood >>>  COAG NEG STAPH 5/16  Urine >>> neg 5/25 Sputum >  STENOTROPHOMONAS MALTOPHILIA 5/26  Sputum >>> STENOTROPHOMONAS MALTOPHILIA 5/26  Blood >>> COAG NEG STAPH 5/30  Blood  >>> neg  ANTIBIOTICS per ID: Vancomycin 5/17  >>>6/13 Levaquin 5/17 >>> 5/19 Imipenem 5/28 >>> 6/1 Zyvox 6/19 >>> Aztreonam 6/19 >>>  INTERVAL HISTORY:  No events overnight, remains on vent, not weaning.  VITAL SIGNS: Temp:  [98 F (36.7 C)-98.8 F (37.1 C)] 98.5 F (36.9 C) (06/24 0804) Pulse Rate:  [87-107] 105 (06/24 0804) Resp:  [18-22] 21 (06/24 0804) BP: (102-132)/(43-61) 131/59 mmHg (06/24 0804) SpO2:  [95 %-100 %] 96 % (06/24 0804) FiO2 (%):  [40 %] 40 % (06/24 0804) Weight:  [208 lb 5.4 oz (94.5 kg)] 208 lb 5.4 oz (94.5 kg) (06/24 0600)  HEMODYNAMICS:    VENTILATOR SETTINGS: Vent Mode:  [-] PRVC FiO2 (%):  [40 %] 40 % Set Rate:  [20 bmp] 20 bmp Vt Set:  [400 mL] 400 mL PEEP:  [5 cmH20] 5 cmH20 Plateau Pressure:  [24 cmH20-27 cmH20] 27 cmH20  INTAKE / OUTPUT: Intake/Output     06/23 0701 - 06/24 0700 06/24 0701 - 06/25 0700   NG/GT 600    IV Piggyback 400    Total Intake(mL/kg) 1000 (10.6)    Urine (mL/kg/hr) 3200 (1.4) 750 (2.1)   Total Output 3200 750   Net -2200 -750         PHYSICAL EXAMINATION: General:  Chronically ill appearing female, NAD  Neuro:  Awake, alert, poor movement of extremities HEENT:  Tracheostomy site intact, minimal secretions, nose without purulence Cardiovascular:  RRR, no murmurs Lungs: resp's even/non-labored, lungs bilaterally with faint crackles lower,  no active wheezing. Abdomen:  Obese, soft, bowel sounds present, nontender. Musculoskeletal:  No edema, PAS in place.  LABS:  CBC  Recent Labs Lab 05/13/14 0730 05/15/14 0500 05/16/14 0500  WBC 11.6* 10.6* 9.7  HGB 8.2* 8.6* 8.7*  HCT 29.6* 29.8* 30.3*  PLT 432* 428* 410*   BMET  Recent Labs Lab 05/14/14 0715 05/15/14 0500 05/16/14 0500  NA 146 144 144  K 3.9 4.0 4.2  CL 93* 89* 90*  CO2 >45* >45* >45*  BUN 29* 25* 24*  CREATININE 0.20* <0.20* <0.20*  GLUCOSE 75 70 83     ASSESSMENT / PLAN:  PULMONARY A: Acute on chronic respiratory failure Respiratory muscle weakness secondary to cord  compression / new T6 paraplegia  AECOPD Suspect OSA / OHS Tracheostomy status ?HCAP P:    - Will not wean, awaiting vent SNF placement pending insurance approval - Cont BD - Continue PO prednisone, reduce to 20mg  6/23  CARDIOVASCULAR A:  Chronic diastolic heart failure P:  - Continue lasix 40 mg IV daily  GASTROINTESTINAL A:   Nutrition GERD Diarrhea - CDiff neg  S/p PEG 6/4 P:   - TF - No free water  INFECTIOUS A:   Bacteremia with COAG NEG STAPH STENOTROPHOMONAS MALTOPHILIA, 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 - No culture data available.   ENDOCRINE  A:   Hyperglycemia Hypothyroidism Suspect adrenal insufficiency -- on chronic low dose steroids P:   - SSI. - Reduce Lantus to 35u 6/23 & meal coverage to 3 units> hypoglycemia episodes 6/23. - Taper steroids, baseline 10 mg QD. - Synthroid. - TSH check 7/2.  NEUROLOGIC A:   T6 cord compression / paraplegia, s/p spinal cord decompression, still no LE movement Delirium, resolved Anxiety P:   - Vicodin / Fentanyl / Ativan PRN. - Cont hold celexa ( at risk for serotonin syndrome with Zyvox ). - Xanax as preadmission - increase 6/20 with frequent need PRN ativan. - PT/OT.  Is approved for vent SNF at [facility] in [place] once medically ready for tx & insurance approved.     Rush Farmer, M.D. [facility] Pulmonary/Critical Care Medicine. Pager: [phone]. After hours pager: [phone].

## 2014-05-16 NOTE — Progress Notes (Signed)
Received call from pt's husband asking for update. CSW explained yesterday CSW spoke with Medicaid rep with [facility] and the facility needs verbal confirmation from her  Medicaid case worker stating her SSI Medicaid has been cancelled. [facility], has confirmed with worker that the case has been closed, but the business office needs verbal confirmation from the [facility] worker that this is the case and her [place] Medicaid will be active once she gets to the facility. CSW e-mailed the [place] Medicaid rep and [facility] admissions coordinator asking for updates concerning her insurance, as the facility is still shooting for accepting her this week. CSW will update pt's husband as soon as CSW gets confirmation the insurance is handled and she has a bed at SNF.   Ky Barban, MSW, [facility] Clinical Social Worker [phone]

## 2014-05-16 NOTE — Progress Notes (Addendum)
NUTRITION FOLLOW UP  DOCUMENTATION CODES Per approved criteria  -Obesity Unspecified   INTERVENTION: Decrease Vital HP formula to goal rate of 35 ml/hr Prostat liquid protein 30 ml TID via tube  Total TF regimen to provide 1140 kcals (68% of re-estimated kcal needs), 118 gm protein (100% of estimated protein needs), 702 ml of free water RD to follow for nutrition care plan  NUTRITION DIAGNOSIS: Inadequate oral intake related to inability to eat as evidenced by NPO status, ongoing  Goal: Enteral nutrition to provide 60-70% of estimated calorie needs (22-25 kcals/kg ideal body weight) and 100% of estimated protein needs, based on ASPEN guidelines for permissive underfeeding in critically ill obese individuals, currently unmet  Monitor:  TF regimen & tolerance, respiratory status, weight, labs, I/O's  ASSESSMENT: Pt admitted with altered mental status and COPD exacerbation. Pt is status post thoracic decompressive surgery for severe myelopathy with subacute paraplegia on 5/18. Per MD note prognosis for neurological recovery is very poor.   Patient s/p procedure 6/4: IR GASTROSTOMY TUBE   Patient transferred back to 2C-Stepdown 6/20.  Patient is currently on ventilator support via trach MV: 8.2 L/min Temp (24hrs), Avg:98.4 F (36.9 C), Min:98 F (36.7 C), Max:98.8 F (37.1 C)   Vital HP formula continues to infuse at 50 ml/hr via G-tube with Prostat liquid protein 30 ml daily providing 1300 kcals (78% of re-estimated kcal needs), 120 gm protein (100% of re-estimated protein needs), 1003 ml of free water.  Disposition: awaiting vent SNF placement pending insurance approval.  Patient is currently receiving Zyvox, a medication with potential interactions with high tyramine-containing foods.  RD will readdress if pt continues on this medication and is advanced to a PO diet.  Height: Ht Readings from Last 1 Encounters:  04/09/14 5\' 2"  (1.575 m)    Weight -----> fluctuating but  stable Wt Readings from Last 1 Encounters:  05/16/14 208 lb 5.4 oz (94.5 kg)    6/23  230 lb 6/20  230 lb 6/19  228 lb 6/18  227 lb 6/17  220 lb 6/16  210 lb 6/15  215 lb 6/14  217 lb 6/13  220 lb 6/12  220 lb 6/11  214 lb 6/10  214 lb 6/09  209 lb 6/07  208 lb  BMI:  Body mass index is 38.1 kg/(m^2).  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; R buttock & ischial tuberosity ulceration  Diet Order: NPO   Intake/Output Summary (Last 24 hours) at 05/16/14 1141 Last data filed at 05/16/14 0853  Gross per 24 hour  Intake   1000 ml  Output   3950 ml  Net  -2950 ml    Labs:   Recent Labs Lab 05/11/14 0500 05/12/14 0500  05/14/14 0715 05/15/14 0500 05/16/14 0500  NA 145 144  < > 146 144 144  K 4.5 3.6*  < > 3.9 4.0 4.2  CL 102 95*  < > 93* 89* 90*  CO2 37* 39*  < > >45* >45* >45*  BUN 31* 32*  < > 29* 25* 24*  CREATININE 0.27* 0.25*  < > 0.20* <0.20* <0.20*  CALCIUM 9.9 10.2  < > 9.8 9.7 9.3  MG  --  1.6  --   --  1.5  --   PHOS  --  2.5  --   --  2.9  --   GLUCOSE 223* 311*  < > 75  70 83  < > = values in this interval not displayed.  CBG (last 3)   Recent Labs  05/16/14 0410 05/16/14 0736 05/16/14 1113  GLUCAP 112* 74 129*    Scheduled Meds: . ALPRAZolam  1.25 mg Oral TID  . antiseptic oral rinse  15 mL Mouth Rinse QID  . aztreonam  1 g Intravenous 3 times per day  . budesonide (PULMICORT) nebulizer solution  0.5 mg Nebulization BID  . chlorhexidine  15 mL Mouth Rinse BID  . cholecalciferol  400 Units Oral Daily  . feeding supplement (PRO-STAT SUGAR FREE 64)  30 mL Per Tube Daily  . furosemide  40 mg Intravenous Daily  . insulin aspart  0-20 Units Subcutaneous 6 times per day  . insulin aspart  3 Units Subcutaneous TID WC  . insulin glargine  35 Units Subcutaneous QHS  . ipratropium-albuterol  3 mL Nebulization Q4H  . levothyroxine  137  mcg Oral QAC breakfast  . linezolid  600 mg Intravenous Q12H  . pantoprazole sodium  40 mg Per Tube Daily  . predniSONE  20 mg Per Tube Q breakfast  . sodium chloride  10-40 mL Intracatheter Q12H    Continuous Infusions: . feeding supplement (VITAL HIGH PROTEIN) 1,000 mL (05/16/14 0654)    Arthur Holms, RD, LDN Pager #: [phone] After-Hours Pager #: [phone]

## 2014-05-16 NOTE — Progress Notes (Addendum)
Lab called with critical CO2 >45 consistent with previous CO2 results. Dr. Nelda Marseille on unit rounding on patients and made aware of Critical CO2 value.

## 2014-05-17 ENCOUNTER — Inpatient Hospital Stay (HOSPITAL_COMMUNITY): Payer: Medicaid Other

## 2014-05-17 LAB — BASIC METABOLIC PANEL
BUN: 28 mg/dL — ABNORMAL HIGH (ref 6–23)
CALCIUM: 9.5 mg/dL (ref 8.4–10.5)
Chloride: 90 mEq/L — ABNORMAL LOW (ref 96–112)
Creatinine, Ser: <0.2 mg/dL — ABNORMAL LOW (ref 0.50–1.10)
GLUCOSE: 83 mg/dL (ref 70–99)
Potassium: 4 mEq/L (ref 3.7–5.3)
SODIUM: 142 meq/L (ref 137–147)

## 2014-05-17 LAB — GLUCOSE, CAPILLARY
GLUCOSE-CAPILLARY: 102 mg/dL — AB (ref 70–99)
Glucose-Capillary: 43 mg/dL — CL (ref 70–99)
Glucose-Capillary: 84 mg/dL (ref 70–99)
Glucose-Capillary: 93 mg/dL (ref 70–99)
Glucose-Capillary: 100 mg/dL — ABNORMAL HIGH (ref 70–99)
Glucose-Capillary: 109 mg/dL — ABNORMAL HIGH (ref 70–99)

## 2014-05-17 LAB — CBC
HCT: 29.7 % — ABNORMAL LOW (ref 36.0–46.0)
HEMOGLOBIN: 8.5 g/dL — AB (ref 12.0–15.0)
MCH: 24.9 pg — AB (ref 26.0–34.0)
MCHC: 28.6 g/dL — ABNORMAL LOW (ref 30.0–36.0)
MCV: 87.1 fL (ref 78.0–100.0)
Platelets: 381 10*3/uL (ref 150–400)
RBC: 3.41 MIL/uL — AB (ref 3.87–5.11)
RDW: 21.4 % — ABNORMAL HIGH (ref 11.5–15.5)
WBC: 10.9 10*3/uL — ABNORMAL HIGH (ref 4.0–10.5)

## 2014-05-17 LAB — PHOSPHORUS: Phosphorus: 2.8 mg/dL (ref 2.3–4.6)

## 2014-05-17 LAB — MAGNESIUM: Magnesium: 1.4 mg/dL — ABNORMAL LOW (ref 1.5–2.5)

## 2014-05-17 MED ORDER — PANTOPRAZOLE SODIUM 40 MG PO PACK: 40.0000 mg | PACK | Freq: Every day | ORAL | Status: AC

## 2014-05-17 MED ORDER — FENTANYL CITRATE 0.05 MG/ML IJ SOLN: 50.0000 ug | INTRAMUSCULAR | Status: AC | PRN

## 2014-05-17 MED ORDER — VITAL HIGH PROTEIN PO LIQD: ORAL | Status: AC

## 2014-05-17 MED ORDER — PREDNISONE 5 MG/5ML PO SOLN: ORAL | Status: AC

## 2014-05-17 MED ORDER — FUROSEMIDE 20 MG PO TABS: 40.0000 mg | ORAL_TABLET | Freq: Every day | ORAL | Status: AC

## 2014-05-17 MED ORDER — HYDROCODONE-ACETAMINOPHEN 5-325 MG PO TABS: 1.0000 | ORAL_TABLET | ORAL | Status: AC | PRN

## 2014-05-17 MED ORDER — BUDESONIDE 0.5 MG/2ML IN SUSP: 0.5000 mg | Freq: Two times a day (BID) | RESPIRATORY_TRACT | Status: AC

## 2014-05-17 MED ORDER — LEVOTHYROXINE SODIUM 137 MCG PO TABS: 137.0000 ug | ORAL_TABLET | Freq: Every day | ORAL | Status: AC

## 2014-05-17 MED ORDER — ALPRAZOLAM 0.25 MG PO TABS
1.2500 mg | ORAL_TABLET | Freq: Three times a day (TID) | ORAL | Status: AC
Start: 2014-05-17 — End: ?

## 2014-05-17 MED ORDER — ACETAMINOPHEN 160 MG/5ML PO SOLN: 650.0000 mg | ORAL | Status: AC | PRN

## 2014-05-17 MED ORDER — ALUM & MAG HYDROXIDE-SIMETH 200-200-20 MG/5ML PO SUSP: 30.0000 mL | Freq: Four times a day (QID) | ORAL | Status: AC | PRN

## 2014-05-17 MED ORDER — INSULIN GLARGINE 100 UNIT/ML ~~LOC~~ SOLN: 25.0000 [IU] | Freq: Every day | SUBCUTANEOUS | Status: AC

## 2014-05-17 MED ORDER — PRO-STAT SUGAR FREE PO LIQD: 30.0000 mL | Freq: Three times a day (TID) | ORAL | Status: AC

## 2014-05-17 MED ORDER — BIOTENE DRY MOUTH MT LIQD: 15.0000 mL | Freq: Four times a day (QID) | OROMUCOSAL | Status: AC

## 2014-05-17 MED ORDER — INSULIN GLARGINE 100 UNIT/ML ~~LOC~~ SOLN: 35.0000 [IU] | Freq: Every day | SUBCUTANEOUS | Status: DC

## 2014-05-17 MED ORDER — CHLORHEXIDINE GLUCONATE 0.12 % MT SOLN: 15.0000 mL | Freq: Two times a day (BID) | OROMUCOSAL | Status: AC

## 2014-05-17 MED ORDER — BISACODYL 10 MG RE SUPP: 10.0000 mg | Freq: Every day | RECTAL | Status: AC | PRN

## 2014-05-17 MED ORDER — INSULIN ASPART 100 UNIT/ML ~~LOC~~ SOLN: SUBCUTANEOUS | Status: AC

## 2014-05-17 MED ORDER — IPRATROPIUM-ALBUTEROL 0.5-2.5 (3) MG/3ML IN SOLN: 3.0000 mL | RESPIRATORY_TRACT | Status: AC

## 2014-05-17 MED ORDER — INSULIN ASPART 100 UNIT/ML ~~LOC~~ SOLN: 3.0000 [IU] | Freq: Three times a day (TID) | SUBCUTANEOUS | Status: AC

## 2014-05-17 MED ORDER — ONDANSETRON HCL 4 MG PO TABS: 4.0000 mg | ORAL_TABLET | Freq: Four times a day (QID) | ORAL | Status: AC | PRN

## 2014-05-17 NOTE — Progress Notes (Signed)
ANTIBIOTIC CONSULT NOTE - Follow-up  Pharmacy Consult for aztreonam/ linezolid (per MD) Indication: pneumonia  Allergies  Allergen Reactions  . Amoxicillin Hives  . Bactrim [Sulfamethoxazole-Tmp Ds] Hives  . Erythromycin Hives  . Keflex [Cephalexin] Hives  . Penicillins Itching and Swelling    Sweating    Patient Measurements: Height: 5\' 2"  (157.5 cm) Weight: 208 lb 5.4 oz (94.5 kg) IBW/kg (Calculated) : 50.1 Adjusted Body Weight: n/a  Vital Signs: Temp: 98.7 F (37.1 C) (06/25 0801) Temp src: Oral (06/25 0801) BP: 105/52 mmHg (06/25 0801) Pulse Rate: 110 (06/25 0801) Intake/Output from previous day: 06/24 0701 - 06/25 0700 In: 1395 [I.V.:100; NG/GT:845; IV Piggyback:450] Out: 4742 [Urine:3575] Intake/Output from this shift: Total I/O In: 125 [I.V.:20; NG/GT:105] Out: 550 [Urine:550]  Labs:  Recent Labs  05/15/14 0500 05/16/14 0500 05/17/14 0500  WBC 10.6* 9.7 10.9*  HGB 8.6* 8.7* 8.5*  PLT 428* 410* 381  CREATININE <0.20* <0.20* <0.20*   CrCl cannot be calculated (Patient has no sCr result on file.). No results found for this basename: VANCOTROUGH, Corlis Leak, VANCORANDOM, Fleming, GENTPEAK, GENTRANDOM, TOBRATROUGH, TOBRAPEAK, TOBRARND, AMIKACINPEAK, AMIKACINTROU, AMIKACIN,  in the last 72 hours   Microbiology: Recent Results (from the past 720 hour(s))  CULTURE, BLOOD (ROUTINE X 2)     Status: None   Collection Time    04/17/14  9:35 AM      Result Value Ref Range Status   Specimen Description BLOOD LEFT ANTECUBITAL   Final   Special Requests BOTTLES DRAWN AEROBIC ONLY 1CC   Final   Culture  Setup Time     Final   Value: 04/17/2014 14:03     Performed at [facility]   Culture     Final   Value: STAPHYLOCOCCUS SPECIES (COAGULASE NEGATIVE)     Note: SUSCEPTIBILITIES PERFORMED ON PREVIOUS CULTURE WITHIN THE LAST 5 DAYS.     Note: Gram Stain Report Called to,Read Back By and Verified With: DARA ANDERSON ON 04/18/2014 AT 6:30P BY Dennard Nip   Performed at [facility]   Report Status 04/20/2014 FINAL   Final  CULTURE, BLOOD (ROUTINE X 2)     Status: None   Collection Time    04/17/14  9:40 AM      Result Value Ref Range Status   Specimen Description BLOOD LEFT HAND   Final   Special Requests BOTTLES DRAWN AEROBIC ONLY 2CC   Final   Culture  Setup Time     Final   Value: 04/17/2014 14:03     Performed at [facility]   Culture     Final   Value: STAPHYLOCOCCUS SPECIES (COAGULASE NEGATIVE)     Note: RIFAMPIN AND GENTAMICIN SHOULD NOT BE USED AS SINGLE DRUGS FOR TREATMENT OF STAPH INFECTIONS.     Note: Gram Stain Report Called to,Read Back By and Verified With: DARA ANDERSON ON 04/18/2014 AT 6:30P BY Dennard Nip     Performed at [facility]   Report Status 04/20/2014 FINAL   Final   Organism ID, Bacteria STAPHYLOCOCCUS SPECIES (COAGULASE NEGATIVE)   Final  CULTURE, RESPIRATORY (NON-EXPECTORATED)     Status: None   Collection Time    04/17/14 11:55 AM      Result Value Ref Range Status   Specimen Description TRACHEAL ASPIRATE   Final   Special Requests Normal   Final   Gram Stain     Final   Value: ABUNDANT WBC PRESENT, PREDOMINANTLY PMN     RARE SQUAMOUS EPITHELIAL CELLS PRESENT  NO ORGANISMS SEEN     Performed at [facility]   Culture     Final   Value: ABUNDANT STENOTROPHOMONAS MALTOPHILIA     Performed at [facility]   Report Status 04/21/2014 FINAL   Final   Organism ID, Bacteria STENOTROPHOMONAS MALTOPHILIA   Final  CULTURE, BLOOD (ROUTINE X 2)     Status: None   Collection Time    04/21/14  3:20 AM      Result Value Ref Range Status   Specimen Description BLOOD LEFT ARM   Final   Special Requests BOTTLES DRAWN AEROBIC ONLY 10CC   Final   Culture  Setup Time     Final   Value: 04/21/2014 11:23     Performed at [facility]   Culture     Final   Value: NO GROWTH 5 DAYS     Performed at [facility]   Report Status 04/27/2014 FINAL   Final  CULTURE,  BLOOD (ROUTINE X 2)     Status: None   Collection Time    04/21/14  3:25 AM      Result Value Ref Range Status   Specimen Description BLOOD LEFT ARM   Final   Special Requests BOTTLES DRAWN AEROBIC ONLY 10CC   Final   Culture  Setup Time     Final   Value: 04/21/2014 11:23     Performed at [facility]   Culture     Final   Value: NO GROWTH 5 DAYS     Performed at [facility]   Report Status 04/27/2014 FINAL   Final  MRSA PCR SCREENING     Status: Abnormal   Collection Time    05/07/14  4:05 PM      Result Value Ref Range Status   MRSA by PCR POSITIVE (*) NEGATIVE Final   Comment:            The GeneXpert MRSA Assay (FDA     approved for NASAL specimens     only), is one component of a     comprehensive MRSA colonization     surveillance program. It is not     intended to diagnose MRSA     infection nor to guide or     monitor treatment for     MRSA infections.     RESULT CALLED TO, READ BACK BY AND VERIFIED WITH:     S.SHAW,RN AT 1908 BY L.PITT 05/07/14  CLOSTRIDIUM DIFFICILE BY PCR     Status: None   Collection Time    05/07/14 10:02 PM      Result Value Ref Range Status   C difficile by pcr NEGATIVE  NEGATIVE Final   Assessment: 58 yo female with paraplegia with Coag-negative S. Bacteremia and MDR Stenotroph - on vancomycin for 2 weeks, stopped 6/13. Pt is paraplegic so SCr likely falsely low but good UOP. She continues on aztreonam + linezolid for HCAP. Doses are appropriate. Today is D#7 of treatment.   Vanc 5/17 >> 6/13 Primaxin 5/28 >> 6/1 Linezolid 6/19 > Aztreonam 6/19 >  5/22 VT = 11.1> increase to 1g/12 5/24 VT 15.1 >> continue 5/31 VT 33.8 on 1 g IV q12h> changed to 1 g IV q24h 6/4 VT 12.5 need goal close to 20 d/t Vanc MIC of 2 > change to 1g q18h 6/7 VT 16.8 drawn 1 hour early. True trough likely lower > 750 mg q 12h  6/10 VT 22.5 drawn  1 hour early > cont 750/12h  5/16 BCx- GPC 2/2 (coag negative staph) MIC to Vanc = 2 5/25 Trach  asp-Steno 5/26 BCx-CoNS (MIC to Vanc = 1) 5/26 Trach asp-Stenotrophomonas (R- LQ, Bactrim) 5/30 BCx-neg 6/15 MRSA: Pos 6/15 Cdiff - NEG   Goal of Therapy:  Resolution of infection  Plan:  1. Continue aztreonam 1gm IV Q8H  2. Continue linezolid per MD 3. F/u renal fxn, C&S, clinical status and planned LOT  Salome Arnt, PharmD, BCPS Pager # [phone] 05/17/2014 9:22 AM

## 2014-05-17 NOTE — Discharge Summary (Signed)
Physician Discharge Summary  Patient ID: Joanna Perez MRN: [id number] DOB/AGE: 1956/07/02 58 y.o.  Admit date: 04/07/2014 Discharge date: 05/17/2014    Discharge Diagnoses:  Principal Problem:   Acute paraplegia Active Problems:   HYPOTHYROIDISM   Type 2 diabetes mellitus   BACK PAIN, CHRONIC   Chronic respiratory failure   Chronic diastolic heart failure   Hypotension   Obesity   ARF (acute renal failure)   Dehydration   Acute encephalopathy   Bacteremia   Normocytic anemia   Thoracic spinal stenosis   Spinal stenosis, thoracic   Shock    Brief Summary: Joanna Perez is a 58 y.o. y/o female with a PMH of COPD, CHF, DM, CAD s/p MI, atrial fibrillation transferred from [facility] for BLE paraplegia, found to have T6 fracture with spinal stenosis and spinal cord compression on MRI. Transferred to [facility] 5/17.  She was taken for decompressive surgery 5/18 and remained on mechanical ventilation post surgery but remained essentially unchanged from neuro standpoint.  She had a trial of extubation 5/18 but became increasingly somnolent/hypercarbic and required reintubation.  She continued to fail efforts at vent weaning and tracheostomy placed 5/26. Improved and tx to SDU for vent weaning. On 6/15 developed hypotension requiring pressors and tx back to ICU.  She was treated for presumed new HCAP, treated with linezolid, aztreonam as below.   She remains on full vent support with poor weaning efforts, likely complicated by anxiety.  Now medically stable for tx to vent SNF for further weaning efforts and prolonged vent support.   SIGNIFICANT EVENTS / STUDIES:  5/16 Admitted to [facility], hypotensive and with new paraplegia  5/17 Transfer to [facility]  5/17 MR Cervical/Thoracic/Lumbar Spine - worsened T6 compression fracture with paraspinal and epidural hematoma causing cord compression and edema, possible early cord infarct  5/18 Intubation for procedure  5/18 Thoracic  laminectomy  5/18 Extubated  5/19 Somnolent / hypercarbic >>> reintubated 5/26 Tracheostomy  5/28 TTE >>> Ef 65%, vegetation is not reported  6/04 G-tube placement  6/10 Transfused PRBC  6/15 Transferred to ICU for vasopressors  6/17 BP improved with fluids / stress dose steroids, vasopressors off  6/19 Respiratory distress / hypoxemia / increased airspace disease  6/23 Remains on full support, hypoglycemic episode overnight   LINES / TUBES:  ETT 5/18 >>> 5/18; 5/19 >>>5/26  R R AL 5/17 >>> out  Foley 5/16 >>>  R PICC 5/22 >>> 5/28  Trach 5/26 (DF) >>>  PEG 6/4 >>>   CULTURES:  5/16 Blood >>> COAG NEG STAPH  5/16 Urine >>> neg  5/25 Sputum > STENOTROPHOMONAS MALTOPHILIA  5/26 Sputum >>> STENOTROPHOMONAS MALTOPHILIA  5/26 Blood >>> COAG NEG STAPH  5/30 Blood >>> neg   ANTIBIOTICS per ID:  Vancomycin 5/17 >>>6/13  Levaquin 5/17 >>> 5/19  Imipenem 5/28 >>> 6/1  Zyvox 6/19 >>> 6/25 Aztreonam 6/19 >>>6/25                                                                    D/c plan by Discharge Diagnosis  Acute on chronic respiratory failure  Respiratory muscle weakness secondary to cord compression / new T6 paraplegia  AECOPD  Suspect OSA / OHS  Tracheostomy status  ?HCAP  P:  - continue attempts at weaning  - Cont BD  - Continue PO prednisone with taper down to 37m/day maintenance   CARDIOVASCULAR  A:  Chronic diastolic heart failure  P:  - Continue lasix 40 mg daily   GASTROINTESTINAL  A:  Nutrition  GERD  Diarrhea - CDiff neg  S/p PEG 6/4  P:  - TF via PEG - protonix   INFECTIOUS  A:  Bacteremia with COAG NEG STAPH  STENOTROPHOMONAS MALTOPHILIA, 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  - Reduce Lantus to 25u daily &  meal coverage to 3 units - Taper steroids, baseline 10 mg QD.  - continue Synthroid.  - TSH check 7/2.   NEUROLOGIC  A:  T6 cord compression / paraplegia, s/p spinal cord decompression, still no LE movement  Delirium, resolved  Anxiety  P:  - Vicodin / Fentanyl / Ativan PRN.  - Cont hold celexa (on hold r/t risk for serotonin syndrome with Zyvox ), consider resume at SNF  - Xanax  - PT/OT.    Filed Vitals:   05/17/14 0801 05/17/14 1134 05/17/14 1204 05/17/14 1347  BP: 105/52  82/48   Pulse: 110 108 106   Temp: 98.7 F (37.1 C)     TempSrc: Oral     Resp: _0 Height:      Weight:      SpO2: 96%  99% 97%     Discharge Labs  BMET  Recent Labs Lab 05/11/14 0500 05/12/14 0500 05/13/14 0730 05/14/14 0715 05/15/14 0500 05/16/14 0500 05/17/14 0500  NA 145 144 147 146 144 144 142  K 4.5 3.6* 4.1 3.9 4.0 4.2 4.0  CL 102 95* 97 93* 89* 90* 90*  CO2 37* 39* 45* >45* >45* >45* >45*  GLUCOSE 223* 311* 171* 75 70 83 83  BUN 31* 32* 31* 29* 25* 24* 28*  CREATININE 0.27* 0.25* 0.22* 0.20* <0.20* <0.20* <0.20*  CALCIUM 9.9 10.2 9.8 9.8 9.7 9.3 9.5  MG  --  1.6  --   --  1.5  --  1.4*  PHOS  --  2.5  --   --  2.9  --  2.8     CBC   Recent Labs Lab 05/15/14 0500 05/16/14 0500 05/17/14 0500  HGB 8.6* 8.7* 8.5*  HCT 29.8* 30.3* 29.7*  WBC 10.6* 9.7 10.9*  PLT 428* 410* 381   Anti-Coagulation No results found for this basename: INR,  in the last 168 hours           Follow-up Information   Follow up with FGlo Herring, MD. (after d/c from vent SNF)    Specialty:  Internal Medicine   Contact information:   [phone]         Medication List    STOP taking these medications       apixaban 5 MG Tabs tablet  Commonly known as:  ELIQUIS     bisacodyl 5 MG EC tablet  Commonly known as:  DULCOLAX  Replaced by:  bisacodyl 10 MG suppository     budesonide 0.25 MG/2ML nebulizer solution   Commonly known as:  PULMICORT  Replaced by:  budesonide 0.5 MG/2ML nebulizer  solution     citalopram 20 MG tablet  Commonly known as:  CELEXA     diltiazem 240 MG 24 hr capsule  Commonly known as:  CARDIZEM CD     fluticasone 50 MCG/ACT nasal spray  Commonly known as:  FLONASE     guaiFENesin 600 MG 12 hr tablet  Commonly known as:  MUCINEX     HYDROcodone-acetaminophen 10-325 MG per tablet  Commonly known as:  NORCO  Replaced by:  HYDROcodone-acetaminophen 5-325 MG per tablet     levofloxacin 500 MG tablet  Commonly known as:  LEVAQUIN     metFORMIN 500 MG tablet  Commonly known as:  GLUCOPHAGE     NEURONTIN 100 MG capsule  Generic drug:  gabapentin     nystatin 100000 UNIT/ML suspension  Commonly known as:  MYCOSTATIN     pantoprazole 40 MG tablet  Commonly known as:  PROTONIX  Replaced by:  pantoprazole sodium 40 mg/20 mL Pack     predniSONE 10 MG tablet  Commonly known as:  DELTASONE  Replaced by:  predniSONE 5 MG/5ML solution     ramipril 5 MG capsule  Commonly known as:  ALTACE     simvastatin 40 MG tablet  Commonly known as:  ZOCOR     theophylline 200 MG 12 hr tablet  Commonly known as:  THEODUR     tiotropium 18 MCG inhalation capsule  Commonly known as:  SPIRIVA      TAKE these medications       acetaminophen 160 MG/5ML solution  Commonly known as:  TYLENOL  Place 20.3 mLs (650 mg total) into feeding tube every 4 (four) hours as needed for mild pain or fever.     albuterol (2.5 MG/3ML) 0.083% nebulizer solution  Commonly known as:  PROVENTIL  Take 3 mLs (2.5 mg total) by nebulization every 6 (six) hours as needed for wheezing or shortness of breath.     ALPRAZolam 0.25 MG tablet  Commonly known as:  XANAX  Take 5 tablets (1.25 mg total) by mouth 3 (three) times daily.     alum & mag hydroxide-simeth 200-200-20 MG/5ML suspension  Commonly known as:  MAALOX/MYLANTA  Take 30 mLs by mouth every 6 (six) hours as needed for indigestion.      antiseptic oral rinse Liqd  15 mLs by Mouth Rinse route QID.     bisacodyl 10 MG suppository  Commonly known as:  DULCOLAX  Place 1 suppository (10 mg total) rectally daily as needed for moderate constipation.     budesonide 0.5 MG/2ML nebulizer solution  Commonly known as:  PULMICORT  Take 2 mLs (0.5 mg total) by nebulization 2 (two) times daily.     chlorhexidine 0.12 % solution  Commonly known as:  PERIDEX  15 mLs by Mouth Rinse route 2 (two) times daily.     feeding supplement (PRO-STAT SUGAR FREE 64) Liqd  Place 30 mLs into feeding tube 3 (three) times daily.     feeding supplement (VITAL HIGH PROTEIN) Liqd liquid  81m/hr     fentaNYL 0.05 MG/ML injection  Commonly known as:  SUBLIMAZE  Inject 1-2 mLs (50-100 mcg total) into the vein every 2 (two) hours as needed for severe pain (or RASS > 0).     furosemide 20 MG tablet  Commonly known as:  LASIX  Place 2 tablets (40 mg total) into feeding tube daily.     HYDROcodone-acetaminophen 5-325 MG per tablet  Commonly known as:  NORCO/VICODIN  Take 1  tablet by mouth every 4 (four) hours as needed for moderate pain.     insulin aspart 100 UNIT/ML injection  Commonly known as:  novoLOG  Inject 3 Units into the skin 3 (three) times daily with meals.     insulin aspart 100 UNIT/ML injection  Commonly known as:  novoLOG  - CBG 70 - 120: 0 units  - CBG 121 - 150: 3 units  - CBG 151 - 200: 4 units  - CBG 201 - 250: 7 units  - CBG 251 - 300: 11 units  - CBG 301 - 350: 15 units  - CBG 351 - 400: 20 units  - CBG > 400: call MD and obtain STAT lab verification     insulin glargine 100 UNIT/ML injection  Commonly known as:  LANTUS  Inject 0.25 mLs (25 Units total) into the skin at bedtime.     ipratropium-albuterol 0.5-2.5 (3) MG/3ML Soln  Commonly known as:  DUONEB  Take 3 mLs by nebulization every 4 (four) hours.     levothyroxine 137 MCG tablet  Commonly known as:  SYNTHROID, LEVOTHROID  Take 1 tablet (137 mcg  total) by mouth daily before breakfast.     ondansetron 4 MG tablet  Commonly known as:  ZOFRAN  Take 1 tablet (4 mg total) by mouth every 6 (six) hours as needed for nausea.     pantoprazole sodium 40 mg/20 mL Pack  Commonly known as:  PROTONIX  Place 20 mLs (40 mg total) into feeding tube daily.     predniSONE 5 MG/5ML solution  14m per tube daily x 4 days then 121mper tube daily maintenance dose     sodium chloride 0.65 % Soln nasal spray  Commonly known as:  OCEAN  Place 1 spray into both nostrils as needed for congestion (also available OTC).     vitamin D (CHOLECALCIFEROL) 400 UNITS tablet  Take 1 tablet (400 Units total) by mouth daily.          Disposition:  Vent SNF   Discharged Condition: Joanna KURIAKOSEas met maximum benefit of inpatient care and is medically stable and cleared for discharge.  Patient is pending follow up as above.      Time spent on disposition:  Greater than 35 minutes.   Signed: Darlina SicilianNP 05/17/2014  2:12 PM Pager: [phone] or [phone]*Care during the described time interval was provided by me and/or other providers on the critical care team. I have reviewed this patient's available data, including medical history, events of note, physical examination and test results as part of my evaluation.

## 2014-05-17 NOTE — Progress Notes (Signed)
CSW supervisor sending LOG to [facility] in [place], [place]. Pt discharging tomorrow morning via [employer] (pick-up at 8:30am). Discharge summary sent to facility. Accepting MD to facility: Dr. Bailey Mech. Pt is going to room 304. Discharge packet placed on pt's chart. CSW will facilitate discharge tomorrow morning.   Ky Barban, MSW, [facility] Clinical Social Worker [phone]

## 2014-05-17 NOTE — Trach Care Team (Signed)
Trach Care Progression Note   Patient Details Name: Joanna Perez MRN: [id number] DOB: [birth year] Today's Date: 05/17/2014   Tracheostomy Assessment    Tracheostomy Shiley 6 mm Cuffed (Active)  Status Secured 05/17/2014  1:47 PM  Site Assessment Clean;Dry 05/17/2014  1:47 PM  Site Care Cleansed;Dried;Dressing applied 05/17/2014  6:40 AM  Inner Cannula Care Changed/new 05/17/2014  6:40 AM  Ties Assessment Clean;Dry;Secure 05/17/2014  1:47 PM  Cuff pressure (cm) 25 cm 05/16/2014 11:32 PM  Emergency Equipment at bedside Yes 05/17/2014  1:47 PM     Care Needs Suture removal post-op day #7 :  (no sutures)   Respiratory Therapy O2 Device: Ventilator FiO2 (%): 40 % SpO2: 97 % Who was educated?: Not applicable Respiratory barriers to progression:  (vent weaning)    Speech Language Pathology  Not appropriate for SLP - update per B.DeBlois, SLP     Physical Therapy PT Recommendation/Assessment: Patient needs continued PT services Follow Up Recommendations: SNF;Supervision/Assistance - 24 hour PT equipment: None recommended by PT    Occupational Therapy OT Recommendation/Assessment: Patient needs continued OT Services Follow Up Recommendations: SNF;Supervision/Assistance - 24 hour OT Equipment: Wheelchair (measurements OT);Wheelchair cushion (measurements OT);Hospital bed (mattress overlay)    Nutritional Patient's Current Diet: Tube feeding Tube Feeding: Other (Comment) (Vital HP) Tube Feeding Frequency: Continuous Tube Feeding Strength: Full strength    Case Management/Social Work Level of patient care prior to hospitalization: Home-Self care Living status: Spouse Insurance payer: Medicaid Barriers to progression:  Plan to address barriers: CSW working with [place] DSS/vent SNF in [place] Anticipated discharge disposition: SNF/Assisted  living facility (vent SNF) on 05/18/14 & transportation arranged/ bed ready - [facility] in [facility]    Nahla Lukin     [facility] Team  Members -  [place], SLP, Deveron Furlong, [place], [place], [place], [place], [place] and Maylon Peppers, RD         [place], Jaci Carrel (scribe for team) 05/17/2014, 1:51 PM

## 2014-05-17 NOTE — Progress Notes (Signed)
Critical value CO2 >45.  Consistent with previous CO2 levels.  MD paged and made aware.

## 2014-05-17 NOTE — Progress Notes (Signed)
[facility], located at [address] in [place], [place], is the closest ventilator skilled nursing facility able to offer this patient a bed for skilled nursing care. [facility] in [place], [place] has a vent SNF but they do not take Medicaid-only patients. Pt is on the waiting list for a bed at [facility] and [facility] in [place], [place] and [facility] in [place], [place], but both facilities state they are full and pt is on their waiting list but they both do not anticipate a discharge for weeks. These facilities ([facility], [facility]), are the only ventilator skilled nursing facilities in [place]. Consequently, the only option for placement is the [facility] in [place], [place].   Ky Barban, MSW, [facility] Clinical Social Worker [phone]

## 2014-05-17 NOTE — Progress Notes (Signed)
PULMONARY / CRITICAL CARE MEDICINE  Name: Joanna Perez MRN: [id number] DOB: 06/14/1956    ADMISSION DATE:  04/07/2014 CONSULTATION DATE:  04/09/2014  REFERRING MD :  Dr. Annette Stable PRIMARY SERVICE:  PCCM  CHIEF COMPLAINT:  Paraplegia   BRIEF PATIENT DESCRIPTION: 58 yo with COPD, CHF, DM, CAD s/p MI, atrial fibrillation transferred from [facility] for BLE paraplegia, found to have T6 fracture with spinal stenosis and spinal cord compression on MRI. She was taken for decompressive surgery and remained on mechanical ventilation post surgery. Tracheostomy placed 5/26.  Improved and tx to SDU for vent weaning.  On 6/15 developed hypotension requiring pressors and tx back to ICU.  Prolonged hospitalization with prolonged vent needs.    SIGNIFICANT EVENTS / STUDIES:  5/16  Admitted to [facility], hypotensive and with new paraplegia 5/17  Transfer to [facility] 5/17  MR Cervical/Thoracic/Lumbar Spine - worsened T6 compression fracture with paraspinal and epidural hematoma causing cord compression and edema, possible early cord infarct 5/18  Intubation for procedure 5/18  Thoracic laminectomy 5/18  Extubated 5/19  Somnolent / hypercarbic >>> reintubated 5/26  Tracheostomy 5/28  TTE >>> Ef 65%, vegetation is not reported 6/04  G-tube placement 6/10  Transfused PRBC 6/15  Transferred to ICU for vasopressors 6/17  BP improved with fluids / stress dose steroids, vasopressors off 6/19  Respiratory distress / hypoxemia / increased airspace disease 6/23  Remains on full support, hypoglycemic episode overnight   LINES / TUBES: ETT 5/18 >>> 5/18; 5/19 >>>5/26 R R AL 5/17 >>> out Foley 5/16 >>> R PICC 5/22 >>> 5/28 Trach 5/26 (DF) >>> PEG 6/4 >>>  CULTURES: 5/16  Blood >>>  COAG NEG STAPH 5/16  Urine >>> neg 5/25 Sputum >  STENOTROPHOMONAS MALTOPHILIA 5/26  Sputum >>> STENOTROPHOMONAS MALTOPHILIA 5/26  Blood >>> COAG NEG STAPH 5/30  Blood  >>> neg  ANTIBIOTICS per ID: Vancomycin 5/17  >>>6/13 Levaquin 5/17 >>> 5/19 Imipenem 5/28 >>> 6/1 Zyvox 6/19 >>> Aztreonam 6/19 >>>  INTERVAL HISTORY:  No events overnight, remains on vent, not weaning.  VITAL SIGNS: Temp:  [98.2 F (36.8 C)-99.6 F (37.6 C)] 98.7 F (37.1 C) (06/25 0801) Pulse Rate:  [64-113] 110 (06/25 0801) Resp:  [19-24] 22 (06/25 0801) BP: (102-132)/(52-60) 105/52 mmHg (06/25 0801) SpO2:  [95 %-99 %] 96 % (06/25 0801) FiO2 (%):  [40 %] 40 % (06/25 0801) Weight:  [208 lb 5.4 oz (94.5 kg)] 208 lb 5.4 oz (94.5 kg) (06/25 0332)  HEMODYNAMICS:    VENTILATOR SETTINGS: Vent Mode:  [-] PRVC FiO2 (%):  [40 %] 40 % Set Rate:  [20 bmp] 20 bmp Vt Set:  [400 mL] 400 mL PEEP:  [5 cmH20] 5 cmH20 Plateau Pressure:  [22 cmH20-28 cmH20] 22 cmH20  INTAKE / OUTPUT: Intake/Output     06/24 0701 - 06/25 0700 06/25 0701 - 06/26 0700   I.V. (mL/kg) 100 (1.1) 50 (0.5)   NG/GT 845 105   IV Piggyback 450    Total Intake(mL/kg) 1395 (14.8) 155 (1.6)   Urine (mL/kg/hr) 3575 (1.6) 550 (1.7)   Total Output 3575 550   Net -2180 -395         PHYSICAL EXAMINATION: General:  Chronically ill appearing female, NAD  Neuro:  Awake, alert, poor movement of extremities HEENT:  Tracheostomy site intact, minimal secretions, nose without purulence Cardiovascular:  RRR, no murmurs Lungs: resp's even/non-labored, lungs bilaterally with faint crackles lower,  no active wheezing. Abdomen:  Obese, soft, bowel sounds present, nontender.  Musculoskeletal:  No edema, PAS in place.  LABS: CBC  Recent Labs Lab 05/15/14 0500 05/16/14 0500 05/17/14 0500  WBC 10.6* 9.7 10.9*  HGB 8.6* 8.7* 8.5*  HCT 29.8* 30.3* 29.7*  PLT 428* 410* 381   BMET  Recent Labs Lab 05/15/14 0500 05/16/14 0500 05/17/14 0500  NA 144 144 142  K 4.0 4.2 4.0  CL 89* 90* 90*  CO2 >45* >45* >45*  BUN 25* 24* 28*  CREATININE <0.20* <0.20* <0.20*  GLUCOSE 70 83 83     ASSESSMENT / PLAN:  PULMONARY A: Acute on chronic respiratory  failure Respiratory muscle weakness secondary to cord compression / new T6 paraplegia  AECOPD Suspect OSA / OHS Tracheostomy status ?HCAP P:    - Not wean, awaiting vent SNF placement pending insurance approval - Cont BD - Continue PO prednisone, reduce to 20mg  6/23  CARDIOVASCULAR A:  Chronic diastolic heart failure P:  - Continue lasix 40 mg IV daily  GASTROINTESTINAL A:   Nutrition GERD Diarrhea - CDiff neg  S/p PEG 6/4 P:   - TF - No free water  INFECTIOUS A:   Bacteremia with COAG NEG STAPH STENOTROPHOMONAS MALTOPHILIA, 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 - No culture data available.   ENDOCRINE  A:   Hyperglycemia Hypothyroidism Suspect adrenal insufficiency -- on chronic low dose steroids P:   - SSI. - Reduce Lantus to 35u 6/23 & meal coverage to 3 units> hypoglycemia episodes 6/23. - Taper steroids, baseline 10 mg QD. - Synthroid. - TSH check 7/2.  NEUROLOGIC A:   T6 cord compression / paraplegia, s/p spinal cord decompression, still no LE movement Delirium, resolved Anxiety P:   - Vicodin / Fentanyl / Ativan PRN. - Cont hold celexa ( at risk for serotonin syndrome with Zyvox ). - Xanax as preadmission - increase 6/20 with frequent need PRN ativan. - PT/OT.  Is approved for vent SNF at [facility] in [place] once [facility] is approved.     Rush Farmer, M.D. [facility] Pulmonary/Critical Care Medicine. Pager: [phone]. After hours pager: [phone].

## 2014-05-17 NOTE — Discharge Summary (Signed)
Patient seen and examined, agree with above note.  I dictated the care and orders written for this patient under my direction.  Wesam G Yacoub, MD [phone] 

## 2014-05-17 NOTE — Progress Notes (Signed)
CRITICAL VALUE ALERT  Critical value received:  CO2 > 45  Date of notification:  05/17/14  Time of notification:  0752  Critical value read back:yes  Nurse who received alert:  Emilia Beck, RN  MD notified (1st page):  Nelda Marseille  Time of first page:  0754  MD notified (2nd page):  Time of second page:  Responding MD:  Nelda Marseille  Time MD responded:  0800

## 2014-05-17 NOTE — Progress Notes (Addendum)
CSW left voicemail for pt's case worker at [facility], as discharge is waiting on SSI Medicaid to be closed so [place] Medicaid can pay for vent SNF stay. Facility has left voicemails with DSS worker as well, as verbal confirmation is all that is needed for facility in [place] to accept her. If CSW does not hear from case worker by 2pm, CSW will attempt to contact DSS supervisor.  Addendum: Hospital will do letter-of-guarantee for payment for pt to go to [facility] today. CSW requested discharge summary ASAP, as [facility] needs discharge time earlier rather than later to have a truck ready for her. CSW following for discharge summary. CSW supervisor sending LOG to [facility].  Ky Barban, MSW, [facility] Clinical Social Worker [phone]

## 2014-05-18 ENCOUNTER — Inpatient Hospital Stay (HOSPITAL_COMMUNITY): Payer: Medicaid Other

## 2014-05-18 ENCOUNTER — Encounter: Payer: Self-pay | Admitting: *Deleted

## 2014-05-18 DIAGNOSIS — Z930 Tracheostomy status: Secondary | ICD-10-CM

## 2014-05-18 DIAGNOSIS — J9620 Acute and chronic respiratory failure, unspecified whether with hypoxia or hypercapnia: Secondary | ICD-10-CM

## 2014-05-18 DIAGNOSIS — J438 Other emphysema: Secondary | ICD-10-CM

## 2014-05-18 LAB — BASIC METABOLIC PANEL
BUN: 28 mg/dL — ABNORMAL HIGH (ref 6–23)
CHLORIDE: 93 meq/L — AB (ref 96–112)
CO2: >45 mEq/L (ref 19–32)
Calcium: 9.4 mg/dL (ref 8.4–10.5)
Creatinine, Ser: <0.2 mg/dL — ABNORMAL LOW (ref 0.50–1.10)
GLUCOSE: 89 mg/dL (ref 70–99)
POTASSIUM: 4.4 meq/L (ref 3.7–5.3)
Sodium: 146 mEq/L (ref 137–147)

## 2014-05-18 LAB — CBC
HCT: 30.1 % — ABNORMAL LOW (ref 36.0–46.0)
HEMOGLOBIN: 8.6 g/dL — AB (ref 12.0–15.0)
MCH: 26.1 pg (ref 26.0–34.0)
MCHC: 28.6 g/dL — ABNORMAL LOW (ref 30.0–36.0)
MCV: 91.5 fL (ref 78.0–100.0)
Platelets: 371 10*3/uL (ref 150–400)
RBC: 3.29 MIL/uL — AB (ref 3.87–5.11)
RDW: 21.2 % — ABNORMAL HIGH (ref 11.5–15.5)
WBC: 8.4 10*3/uL (ref 4.0–10.5)

## 2014-05-18 LAB — MAGNESIUM: MAGNESIUM: 1.7 mg/dL (ref 1.5–2.5)

## 2014-05-18 LAB — GLUCOSE, CAPILLARY
GLUCOSE-CAPILLARY: 76 mg/dL (ref 70–99)
Glucose-Capillary: 78 mg/dL (ref 70–99)
Glucose-Capillary: 103 mg/dL — ABNORMAL HIGH (ref 70–99)

## 2014-05-18 LAB — PHOSPHORUS: Phosphorus: 3.5 mg/dL (ref 2.3–4.6)

## 2014-05-18 NOTE — Progress Notes (Signed)
[facility] arrived to pick up patient.  Patient was given IV Fentanyl and Ativan to make patient more comfortable for the trip.  Patient is stable and able to transfer at this time.

## 2014-05-18 NOTE — Progress Notes (Signed)
PT Cancellation Note  Patient Details Name: Joanna Perez MRN: [id number] DOB: 1956-02-02   Cancelled Treatment:    Reason Eval/Treat Not Completed: Pt noted to have D/C plans to [facility] vent SNF at 8:30AM. Will hold treatment but continue to follow until d/c.    Jolyn Lent 05/18/2014, 8:49 AM  Jolyn Lent, PT, DPT Acute Rehabilitation Services Pager: [phone]

## 2014-05-18 NOTE — Progress Notes (Signed)
OT NOTE  Pt noted to have plans to d/c to [facility] vent SNF at 8:30 AM. Ot to hold treatment at this time pending discharge. If patient remains at [facility], will provide treatment in the afternoon.   Jeri Modena   OTR/L Pager: [phone] Office: [phone] .

## 2014-05-21 MED FILL — Ondansetron HCl Inj 4 MG/2ML (2 MG/ML): INTRAMUSCULAR | Qty: 2 | Status: AC

## 2014-05-21 MED FILL — Fentanyl Citrate Inj 0.05 MG/ML: INTRAMUSCULAR | Qty: 2 | Status: AC

## 2014-06-12 ENCOUNTER — Encounter: Payer: Self-pay | Admitting: *Deleted

## 2014-07-23 ENCOUNTER — Encounter: Payer: Self-pay | Admitting: *Deleted

## 2014-08-15 ENCOUNTER — Encounter: Payer: Self-pay | Admitting: *Deleted

## 2014-09-24 ENCOUNTER — Encounter: Payer: Self-pay | Admitting: Cardiology

## 2014-10-23 DEATH — deceased

## 2014-11-21 ENCOUNTER — Encounter: Payer: Self-pay | Admitting: *Deleted

## 2014-12-04 ENCOUNTER — Telehealth: Payer: Self-pay

## 2014-12-04 NOTE — Telephone Encounter (Signed)
Patient died @ [facility] per Joanna Perez

## 2015-02-21 IMAGING — CR DG CHEST 1V PORT
1 series · 1 of 1 positions shown · non-contrast
Comparison: 04/15/2014

CLINICAL DATA: Tracheostomy placement

EXAM:
PORTABLE CHEST - 1 VIEW

[AP]
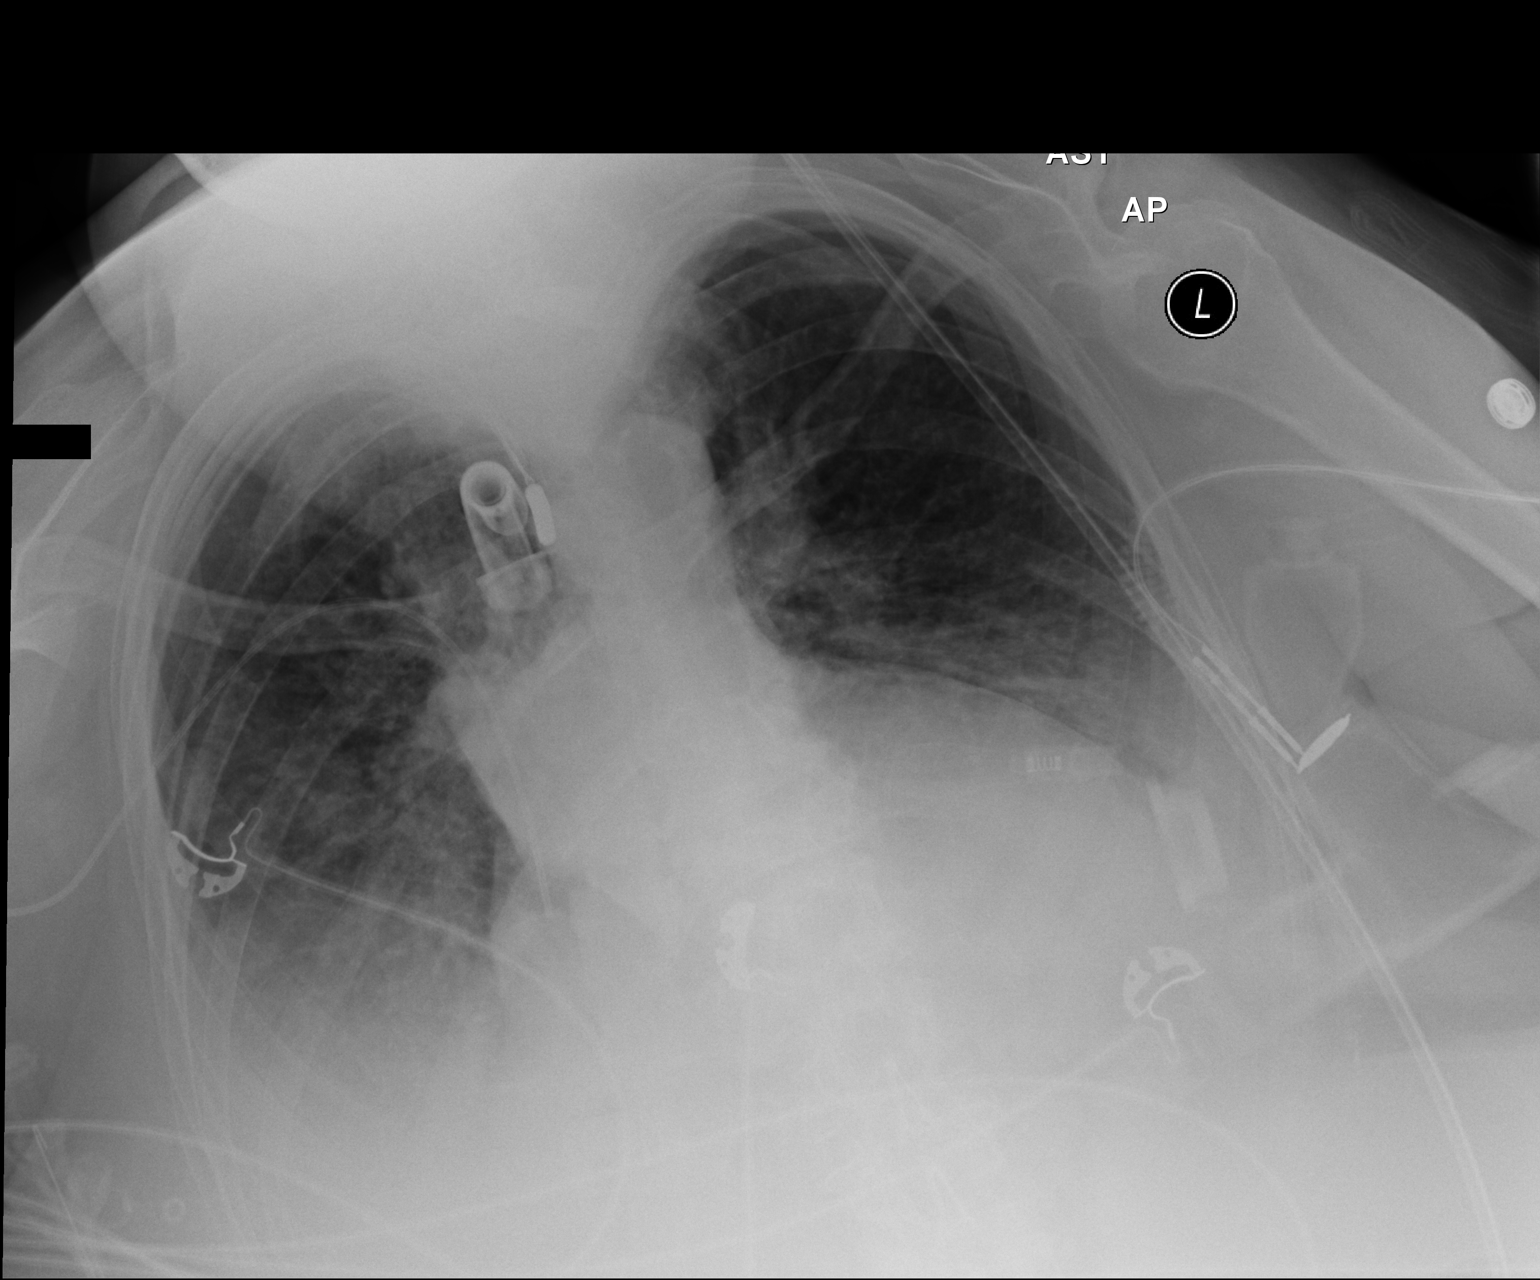

[1 of 1 positions shown; findings below may reference images not displayed]

FINDINGS: Tracheostomy has been placed in satisfactory position. No
pneumothorax.

Hypoventilation with bibasilar atelectasis and bilateral effusions.
Right arm PICC tip in the SVC.
IMPRESSION: Satisfactory tracheostomy placement.

Bibasilar atelectasis and bilateral pleural effusions.

## 2015-02-22 IMAGING — CR DG CHEST 1V PORT
1 series · 1 of 1 positions shown · non-contrast
Comparison: 04/17/2014

CLINICAL DATA: Evaluate infiltrates and tracheostomy

EXAM:
PORTABLE CHEST - 1 VIEW

[AP]
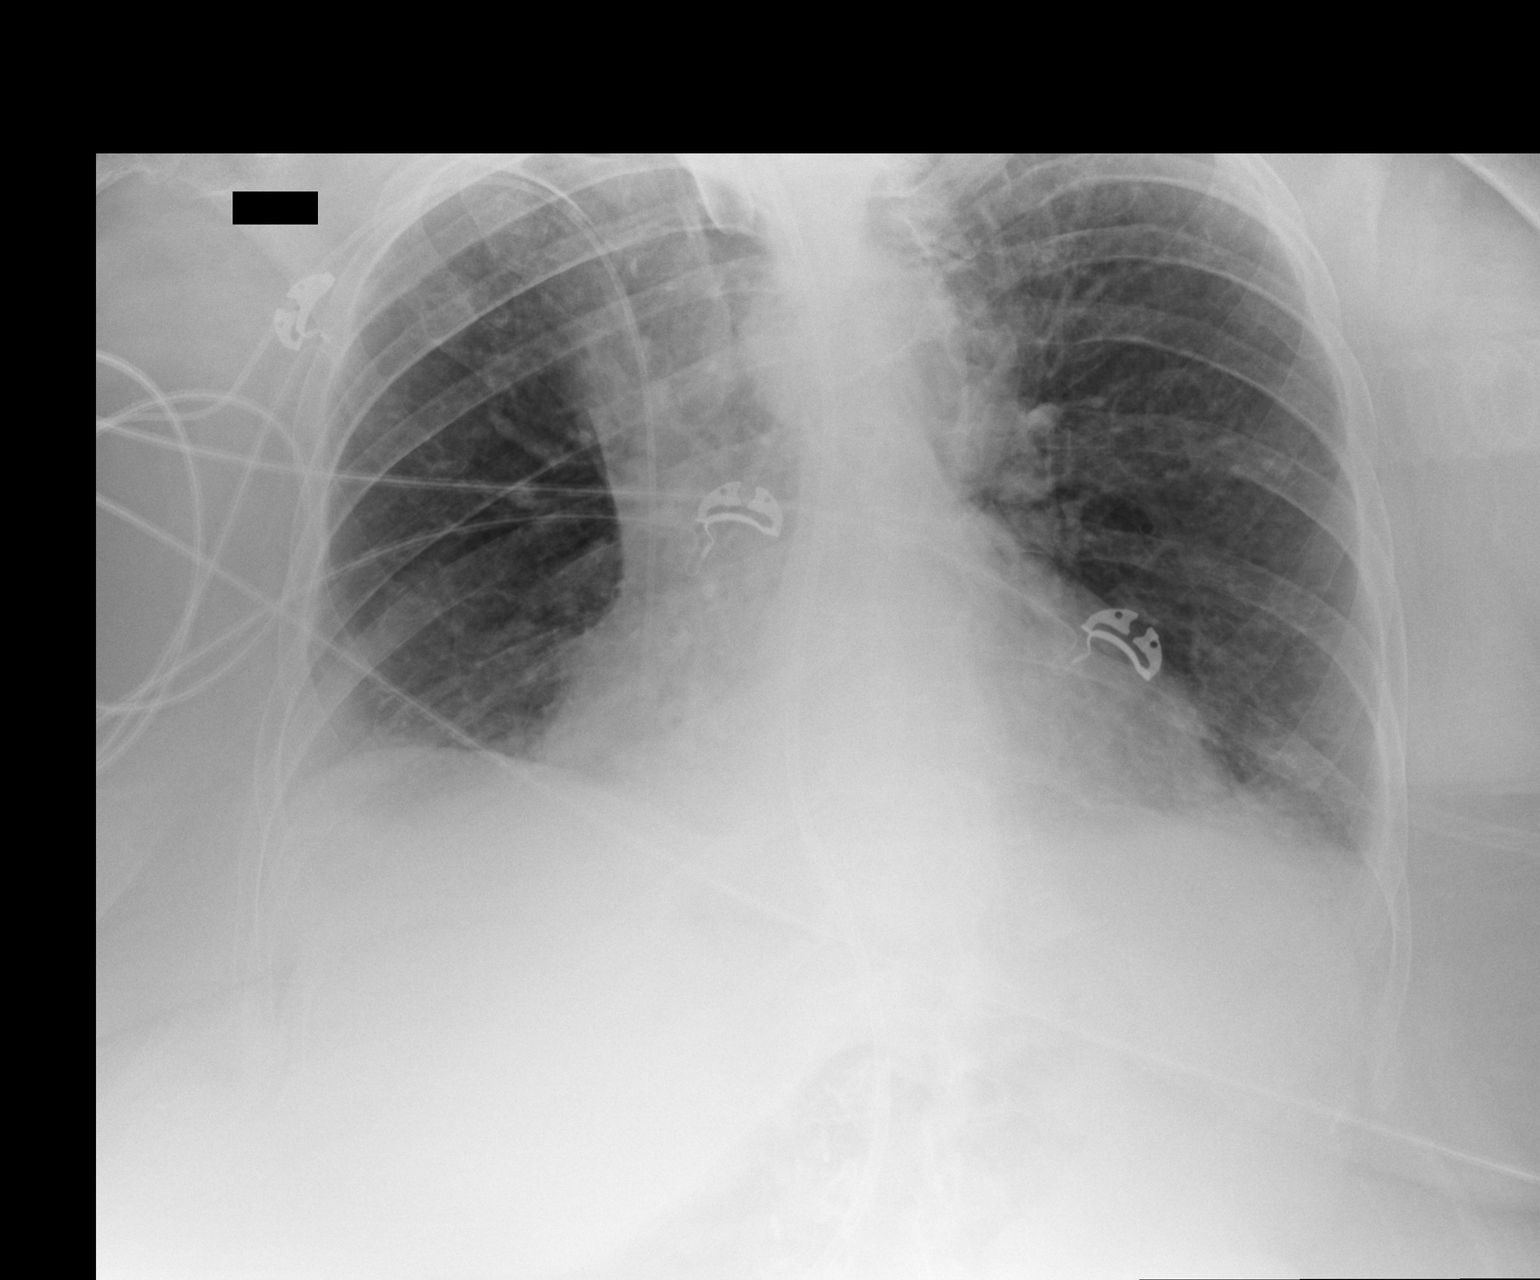

[1 of 1 positions shown; findings below may reference images not displayed]

FINDINGS: Lung base opacity has improved. There is mild persistent lung base
opacity that is likely atelectasis. No pulmonary edema.

Cardiac silhouette is mildly enlarged. Tracheostomy tube, enteric
tube and right PICC are stable and well positioned.
IMPRESSION: 1. Improved lung aeration.  Mild persistent basilar atelectasis.

## 2015-02-27 IMAGING — CR DG ABD PORTABLE 1V
1 series · 1 of 1 positions shown · non-contrast
Comparison: 04/18/2014

CLINICAL DATA: Abdominal pain

EXAM:
PORTABLE ABDOMEN - 1 VIEW

[AP]
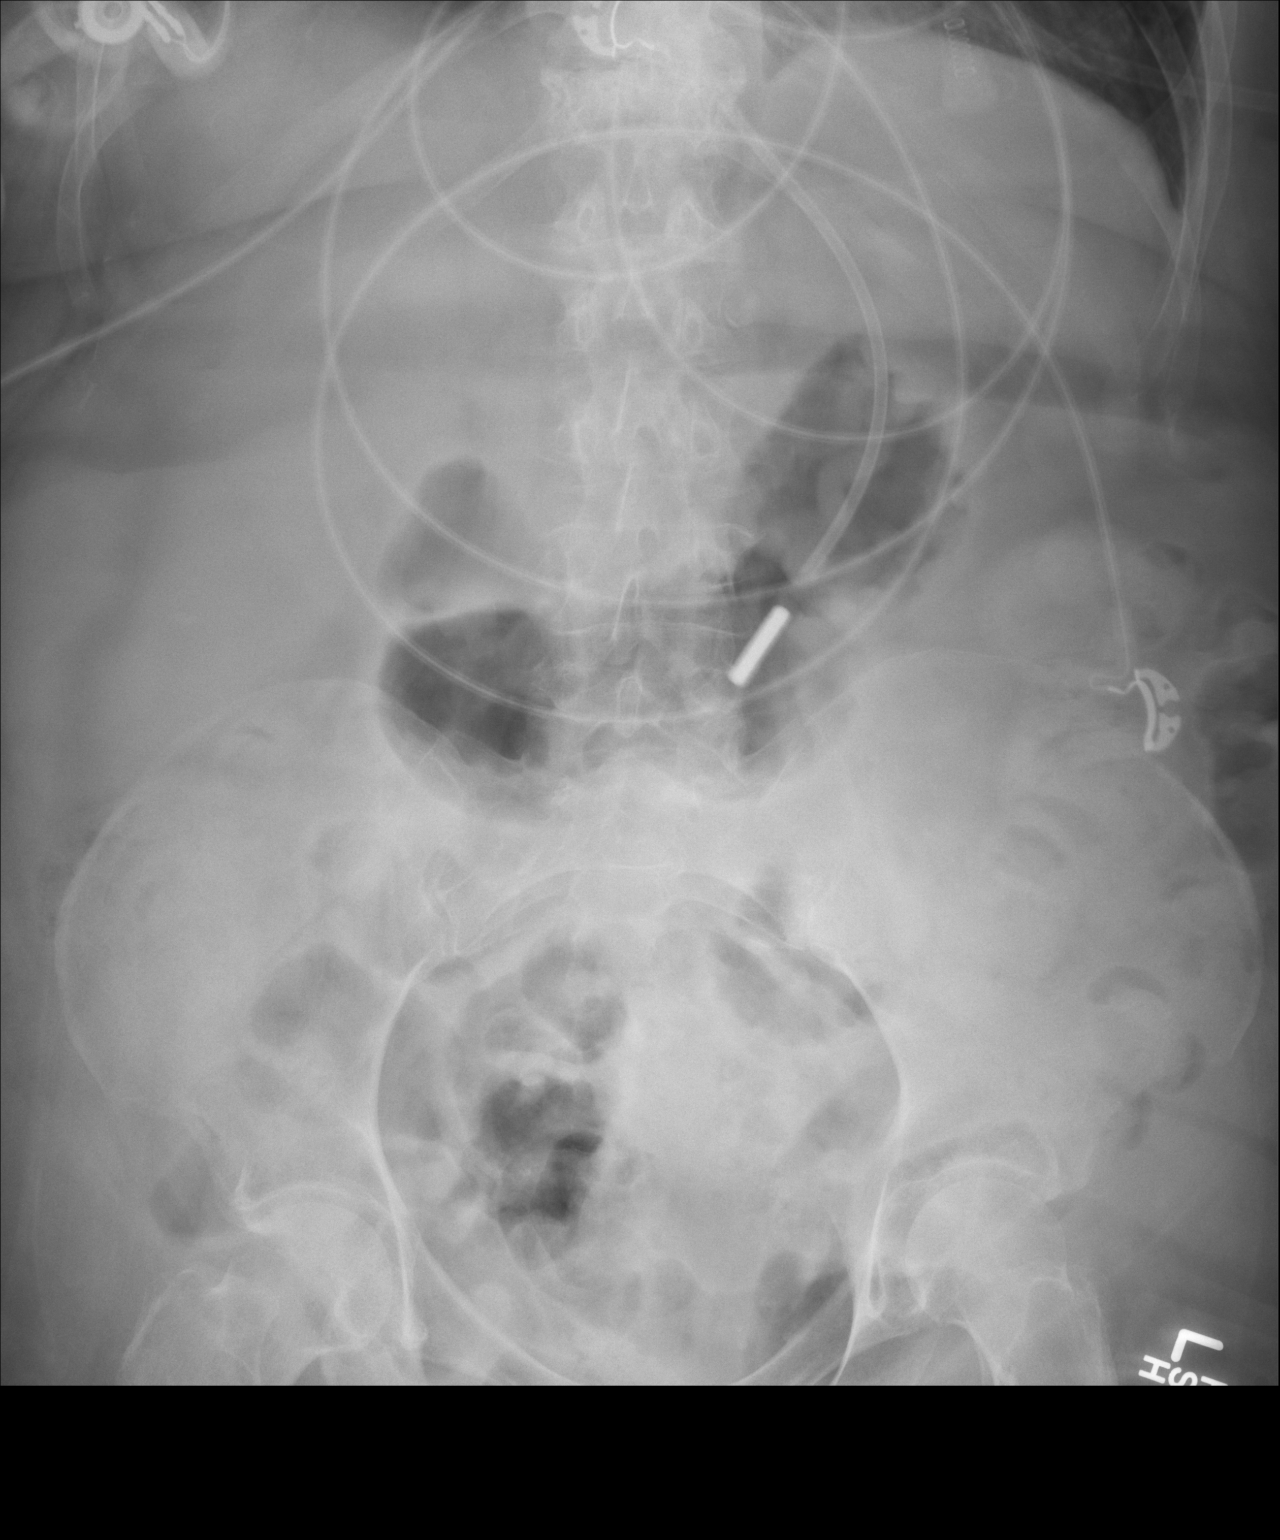

[1 of 1 positions shown; findings below may reference images not displayed]

FINDINGS: Feeding tube within the mid stomach body. Normal bowel gas pattern.
No acute osseous finding or abnormal calcification.
IMPRESSION: Feeding tube within the stomach.

## 2015-02-27 IMAGING — CR DG CHEST 1V PORT
1 series · 1 of 1 positions shown · non-contrast
Comparison: 04/19/2014

CLINICAL DATA: Followup atelectasis.

EXAM:
PORTABLE CHEST - 1 VIEW

[AP]
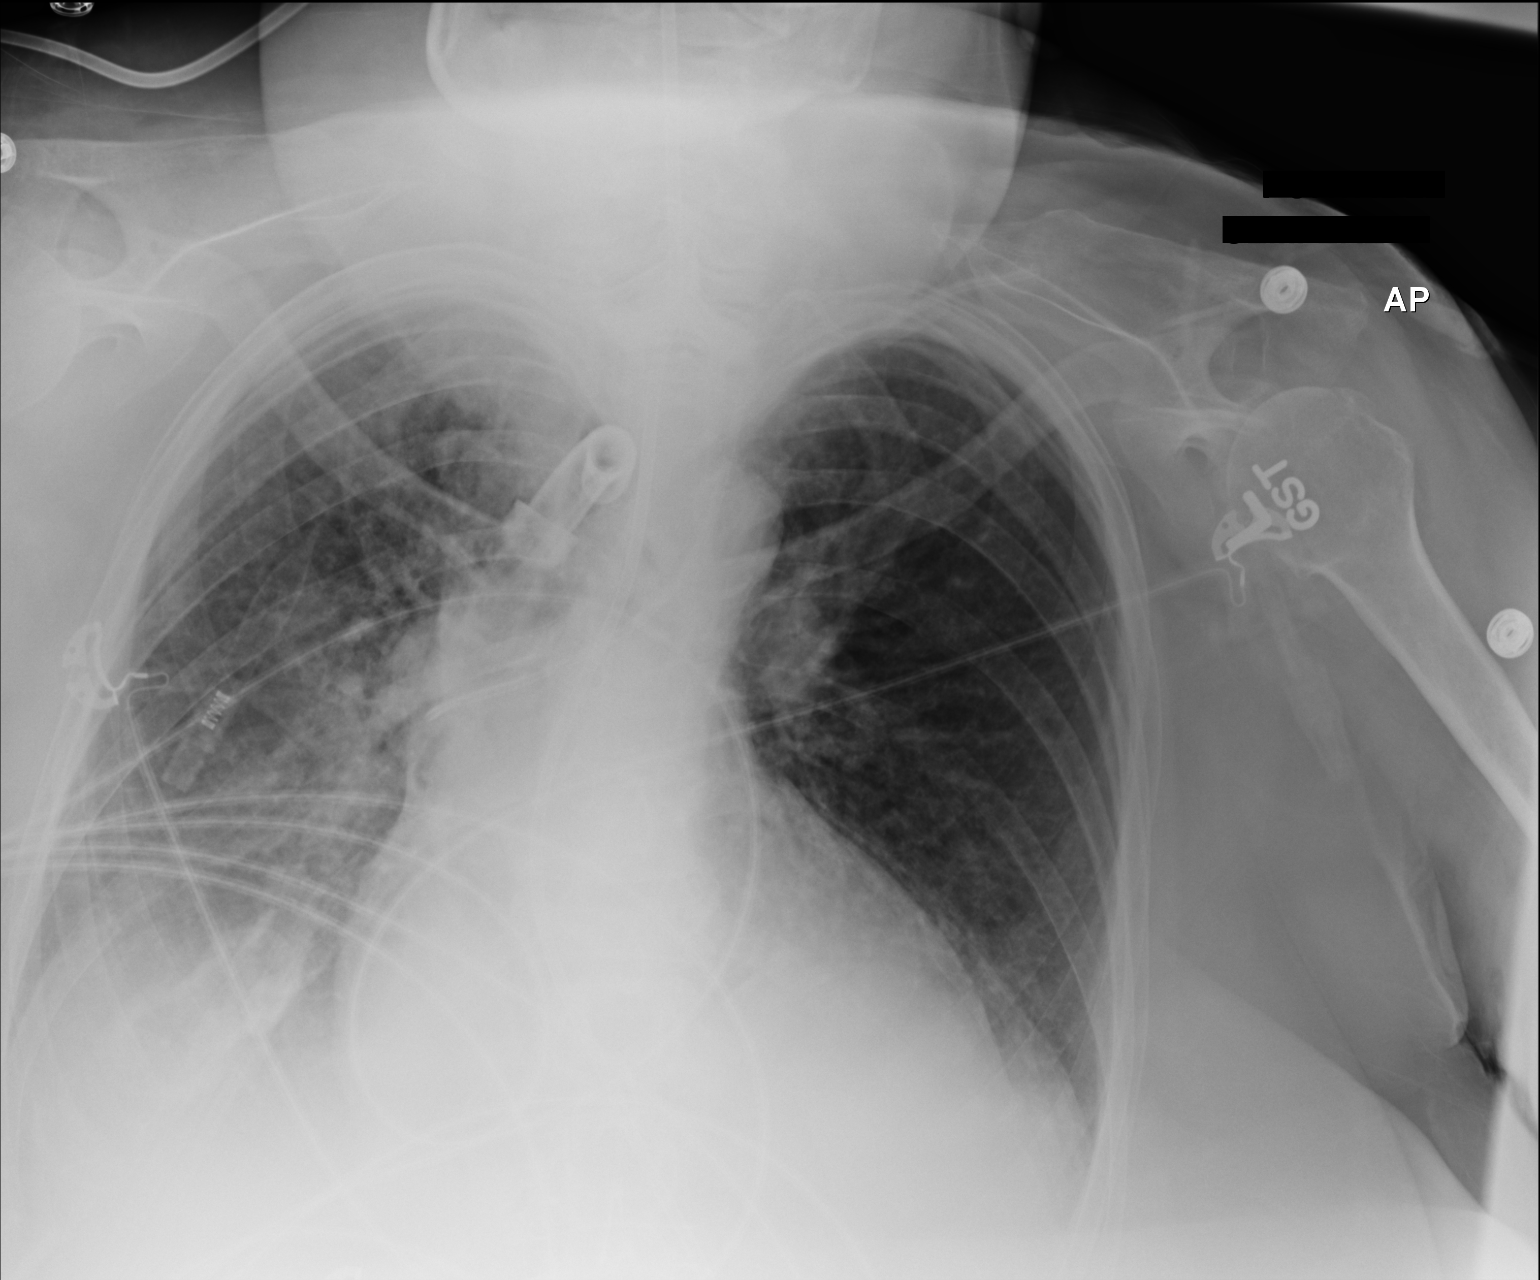

[1 of 1 positions shown; findings below may reference images not displayed]

FINDINGS: Tracheostomy tube remains seated. Interval removal right upper
extremity PICC. Left IJ central line has an unchanged appearance.
Enteric tube which crosses the diaphragm.

Mild cardiomegaly.  No change in mediastinal contours.

Improved aeration at the left base. Persistent hazy density at the
right base, obscuring the diaphragm.
IMPRESSION: 1. Right basilar opacity which could represent atelectasis or
infection.
2. Improved left lung aeration.

## 2015-03-02 IMAGING — XA IR PERC PLACEMENT GASTROSTOMY
1 series · 3 of 3 positions shown · non-contrast
Comparison: none

CLINICAL DATA: 57-year-old female with T6 fracture, cord
compression and new onset paraplegia. Patient underwent
neurosurgical decompression and has remained ventilator dependent
since that time. She has dysphagia and protein calorie malnutrition
and a percutaneous gastrostomy tube is warranted for enteral
nutrition and hydration.
TECHNIQUE: Informed consent was obtained from the patient following explanation
of the procedure, risks, benefits and alternatives. The patient
understands, agrees and consents for the procedure. All questions
were addressed. A time out was performed.

[Series 1: run · 3 of 3 slices shown]
[im 1/3]
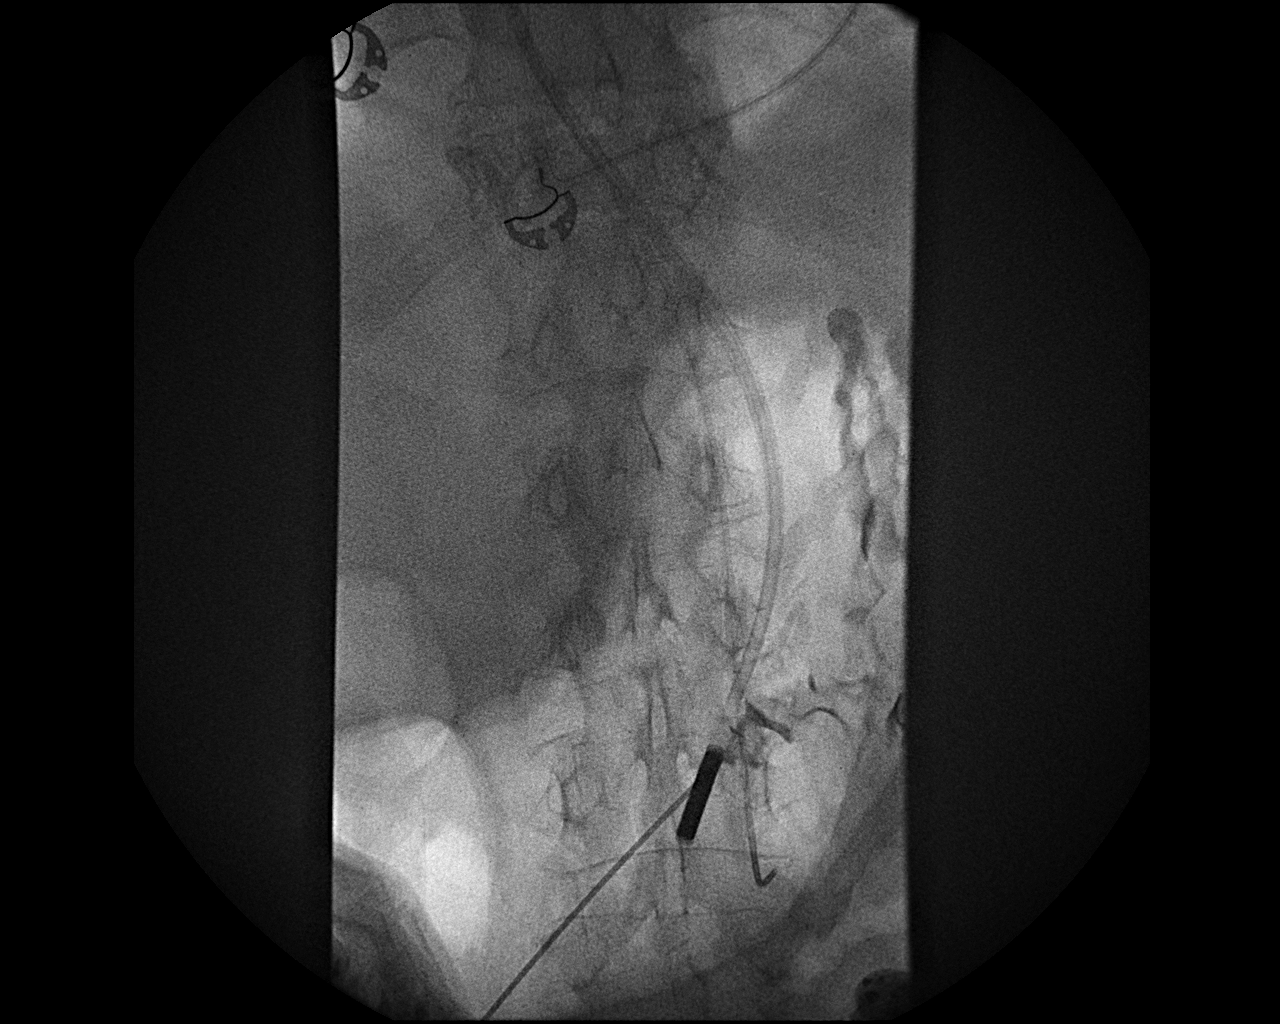
[im 2/3]
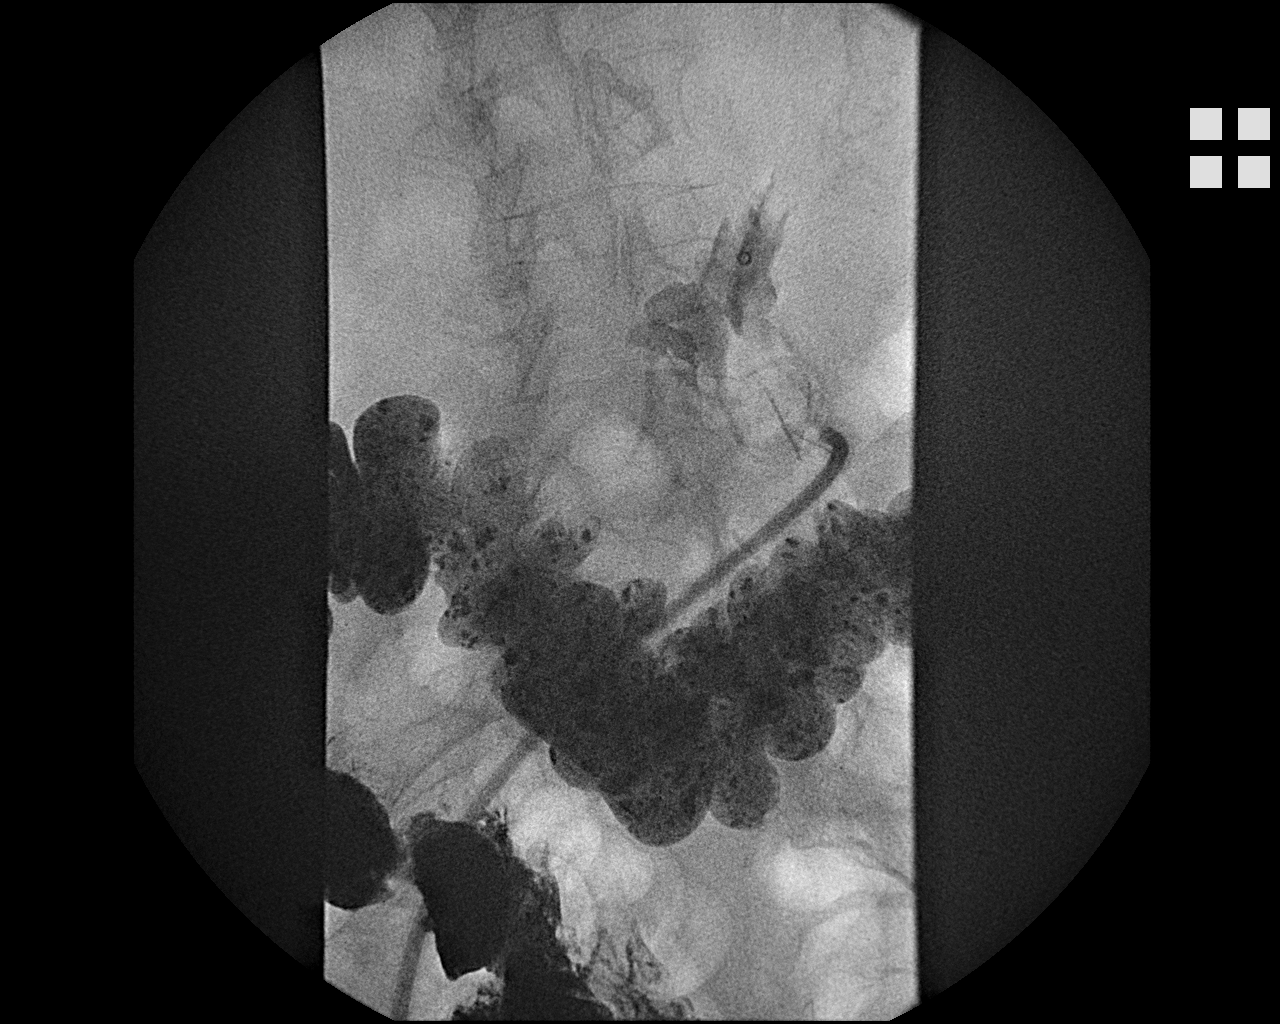
[im 3/3]
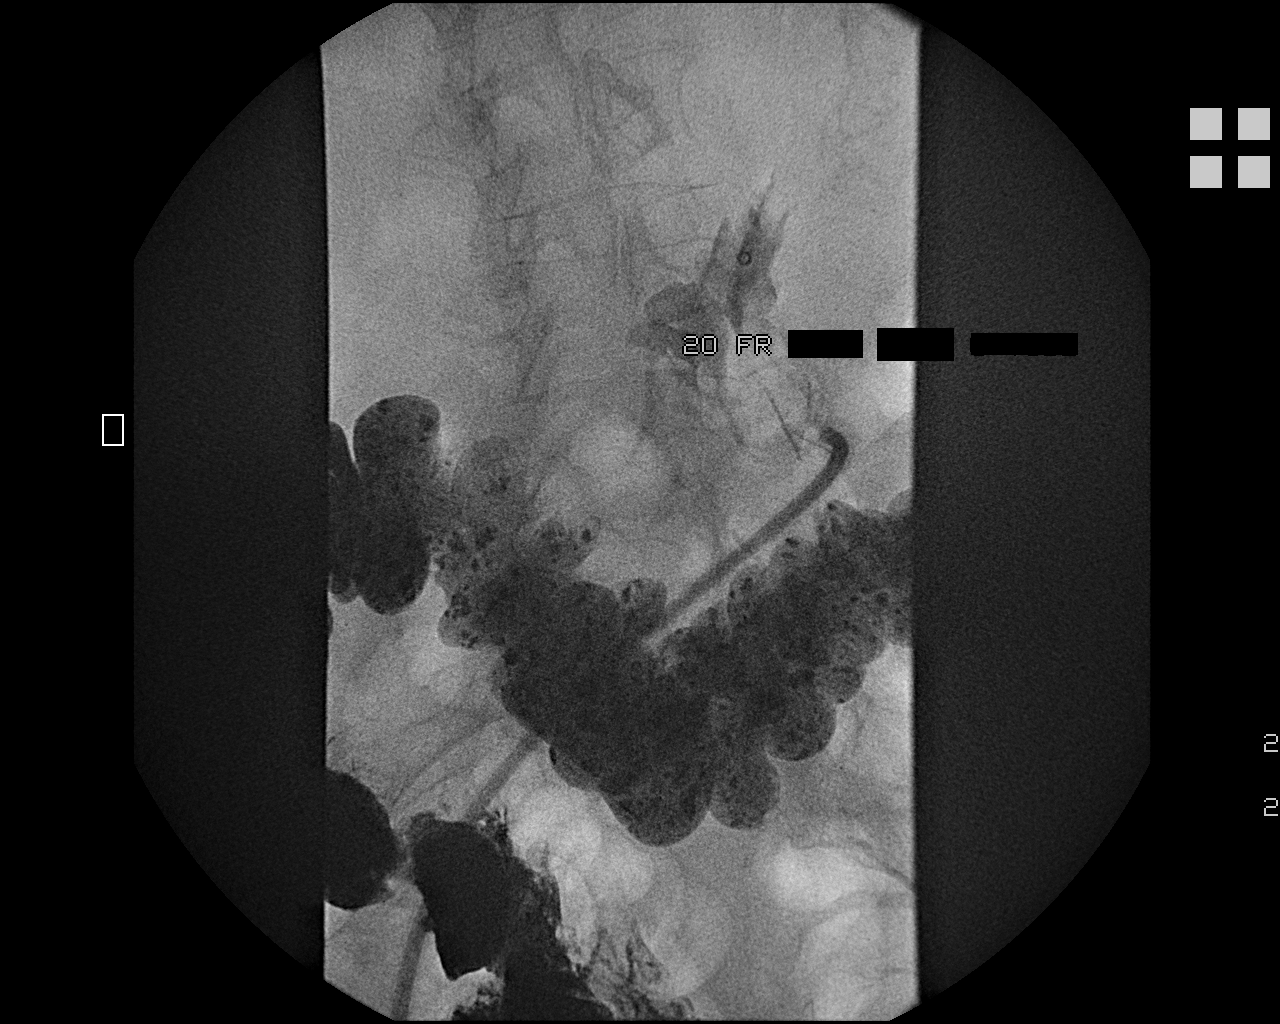

[3 of 3 positions shown; findings below may reference images not displayed]

EXAM:
PERC PLACEMENT GASTROSTOMY

Date: 04/26/2014

PROCEDURE:
1. Fluoroscopically guided placement of percutaneous pull-through
gastrostomy tube.

ANESTHESIA/SEDATION:
Moderate (conscious) sedation was used. 2 mg Versed, 50 mcg Fentanyl
were administered intravenously. The patient's vital signs were
monitored continuously by radiology nursing throughout the
procedure.

Sedation Time: 20 minutes

FLUOROSCOPY TIME:  5 minutes 20 seconds

CONTRAST:  15mL OMNIPAQUE IOHEXOL 300 MG/ML  SOLN

MEDICATIONS:
1 mg glucagon administered intravenously. Antibiotic prophylaxis was
not administered. The patient has been on vancomycin and her
upcoming dose is being held until a vancomycin trough level has been
drawn. In order not interfere with appropriate dosing, additional
antibiotic was not administered. The patient has an allergy to
penicillin.
Maximal barrier sterile technique utilized including caps, mask,
sterile gowns, sterile gloves, large sterile drape, hand hygiene,
and chlorhexadine skin prep.

An angled catheter was advanced over a wire under fluoroscopic
guidance through the nose, down the esophagus and into the body of
the stomach. The stomach was then insufflated with several 100 ml of
air. Fluoroscopy confirmed location of the gastric bubble, as well
as inferior displacement of the barium stained colon. Under direct
fluoroscopic guidance, a single T-tack was placed, and the anterior
gastric wall drawn up against the anterior abdominal wall.
Percutaneous access was then obtained into the mid gastric body with
an 18 gauge sheath needle. Aspiration of air, and injection of
contrast material under fluoroscopy confirmed needle placement.

An Amplatz wire was advanced in the gastric body and the access
needle exchanged for a 9-French vascular sheath. A snare device was
advanced through the vascular sheath and an Amplatz wire advanced
through the angled catheter. The Amplatz wire was successfully
snared and this was pulled up through the esophagus and out the
mouth. A 20-Yama Hb tube was then connected to
the snare and pulled through the mouth, down the esophagus, into the
stomach and out to the anterior abdominal wall. Hand injection of
contrast material confirmed intragastric location. The T-tack
retention suture was then cut. The pull through peg tube was then
secured with the external bumper and capped.

The patient will be observed for several hours with the newly placed
tube on low wall suction to evaluate for any post procedure
complication. The patient tolerated the procedure well, there is no
immediate complication.
IMPRESSION: Successful placement of a 20 French pull through gastrostomy tube.
